# Patient Record
Sex: Male | Born: 1995 | Race: Black or African American | Hispanic: No | Marital: Single | Smoking: Current every day smoker
Health system: Southern US, Community
[De-identification: ages and names within clinical notes are randomized; demographics above are authoritative.]

## PROBLEM LIST (undated history)

## (undated) DIAGNOSIS — F23 Brief psychotic disorder: Secondary | ICD-10-CM

---

## 2013-10-24 ENCOUNTER — Emergency Department (HOSPITAL_COMMUNITY)
Admission: EM | Admit: 2013-10-24 | Discharge: 2013-10-25 | Disposition: A | Discharge status: Other Acute Inpatient Hospital | Payer: Medicaid Other | Attending: Emergency Medicine | Admitting: Emergency Medicine

## 2013-10-24 ENCOUNTER — Encounter (HOSPITAL_COMMUNITY): Payer: Self-pay | Admitting: Emergency Medicine

## 2013-10-24 DIAGNOSIS — F209 Schizophrenia, unspecified: Secondary | ICD-10-CM | POA: Diagnosis not present

## 2013-10-24 DIAGNOSIS — R443 Hallucinations, unspecified: Secondary | ICD-10-CM | POA: Insufficient documentation

## 2013-10-24 DIAGNOSIS — IMO0002 Reserved for concepts with insufficient information to code with codable children: Secondary | ICD-10-CM | POA: Insufficient documentation

## 2013-10-24 DIAGNOSIS — F29 Unspecified psychosis not due to a substance or known physiological condition: Secondary | ICD-10-CM

## 2013-10-24 DIAGNOSIS — Z046 Encounter for general psychiatric examination, requested by authority: Secondary | ICD-10-CM | POA: Diagnosis present

## 2013-10-24 DIAGNOSIS — F911 Conduct disorder, childhood-onset type: Secondary | ICD-10-CM | POA: Diagnosis not present

## 2013-10-24 DIAGNOSIS — F489 Nonpsychotic mental disorder, unspecified: Secondary | ICD-10-CM | POA: Insufficient documentation

## 2013-10-24 DIAGNOSIS — F919 Conduct disorder, unspecified: Secondary | ICD-10-CM | POA: Insufficient documentation

## 2013-10-24 DIAGNOSIS — Z79899 Other long term (current) drug therapy: Secondary | ICD-10-CM | POA: Insufficient documentation

## 2013-10-24 DIAGNOSIS — R4689 Other symptoms and signs involving appearance and behavior: Secondary | ICD-10-CM

## 2013-10-24 HISTORY — DX: Brief psychotic disorder: F23

## 2013-10-24 LAB — RAPID URINE DRUG SCREEN, HOSP PERFORMED
Amphetamines: NOT DETECTED
BARBITURATES: NOT DETECTED
BENZODIAZEPINES: NOT DETECTED
Cocaine: NOT DETECTED
Opiates: NOT DETECTED
Tetrahydrocannabinol: NOT DETECTED

## 2013-10-24 LAB — CBC
HEMATOCRIT: 38 % — AB (ref 39.0–52.0)
Hemoglobin: 13.2 g/dL (ref 13.0–17.0)
MCH: 29.7 pg (ref 26.0–34.0)
MCHC: 34.7 g/dL (ref 30.0–36.0)
MCV: 85.6 fL (ref 78.0–100.0)
Platelets: 222 10*3/uL (ref 150–400)
RBC: 4.44 MIL/uL (ref 4.22–5.81)
RDW: 12.2 % (ref 11.5–15.5)
WBC: 5 10*3/uL (ref 4.0–10.5)

## 2013-10-24 MED ORDER — ZOLPIDEM TARTRATE 5 MG PO TABS: 5.0000 mg | ORAL_TABLET | Freq: Every evening | ORAL | Status: DC | PRN

## 2013-10-24 MED ORDER — ALUM & MAG HYDROXIDE-SIMETH 200-200-20 MG/5ML PO SUSP: 30.0000 mL | ORAL | Status: DC | PRN

## 2013-10-24 MED ORDER — ACETAMINOPHEN 325 MG PO TABS: 650.0000 mg | ORAL_TABLET | ORAL | Status: DC | PRN

## 2013-10-24 MED ORDER — NICOTINE 21 MG/24HR TD PT24: 21.0000 mg | MEDICATED_PATCH | Freq: Every day | TRANSDERMAL | Status: DC

## 2013-10-24 MED ORDER — IBUPROFEN 200 MG PO TABS: 600.0000 mg | ORAL_TABLET | Freq: Three times a day (TID) | ORAL | Status: DC | PRN

## 2013-10-24 MED ORDER — ONDANSETRON HCL 4 MG PO TABS
4.0000 mg | ORAL_TABLET | Freq: Three times a day (TID) | ORAL | Status: DC | PRN
Start: 2013-10-24 — End: 2013-10-25

## 2013-10-24 MED ORDER — LORAZEPAM 1 MG PO TABS: 1.0000 mg | ORAL_TABLET | Freq: Three times a day (TID) | ORAL | Status: DC | PRN
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
## 2013-10-25 ENCOUNTER — Encounter (HOSPITAL_COMMUNITY): Payer: Self-pay | Admitting: *Deleted

## 2013-10-25 ENCOUNTER — Inpatient Hospital Stay (HOSPITAL_COMMUNITY)
Admission: AD | Admit: 2013-10-25 | Discharge: 2013-11-01 | DRG: 885 | Disposition: A | Discharge status: Home / Self Care | Payer: Medicaid Other | Source: Intra-hospital | Attending: Psychiatry | Admitting: Psychiatry

## 2013-10-25 DIAGNOSIS — F3289 Other specified depressive episodes: Secondary | ICD-10-CM | POA: Diagnosis present

## 2013-10-25 DIAGNOSIS — F209 Schizophrenia, unspecified: Secondary | ICD-10-CM | POA: Diagnosis present

## 2013-10-25 DIAGNOSIS — F29 Unspecified psychosis not due to a substance or known physiological condition: Secondary | ICD-10-CM | POA: Diagnosis present

## 2013-10-25 DIAGNOSIS — F911 Conduct disorder, childhood-onset type: Secondary | ICD-10-CM | POA: Diagnosis not present

## 2013-10-25 DIAGNOSIS — F200 Paranoid schizophrenia: Secondary | ICD-10-CM

## 2013-10-25 DIAGNOSIS — G4700 Insomnia, unspecified: Secondary | ICD-10-CM | POA: Diagnosis present

## 2013-10-25 DIAGNOSIS — F329 Major depressive disorder, single episode, unspecified: Secondary | ICD-10-CM | POA: Diagnosis present

## 2013-10-25 LAB — SALICYLATE LEVEL: Salicylate Lvl: <2 mg/dL — ABNORMAL LOW (ref 2.8–20.0)

## 2013-10-25 LAB — COMPREHENSIVE METABOLIC PANEL
ALT: 17 U/L (ref 0–53)
ANION GAP: 13 (ref 5–15)
AST: 26 U/L (ref 0–37)
Albumin: 4.4 g/dL (ref 3.5–5.2)
Alkaline Phosphatase: 71 U/L (ref 39–117)
BILIRUBIN TOTAL: 0.2 mg/dL — AB (ref 0.3–1.2)
BUN: 11 mg/dL (ref 6–23)
CO2: 27 meq/L (ref 19–32)
CREATININE: 0.98 mg/dL (ref 0.50–1.35)
Calcium: 9.6 mg/dL (ref 8.4–10.5)
Chloride: 103 mEq/L (ref 96–112)
GFR calc Af Amer: >90 mL/min (ref >90)
Glucose, Bld: 86 mg/dL (ref 70–99)
Potassium: 3.9 mEq/L (ref 3.7–5.3)
Sodium: 143 mEq/L (ref 137–147)
Total Protein: 8 g/dL (ref 6.0–8.3)

## 2013-10-25 LAB — ACETAMINOPHEN LEVEL

## 2013-10-25 LAB — ETHANOL

## 2013-10-25 MED ORDER — ALUM & MAG HYDROXIDE-SIMETH 200-200-20 MG/5ML PO SUSP: 30.0000 mL | ORAL | Status: DC | PRN

## 2013-10-25 MED ORDER — NICOTINE 21 MG/24HR TD PT24
21.0000 mg | MEDICATED_PATCH | Freq: Every day | TRANSDERMAL | Status: DC
  Filled 2013-10-25 (×5): qty 1

## 2013-10-25 MED ORDER — LORAZEPAM 1 MG PO TABS
1.0000 mg | ORAL_TABLET | Freq: Three times a day (TID) | ORAL | Status: DC | PRN
Start: 2013-10-25 — End: 2013-10-26
  Filled 2013-10-25: qty 1

## 2013-10-25 MED ORDER — MAGNESIUM HYDROXIDE 400 MG/5ML PO SUSP: 30.0000 mL | Freq: Every day | ORAL | Status: DC | PRN

## 2013-10-25 MED ORDER — ACETAMINOPHEN 325 MG PO TABS: 650.0000 mg | ORAL_TABLET | ORAL | Status: DC | PRN

## 2013-10-25 MED ORDER — IBUPROFEN 200 MG PO TABS
600.0000 mg | ORAL_TABLET | Freq: Three times a day (TID) | ORAL | Status: DC | PRN
  Filled 2013-10-25: qty 3

## 2013-10-25 MED ORDER — ONDANSETRON HCL 4 MG PO TABS: 4.0000 mg | ORAL_TABLET | Freq: Three times a day (TID) | ORAL | Status: DC | PRN

## 2013-10-25 MED ORDER — ACETAMINOPHEN 325 MG PO TABS: 650.0000 mg | ORAL_TABLET | Freq: Four times a day (QID) | ORAL | Status: DC | PRN

## 2013-10-25 MED ORDER — ZOLPIDEM TARTRATE 5 MG PO TABS: 5.0000 mg | ORAL_TABLET | Freq: Every evening | ORAL | Status: DC | PRN

## 2013-10-25 MED ORDER — LURASIDONE HCL 40 MG PO TABS
40.0000 mg | ORAL_TABLET | Freq: Every day | ORAL | Status: DC
  Administered 2013-10-25: 40 mg via ORAL
  Filled 2013-10-25 (×2): qty 1

## 2013-10-25 MED ORDER — LURASIDONE HCL 40 MG PO TABS
40.0000 mg | ORAL_TABLET | Freq: Every day | ORAL | Status: DC
  Filled 2013-10-25 (×3): qty 1

## 2013-10-25 NOTE — ED Provider Notes (Signed)
Medical screening examination/treatment/procedure(s) were performed by non-physician practitioner and as supervising physician I was immediately available for consultation/collaboration.   EKG Interpretation None       Derwood KaplanAnkit Auburn Hester, MD 10/25/13 2221
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
## 2013-10-25 NOTE — ED Notes (Signed)
[facility] Expected date:  Expected time:  Means of arrival:  Comments: Hold for Hoffer, T

## 2013-10-25 NOTE — Progress Notes (Signed)
18 year old male pt admitted on involuntary basis. Pt, on admission, does endorse auditory hallucinations and states that he has one voice in his head and it says random stuff to him. Pt does endorse being on medications but did state that he does not feel like they help and also is unable to tell who prescribes him the medication. Pt reports that he lives at home with his mother and will go back there after discharge. Pt denies any alcohol or drug use and denies any SI on admission but able to contract for safety on the unit. Pt was oriented to unit and safety maintained.

## 2013-10-25 NOTE — Tx Team (Signed)
Initial Interdisciplinary Treatment Plan   PATIENT STRESSORS: Medication change or noncompliance   PROBLEM LIST: Problem List/Patient Goals Date to be addressed Date deferred Reason deferred Estimated date of resolution  Auditory hallucinations 10/25/13                                                      DISCHARGE CRITERIA:  Ability to meet basic life and health needs Verbal commitment to aftercare and medication compliance  PRELIMINARY DISCHARGE PLAN: Return to previous living arrangement  PATIENT/FAMIILY INVOLVEMENT: This treatment plan has been presented to and reviewed with the patient, [name] Love, and/or family member, .  The patient and family have been given the opportunity to ask questions and make suggestions.  [name] Love, [name] Love 10/25/2013, 8:36 PM

## 2013-10-25 NOTE — ED Notes (Signed)
[facility]  [facility]

## 2013-10-25 NOTE — ED Notes (Signed)
Patient appears anxious. Standing in corner of room with snack on the floor. Minimal speech and eye contact. Patient answers questions with nods. Denies SI, HI, VH. Patient has passive AH. Patient does not sleep well. Denies feelings of anxiety and depression.   Encouragement offered. Offered patient use of his bed, chair and bedside table. Patient watching tv, continues to stand in the corner with snack on the floor.  Q 15 safety checks in place.

## 2013-10-25 NOTE — BH Assessment (Signed)
10-25-2013  Dr. Sharyon Cableaylor/Conrad, NP recommends in patient treatment. [facility]

## 2013-10-25 NOTE — ED Notes (Signed)
[facility] to [employer] [facility].  [facility].
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
## 2013-10-26 ENCOUNTER — Encounter (HOSPITAL_COMMUNITY): Payer: Self-pay | Admitting: Psychiatry

## 2013-10-26 MED ORDER — HALOPERIDOL 5 MG PO TABS: 5.0000 mg | ORAL_TABLET | Freq: Two times a day (BID) | ORAL | Status: DC

## 2013-10-26 MED ORDER — HALOPERIDOL LACTATE 5 MG/ML IJ SOLN
5.0000 mg | Freq: Four times a day (QID) | INTRAMUSCULAR | Status: DC | PRN
  Filled 2013-10-26: qty 1

## 2013-10-26 MED ORDER — DIPHENHYDRAMINE HCL 25 MG PO CAPS
25.0000 mg | ORAL_CAPSULE | Freq: Four times a day (QID) | ORAL | Status: DC | PRN
  Filled 2013-10-26: qty 1

## 2013-10-26 MED ORDER — LORAZEPAM 1 MG PO TABS
1.0000 mg | ORAL_TABLET | Freq: Four times a day (QID) | ORAL | Status: DC | PRN
  Administered 2013-10-28: 1 mg via ORAL
  Filled 2013-10-26: qty 1

## 2013-10-26 MED ORDER — DIPHENHYDRAMINE HCL 50 MG/ML IJ SOLN: 25.0000 mg | Freq: Two times a day (BID) | INTRAMUSCULAR | Status: DC

## 2013-10-26 MED ORDER — DIPHENHYDRAMINE HCL 25 MG PO CAPS
25.0000 mg | ORAL_CAPSULE | Freq: Two times a day (BID) | ORAL | Status: DC
  Administered 2013-10-26 – 2013-11-01 (×12): 25 mg via ORAL
  Filled 2013-10-26: qty 1
  Filled 2013-10-26: qty 28
  Filled 2013-10-26 (×10): qty 1
  Filled 2013-10-26: qty 28
  Filled 2013-10-26 (×3): qty 1

## 2013-10-26 MED ORDER — OLANZAPINE 5 MG PO TBDP
5.0000 mg | ORAL_TABLET | Freq: Two times a day (BID) | ORAL | Status: DC
  Filled 2013-10-26 (×2): qty 1

## 2013-10-26 MED ORDER — HALOPERIDOL 5 MG PO TABS
5.0000 mg | ORAL_TABLET | Freq: Two times a day (BID) | ORAL | Status: DC
  Administered 2013-10-26 – 2013-11-01 (×12): 5 mg via ORAL
  Filled 2013-10-26: qty 1
  Filled 2013-10-26: qty 28
  Filled 2013-10-26 (×2): qty 1
  Filled 2013-10-26: qty 28
  Filled 2013-10-26 (×14): qty 1

## 2013-10-26 MED ORDER — HALOPERIDOL LACTATE 5 MG/ML IJ SOLN
5.0000 mg | Freq: Two times a day (BID) | INTRAMUSCULAR | Status: DC
  Filled 2013-10-26 (×17): qty 1

## 2013-10-26 MED ORDER — DIPHENHYDRAMINE HCL 50 MG/ML IJ SOLN
25.0000 mg | Freq: Two times a day (BID) | INTRAMUSCULAR | Status: DC
  Filled 2013-10-26 (×16): qty 0.5

## 2013-10-26 MED ORDER — HALOPERIDOL LACTATE 5 MG/ML IJ SOLN: 5.0000 mg | Freq: Three times a day (TID) | INTRAMUSCULAR | Status: DC | PRN

## 2013-10-26 MED ORDER — OLANZAPINE 10 MG IM SOLR
5.0000 mg | Freq: Two times a day (BID) | INTRAMUSCULAR | Status: DC
  Filled 2013-10-26 (×2): qty 10

## 2013-10-26 MED ORDER — HALOPERIDOL 5 MG PO TABS
5.0000 mg | ORAL_TABLET | Freq: Four times a day (QID) | ORAL | Status: DC | PRN
  Administered 2013-10-26 – 2013-10-28 (×5): 5 mg via ORAL
  Filled 2013-10-26 (×2): qty 1

## 2013-10-26 MED ORDER — LORAZEPAM 2 MG/ML IJ SOLN: 1.0000 mg | Freq: Four times a day (QID) | INTRAMUSCULAR | Status: DC | PRN

## 2013-10-26 MED ORDER — DIPHENHYDRAMINE HCL 50 MG/ML IJ SOLN
25.0000 mg | Freq: Four times a day (QID) | INTRAMUSCULAR | Status: DC | PRN
  Filled 2013-10-26: qty 1

## 2013-10-26 MED ORDER — DIPHENHYDRAMINE HCL 25 MG PO CAPS: 25.0000 mg | ORAL_CAPSULE | Freq: Two times a day (BID) | ORAL | Status: DC

## 2013-10-26 MED ORDER — HALOPERIDOL LACTATE 5 MG/ML IJ SOLN: 5.0000 mg | Freq: Two times a day (BID) | INTRAMUSCULAR | Status: DC

## 2013-10-26 MED ORDER — OLANZAPINE 10 MG IM SOLR: 2.5000 mg | Freq: Two times a day (BID) | INTRAMUSCULAR | Status: DC | PRN

## 2013-10-26 MED ORDER — OLANZAPINE 10 MG PO TBDP: 5.0000 mg | ORAL_TABLET | Freq: Two times a day (BID) | ORAL | Status: DC | PRN

## 2013-10-26 MED ORDER — TRAZODONE HCL 50 MG PO TABS
50.0000 mg | ORAL_TABLET | Freq: Every day | ORAL | Status: DC
  Administered 2013-10-27 – 2013-10-29 (×3): 50 mg via ORAL
  Filled 2013-10-26 (×9): qty 1

## 2013-10-26 MED ORDER — HALOPERIDOL 5 MG PO TABS: 5.0000 mg | ORAL_TABLET | Freq: Three times a day (TID) | ORAL | Status: DC | PRN

## 2013-10-26 NOTE — Progress Notes (Signed)
Psychoeducational Group Note  Date:  10/26/2013 Time:  2000  Group Topic/Focus:  Wrap-Up Group:   The focus of this group is to help patients review their daily goal of treatment and discuss progress on daily workbooks.  Participation Level: Did Not Attend  Participation Quality:  Not Applicable  Affect:  Not Applicable  Cognitive:  Not Applicable  Insight:  Not Applicable  Engagement in Group: Not Applicable  Additional Comments:  The patient did not attend since he was lying awake in his bed.   Hazle CocaGOODMAN, Kendall Arnell S 10/26/2013, 10:27 PM

## 2013-10-26 NOTE — Progress Notes (Signed)
Patient ID: [name] Love, male   DOB: Jul 29, 1995, 18 y.o.   MRN: [id number] D. Patient presents with flat affect, he is very guarded with wide eyed stare and appears very preoccupied, responding to internal stimuli. [name] Love would not answer any questions with words today with writer, only shaking his head. When writer offered am medications he refused. He has been noted to pace in the hallway, appearing scared and staring into the distance. A. Notified Dr. [name] and treatment team of above information. Orders received for medications, and patient did accept with moderate staff encouragement. It was noted that during med administration pt attempted to cheek pills several times. R. Patient has been isolative and guarded but is in no acute distress at this time. No further voiced concerns. Will continue to monitor q 15 minutes for safety.

## 2013-10-26 NOTE — [facility] Group Notes (Signed)
[facility]   10/26/2013 10:34 AM  Participation Quality:  Did not attend    [place], Nyomie Ehrlich B

## 2013-10-26 NOTE — [facility] Group Notes (Signed)
[facility] LCSW Group Therapy  10/26/2013  1:05 PM  Type of Therapy:  Group therapy  Participation Level:  Invited  Chose to not attend  Summary of Progress/Problems:  Chaplain was here to lead a group on themes of hope and courage.  Daryel Geraldorth, Leonore Frankson B 10/26/2013 1:31 PM

## 2013-10-26 NOTE — Progress Notes (Signed)
Pt did not attend group this evening.  
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
## 2013-10-26 NOTE — [facility] Suicide Risk Assessment (Signed)
   Nursing information obtained from:   chart review and staff Demographic factors:   Patient is a 18 year old AAM,who is single ,reportedly has been living with mother and siblings ,was brought in for bizarre behavior as well as talking in third person. Has a hx of schizophrenia. Current Mental Status:   Patient appears to be disorganized ,exhibiting bizarre gestures as well as inappropriate laughter. Patient refuses to talk or explain his behavior and is not cooperative with evaluation or treatment. Loss Factors:   unknown Historical Factors:   History of schizophrenia,possible non compliance on medications.(Will need to obtain collateral information from mother -unable to reach her at this time) Risk Reduction Factors:   Access to treatment as well as social support. Total Time spent with patient: 1 hour  CLINICAL FACTORS:   Schizophrenia:   Less than 20 years old  Psychiatric Specialty Exam: Physical Exam  ROS  Blood pressure 124/59, pulse 79, temperature 98 F (36.7 C), temperature source Oral, resp. rate 16, height 5\' 10"  (1.778 m), weight 71.668 kg (158 lb).Body mass index is 22.67 kg/(m^2).  General Appearance: Disheveled  Eye Contact::  appears to be staring at times  Speech:  Slow and sparse,non spontaneous,speaks in monosyllables  Volume:  Decreased  Mood:  Dysphoric  Affect:  Inappropriate and Labile  Thought Process:  Disorganized, Irrelevant and has inappropriate gestures of his hands as well as staring,inappropiriate laughter as well as bizarre behavior  Orientation:  Full (Time, Place, and Person)  Thought Content:  Hallucinations: responding to internal stimuli,disorganized  Suicidal Thoughts:  No  Homicidal Thoughts:  No  Memory:  Immediate;   Poor Recent;   Fair Remote;   Poor  Judgement:  Impaired  Insight:  Lacking  Psychomotor Activity:  Decreased  Concentration:  Poor  Recall:  Poor  Fund of Knowledge:Poor  Language: Fair  Akathisia:  No    AIMS (if  indicated):   0  Assets:  Social Support  Sleep:  Number of Hours: 6.75   Musculoskeletal: Strength & Muscle Tone: within normal limits Gait & Station: normal Patient leans: N/A  COGNITIVE FEATURES THAT CONTRIBUTE TO RISK:  Closed-mindedness Thought constriction (tunnel vision)    SUICIDE RISK:   Minimal:  Denies Suicidal ideation. There are no identifiable plans, no associated intent, mild dysphoria and related symptoms, good self-control (both objective and subjective assessment), few other risk factors, and identifiable protective factors, including available and accessible social support.  PLAN OF CARE:  I certify that inpatient services furnished can reasonably be expected to improve the patient's condition.  Nesiah Jump 10/26/2013, 11:52 AM

## 2013-10-26 NOTE — H&P (Signed)
Psychiatric Admission Assessment Adult  Patient Identification:  [name] Love Date of Evaluation:  10/26/2013 Chief Complaint:  "I am here for behavior".  History of Present Illness: Patient is a 34 y old AAM, who is single, high school graduate ,reportedly lives with mother as well as siblings,unemployed ,was brought in IVC ed for bizarre behavior as well as talking in 72rd person. Patient per IVC documentation has a history of schizophrenia , was on latuda 20 mg ,but became disorganized and hence had to be brought in. Patient denies any AH/VH ,reports he is here for behavior problems. He is unable to give details about who brought him here and says it is Agricultural engineer . Patient throughout the interview appeared to be inappropriate ,found staring at times,had bizarre gestures of his hands and was unable to answer questions being asked, laughing at times for no reason, avoiding eye contact. Patient reports being a high school graduate and wanting to go to college to be a Brewing technologist. Reports he has never been hospitalizaed before but is unreliable with detailed past history. Per collateral obtained from mother (Per report from [place]) - Patient was in his usual state of health ,when three weeks ago he was found to be disorganized ,not making any sense and he was taken to the mental health center where he was started on latuda as well as given a diagnosis of schizophrenia. 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   Elements:  Location:  psychosis. Quality:  bizare gestures,inappropriate laughter ,disorganized ,behavioral issues. Severity:  severe . Timing:  past 3 weeks ,all day. Duration:  past 3 weeks.. Associated Signs/Synptoms: Depression Symptoms:  patient denies (Hypo) Manic Symptoms:none Anxiety Symptoms:  Patient denies Psychotic Symptoms:  Hallucinations: responds to internal stimuli,staring ,bizarre  gestures,inappropriate laughter Paranoia, PTSD Symptoms: Negative Total Time spent with patient: 1 hour  Psychiatric Specialty Exam: Physical Exam  Constitutional: He appears well-developed and well-nourished.  HENT:  Head: Normocephalic and atraumatic.  Eyes: Conjunctivae and EOM are normal. Pupils are equal, round, and reactive to light.  Neck: Normal range of motion. Neck supple.  Cardiovascular: Normal rate and regular rhythm.   Respiratory: Effort normal and breath sounds normal.  GI: Soft. Bowel sounds are normal.  Musculoskeletal: Normal range of motion.  Neurological: He is alert.  Skin: Skin is warm.  Psychiatric: His affect is labile and inappropriate. His speech is delayed. He is slowed and withdrawn. Thought content is paranoid. Cognition and memory are impaired. He expresses inappropriate judgment. He is noncommunicative.    Review of Systems  Constitutional: Negative.   HENT: Negative.   Eyes: Negative.   Gastrointestinal: Negative.   Neurological: Negative.   Psychiatric/Behavioral: Positive for hallucinations (responding to internal stimuli,abnormal bizarre gestures,staring,inappropriate laughter).    Blood pressure 124/59, pulse 79, temperature 98 F (36.7 C), temperature source Oral, resp. rate 16, height 5' 10"  (1.778 m), weight 71.668 kg (158 lb).Body mass index is 22.67 kg/(m^2).  General Appearance: Disheveled and Guarded  Eye Contact::  Poor  Speech:  Blocked and Slow  Volume:  Decreased  Mood:  Dysphoric  Affect:  Inappropriate and Labile  Thought Process:  Disorganized and Irrelevant  Orientation:  Full (Time, Place, and Person)  Thought Content:  Hallucinations: responds to internal stimuli and Paranoid Ideation  Suicidal Thoughts:  No  Homicidal Thoughts:  No  Memory:  Immediate;   Poor Recent;   Fair Remote;   Poor  Judgement:  Impaired  Insight:  Lacking  Psychomotor Activity:  Decreased  Concentration:  Poor  Recall:  Poor  Fund of  Knowledge:Fair  Language: Fair  Akathisia:  No    AIMS (if indicated):     Assets:  Social Support  Sleep:  Number of Hours: 6.75    Musculoskeletal: Strength & Muscle Tone: within normal limits Gait & Station: normal Patient leans: N/A  Past Psychiatric History: Diagnosis:Psychosis (diagnosed with schizophrenia 3 weeks ago per mom). No past history.  Hospitalizations:none  Outpatient Care:mental health center -started on latuda   Substance Abuse Care:none  Self-Mutilation:denies  Suicidal Attempts:denies  Violent Behaviors:denies   Past Medical History:   Past Medical History  Diagnosis Date  . Schizophrenia, acute    None. Allergies:  No Known Allergies PTA Medications: Prescriptions prior to admission  Medication Sig Dispense Refill  . lurasidone (LATUDA) 40 MG TABS tablet Take 20 mg by mouth daily with breakfast.        Previous Psychotropic Medications:  Medication/Dose  latuda 40 mg -non compliant               Substance Abuse History in the last 12 months:  No.  Consequences of Substance Abuse: NA  Social History:  reports that he has never smoked. He does not have any smokeless tobacco history on file. He reports that he does not drink alcohol or use illicit drugs. Additional Social History:                   Current Place of Residence:  Lives with mother and siblings Family Members:Mother and 2 siblings Marital Status:  Single Children:  Sons:none  Daughters:none Relationships: Education:  HS Graduate Religious Beliefs/Practices:unknown History of Abuse (Emotional/Phsycial/Sexual)-denies Passenger transport manager History:  None. Legal History:none Hobbies/Interests:denies  Family History:   Family History  Problem Relation Age of Onset  . Family history unknown: Yes    Results for orders placed during the hospital encounter of 10/24/13 (from the past 72 hour(s))  URINE RAPID DRUG SCREEN (HOSP PERFORMED)      Status: None   Collection Time    10/24/13 11:11 PM      Result Value Ref Range   Opiates NONE DETECTED  NONE DETECTED   Cocaine NONE DETECTED  NONE DETECTED   Benzodiazepines NONE DETECTED  NONE DETECTED   Amphetamines NONE DETECTED  NONE DETECTED   Tetrahydrocannabinol NONE DETECTED  NONE DETECTED   Barbiturates NONE DETECTED  NONE DETECTED   Comment:            DRUG SCREEN FOR MEDICAL PURPOSES     ONLY.  IF CONFIRMATION IS NEEDED     FOR ANY PURPOSE, NOTIFY LAB     WITHIN 5 DAYS.                LOWEST DETECTABLE LIMITS     FOR URINE DRUG SCREEN     Drug Class       Cutoff (ng/mL)     Amphetamine      1000     Barbiturate      200     Benzodiazepine   916     Tricyclics       606     Opiates          300     Cocaine          300     THC  [place]     Status: None   Collection Time    10/24/13 11:16 PM      Result Value Ref Range   Acetaminophen (Tylenol), Serum <15.0  10 - 30 ug/mL   Comment:            THERAPEUTIC CONCENTRATIONS VARY     SIGNIFICANTLY. A RANGE OF 10-30     ug/mL MAY BE AN EFFECTIVE     CONCENTRATION FOR MANY PATIENTS.     HOWEVER, SOME ARE [place] TREATED     AT CONCENTRATIONS OUTSIDE THIS     RANGE.     ACETAMINOPHEN CONCENTRATIONS     >150 ug/mL AT 4 HOURS AFTER     INGESTION AND >50 ug/mL AT 12     HOURS AFTER INGESTION ARE     OFTEN ASSOCIATED WITH TOXIC     REACTIONS.  CBC     Status: Abnormal   Collection Time    10/24/13 11:16 PM      Result Value Ref Range   WBC 5.0  4.0 - 10.5 K/uL   RBC 4.44  4.22 - 5.81 MIL/uL   Hemoglobin 13.2  13.0 - 17.0 g/dL   HCT 38.0 (*) 39.0 - 52.0 %   MCV 85.6  78.0 - 100.0 fL   MCH 29.7  26.0 - 34.0 pg   MCHC 34.7  30.0 - 36.0 g/dL   RDW 12.2  11.5 - 15.5 %   Platelets 222  150 - 400 K/uL  COMPREHENSIVE METABOLIC PANEL     Status: Abnormal   Collection Time    10/24/13 11:16 PM      Result Value Ref Range   Sodium 143  137 - 147 mEq/L   Potassium 3.9  3.7 - 5.3 mEq/L    Chloride 103  96 - 112 mEq/L   CO2 27  19 - 32 mEq/L   Glucose, Bld 86  70 - 99 mg/dL   BUN 11  6 - 23 mg/dL   Creatinine, Ser 0.98  0.50 - 1.35 mg/dL   Calcium 9.6  8.4 - 10.5 mg/dL   Total Protein 8.0  6.0 - 8.3 g/dL   Albumin 4.4  3.5 - 5.2 g/dL   AST 26  0 - 37 U/L   ALT 17  0 - 53 U/L   Alkaline Phosphatase 71  39 - 117 U/L   Total Bilirubin 0.2 (*) 0.3 - 1.2 mg/dL   GFR calc non Af Amer >90  >90 mL/min   GFR calc Af Amer >90  >90 mL/min   Comment: (NOTE)     The eGFR has been calculated using the CKD EPI equation.     This calculation has not been validated in all clinical situations.     eGFR's persistently <90 mL/min signify possible Chronic Kidney     Disease.   Anion gap 13  5 - 15  ETHANOL     Status: None   Collection Time    10/24/13 11:16 PM      Result Value Ref Range   Alcohol, Ethyl (B) <11  0 - 11 mg/dL   Comment:            LOWEST DETECTABLE LIMIT FOR     SERUM ALCOHOL IS 11 mg/dL     FOR MEDICAL PURPOSES ONLY  SALICYLATE LEVEL     Status: Abnormal   Collection Time    10/24/13 11:16 PM      Result Value Ref  Range   Salicylate Lvl <3.7 (*) 2.8 - 20.0 mg/dL   Psychological Evaluations:  Assessment:   DSM5: Primary psychiatric diagnosis Schizophrenia Disorders:  Unspecified schizophrenia spectrum and other psychotic disorder  Non -Psychiatric diagnosis; None  Treatment Plan/Recommendations:  Review of chart, vital signs, medications, and notes. 1-Inpatient admission for  crisis management and stabilization.  Estimated length of stay 5-7 days. 2-Individual and group therapy encouraged 3-Medication management for psychosis . 4-Coping skills needs to be worked on [employer] crisis stabilization and management 6-Address health issues--monitoring vital signs, stable  7-Treatment plan in progress . 8-Psychosocial education regarding relapse prevention and self-care 8-Health care follow up as needed for any health concerns  9-Call for consult with  hospitalist for additional specialty patient services as needed.   - Will start the patient on haldol 5 mg po bid for psychosis. Patient on [place] (second opinion in chart ). -Will start benadryl po/IM as well for side effects of Haldol. -Will make available prn's for agitation. -Will plan on getting a CT scan -since this is his first psychotic break. -Patient to participate on therapeutic milieu. -CSW will work on disposition.   Treatment Plan Summary: Daily contact with patient to assess and evaluate symptoms and progress in treatment Medication management Current Medications:  Current Facility-Administered Medications  Medication Dose Route Frequency Provider Last Rate Last Dose  . acetaminophen (TYLENOL) tablet 650 mg  650 mg Oral Q4H PRN Clarene Reamer, MD      . acetaminophen (TYLENOL) tablet 650 mg  650 mg Oral Q6H PRN Clarene Reamer, MD      . alum & mag hydroxide-simeth (MAALOX/MYLANTA) 200-200-20 MG/5ML suspension 30 mL  30 mL Oral PRN Clarene Reamer, MD      . alum & mag hydroxide-simeth (MAALOX/MYLANTA) 200-200-20 MG/5ML suspension 30 mL  30 mL Oral Q4H PRN Clarene Reamer, MD      . diphenhydrAMINE (BENADRYL) capsule 25 mg  25 mg Oral BID Ursula Alert, MD       Or  . diphenhydrAMINE (BENADRYL) injection 25 mg  25 mg Intramuscular BID Ursula Alert, MD      . diphenhydrAMINE (BENADRYL) capsule 25 mg  25 mg Oral Q6H PRN Ursula Alert, MD       Or  . diphenhydrAMINE (BENADRYL) injection 25 mg  25 mg Intramuscular Q6H PRN Reba Hulett, MD      . haloperidol (HALDOL) tablet 5 mg  5 mg Oral BID Ursula Alert, MD       Or  . haloperidol lactate (HALDOL) injection 5 mg  5 mg Intramuscular BID Halei Hanover, MD      . haloperidol (HALDOL) tablet 5 mg  5 mg Oral Q8H PRN Ursula Alert, MD       Or  . haloperidol lactate (HALDOL) injection 5 mg  5 mg Intramuscular Q8H PRN Reymond Maynez, MD      . ibuprofen (ADVIL,MOTRIN) tablet 600 mg  600 mg Oral Q8H  PRN Clarene Reamer, MD      . LORazepam (ATIVAN) tablet 1 mg  1 mg Oral Q6H PRN Ursula Alert, MD       Or  . LORazepam (ATIVAN) injection 1 mg  1 mg Intramuscular Q6H PRN Kyria Bumgardner, MD      . magnesium hydroxide (MILK OF MAGNESIA) suspension 30 mL  30 mL Oral Daily PRN Clarene Reamer, MD      . nicotine (NICODERM CQ - dosed in mg/24 hours) patch 21 mg  21  mg Transdermal Daily Clarene Reamer, MD      . ondansetron [facility]) tablet 4 mg  4 mg Oral Q8H PRN Clarene Reamer, MD      . traZODone (DESYREL) tablet 50 mg  50 mg Oral QHS Ursula Alert, MD        Observation Level/Precautions:  15 minute checks  Laboratory:  CT scan head               I certify that inpatient services furnished can reasonably be expected to improve the patient's condition.   Zelene Barga 8/14/20151:14 PM

## 2013-10-27 DIAGNOSIS — F29 Unspecified psychosis not due to a substance or known physiological condition: Secondary | ICD-10-CM

## 2013-10-27 NOTE — [facility] Group Notes (Signed)
[facility] Group Notes:  (Clinical Social Work)  10/27/2013  11:15AM - 12:00PM  Summary of Progress/Problems:   The main focus of today's process group was to discuss feelings related to being hospitalized, as well as the difference between "being" and "having" a mental health diagnosis.  It was agreed in general by the group that it would be preferable to avoid future hospitalizations, and we discussed means of doing that.  As a follow-up, problems with adhering to medication recommendations were discussed.  The patient expressed that he is somewhat nervous about being in the hospital, because he does not really know what is going on, is on unfamiliar ground.  He was alert throughout group, stared at group leader without expression throughout, but did respond to questions when asked.  Type of Therapy:  Group Therapy - Process  Participation Level:  Active  Participation Quality:  Attentive  Affect:  Blunted and Staring  Cognitive:  Alert  Insight:  Developing/Improving  Engagement in Therapy:  Developing/Improving  Modes of Intervention:  Exploration, Discussion  Ambrose MantleMareida Grossman-Orr, LCSW 10/27/2013, 12:30

## 2013-10-27 NOTE — [facility] Counselor (Signed)
Adult Comprehensive Assessment  Patient ID: [name] Love, male   DOB: 04/03/1995, 18 y.o.   MRN: [id number]  Information Source: Information source: Patient (Patient denies all stressors)  Current Stressors:     Living/Environment/Situation:  Living Arrangements: Parent;Other relatives (Mother and 15yo sister and 17yo brother) Living conditions (as described by patient or guardian): Has his own room, lives in an apartment. How long has patient lived in current situation?: His whole life What is atmosphere in current home: Loving;Comfortable;Supportive  Family History:  Marital status: Single Does patient have children?: No  Childhood History:  By whom was/is the patient raised?: Mother Additional childhood history information: Does not know who his father is. Description of patient's relationship with caregiver when they were a child: Good Patient's description of current relationship with people who raised him/her: Pretty good relationship with his mother.  Finds her to be supportive.   Does patient have siblings?: Yes Number of Siblings: 2 (15yo sister, 17yo brother) Description of patient's current relationship with siblings: They all get along well. Did patient suffer any verbal/emotional/physical/sexual abuse as a child?: No Did patient suffer from severe childhood neglect?: No Has patient ever been sexually abused/assaulted/raped as an adolescent or adult?: No Was the patient ever a victim of a crime or a disaster?: No Witnessed domestic violence?: No Has patient been effected by domestic violence as an adult?: No  Education:  Highest grade of school patient has completed: 12th Currently a student?: Yes Name of school: Not sure How long has the patient attended?: Thinks he is getting ready to start school Learning disability?: No  Employment/Work Situation:   Employment situation: Unemployed What is the longest time patient has a held a job?: a few months Where was  the patient employed at that time?: factory Has patient ever been in the [employer]?: No Has patient ever served in Buyer, retail?: No  Financial Resources:   Surveyor, quantity resources: Support from parents / caregiver  Alcohol/Substance Abuse:   What has been your use of drugs/alcohol within the last 12 months?: None If attempted suicide, did drugs/alcohol play a role in this?: No Alcohol/Substance Abuse Treatment Hx: Denies past history Has alcohol/substance abuse ever caused legal problems?: No  Social Support System:   Conservation officer, nature Support System: Good Describe Community Support System: Mother, sister, brother Type of faith/religion: None How does patient's faith help to cope with current illness?: N/A  Leisure/Recreation:   Leisure and Hobbies: Listens to music, "that's about it."  Strengths/Needs:   What things does the patient do well?: "Anything really" In what areas does patient struggle / problems for patient: "Nowhere."  Discharge Plan:   Does patient have access to transportation?: Yes Will patient be returning to same living situation after discharge?: Yes Currently receiving community mental health services: No If no, would patient like referral for services when discharged?: No Does patient have financial barriers related to discharge medications?: Yes Patient description of barriers related to discharge medications: "It would be up to my mom."  Summary/Recommendations:   Summary and Recommendations (to be completed by the evaluator): This is a 18yo African-American male who was hospitalized with a recent diagnosis of Schizophrenia, having disorganized behavior, auditory hallucinations, and aggressive behavior toward his family.  His answers are very short to questions in this assessment.  He denies having any current mental health providers or wanting referrals.  He does not know if discharge medications will be an issue financially, and says that will be up to his mother.   He would  benefit from safety monitoring, medication evaluation, psychoeducation, group therapy, and discharge planning to link with ongoing resources.   Sarina SerGrossman-Orr, Odie Rauen Jo. 10/27/2013

## 2013-10-27 NOTE — Progress Notes (Signed)
Patient ID: [name] Love, male   DOB: March 10, 1996, 18 y.o.   MRN: [id number] Psychoeducational Group Note  Date:  10/27/2013 Time:0900am  Group Topic/Focus:  Identifying Needs:   The focus of this group is to help patients identify their personal needs that have been historically problematic and identify healthy behaviors to address their needs.  Participation Level:  Minimal  Participation Quality:  Redirectable  Affect:  Depressed  Cognitive:  Disorganized  Insight:  Lacking  Engagement in Group:  Lacking  Additional Comments:  Inventory group   Valente DavidWeaver, Sherisa Gilvin Brooks 10/27/2013,11:01 AM

## 2013-10-27 NOTE — Progress Notes (Signed)
[facility] MD Progress Note  10/27/2013 2:42 PM Gillie Gadberry  MRN:  [id number]  Subjective: Haynes reports, "I have no either why I'm here"  Objective:  Patient is seen and chart is reviewed. Zyrion was assessed while lying down in his bed. He is verbally responsive in a soft voice. His affect is flat, and yet restricted. He says he does not no why he was brought to the hospital. His nurse reports that he is isolating himself in his room, hesitant about taking his medications and at times pacing forth and back within the unit. Chia appears to be responding to some internal stimuli, but denies any visual/auditory hallucinations. He wants to know when he mother will be coming to take him home. He is currently in no apparent distress.  Diagnosis:   DSM5: Schizophrenia Disorders:  Psychotic disorder Obsessive-Compulsive Disorders:  NA Trauma-Stressor Disorders:  NA Substance/Addictive Disorders:  NA Depressive Disorders:  NA Total Time spent with patient: 35 minutes  Axis I: Psychotic disorder Axis II: Deferred Axis III:  Past Medical History  Diagnosis Date  . Schizophrenia, acute    Axis IV: other psychosocial or environmental problems and mental illness, chronic Axis V: 21-30 behavior considerably influenced by delusions or hallucinations OR serious impairment in judgment, communication OR inability to function in almost all areas  ADL's:  Fair  Sleep: Good  Appetite:  Fair  Suicidal Ideation:  Plan:  Denies Intent:  Denies Means:  Denies Homicidal Ideation:  Plan:  Denies Intent:  Denies Means:  Denies AEB (as evidenced by):  Psychiatric Specialty Exam: Physical Exam  Review of Systems  Constitutional: Negative.   HENT: Negative.   Eyes: Negative.   Respiratory: Negative.   Cardiovascular: Negative.   Genitourinary: Negative.   Musculoskeletal: Negative.   Skin: Negative.   Neurological: Negative.   Endo/Heme/Allergies: Negative.   Psychiatric/Behavioral: Positive  for hallucinations. Negative for depression, suicidal ideas, memory loss and substance abuse. The patient has insomnia. The patient is not nervous/anxious.   All other systems reviewed and are negative.   Blood pressure 112/63, pulse 92, temperature 98.3 F (36.8 C), temperature source Oral, resp. rate 16, height 5\' 10"  (1.778 m), weight 71.668 kg (158 lb).Body mass index is 22.67 kg/(m^2).  General Appearance: Guarded  Eye Contact::  Fair  Speech:  Clear and Coherent and Normal Rate  Volume:  Decreased  Mood:  Depressed  Affect:  Restricted  Thought Process:  Disorganized  Orientation:  Full (Time, Place, and Person)  Thought Content:  Hallucinations: Auditory  Suicidal Thoughts:  No  Homicidal Thoughts:  No  Memory:  Immediate;   Fair Recent;   Fair Remote;   Poor  Judgement:  Impaired  Insight:  Lacking  Psychomotor Activity:  Decreased  Concentration:  Fair  Recall:  [employer] of Knowledge:Poor  Language: Fair  Akathisia:  No  Handed:  Right  AIMS (if indicated):     Assets:  Desire for Improvement  Sleep:  Number of Hours: 6.75   Musculoskeletal: Strength & Muscle Tone: within normal limits Gait & Station: normal Patient leans: N/A  Current Medications: Current Facility-Administered Medications  Medication Dose Route Frequency Provider Last Rate Last Dose  . acetaminophen (TYLENOL) tablet 650 mg  650 mg Oral Q4H PRN Benjaman Pott, MD      . acetaminophen (TYLENOL) tablet 650 mg  650 mg Oral Q6H PRN Benjaman Pott, MD      . alum & mag hydroxide-simeth (MAALOX/MYLANTA) 200-200-20 MG/5ML suspension 30 mL  30 mL Oral PRN Benjaman Pott, MD      . alum & mag hydroxide-simeth (MAALOX/MYLANTA) 200-200-20 MG/5ML suspension 30 mL  30 mL Oral Q4H PRN Benjaman Pott, MD      . diphenhydrAMINE (BENADRYL) capsule 25 mg  25 mg Oral BID Jomarie Longs, MD   25 mg at 10/27/13 0857   Or  . diphenhydrAMINE (BENADRYL) injection 25 mg  25 mg Intramuscular BID Jomarie Longs, MD       . diphenhydrAMINE (BENADRYL) capsule 25 mg  25 mg Oral Q6H PRN Jomarie Longs, MD       Or  . diphenhydrAMINE (BENADRYL) injection 25 mg  25 mg Intramuscular Q6H PRN Jomarie Longs, MD      . haloperidol (HALDOL) tablet 5 mg  5 mg Oral BID Jomarie Longs, MD   5 mg at 10/27/13 0857   Or  . haloperidol lactate (HALDOL) injection 5 mg  5 mg Intramuscular BID Jomarie Longs, MD      . haloperidol (HALDOL) tablet 5 mg  5 mg Oral Q6H PRN Jomarie Longs, MD   5 mg at 10/27/13 0857   Or  . haloperidol lactate (HALDOL) injection 5 mg  5 mg Intramuscular Q6H PRN Jomarie Longs, MD      . ibuprofen (ADVIL,MOTRIN) tablet 600 mg  600 mg Oral Q8H PRN Benjaman Pott, MD      . LORazepam (ATIVAN) tablet 1 mg  1 mg Oral Q6H PRN Jomarie Longs, MD       Or  . LORazepam (ATIVAN) injection 1 mg  1 mg Intramuscular Q6H PRN Jomarie Longs, MD      . magnesium hydroxide (MILK OF MAGNESIA) suspension 30 mL  30 mL Oral Daily PRN Benjaman Pott, MD      . nicotine (NICODERM CQ - dosed in mg/24 hours) patch 21 mg  21 mg Transdermal Daily Benjaman Pott, MD      . ondansetron [facility]) tablet 4 mg  4 mg Oral Q8H PRN Benjaman Pott, MD      . traZODone (DESYREL) tablet 50 mg  50 mg Oral QHS Jomarie Longs, MD        Lab Results: No results found for this or any previous visit (from the past 48 hour(s)).  Physical Findings: AIMS: Facial and Oral Movements Muscles of Facial Expression: None, normal Lips and Perioral Area: None, normal Jaw: None, normal Tongue: None, normal,Extremity Movements Upper (arms, wrists, hands, fingers): None, normal Lower (legs, knees, ankles, toes): None, normal, Trunk Movements Neck, shoulders, hips: None, normal, Overall Severity Severity of abnormal movements (highest score from questions above): None, normal Incapacitation due to abnormal movements: None, normal Patient's awareness of abnormal movements (rate only patient's report): No Awareness, Dental Status Current  problems with teeth and/or dentures?: No Does patient usually wear dentures?: No  CIWA:    COWS:     Treatment Plan Summary: Daily contact with patient to assess and evaluate symptoms and progress in treatment Medication management  Plan: 1. Continue crisis management, mood stabilization & relapse prevention.. 2. Continue current medication management to reduce current symptoms to base line and improve the  patient's overall level of functioning; Continue Benedryl 25 mg for EPS, Haldol 5 mg for mood control, Lorazepam  1 mg for agitation & Trazodone 50 mg for insomnia. 3. Treat health problems if indicated. 4. Develop treatment plan to decrease risk of exacerbation of symptoms  after discharge and the need for  readmission. 5. Psycho-social education regarding  self care.  Medical Decision Making Problem Points:  Established problem, stable/improving (1), Review of last therapy session (1) and Review of psycho-social stressors (1)  Data Points:  Review of medication regiment & side effects (2) Review of new medications or change in dosage (2)  I certify that inpatient services furnished can reasonably be expected to improve the patient's condition.   Sanjuana Kavawoko, Agnes I, PMHNP 10/27/2013, 2:42 PM Agree with assessment and plan Madie RenoIrving A. Dub MikesLugo, M.D.

## 2013-10-27 NOTE — Progress Notes (Signed)
Patient ID: [name] Love, male   DOB: 1995/06/12, 18 y.o.   MRN: [id number] D. Patient presents with flat affect, he is very guarded with wide eyed stare and appears very preoccupied, responding to internal stimuli. He continues to be very hesitant to take medications this morning, attempting to cheek medications. He has been very isolative and not forthcoming with thoughts to staff. A. Medications given as ordered. Support and encouragement provided. Discussed above information with Renata Capriceonrad NP. R. Patient has been isolative and guarded but is in no acute distress at this time. No further voiced concerns. Will continue to monitor q 15 minutes for safety.

## 2013-10-27 NOTE — Progress Notes (Signed)
Patient ID: [name] Love, male   DOB: Jul 14, 1995, 18 y.o.   MRN: [id number] D: Client in bed most of the shift, up for snacks and medications, reports voices have decreased with medication, but client is preoccupied and guarded. A: Writer interacted with client encouraged conversation, reviewed medications. Client encouraged  to get up for snacks and medication. Staff will monitor q5815min for safety. R: Client is safe on the unit.

## 2013-10-27 NOTE — Progress Notes (Signed)
Patient in bed most of the shift. Isolative and mute. Patient did not respond to any of the question asked by this writer Except for the YES or NO answers which he does by nodding or shaking his head. Patient refused his medications. Every 15 minutes check on patient per protocol. Will continue to monitor patient.

## 2013-10-27 NOTE — Progress Notes (Signed)
Patient ID: [name] Love, male   DOB: 03-Dec-1995, 18 y.o.   MRN: [id number] Psychoeducational Group Note  Date:  10/27/2013 Time:0930am  Group Topic/Focus:  Identifying Needs:   The focus of this group is to help patients identify their personal needs that have been historically problematic and identify healthy behaviors to address their needs.  Participation Level:  Minimal  Participation Quality:  Redirectable  Affect:  Flat  Cognitive:  Disorganized  Insight:  Lacking  Engagement in Group:  Lacking  Additional Comments:  Healthy coping skills.   Valente DavidWeaver, Morena Mckissack Brooks 10/27/2013,11:02 AM

## 2013-10-27 NOTE — Progress Notes (Signed)
The focus of this group is to help patients review their daily goal of treatment and discuss progress on daily workbooks. Pt did not attend the evening group. 

## 2013-10-28 NOTE — [facility] Group Notes (Signed)
[facility] Group Notes: (Clinical Social Work)   10/28/2013      Type of Therapy:  Group Therapy   Participation Level:  Did Not Attend    Henny Strauch Grossman-Orr, LCSW 10/28/2013, 11:31 AM     

## 2013-10-28 NOTE — [facility] Group Notes (Signed)
[facility] Group Notes:  (Nursing/MHT/Case Management/Adjunct)  Date:  10/28/2013  Time:  11:33 AM  Type of Therapy:  Psychoeducational Skills  Participation Level:  Did Not Attend  Participation Quality:  did not attend  Affect:  did not attend  Cognitive:  did not attend  Insight:  None  Engagement in Group:  did not attend  Modes of Intervention:  did not attend  Summary of Progress/Problems:  [name], [name] Love 10/28/2013, 11:33 AM

## 2013-10-28 NOTE — Progress Notes (Signed)
Patient ID: [name] Love, male   DOB: 05-03-1995, 18 y.o.   MRN: [id number] [name] Love presents with flat affect he continues to be very suspicious and appears anxious. He remains guarded and with wide eyed look when Clinical research associatewriter approached. He denies any AH/VH but appears very preoccupied. Pt refused to complete self inventory. Marland Kitchen. He continues to be very hesitant to take medications this morning, attempting several times to cheek, but did comply with staff encouragement.  He remains isolative and withdrawn. A. Medications given as ordered. Support and encouragement provided.R. Patient has been isolative and guarded but is in no acute distress at this time. No further voiced concerns. Will continue to monitor q 15 minutes for safety.

## 2013-10-28 NOTE — Progress Notes (Signed)
Patient ID: [name] Love, male   DOB: 02-20-1996, 18 y.o.   MRN: [id number] [facility] MD Progress Note  10/28/2013 10:24 AM [name] Love  MRN:  [id number]  Subjective: [name] Love reports, "I don't feel like talking today"  Objective:  Patient is seen and chart is reviewed. [name] Love was assessed while lying down in his bed. He is verbally responsive in a soft voice. His affect is flat, and yet restricted. He says he does not want to talk today. He continue to isolate himself in his room and from the other patients. He is hesitant about taking his medications and at times pacing forth and back within the unit. [name] Love appears to be responding to some internal stimuli still, but denies any visual/auditory hallucinations.   Diagnosis:   DSM5: Schizophrenia Disorders:  Psychotic disorder Obsessive-Compulsive Disorders:  NA Trauma-Stressor Disorders:  NA Substance/Addictive Disorders:  NA Depressive Disorders:  NA Total Time spent with patient: 35 minutes  Axis I: Psychotic disorder Axis II: Deferred Axis III:  Past Medical History  Diagnosis Date  . Schizophrenia, acute    Axis IV: other psychosocial or environmental problems and mental illness, chronic Axis V: 21-30 behavior considerably influenced by delusions or hallucinations OR serious impairment in judgment, communication OR inability to function in almost all areas  ADL's:  Fair  Sleep: Good  Appetite:  Fair  Suicidal Ideation:  Plan:  Denies Intent:  Denies Means:  Denies Homicidal Ideation:  Plan:  Denies Intent:  Denies Means:  Denies AEB (as evidenced by):  Psychiatric Specialty Exam: Physical Exam  Review of Systems  Constitutional: Negative.   HENT: Negative.   Eyes: Negative.   Respiratory: Negative.   Cardiovascular: Negative.   Genitourinary: Negative.   Musculoskeletal: Negative.   Skin: Negative.   Neurological: Negative.   Endo/Heme/Allergies: Negative.   Psychiatric/Behavioral: Positive for hallucinations.  Negative for depression, suicidal ideas, memory loss and substance abuse. The patient has insomnia. The patient is not nervous/anxious.   All other systems reviewed and are negative.   Blood pressure 114/70, pulse 104, temperature 98.3 F (36.8 C), temperature source Oral, resp. rate 18, height 5\' 10"  (1.778 m), weight 71.668 kg (158 lb).Body mass index is 22.67 kg/(m^2).  General Appearance: Guarded  Eye Contact::  Fair  Speech:  Clear and Coherent and Normal Rate  Volume:  Decreased  Mood:  Depressed  Affect:  Restricted  Thought Process:  Disorganized  Orientation:  Full (Time, Place, and Person)  Thought Content:  Hallucinations: Auditory  Suicidal Thoughts:  No  Homicidal Thoughts:  No  Memory:  Immediate;   Fair Recent;   Fair Remote;   Poor  Judgement:  Impaired  Insight:  Lacking  Psychomotor Activity:  Decreased  Concentration:  Fair  Recall:  [employer] of Knowledge:Poor  Language: Fair  Akathisia:  No  Handed:  Right  AIMS (if indicated):     Assets:  Desire for Improvement  Sleep:  Number of Hours: 6.5   Musculoskeletal: Strength & Muscle Tone: within normal limits Gait & Station: normal Patient leans: N/A  Current Medications: Current Facility-Administered Medications  Medication Dose Route Frequency Provider Last Rate Last Dose  . acetaminophen (TYLENOL) tablet 650 mg  650 mg Oral Q4H PRN Benjaman Pott, MD      . acetaminophen (TYLENOL) tablet 650 mg  650 mg Oral Q6H PRN Benjaman Pott, MD      . alum & mag hydroxide-simeth (MAALOX/MYLANTA) 200-200-20 MG/5ML suspension 30 mL  30 mL Oral PRN Earvin Hansen  Irish Lack, MD      . alum & mag hydroxide-simeth (MAALOX/MYLANTA) 200-200-20 MG/5ML suspension 30 mL  30 mL Oral Q4H PRN Benjaman Pott, MD      . diphenhydrAMINE (BENADRYL) capsule 25 mg  25 mg Oral BID Jomarie Longs, MD   25 mg at 10/28/13 0800   Or  . diphenhydrAMINE (BENADRYL) injection 25 mg  25 mg Intramuscular BID Jomarie Longs, MD      .  diphenhydrAMINE (BENADRYL) capsule 25 mg  25 mg Oral Q6H PRN Jomarie Longs, MD       Or  . diphenhydrAMINE (BENADRYL) injection 25 mg  25 mg Intramuscular Q6H PRN Jomarie Longs, MD      . haloperidol (HALDOL) tablet 5 mg  5 mg Oral BID Jomarie Longs, MD   5 mg at 10/28/13 0800   Or  . haloperidol lactate (HALDOL) injection 5 mg  5 mg Intramuscular BID Jomarie Longs, MD      . haloperidol (HALDOL) tablet 5 mg  5 mg Oral Q6H PRN Jomarie Longs, MD   5 mg at 10/28/13 0801   Or  . haloperidol lactate (HALDOL) injection 5 mg  5 mg Intramuscular Q6H PRN Jomarie Longs, MD      . ibuprofen (ADVIL,MOTRIN) tablet 600 mg  600 mg Oral Q8H PRN Benjaman Pott, MD      . LORazepam (ATIVAN) tablet 1 mg  1 mg Oral Q6H PRN Jomarie Longs, MD       Or  . LORazepam (ATIVAN) injection 1 mg  1 mg Intramuscular Q6H PRN Saramma Eappen, MD      . magnesium hydroxide (MILK OF MAGNESIA) suspension 30 mL  30 mL Oral Daily PRN Benjaman Pott, MD      . ondansetron [facility]) tablet 4 mg  4 mg Oral Q8H PRN Benjaman Pott, MD      . traZODone (DESYREL) tablet 50 mg  50 mg Oral QHS Jomarie Longs, MD   50 mg at 10/27/13 2142    Lab Results: No results found for this or any previous visit (from the past 48 hour(s)).  Physical Findings: AIMS: Facial and Oral Movements Muscles of Facial Expression: None, normal Lips and Perioral Area: None, normal Jaw: None, normal Tongue: None, normal,Extremity Movements Upper (arms, wrists, hands, fingers): None, normal Lower (legs, knees, ankles, toes): None, normal, Trunk Movements Neck, shoulders, hips: None, normal, Overall Severity Severity of abnormal movements (highest score from questions above): None, normal Incapacitation due to abnormal movements: None, normal Patient's awareness of abnormal movements (rate only patient's report): No Awareness, Dental Status Current problems with teeth and/or dentures?: No Does patient usually wear dentures?: No  CIWA:    COWS:      Treatment Plan Summary: Daily contact with patient to assess and evaluate symptoms and progress in treatment Medication management  Plan: 1. Continue crisis management, mood stabilization & relapse prevention.. 2. Continue current medication management to reduce current symptoms to base line and improve the  patient's overall level of functioning; Continue Benedryl 25 mg for EPS, Haldol 5 mg for mood control, Lorazepam  1 mg for agitation & Trazodone 50 mg for insomnia. 3. Treat health problems if indicated. 4. Develop treatment plan to decrease risk of exacerbation of symptoms  after discharge and the need for  readmission. 5. Psycho-social education regarding self care.  Medical Decision Making Problem Points:  Established problem, stable/improving (1), Review of last therapy session (1) and Review of psycho-social stressors (1)  Data Points:  Review of medication regiment & side effects (2) Review of new medications or change in dosage (2)  I certify that inpatient services furnished can reasonably be expected to improve the patient's condition.   Sanjuana Kavawoko, Agnes I, PMHNP 10/28/2013, 10:24 AM I agree with assessment and plan Reymundo PollIrving A. Dub MikesLugo, M.D.

## 2013-10-28 NOTE — [facility] Group Notes (Signed)
0900 nursing orientation group  The focus of this group is to educate the patient on the purpose and policies of crisis stabilization and provide a format to answer questions about their admission.  The group details unit policies and expectations of patients while admitted.   Pt did not attend. 

## 2013-10-28 NOTE — Progress Notes (Signed)
D Pt. Denies SI and HI, denies A and VH when writer ask. No complaints of pain or discomfort noted.  A Writer offered support and encouragement.  R Pt. Remains safe on the unit.  Pt took his medications with no issues this pm and is presently resting quietly.

## 2013-10-29 DIAGNOSIS — F209 Schizophrenia, unspecified: Principal | ICD-10-CM

## 2013-10-29 NOTE — Progress Notes (Signed)
[facility] Group Notes:  (Nursing/MHT/Case Management/Adjunct)  Date:  10/29/2013  Time:  10:44 PM  Type of Therapy:  Group Therapy  Participation Level:  Minimal  Participation Quality:  Attentive  Affect:  Flat  Cognitive:  Alert  Insight:  Appropriate  Engagement in Group:  Engaged  Modes of Intervention:  Discussion  Summary of Progress/Problems: Patient says that his day went well and he slept a lot during the day. Says that he did not attend any groups today but would try to attend groups tomorrow. The thing that the patient does when he well to take care of himself is eat good foods, exercise and take warm showers.  [facility] Endoscopy Center IncJOHNSON,TAWANA 10/29/2013, 10:44 PM

## 2013-10-29 NOTE — Progress Notes (Signed)
Patient ID: [name] Love, male   DOB: Jun 28, 1995, 18 y.o.   MRN: [id number] D: Client visiting with mom earlier this evening, smiles sitting on the edge of chair with her. Client reports "no voices, medication seem like it's doing it's job" A: Clinical research associateWriter provided emotional support encouraged client to take his medications to avoid relapse and report any side effects. Staff encouraged group. Staff will monitor q4315min for safety. R: Client is safe on the unit, attended group.

## 2013-10-29 NOTE — [facility] Group Notes (Signed)
[facility]   10/29/2013 2:32 PM  Participation Quality:  Did not attend    [place], [name] Love
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
## 2013-10-29 NOTE — [facility] Group Notes (Signed)
[facility] LCSW Group Therapy  10/29/2013 1:15 pm  Type of Therapy: Process Group Therapy  Participation Level:  Active  Participation Quality:  Appropriate  Affect:  Flat  Cognitive:  Oriented  Insight:  Improving  Engagement in Group:  Limited  Engagement in Therapy:  Limited  Modes of Intervention:  Activity, Clarification, Education, Problem-solving and Support  Summary of Progress/Problems: Today's group addressed the issue of overcoming obstacles.  Patients were asked to identify their biggest obstacle post d/c that stands in the way of their on-going success, and then problem solve as to how to manage this.  Did not attend.  [name] Love, [name] Love 10/29/2013   2:33 PM

## 2013-10-29 NOTE — Progress Notes (Addendum)
D:  Patient's self inventory sheet, patient has been lying in bed most of morning.  After 1100 patient got out of bed and sitting in dayroom.  While talking to nurse, patient denied SI and HI.  Denied A/V hallucinations.  Denied pain.  Plans to return home after discharge and live with his mother.  Goal today is to exercise. A:  Medications administered per MD orders.  Emotional support and encouragement given patient. R: Safety maintained with 15 minute checks.

## 2013-10-30 MED ORDER — HALOPERIDOL DECANOATE 100 MG/ML IM SOLN
100.0000 mg | Freq: Once | INTRAMUSCULAR | Status: AC
  Administered 2013-10-30: 100 mg via INTRAMUSCULAR
  Filled 2013-10-30: qty 1

## 2013-10-30 MED ORDER — DIPHENHYDRAMINE HCL 50 MG/ML IJ SOLN
25.0000 mg | Freq: Once | INTRAMUSCULAR | Status: AC
  Administered 2013-10-30: 25 mg via INTRAMUSCULAR
  Filled 2013-10-30: qty 0.5

## 2013-10-30 NOTE — [facility] Group Notes (Signed)
Adult Psychoeducational Group Note  Date:  10/30/2013 Time:  8:40 PM  Group Topic/Focus:  Wrap-Up Group:   The focus of this group is to help patients review their daily goal of treatment and discuss progress on daily workbooks.  Participation Level:  Minimal  Participation Quality:  Attentive  Affect:  Flat  Cognitive:  Alert  Insight: Limited  Engagement in Group:  Limited  Modes of Intervention:  Discussion  Additional Comments:  [name] Love stated his day was awesome.  He said he had a good breakfast, lunch and dinner.  He stated his goal was to stay healthy and he is still working on discharge plans.  [name], [name] A 10/30/2013, 8:40 PM

## 2013-10-30 NOTE — Progress Notes (Signed)
Haldol Dec.100mg /751ml IM and Benadryl 25 mg / 0.5 ml IM was given as ordered and tolerated well by pt. Med Education and the importance of compliance done with pt. prior to administration and he verbalized understanding. Pt. encouraged to voice changes in current status, related to possible side effects.

## 2013-10-30 NOTE — [facility] Group Notes (Signed)
[facility] LCSW Group Therapy  10/30/2013 , 3:27 PM   Type of Therapy:  Group Therapy  Participation Level:  Active  Participation Quality:  Attentive  Affect:  Appropriate  Cognitive:  Alert  Insight:  Improving  Engagement in Therapy:  Engaged  Modes of Intervention:  Discussion, Exploration and Socialization  Summary of Progress/Problems: Today's group focused on the term Diagnosis.  Participants were asked to define the term, and then pronounce whether it is a negative, positive or neutral term.  Attended and participated for the first time.  Stated that he is diagnosed with schizophrenia, and what that means to him is that he has to take meds and work out by [employer] weights.  I asked him about not taking meds prior to admission.  He brushed this off by saying it is important for anyone with anyone with a diagnosis to take meds.  Was attentive and engaged throughout.  Daryel Geraldorth, Jayleon Mcfarlane B 10/30/2013 , 3:27 PM

## 2013-10-30 NOTE — Tx Team (Signed)
  Interdisciplinary Treatment Plan Update   Date Reviewed:  10/30/2013  Time Reviewed:  4:17 PM  Progress in Treatment:   Attending groups: Yes Participating in groups: Yes Taking medication as prescribed: Yes  Tolerating medication: Yes Family/Significant other contact made: Yes  Patient understands diagnosis: Yes  Discussing patient identified problems/goals with staff: Yes Medical problems stabilized or resolved: Yes Denies suicidal/homicidal ideation: Yes  In tx team Patient has not harmed self or others: Yes  For review of initial/current patient goals, please see plan of care.  Estimated Length of Stay:  3-4 days  Reason for Continuation of Hospitalization: Medication stabilization  New Problems/Goals identified:  N/A  Discharge Plan or Barriers:   return home, follow up outpt  Additional Comments:  " I am fine."I do not hear any voices today"  Objective:   Patient reports his voices are coming down. He denies any AH/VH today. Reports that his appetite and sleep are good. Reports that the [place] part of the day is "about food" and that he enjoyed his pizza yesterday.Patient appears to be more pleasant ,less guarded, more able to communicate freely with spontaneous speech. No inappropriate affect noted today.  Haldol Dec initiated today.   Attendees:  Signature: Thedore MinsMojeed Akintayo, MD 10/30/2013 4:17 PM   Signature: Richelle Itood Alexzavier Girardin, LCSW 10/30/2013 4:17 PM  Signature: Fransisca KaufmannLaura Davis, NP 10/30/2013 4:17 PM  Signature: Joslyn Devonaroline Beaudry, RN 10/30/2013 4:17 PM  Signature: Liborio NixonPatrice White, RN 10/30/2013 4:17 PM  Signature:  10/30/2013 4:17 PM  Signature:   10/30/2013 4:17 PM  Signature:    Signature:    Signature:    Signature:    Signature:    Signature:      Scribe for Treatment Team:   Richelle Itood Anjulie Dipierro, LCSW  10/30/2013 4:17 PM

## 2013-10-30 NOTE — Progress Notes (Signed)
Patient ID: [name] Love, male   DOB: 20-Sep-1995, 20 y.o.   MRN: [id number] [facility]  10/30/2013 2:39 PM [name] Love  MRN:  [id number]  Subjective: Patient reports: " I am fine ,i do not hear any voices today"   Objective:  Patient was seen and his chart was reviewed. Patient reports his voices are coming down. He denies any AH/VH today. Reports that his appetite and sleep are good. Reports that the [place] part of the day is "about food" and that he enjoyed his pizza yesterday.Patient appears to be more pleasant ,less guarded, more able to communicate freely with spontaneous speech. No inappropriate affect noted today. Denies any side effects.    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  Eyes: Pupils are equal, round, and reactive to light.  Neurological: He is alert.  Skin: Skin is warm and dry.    Review of Systems  Constitutional: Negative.   HENT: Negative.   Eyes: Negative.   Respiratory: Negative.   Cardiovascular: Negative.   Genitourinary: Negative.   Musculoskeletal: Negative.   Skin: Negative.   Neurological: Negative.   Endo/Heme/Allergies: Negative.   Psychiatric/Behavioral: Negative for depression, suicidal ideas, memory loss and substance abuse. The patient is nervous/anxious.   All other systems reviewed and are negative.   Blood pressure 103/67, pulse 75, temperature 98.2 F (36.8 C), temperature source Oral, resp. rate 18, height 5\' 10"  (1.778 m), weight 71.668 kg (158 lb).Body mass index is 22.67  kg/(m^2).  General Appearance: Guarded  Eye Contact::  Fair  Speech:  Clear and Coherent and Normal Rate  Volume:  Decreased  Mood:  Depressed  Affect:  Restricted  Thought Process:  Disorganized  Orientation:  Full (Time, Place, and Person)  Thought Content:  Hallucinations: Auditory  Suicidal Thoughts:  No  Homicidal Thoughts:  No  Memory:  Immediate;   Fair Recent;   Fair Remote;   Poor  Judgement:  Impaired  Insight:  Lacking  Psychomotor Activity:  Decreased  Concentration:  Fair  Recall:  [employer]Fair  Fund of Knowledge:Poor  Language: Fair  Akathisia:  No  Handed:  Right  AIMS (if indicated):     Assets:  Desire for Improvement  Sleep:  Number of Hours: 6.75   Musculoskeletal: Strength & Muscle Tone: within normal limits Gait & Station: normal Patient leans: N/A  Current Medications: Current Facility-Administered Medications  Medication Dose Route Frequency Provider Last Rate Last Dose  . acetaminophen (TYLENOL) tablet 650 mg  650 mg Oral Q4H PRN Benjaman PottGerald D Taylor, MD      . acetaminophen (TYLENOL) tablet 650 mg  650 mg Oral Q6H PRN Benjaman PottGerald D Taylor, MD      . alum & mag hydroxide-simeth (MAALOX/MYLANTA) 200-200-20 MG/5ML suspension 30 mL  30 mL Oral PRN Benjaman PottGerald D Taylor, MD      . alum & mag hydroxide-simeth (MAALOX/MYLANTA) 200-200-20 MG/5ML suspension 30 mL  30 mL Oral Q4H PRN Benjaman PottGerald D Taylor, MD      . diphenhydrAMINE (BENADRYL) capsule 25 mg  25 mg Oral BID Jomarie LongsSaramma Adley Mazurowski, MD  25 mg at 10/30/13 0813   Or  . diphenhydrAMINE (BENADRYL) injection 25 mg  25 mg Intramuscular BID Jomarie Longs, MD      . diphenhydrAMINE (BENADRYL) capsule 25 mg  25 mg Oral Q6H PRN Jomarie Longs, MD       Or  . diphenhydrAMINE (BENADRYL) injection 25 mg  25 mg Intramuscular Q6H PRN Jomarie Longs, MD      . haloperidol (HALDOL) tablet 5 mg  5 mg Oral BID Jomarie Longs, MD   5 mg at 10/30/13 0813   Or  . haloperidol lactate (HALDOL) injection 5 mg  5 mg Intramuscular BID Jomarie Longs, MD       . haloperidol (HALDOL) tablet 5 mg  5 mg Oral Q6H PRN Jomarie Longs, MD   5 mg at 10/28/13 1622   Or  . haloperidol lactate (HALDOL) injection 5 mg  5 mg Intramuscular Q6H PRN Jomarie Longs, MD      . ibuprofen (ADVIL,MOTRIN) tablet 600 mg  600 mg Oral Q8H PRN Benjaman Pott, MD      . LORazepam (ATIVAN) tablet 1 mg  1 mg Oral Q6H PRN Jomarie Longs, MD   1 mg at 10/28/13 2132   Or  . LORazepam (ATIVAN) injection 1 mg  1 mg Intramuscular Q6H PRN Edgardo Petrenko, MD      . magnesium hydroxide (MILK OF MAGNESIA) suspension 30 mL  30 mL Oral Daily PRN Benjaman Pott, MD      . ondansetron [facility]) tablet 4 mg  4 mg Oral Q8H PRN Benjaman Pott, MD      . traZODone (DESYREL) tablet 50 mg  50 mg Oral QHS Jomarie Longs, MD   50 mg at 10/29/13 2142    Lab Results: No results found for this or any previous visit (from the past 48 hour(s)).  Physical Findings: AIMS: Facial and Oral Movements Muscles of Facial Expression: None, normal Lips and Perioral Area: None, normal Jaw: None, normal Tongue: None, normal,Extremity Movements Upper (arms, wrists, hands, fingers): None, normal Lower (legs, knees, ankles, toes): None, normal, Trunk Movements Neck, shoulders, hips: None, normal, Overall Severity Severity of abnormal movements (highest score from questions above): None, normal Incapacitation due to abnormal movements: None, normal Patient's awareness of abnormal movements (rate only patient's report): No Awareness, Dental Status Current problems with teeth and/or dentures?: No Does patient usually wear dentures?: No  CIWA:  CIWA-Ar Total: 0 COWS:  COWS Total Score: 1  Treatment Plan Summary: Daily contact with patient to assess and evaluate symptoms and progress in treatment Medication management  Plan:   Will continue  haldol 5 mg po bid for psychosis. Patient on FORCED MEDICATION ORDER (second opinion in chart ).  -Will give haldol decanoate 100 mg IM today with benadryl IM  (10/30/13). Patient can get it q4weekly with outpatient provider. Will continue trazodone 50 mg for sleep. -Will make available prn's for agitation.  --Patient to participate on therapeutic milieu.  -CSW will work on disposition       Problem Points:  Established problem, improving (1), Review of last therapy session (1) and Review of psycho-social stressors (1)  Data Points:  Review of medication regiment & side effects (2) Review of new medications or change in dosage (2)  I certify that inpatient services furnished can reasonably be expected to improve the patient's condition.   Viviane Semidey, MD 10/30/2013, 2:39 PM

## 2013-10-30 NOTE — Progress Notes (Signed)
The focus of this group is to educate the patient on the purpose and policies of crisis stabilization and provide a format to answer questions about their admission.  The group details unit policies and expectations of patients while admitted. Pt. attended group as scheduled and was engaged with peers and staff throughout the session. Pt. stated his goal is to be focused and driven.

## 2013-10-30 NOTE — Progress Notes (Signed)
D: Pt. alert and calm so far this shift. Denied pain, auditory or visual hallucinations when assessed this AM. Compliant with medications via PO route. New order received for Haldol Dec. 161m/1ml IM and Benadryl 25 mg IM . A: Will continue to monitor pt. on Q 15 minutes observation for safety as well as MD's orders for changes. R: Pt. attended ward group, participated throughout the session, getting his needs met safely.

## 2013-10-31 NOTE — [facility] Group Notes (Signed)
[facility]   10/31/2013 10:12 AM  Participation Quality:  Engaged  Mood/Affect:  Blunted  Depression Rating:  denies  Anxiety Rating:  denies  Thoughts of Suicide:  No Will you contract for safety?   NA  Current AVH:  Denies  Plan for Discharge/Comments:  "I am looking forward to going home tomorrow.  The Dr told me to not drink alcohol or use drugs, so I will not.  I will also take my medication.  I will look for a job."  [employer]orporatedransportation Means: mother  Supports: mother  [place], Baldo DaubRodney B
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
## 2013-10-31 NOTE — Progress Notes (Addendum)
Patient ID: [name] Love, male   DOB: 05-13-1995, 18 y.o.   MR[facility]: [id number] [facility]  10/31/2013 11:43 AM Deantre Cargile  MRN:  [id number]  Subjective: Patient reports: " I am all right".  Objective:  Patient was seen and his chart was reviewed. Patient reports that he does not have any voices today. Patient denies any VH/SI/HI. Reports medications as helping him. Denies any side effects. Reports that his appetite and sleep are good. Per staff he is less withdrawn ,no outburst reported.   Diagnosis:   DSM5:  Primary psychiatric diagnosis  Schizophrenia Disorders: schizophrenia First episode,currently in acute episode  Non -Psychiatric diagnosis;  None     ADL's:  Fair  Sleep: Good  Appetite:  Good  Suicidal Ideation:  Plan:  Denies Intent:  Denies Means:  Denies Homicidal Ideation:  Plan:  Denies Intent:  Denies Means:  Denies AEB (as evidenced by):  Psychiatric Specialty Exam: Physical Exam  Constitutional: He appears well-developed and well-nourished.  Eyes: Pupils are equal, round, and reactive to light.  Neurological: He is alert.  Skin: Skin is warm and dry.    Review of Systems  Constitutional: Negative.   HENT: Negative.   Eyes: Negative.   Respiratory: Negative.   Cardiovascular: Negative.   Genitourinary: Negative.   Musculoskeletal: Negative.   Skin: Negative.   Neurological: Negative.   Endo/Heme/Allergies: Negative.   Psychiatric/Behavioral: Negative for depression, suicidal ideas, memory loss and substance abuse. The patient is nervous/anxious.   All other systems reviewed and are negative.   Blood pressure 120/62, pulse 102, temperature 98.2 F (36.8 C), temperature source Oral, resp. rate 16, height 5\' 10"  (1.778 m), weight 71.668 kg (158 lb).Body mass index is 22.67 kg/(m^2).  General Appearance: Guarded  Eye Contact::  Fair  Speech:  Clear and Coherent and Normal Rate  Volume:  Decreased  Mood:  Anxious  Affect:  Congruent   Thought Process:  Disorganized and MORE LOGICAL  Orientation:  Full (Time, Place, and Person)  Thought Content:  denies AH/VH  Suicidal Thoughts:  No  Homicidal Thoughts:  No  Memory:  Immediate;   Fair Recent;   Fair Remote;   Poor  Judgement:  Impaired  Insight:  Lacking  Psychomotor Activity:  Decreased  Concentration:  Fair  Recall:  [employer]Fair  Fund of Knowledge:Poor  Language: Fair  Akathisia:  No  Handed:  Right  AIMS (if indicated):     Assets:  Desire for Improvement  Sleep:  Number of Hours: 6.75   Musculoskeletal: Strength & Muscle Tone: within normal limits Gait & Station: normal Patient leans: N/A  Current Medications: Current Facility-Administered Medications  Medication Dose Route Frequency Provider Last Rate Last Dose  . acetaminophen (TYLENOL) tablet 650 mg  650 mg Oral Q4H PRN Benjaman PottGerald D Taylor, MD      . acetaminophen (TYLENOL) tablet 650 mg  650 mg Oral Q6H PRN Benjaman PottGerald D Taylor, MD      . alum & mag hydroxide-simeth (MAALOX/MYLANTA) 200-200-20 MG/5ML suspension 30 mL  30 mL Oral PRN Benjaman PottGerald D Taylor, MD      . alum & mag hydroxide-simeth (MAALOX/MYLANTA) 200-200-20 MG/5ML suspension 30 mL  30 mL Oral Q4H PRN Benjaman PottGerald D Taylor, MD      . diphenhydrAMINE (BENADRYL) capsule 25 mg  25 mg Oral BID Jomarie LongsSaramma Nate Common, MD   25 mg at 10/31/13 0751   Or  . diphenhydrAMINE (BENADRYL) injection 25 mg  25 mg Intramuscular BID Jomarie LongsSaramma Venora Kautzman, MD      .  diphenhydrAMINE (BENADRYL) capsule 25 mg  25 mg Oral Q6H PRN Jomarie Longs, MD       Or  . diphenhydrAMINE (BENADRYL) injection 25 mg  25 mg Intramuscular Q6H PRN Jomarie Longs, MD      . haloperidol (HALDOL) tablet 5 mg  5 mg Oral BID Jomarie Longs, MD   5 mg at 10/31/13 0751   Or  . haloperidol lactate (HALDOL) injection 5 mg  5 mg Intramuscular BID Jomarie Longs, MD      . haloperidol (HALDOL) tablet 5 mg  5 mg Oral Q6H PRN Jomarie Longs, MD   5 mg at 10/28/13 1622   Or  . haloperidol lactate (HALDOL) injection 5 mg  5 mg  Intramuscular Q6H PRN Jomarie Longs, MD      . ibuprofen (ADVIL,MOTRIN) tablet 600 mg  600 mg Oral Q8H PRN Benjaman Pott, MD      . LORazepam (ATIVAN) tablet 1 mg  1 mg Oral Q6H PRN Jomarie Longs, MD   1 mg at 10/28/13 2132   Or  . LORazepam (ATIVAN) injection 1 mg  1 mg Intramuscular Q6H PRN Anallely Rosell, MD      . magnesium hydroxide (MILK OF MAGNESIA) suspension 30 mL  30 mL Oral Daily PRN Benjaman Pott, MD      . ondansetron [facility]) tablet 4 mg  4 mg Oral Q8H PRN Benjaman Pott, MD      . traZODone (DESYREL) tablet 50 mg  50 mg Oral QHS Jomarie Longs, MD   50 mg at 10/29/13 2142    Lab Results: No results found for this or any previous visit (from the past 48 hour(s)).  Physical Findings: AIMS: Facial and Oral Movements Muscles of Facial Expression: None, normal Lips and Perioral Area: None, normal Jaw: None, normal Tongue: None, normal,Extremity Movements Upper (arms, wrists, hands, fingers): None, normal Lower (legs, knees, ankles, toes): None, normal, Trunk Movements Neck, shoulders, hips: None, normal, Overall Severity Severity of abnormal movements (highest score from questions above): None, normal Incapacitation due to abnormal movements: None, normal Patient's awareness of abnormal movements (rate only patient's report): No Awareness, Dental Status Current problems with teeth and/or dentures?: No Does patient usually wear dentures?: No  CIWA:  CIWA-Ar Total: 0 COWS:  COWS Total Score: 1  Treatment Plan Summary: Daily contact with patient to assess and evaluate symptoms and progress in treatment Medication management  Plan:   Will continue  haldol 5 mg po bid for psychosis. Patient on FORCED MEDICATION ORDER (second opinion in chart ).  -Haldol decanoate 100 mg IM today with benadryl IM (10/30/13) given . Patient can get it q4weekly with outpatient provider. Will continue trazodone 50 mg for sleep. -Will make available prn's for agitation.  --Patient to  participate on therapeutic milieu.  -CSW will work on disposition       Problem Points:  Established problem, improving (1), Review of last therapy session (1) and Review of psycho-social stressors (1)  Data Points:  Review of medication regiment & side effects (2) Review of new medications or change in dosage (2)  I certify that inpatient services furnished can reasonably be expected to improve the patient's condition.   Arianny Pun, MD 10/31/2013, 11:43 AM

## 2013-10-31 NOTE — [facility] Group Notes (Signed)
Adult Psychoeducational Group Note  Date:  10/31/2013 Time:  9:35 PM  Group Topic/Focus:  Wrap-Up Group:   The focus of this group is to help patients review their daily goal of treatment and discuss progress on daily workbooks.  Participation Level:  Minimal  Participation Quality:  Attentive  Affect:  Flat  Cognitive:  Alert  Insight: Limited  Engagement in Group:  Limited  Modes of Intervention:  Discussion  Additional Comments:  [name] Love stated he had Love good day and watched cartoons.  He expressed that he writes things down and reads as Love coping skill.  [name] RancherLindsay, [name] Love 10/31/2013, 9:35 PM

## 2013-10-31 NOTE — Progress Notes (Signed)
Patient ID: [name] Love, male   DOB: 09-25-1995, 18 y.o.   MRN: [id number]   D: Pt was laying in bed during the adm. Writer observed pt looking back as if he wasn't sure of his roommate as well as the Clinical research associatewriter. Pt informed the writer that he didn't have any questions or concerns. Writer informed the pt that he had a sleep med available to help him get some rest. Pt stated, "rest, yes." Pt followed the writer to the med window, however once at the window pt stated, "I don't think I need medicine to help me rest". Pt denied A/V.  A:  Support and encouragement was offered. 15 min checks continued for safety.  R: Pt remains safe.

## 2013-10-31 NOTE — Progress Notes (Signed)
D: Patient denies SI/HI and A/V hallucinations; patient reports that he was able to get some sleep after he was awake for awhile  A: Monitored q 15 minutes; patient encouraged to attend groups; patient educated about medications; patient given medications per physician orders; patient encouraged to express feelings and/or concerns  R: Patient is very quiet, flat and blunted; patient is guarded and minimal; patient does not engage with staff and peers without being prompted; patient is taking medications as prescribed and tolerating medications; patient is attending all groups

## 2013-10-31 NOTE — [facility] Group Notes (Signed)
[facility]  10/31/2013 , 12:58 PM    Type of Therapy:  Mental [employer] Presentation  Participation Level:  Active  Participation Quality:  Attentive  Affect:  Blunted  Cognitive:  Oriented  Insight:  Limited  Engagement in Therapy:  Engaged  Modes of Intervention:  Discussion, Education and Socialization  Summary of Progress/Problems:  Onalee HuaDavid from [employer] came to present his recovery story and play the guitar.  Sat quietly throughout   Appeared to be sleeping during the guitar playing.  Daryel Geraldorth, Demontrae Gilbert B 10/31/2013 , 12:58 PM

## 2013-11-01 DIAGNOSIS — F200 Paranoid schizophrenia: Secondary | ICD-10-CM

## 2013-11-01 MED ORDER — DIPHENHYDRAMINE HCL 25 MG PO CAPS: 25.0000 mg | ORAL_CAPSULE | Freq: Two times a day (BID) | ORAL | Status: DC

## 2013-11-01 MED ORDER — HALOPERIDOL 5 MG PO TABS: 5.0000 mg | ORAL_TABLET | Freq: Two times a day (BID) | ORAL | Status: DC

## 2013-11-01 NOTE — [facility] Group Notes (Signed)
[facility] Group Notes:  (Nursing/MHT/Case Management/Adjunct)  Date:  11/01/2013  Time:  11:32 AM  Type of Therapy:  Nurse Education  Participation Level:  Active  Participation Quality:  Appropriate and Attentive  Affect:  Appropriate  Cognitive:  Alert  Insight:  Good  Engagement in Group:  Engaged  Modes of Intervention:  Discussion  Summary of Progress/Problems: Patient reports that his goal after discharge is to control his anger.  Marzetta BoardDopson, Mariyah Upshaw E 11/01/2013, 11:32 AM

## 2013-11-01 NOTE — Progress Notes (Signed)
[facility] :  Will you be returning to the same living situation after discharge: Yes,  home At discharge, do you have transportation home?:Yes,  family Do you have the ability to pay for your medications:Yes,  mental health/MCD  Release of information consent forms completed and in the chart;  Patient's signature needed at discharge.  Patient to Follow up at: Follow-up Information   Follow up with [facility].   Contact information:   [address]  [place]  [phone]      Patient denies SI/HI:   Yes,  yes    Safety Planning and Suicide Prevention discussed:  Yes,  yes  Ida Rogueorth, Julien Oscar B 11/01/2013, 8:14 AM

## 2013-11-01 NOTE — [facility] Suicide Risk Assessment (Signed)
   Demographic Factors:  Male and Unemployed  Total Time spent with patient: 20 minutes  Psychiatric Specialty Exam: Physical Exam  Constitutional: He appears well-developed and well-nourished.  HENT:  Head: Normocephalic and atraumatic.  Neck: Normal range of motion. Neck supple.  Skin: Skin is warm.  Psychiatric: He has a normal mood and affect. His behavior is normal.    Review of Systems  Constitutional: Negative.   HENT: Negative.   Eyes: Negative.   Cardiovascular: Negative.   Gastrointestinal: Negative.   Skin: Negative.   Neurological: Negative.     Blood pressure 120/62, pulse 102, temperature 98.2 F (36.8 C), temperature source Oral, resp. rate 16, height 5\' 10"  (1.778 m), weight 71.668 kg (158 lb).Body mass index is 22.67 kg/(m^2).  General Appearance: Casual  Eye Contact::  Fair  Speech:  Normal Rate  Volume:  Normal  Mood:  Anxious  Affect:  Congruent  Thought Process:  Linear  Orientation:  Full (Time, Place, and Person)  Thought Content:  Denies AH/VH ,not seen to be responding to internal stimuli any more  Suicidal Thoughts:  No  Homicidal Thoughts:  No  Memory:  Immediate;   Fair Recent;   Fair Remote;   Fair  Judgement:  Fair  Insight:  Fair  Psychomotor Activity:  Normal  Concentration:  Fair  Recall:  FiservFair  Fund of Knowledge:Fair  Language: Fair  Akathisia:  No    AIMS (if indicated):   0  Assets:  Desire for Improvement  Sleep:  Number of Hours: 6.5    Musculoskeletal: Strength & Muscle Tone: within normal limits Gait & Station: normal Patient leans: N/A   Mental Status Per Nursing Assessment::   On Admission:     Current Mental Status by Physician: denies SI/HI/AH/VH  Loss Factors: Financial problems/change in socioeconomic status  Historical Factors: None  Risk Reduction Factors:   Positive social support  Continued Clinical Symptoms:  Schizophrenia:   Less than 20 years old  Cognitive Features That Contribute To  Risk:  Closed-mindedness    Suicide Risk:  Minimal: No identifiable suicidal ideation.  Patients presenting with no risk factors but with morbid ruminations; may be classified as minimal risk based on the severity of the depressive symptoms  Discharge Diagnoses:   Diagnosis:  DSM5:  Primary psychiatric diagnosis  Schizophrenia Disorders: schizophrenia First episode,currently in acute episode   Non -Psychiatric diagnosis;  None     Plan Of Care/Follow-up recommendations:  Activity:  No restrictions,follow up with out patient provider as scheduled  Is patient on multiple antipsychotic therapies at discharge:  No   Has Patient had three or more failed trials of antipsychotic monotherapy by history:  No  Recommended Plan for Multiple Antipsychotic Therapies: NA    [name] Love 11/01/2013, 10:03 AM

## 2013-11-01 NOTE — Progress Notes (Signed)
Patient ID: [name] Love, male   DOB: [birth year], 18 y.o.   MRN: [id number]  D: When asked about his day pt stated, "I'm fine". Pt wouldn't readily engage the Clinical research associatewriter in conversation. Writer asked what did you and the Dr's talk about. Pt stated, "home". When asked how he felt about going home pt stated, "feel good, it's time to go".   A:  Support and encouragement was offered. 15 min checks continued for safety.  R: Pt remains safe.

## 2013-11-01 NOTE — Progress Notes (Signed)
Patient ID: [name] Love, male   DOB: Nov 29, 1995, 18 y.o.   MRN: [id number] He has been discharged home and was picked up by his mother.  All his belongings were taken home with him. Stated that he was going home and shower and then chill out.  He was calm  and cooperative. He voiced that his nurse had gone over his discharge instruction.

## 2013-11-01 NOTE — Tx Team (Signed)
  Interdisciplinary Treatment Plan Update   Date Reviewed:  11/01/2013  Time Reviewed:  8:12 AM  Progress in Treatment:   Attending groups: Yes Participating in groups: Yes Taking medication as prescribed: Yes  Tolerating medication: Yes Family/Significant other contact made: Yes  Patient understands diagnosis: Yes  Discussing patient identified problems/goals with staff: Yes Medical problems stabilized or resolved: Yes Denies suicidal/homicidal ideation: Yes Patient has not harmed self or others: Yes  For review of initial/current patient goals, please see plan of care.  Estimated Length of Stay:  D/C today  Reason for Continuation of Hospitalization:   New Problems/Goals identified:  N/A  Discharge Plan or Barriers:   return home, [facility]  Additional Comments:  Attendees:  Signature: Thedore MinsMojeed Akintayo, MD 11/01/2013 8:12 AM   Signature: Richelle Itood Margaret Staggs, LCSW 11/01/2013 8:12 AM  Signature: Fransisca KaufmannLaura Davis, NP 11/01/2013 8:12 AM  Signature: Joslyn Devonaroline Beaudry, RN 11/01/2013 8:12 AM  Signature: Liborio NixonPatrice White, RN 11/01/2013 8:12 AM  Signature:  11/01/2013 8:12 AM  Signature:   11/01/2013 8:12 AM  Signature:    Signature:    Signature:    Signature:    Signature:    Signature:      Scribe for Treatment Team:   Richelle Itood Landen Breeland, LCSW  11/01/2013 8:12 AM

## 2013-11-05 NOTE — Progress Notes (Signed)
Patient Discharge Instructions:  After Visit Summary (AVS):   Faxed to:  11/05/13 Psychiatric Admission Assessment Note:   Faxed to:  11/05/13 Suicide Risk Assessment - Discharge Assessment:   Faxed to:  11/05/13 Faxed/Sent to the Next Level Care provider:  11/05/13 Faxed to [facility] @ [phone]  Jerelene Redden, 11/05/2013, 3:41 PM

## 2013-11-26 NOTE — Discharge Summary (Signed)
Physician Discharge Summary Note  [name] is an 18 y.o., male MRN:  [id number] DOB:  February 13, 1996 Patient phone:  [phone] (home)  Patient address:   [address] [place] [address],  Total Time spent with patient: 30 minutes  Date of Admission:  10/25/2013 Date of Discharge: 11/01/2013  Reason for Admission:  [facility]  Discharge Diagnoses: Active Problems:   Psychotic disorder   Psychiatric Specialty Exam: Physical Exam  Review of Systems  Constitutional: Negative.   HENT: Negative.   Eyes: Negative.   Respiratory: Negative.   Gastrointestinal: Negative.   Genitourinary: Negative.   Musculoskeletal: Negative.   Skin: Negative.   Neurological: Negative.   Endo/Heme/Allergies: Negative.   Psychiatric/Behavioral: Positive for depression. The patient is nervous/anxious.     Blood pressure 123/70, pulse 101, temperature 98.4 F (36.9 C), temperature source Oral, resp. rate 20, height  (1.778 m), weight 71.668 kg (158 lb).Body mass index is 22.67 kg/(m^2).   General Appearance: Casual   Eye Contact:: Fair   Speech: Normal Rate   Volume: Normal   Mood: Anxious   Affect: Congruent   Thought Process: Linear   Orientation: Full (Time, Place, and Person)   Thought Content: Denies AH/VH ,not seen to be responding to internal stimuli any more   Suicidal Thoughts: No   Homicidal Thoughts: No   Memory: Immediate; Fair  Recent; Fair  Remote; Fair   Judgement: Fair   Insight: Fair   Psychomotor Activity: Normal   Concentration: Fair   Recall: [employer] of Knowledge:Fair   Language: Fair   Akathisia: No     AIMS (if indicated): 0   Assets: Desire for Improvement   Sleep: Number of Hours: 6.5    Musculoskeletal:  Strength & Muscle Tone: within normal limits  Gait & Station: normal  Patient leans: N/A    DSM5:  Schizophrenia Disorders:  Schizophrenia (295.7) (first episode)  Axis Diagnosis:   AXIS I:  Schizophrenia AXIS II:   Deferred AXIS III:   Past Medical History  Diagnosis Date  . Schizophrenia, acute    AXIS IV:  No known mitigating factors AXIS V:  31-40 impairment in reality testing  Level of Care:  OP  Hospital Course:  Patient is a 22 y old AAM, who is single, high school graduate ,reportedly lives with mother as well as siblings,unemployed ,was brought in IVC ed for bizarre behavior as well as talking in 3rd person. Patient per IVC documentation has a history of schizophrenia , was on latuda 20 mg ,but became disorganized and hence had to be brought in. Patient denies any AH/VH ,reports he is here for behavior problems. He is unable to give details about who brought him here and says it is Psychologist, prison and probation services . Patient throughout the interview appeared to be inappropriate ,found staring at times,had bizarre gestures of his hands and was unable to answer questions being asked, laughing at times for no reason, avoiding eye contact. Patient reports being a high school graduate and wanting to go to college to be a Hydrographic surveyor. Reports he has never been hospitalizaed before but is unreliable with detailed past history. Per collateral obtained from mother (Per report from CSW) - Patient was in his usual state of health ,when three weeks ago he was found to be disorganized ,not making any sense and he was taken to the mental health center where he was started on latuda as well as given a diagnosis of schizophrenia. Per mother he used  to be a good student, does not abuse any substances, has never had any mental health issues before.  During Hospitalization: Medications managed, psychoeducation, group and individual therapy. Pt currently denies SI, HI, and Psychosis. At discharge, pt rates anxiety and depression as minimal. Pt states that he does have a good supportive home environment and will followup with outpatient treatment at [facility]. Affirms agreement with medication regimen and discharge plan. Denies other physical and  psychological concerns at time of discharge.    Consults:  None  Significant Diagnostic Studies:  None  Discharge Vitals:   Blood pressure 123/70, pulse 101, temperature 98.4 F (36.9 C), temperature source Oral, resp. rate 20, height  (1.778 m), weight 71.668 kg (158 lb). Body mass index is 22.67 kg/(m^2). Lab Results:   No results found for this or any previous visit (from the past 72 hour(s)).  Physical Findings: AIMS: Facial and Oral Movements Muscles of Facial Expression: None, normal Lips and Perioral Area: None, normal Jaw: None, normal Tongue: None, normal,Extremity Movements Upper (arms, wrists, hands, fingers): None, normal Lower (legs, knees, ankles, toes): None, normal, Trunk Movements Neck, shoulders, hips: None, normal, Overall Severity Severity of abnormal movements (highest score from questions above): None, normal Incapacitation due to abnormal movements: None, normal Patient's awareness of abnormal movements (rate only patient's report): No Awareness, Dental Status Current problems with teeth and/or dentures?: No Does patient usually wear dentures?: No  CIWA:  CIWA-Ar Total: 0 COWS:  COWS Total Score: 1  Psychiatric Specialty Exam: See Psychiatric Specialty Exam and Suicide Risk Assessment completed by Attending Physician prior to discharge.  Discharge destination:  Home  Is patient on multiple antipsychotic therapies at discharge:  No   Has Patient had three or more failed trials of antipsychotic monotherapy by history:  No  Recommended Plan for Multiple Antipsychotic Therapies: NA     Medication List    STOP taking these medications       lurasidone 40 MG Tabs tablet  Commonly known as:  LATUDA      TAKE these medications     Indication   diphenhydrAMINE 25 mg capsule  Commonly known as:  BENADRYL  Take 1 capsule (25 mg total) by mouth 2 (two) times daily.   Indication:  mood stabilization     haloperidol 5 MG tablet  Commonly  known as:  HALDOL  Take 1 tablet (5 mg total) by mouth 2 (two) times daily.   Indication:  mood stabilization           Follow-up Information   Follow up with [facility].   Contact information:   [address]  [place]  [phone]      Follow-up recommendations:  Activity:  As tolerated Diet:  Heart healthy with low sodium.  Comments:   Take all medications as prescribed. Keep all follow-up appointments as scheduled.  Do not consume alcohol or use illegal drugs while on prescription medications. Report any adverse effects from your medications to your primary care provider promptly.  In the event of recurrent symptoms or worsening symptoms, call 911, a crisis hotline, or go to the nearest emergency department for evaluation.   Total Discharge Time:  Greater than 30 minutes.  Signed: Beau Fanny, FNP-BC 11/01/2013, 4:32 PM

## 2013-11-27 NOTE — Discharge Summary (Signed)
Patient was seen face to face for psychiatric evaluation, suicide risk assessment and case discussed with treatment team and NP and made appropriate disposition plans. Reviewed the information documented and agree with the treatment plan.   Madelin Weseman ,MD Attending Psychiatrist  [facility]    

## 2014-11-24 ENCOUNTER — Emergency Department (EMERGENCY_DEPARTMENT_HOSPITAL)
Admission: EM | Admit: 2014-11-24 | Discharge: 2014-11-25 | Disposition: A | Discharge status: Other Acute Inpatient Hospital | Payer: Medicaid Other | Source: Home / Self Care | Attending: Emergency Medicine | Admitting: Emergency Medicine

## 2014-11-24 ENCOUNTER — Encounter (HOSPITAL_COMMUNITY): Payer: Self-pay | Admitting: *Deleted

## 2014-11-24 ENCOUNTER — Encounter (HOSPITAL_COMMUNITY): Payer: Self-pay | Admitting: Emergency Medicine

## 2014-11-24 ENCOUNTER — Emergency Department (HOSPITAL_COMMUNITY)
Admission: EM | Admit: 2014-11-24 | Discharge: 2014-11-24 | Disposition: A | Discharge status: Other Acute Inpatient Hospital | Payer: Medicaid Other | Attending: Emergency Medicine | Admitting: Emergency Medicine

## 2014-11-24 DIAGNOSIS — F209 Schizophrenia, unspecified: Secondary | ICD-10-CM

## 2014-11-24 DIAGNOSIS — F203 Undifferentiated schizophrenia: Secondary | ICD-10-CM | POA: Diagnosis not present

## 2014-11-24 DIAGNOSIS — Z046 Encounter for general psychiatric examination, requested by authority: Secondary | ICD-10-CM | POA: Diagnosis present

## 2014-11-24 DIAGNOSIS — Z79899 Other long term (current) drug therapy: Secondary | ICD-10-CM | POA: Insufficient documentation

## 2014-11-24 LAB — CBC
HEMATOCRIT: 40.7 % (ref 39.0–52.0)
Hemoglobin: 13.8 g/dL (ref 13.0–17.0)
MCH: 29.8 pg (ref 26.0–34.0)
MCHC: 33.9 g/dL (ref 30.0–36.0)
MCV: 87.9 fL (ref 78.0–100.0)
PLATELETS: 242 10*3/uL (ref 150–400)
RBC: 4.63 MIL/uL (ref 4.22–5.81)
RDW: 13.1 % (ref 11.5–15.5)
WBC: 6.3 10*3/uL (ref 4.0–10.5)

## 2014-11-24 LAB — COMPREHENSIVE METABOLIC PANEL
ALT: 26 U/L (ref 17–63)
AST: 38 U/L (ref 15–41)
Albumin: 5 g/dL (ref 3.5–5.0)
Alkaline Phosphatase: 100 U/L (ref 38–126)
Anion gap: 9 (ref 5–15)
BUN: 13 mg/dL (ref 6–20)
CHLORIDE: 103 mmol/L (ref 101–111)
CO2: 27 mmol/L (ref 22–32)
Calcium: 9.4 mg/dL (ref 8.9–10.3)
Creatinine, Ser: 1.05 mg/dL (ref 0.61–1.24)
GFR calc Af Amer: >60 mL/min (ref >60)
GFR calc non Af Amer: >60 mL/min (ref >60)
GLUCOSE: 101 mg/dL — AB (ref 65–99)
POTASSIUM: 3.5 mmol/L (ref 3.5–5.1)
SODIUM: 139 mmol/L (ref 135–145)
Total Bilirubin: 0.3 mg/dL (ref 0.3–1.2)
Total Protein: 8.6 g/dL — ABNORMAL HIGH (ref 6.5–8.1)

## 2014-11-24 LAB — ETHANOL: Alcohol, Ethyl (B): <5 mg/dL (ref <5)

## 2014-11-24 LAB — RAPID URINE DRUG SCREEN, HOSP PERFORMED
Amphetamines: NOT DETECTED
Barbiturates: NOT DETECTED
Benzodiazepines: NOT DETECTED
COCAINE: NOT DETECTED
OPIATES: NOT DETECTED
Tetrahydrocannabinol: NOT DETECTED

## 2014-11-24 LAB — ACETAMINOPHEN LEVEL: Acetaminophen (Tylenol), Serum: <10 ug/mL — ABNORMAL LOW (ref 10–30)

## 2014-11-24 LAB — SALICYLATE LEVEL: Salicylate Lvl: <4 mg/dL (ref 2.8–30.0)

## 2014-11-24 MED ORDER — BENZTROPINE MESYLATE 1 MG PO TABS
0.5000 mg | ORAL_TABLET | Freq: Two times a day (BID) | ORAL | Status: DC
  Administered 2014-11-24: 16:00:00 via ORAL
  Administered 2014-11-24 – 2014-11-25 (×2): 0.5 mg via ORAL
  Filled 2014-11-24 (×3): qty 1

## 2014-11-24 MED ORDER — HALOPERIDOL 5 MG PO TABS
5.0000 mg | ORAL_TABLET | Freq: Two times a day (BID) | ORAL | Status: DC
  Administered 2014-11-24 – 2014-11-25 (×3): 5 mg via ORAL
  Filled 2014-11-24 (×3): qty 1

## 2014-11-24 MED ORDER — ACETAMINOPHEN 325 MG PO TABS: 650.0000 mg | ORAL_TABLET | Freq: Four times a day (QID) | ORAL | Status: DC | PRN

## 2014-11-24 MED ORDER — HYDROXYZINE HCL 25 MG PO TABS: 25.0000 mg | ORAL_TABLET | Freq: Four times a day (QID) | ORAL | Status: DC | PRN

## 2014-11-24 NOTE — ED Provider Notes (Signed)
CSN: [id number]     Arrival date & time 11/24/14  0107 History   First MD Initiated Contact with Patient 11/24/14 0153     Chief Complaint  Patient presents with  . Aggressive Behavior     (Consider location/radiation/quality/duration/timing/severity/associated sxs/prior Treatment) HPI Comments: Patient is here voluntarily after patient was aggressive, not down to 1 had taken.  He states he doesn't take the medicine because it makes her feel unwell.  He is not suicidal or homicidal, but with questioning, appears very guarded, and makes poor eye contact  The history is provided by the patient and the police.    Past Medical History  Diagnosis Date  . Schizophrenia, acute    History reviewed. No pertinent past surgical history. Family History  Problem Relation Age of Onset  . Family history unknown: Yes   Social History  Substance Use Topics  . Smoking status: Never Smoker   . Smokeless tobacco: None  . Alcohol Use: Yes    Review of Systems  Respiratory: Negative for cough.   Cardiovascular: Negative for chest pain.  Gastrointestinal: Negative for nausea.  Genitourinary: Negative for dysuria.  Neurological: Negative for dizziness and headaches.  Psychiatric/Behavioral: Positive for behavioral problems and agitation.  All other systems reviewed and are negative.     Allergies  Review of patient's allergies indicates no known allergies.  Home Medications   Prior to Admission medications   Medication Sig Start Date End Date Taking? Authorizing Provider  diphenhydrAMINE (BENADRYL) 25 mg capsule Take 1 capsule (25 mg total) by mouth 2 (two) times daily. 11/01/13  Yes Beau Fanny, FNP  haloperidol (HALDOL) 5 MG tablet Take 1 tablet (5 mg total) by mouth 2 (two) times daily. 11/01/13  Yes Beau Fanny, FNP  haloperidol lactate (HALDOL) 5 MG/ML injection Inject 5 mg into the skin every 30 (thirty) days.   Yes Historical Provider, MD   BP 140/83 mmHg  Pulse 72   Temp(Src) 98.2 F (36.8 C)  Resp 18  SpO2 99% Physical Exam  Constitutional: He appears well-developed and well-nourished.  HENT:  Head: Normocephalic.  Eyes: Pupils are equal, round, and reactive to light.  Neck: Normal range of motion.  Cardiovascular: Normal rate and regular rhythm.   Pulmonary/Chest: Effort normal and breath sounds normal.  Musculoskeletal: Normal range of motion.  Neurological: He is alert.  Skin: Skin is warm and dry.  Psychiatric: His affect is inappropriate. Thought content is not paranoid and not delusional. Cognition and memory are impaired. He expresses impulsivity. He expresses no homicidal and no suicidal ideation. He expresses no suicidal plans and no homicidal plans.  Nursing note and vitals reviewed.   ED Course  Procedures (including critical care time) Labs Review Labs Reviewed  COMPREHENSIVE METABOLIC PANEL - Abnormal; Notable for the following:    Glucose, Bld 101 (*)    Total Protein 8.6 (*)    All other components within normal limits  ACETAMINOPHEN LEVEL - Abnormal; Notable for the following:    Acetaminophen (Tylenol), Serum <10 (*)    All other components within normal limits  ETHANOL  SALICYLATE LEVEL  CBC    Imaging Review No results found. I have personally reviewed and evaluated these images and lab results as part of my medical decision-making.   EKG Interpretation None     Will have TTS evaluation  MDM   Final diagnoses:  None         Earley Favor, NP 11/25/14 [id number]  Dione Booze, MD 11/25/14 [phone]

## 2014-11-24 NOTE — ED Notes (Signed)
Pt has left the department. [name] NP aware. Magistrate called and mother is going to take out IVC papers.

## 2014-11-24 NOTE — BH Assessment (Signed)
Informed Sabino Dick, NP of treatment recommendation.
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
## 2014-11-24 NOTE — ED Notes (Signed)
Per [employer], pt from home, [employer] called by pt's mother d/t pt trying to kick the door in - pt has a hx of schizophrenia and has not been taking his medications x2 days b/c pt states "it makes me feel bloated" - pt currently here voluntarily - when attempting to question pt on reason for ER visit pt states "I've been crying." Pt denies SI/HI at this time.

## 2014-11-24 NOTE — ED Notes (Signed)
Pt brought  In by [employer]. Pt is IVC. Pt has HX of schizophrenia.  [employer] was called to home earlier today for patient kicking door in.Earlier he was here and left AMA.

## 2014-11-24 NOTE — BH Assessment (Signed)
Assessment completed. Consulted Charles Kober, PA-C who recommended inpatient treatment.  

## 2014-11-24 NOTE — ED Notes (Signed)
Patient flat, cooperative. Denies SI, HI, AVH. Denies feelings of anxiety and depression. Informed patient of his ordered medications and he expressed understanding.   Encouragement offered. Snacks provided.  Q 15 safety checks in place.
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
## 2014-11-24 NOTE — ED Notes (Signed)
Pt refusing to change into paper scrubs - requesting to leave facility - attempting to call his mother. Georgiann Hahn, NP made aware.

## 2014-11-24 NOTE — ED Notes (Signed)
Patient arrived to unit, appears to be guarded and paranoid of staff. RN offered pt a snack, to which he asked for cheese. Upon RN bringing cheese, pt states "I hate cheese, throw it away". Pt then laughing inappropriately at staff. Pt very malodorous and disheveled on assessment. Pt refusing to answer questions and appears to be responding to internal stimuli. No s/s of distress noted. Pt's eyes noted to have redness of the sclera.

## 2014-11-25 ENCOUNTER — Inpatient Hospital Stay (HOSPITAL_COMMUNITY)
Admission: AD | Admit: 2014-11-25 | Discharge: 2014-11-29 | DRG: 885 | Disposition: A | Discharge status: Home / Self Care | Payer: Medicaid Other | Attending: Psychiatry | Admitting: Psychiatry

## 2014-11-25 ENCOUNTER — Encounter (HOSPITAL_COMMUNITY): Payer: Self-pay | Admitting: *Deleted

## 2014-11-25 DIAGNOSIS — F17210 Nicotine dependence, cigarettes, uncomplicated: Secondary | ICD-10-CM | POA: Diagnosis present

## 2014-11-25 DIAGNOSIS — F1220 Cannabis dependence, uncomplicated: Secondary | ICD-10-CM | POA: Clinically undetermined

## 2014-11-25 DIAGNOSIS — F203 Undifferentiated schizophrenia: Secondary | ICD-10-CM

## 2014-11-25 DIAGNOSIS — Z9119 Patient's noncompliance with other medical treatment and regimen: Secondary | ICD-10-CM | POA: Diagnosis present

## 2014-11-25 DIAGNOSIS — Z720 Tobacco use: Secondary | ICD-10-CM | POA: Diagnosis not present

## 2014-11-25 DIAGNOSIS — F2089 Other schizophrenia: Principal | ICD-10-CM | POA: Diagnosis present

## 2014-11-25 DIAGNOSIS — E221 Hyperprolactinemia: Secondary | ICD-10-CM | POA: Diagnosis present

## 2014-11-25 DIAGNOSIS — F17200 Nicotine dependence, unspecified, uncomplicated: Secondary | ICD-10-CM | POA: Diagnosis present

## 2014-11-25 MED ORDER — ALUM & MAG HYDROXIDE-SIMETH 200-200-20 MG/5ML PO SUSP: 30.0000 mL | ORAL | Status: DC | PRN

## 2014-11-25 MED ORDER — HYDROXYZINE HCL 25 MG PO TABS
25.0000 mg | ORAL_TABLET | Freq: Four times a day (QID) | ORAL | Status: DC | PRN
  Administered 2014-11-26 – 2014-11-27 (×2): 25 mg via ORAL
  Filled 2014-11-25 (×2): qty 1

## 2014-11-25 MED ORDER — ACETAMINOPHEN 325 MG PO TABS: 650.0000 mg | ORAL_TABLET | Freq: Four times a day (QID) | ORAL | Status: DC | PRN

## 2014-11-25 MED ORDER — NICOTINE 21 MG/24HR TD PT24
21.0000 mg | MEDICATED_PATCH | Freq: Every day | TRANSDERMAL | Status: DC
  Administered 2014-11-26 – 2014-11-29 (×4): 21 mg via TRANSDERMAL
  Filled 2014-11-25 (×6): qty 1

## 2014-11-25 MED ORDER — BENZTROPINE MESYLATE 0.5 MG PO TABS
0.5000 mg | ORAL_TABLET | Freq: Two times a day (BID) | ORAL | Status: DC
  Administered 2014-11-25 – 2014-11-29 (×8): 0.5 mg via ORAL
  Filled 2014-11-25 (×13): qty 1

## 2014-11-25 MED ORDER — MAGNESIUM HYDROXIDE 400 MG/5ML PO SUSP: 30.0000 mL | Freq: Every day | ORAL | Status: DC | PRN

## 2014-11-25 MED ORDER — HALOPERIDOL 5 MG PO TABS
5.0000 mg | ORAL_TABLET | Freq: Two times a day (BID) | ORAL | Status: DC
  Administered 2014-11-25 – 2014-11-29 (×8): 5 mg via ORAL
  Filled 2014-11-25 (×13): qty 1

## 2014-11-25 MED ORDER — NICOTINE 21 MG/24HR TD PT24
21.0000 mg | MEDICATED_PATCH | Freq: Every day | TRANSDERMAL | Status: DC
  Administered 2014-11-25: 21 mg via TRANSDERMAL
  Filled 2014-11-25: qty 1
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
## 2014-11-25 NOTE — Progress Notes (Signed)
Spoke with patient in the dayroom while he was watching football   He was pleasant and answered questions appropriately   Informed pt that if he needs something to help him go back to sleep i have it for him  He agreed he might need something but then went to bed without getting it   Will continue to do 15 min checks on pt and monitor for safety
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
## 2014-11-25 NOTE — Tx Team (Signed)
Initial Interdisciplinary Treatment Plan   PATIENT STRESSORS: Marital or family conflict Medication change or noncompliance   PATIENT STRENGTHS: Average or above average intelligence Physical Health Supportive family/friends   PROBLEM LIST: Problem List/Patient Goals Date to be addressed Date deferred Reason deferred Estimated date of resolution  Psychosis 11/25/2014     Aggressive behavior 11/25/2014     Family conflict 11/25/2014     Medication noncompliance 11/25/2014                                    DISCHARGE CRITERIA:  Improved stabilization in mood, thinking, and/or behavior Motivation to continue treatment in a less acute level of care  PRELIMINARY DISCHARGE PLAN: Outpatient therapy Return to previous living arrangement  PATIENT/FAMIILY INVOLVEMENT: This treatment plan has been presented to and reviewed with the patient, Hameed Mazzoni.  The patient and family have been given the opportunity to ask questions and make suggestions.  Cranford Mon 11/25/2014, 4:34 PM

## 2014-11-25 NOTE — BH Assessment (Signed)
[facility] Assessment Progress Note  Per [name], MD, this pt requires psychiatric hospitalization at this time.  Pt presents under IVC initiated by his mother and upheld by Leata Mouse, MD.  Thurman Coyer, RN, [facility] has assigned pt to Rm [id number].  Pt verbally consented to release of information to [facility] and to his aunt, but then refused to sign the appropriate form.  IVC paperwork has been faxed to [facility].  Pt's, Dawnaly, nurse has been notified and agrees to call report to [phone].  Pt is to be transported via [employer].  Doylene Canning, MA Triage Specialist [phone]

## 2014-11-25 NOTE — ED Notes (Signed)
Patient transferred to [facility].  Left the unit ambulatory with [employer].  All belongings given to the transport officers.  

## 2014-11-25 NOTE — [facility] Counselor (Signed)
Adult Comprehensive Assessment  Patient ID: [name] Love, male DOB: 1995/04/28, 19 y.o. MRN: [id number]  Information Source: Information source: Patient   Current Stressors:   Employment:  Unable to work  Is applying for disability.  Has been denied once, and has no retained an attorney Financial:  No income.  Dependent on family for support  Living/Environment/Situation:  Living Arrangements: Parent;Other relatives (Mother and 17yo sister and 18yo brother) Living conditions (as described by patient or guardian): Has his own room How long has patient lived in current situation?: His whole life What is atmosphere in current home: Loving;Comfortable;Supportive  Family History:  Marital status: Single Does patient have children?: No  Childhood History:  By whom was/is the patient raised?: Mother Additional childhood history information: Does not know who his father is. Description of patient's relationship with caregiver when they were a child: Good Patient's description of current relationship with people who raised him/her: Pretty good relationship with his mother. Finds her to be supportive.  Does patient have siblings?: Yes Number of Siblings: 2 (17o sister, 18o brother) Description of patient's current relationship with siblings: They all get along well. Did patient suffer any verbal/emotional/physical/sexual abuse as a child?: No Did patient suffer from severe childhood neglect?: No Has patient ever been sexually abused/assaulted/raped as an adolescent or adult?: No Was the patient ever a victim of a crime or a disaster?: No Witnessed domestic violence?: No Has patient been effected by domestic violence as an adult?: No  Education:  Highest grade of school patient has completed: 12th Currently a student?: No Learning disability?: No  Employment/Work Situation:  Employment situation: Unemployed What is the longest time patient has a held a job?: a few  months Where was the patient employed at that time?: factory Has patient ever been in the [employer]?: No Has patient ever served in Buyer, retail?: No  Financial Resources:  Surveyor, quantity resources: Support from parents / caregiver  Alcohol/Substance Abuse:  What has been your use of drugs/alcohol within the last 12 months?: None If attempted suicide, did drugs/alcohol play a role in this?: No Alcohol/Substance Abuse Treatment Hx: Denies past history Has alcohol/substance abuse ever caused legal problems?: No  Social Support System:  Conservation officer, nature Support System: Good Describe Community Support System: Mother, sister, brother Type of faith/religion: None How does patient's faith help to cope with current illness?: N/A  Leisure/Recreation:  Leisure and Hobbies: Listens to music, watches moviesit."  Strengths/Needs:  What things does the patient do well?: "Anything really" In what areas does patient struggle / problems for patient: "Nowhere."  Discharge Plan:  Does patient have access to transportation?: Yes Will patient be returning to same living situation after discharge?: Yes Currently receiving community mental health services: No If no, would patient like referral for services when discharged?: No Does patient have financial barriers related to discharge medications?: Yes Patient description of barriers related to discharge medications: "It would be up to my mom."  Summary/Recommendations:  Summary and Recommendations (to be completed by the evaluator): This is a 19 African-American male who was hospitalized with a  diagnosis of Schizophrenia, having disorganized behavior, auditory hallucinations, and aggressive behavior toward his family, the same symptoms he was exhibiting a year ago when admitted.  He admits to not following through at [facility] and with his meds for the past couple of months.He was attending a [facility] program for awhile, but did not like it as no one  there was his age.  He is open to another referral if it is  a better fit. He has limited insight into his illness. He would benefit from safety monitoring, medication evaluation, psychoeducation, group therapy, and discharge planning to link with ongoing resources.   Daryel Gerald 11/27/2014

## 2014-11-25 NOTE — Progress Notes (Signed)
Psychoeducational Group Note  Date:  11/25/2014 Time:  2309  Group Topic/Focus:  Wrap-Up Group:   The focus of this group is to help patients review their daily goal of treatment and discuss progress on daily workbooks.  Participation Level: Did Not Attend  Participation Quality:  Not Applicable  Affect:  Not Applicable  Cognitive:  Not Applicable  Insight:  Not Applicable  Engagement in Group: Not Applicable  Additional Comments:  The patient did not attend group this evening.   Hazle Coca S 11/25/2014, 11:09 PM

## 2014-11-25 NOTE — ED Notes (Signed)
Mother phone number [phone], [name].

## 2014-11-25 NOTE — Progress Notes (Signed)
D    Pt has been in bed resting with eyes closed since the beginning of the shift  Attempted to wake pt but he had minimal response and became irritated  A   Verbal support given  Offered medications   Q 15 min checks  R   Pt safe at present

## 2014-11-26 ENCOUNTER — Encounter (HOSPITAL_COMMUNITY): Payer: Self-pay | Admitting: Psychiatry

## 2014-11-26 DIAGNOSIS — F17200 Nicotine dependence, unspecified, uncomplicated: Secondary | ICD-10-CM | POA: Diagnosis present

## 2014-11-26 DIAGNOSIS — F203 Undifferentiated schizophrenia: Secondary | ICD-10-CM

## 2014-11-26 DIAGNOSIS — F1220 Cannabis dependence, uncomplicated: Secondary | ICD-10-CM | POA: Clinically undetermined

## 2014-11-26 DIAGNOSIS — Z720 Tobacco use: Secondary | ICD-10-CM

## 2014-11-26 MED ORDER — HYDROCORTISONE 1 % EX CREA
TOPICAL_CREAM | Freq: Three times a day (TID) | CUTANEOUS | Status: DC
Start: 2014-11-26 — End: 2014-11-29
  Administered 2014-11-26 – 2014-11-29 (×7): via TOPICAL
  Filled 2014-11-26: qty 28

## 2014-11-26 MED ORDER — TRAZODONE HCL 50 MG PO TABS
50.0000 mg | ORAL_TABLET | Freq: Every evening | ORAL | Status: DC | PRN
  Administered 2014-11-26: 50 mg via ORAL
  Filled 2014-11-26: qty 1

## 2014-11-26 NOTE — [facility] Suicide Risk Assessment (Signed)
[facility] Admission Suicide Risk Assessment   Nursing information obtained from:    Demographic factors:    Current Mental Status:    Loss Factors:    Historical Factors:    Risk Reduction Factors:    Total Time spent with patient: 30 minutes Principal Problem: Schizophrenia, acute undifferentiated Diagnosis:   Patient Active Problem List   Diagnosis Date Noted  . 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  World Science writer [facility]). Score between 0-7:  no or low risk or alcohol related problems. Score between 8-15:  moderate risk of alcohol related problems. Score between 16-19:  high risk of alcohol related problems. Score 20 or above:  warrants further diagnostic evaluation for alcohol dependence and treatment.   CLINICAL FACTORS:   Schizophrenia:   Less than 92 years old Paranoid or undifferentiated type     Psychiatric Specialty Exam: Physical Exam  ROS  Blood pressure 128/65, pulse 85, temperature 97.9 F (36.6 C), temperature source Oral, resp. rate 16, height  (1.803 m), weight 88.905 kg (196 lb), SpO2 98 %.Body mass index is 27.35 kg/(m^2).                    Please see H&P.                                      COGNITIVE FEATURES THAT CONTRIBUTE TO RISK:  Closed-mindedness, Polarized thinking and Thought constriction (tunnel vision)    SUICIDE RISK:   Mild:  Suicidal ideation of limited frequency, intensity, duration, and specificity.  There are no identifiable plans, no associated intent, mild dysphoria and related symptoms, good self-control (both objective and subjective assessment), few other risk factors, and identifiable protective factors, including available and accessible social support.  PLAN OF CARE: Please  see H&P.    Medical Decision Making:  Review of Psycho-Social Stressors (1), Review or order clinical lab tests (1), Established Problem, Worsening (2), Review of Last Therapy Session (1), Review of Medication Regimen & Side Effects (2) and Review of New Medication or Change in Dosage (2)  I certify that inpatient services furnished can reasonably be expected to improve the patient's condition.   Devora Tortorella md 11/26/2014, 12:16 PM

## 2014-11-26 NOTE — Progress Notes (Signed)
DAR Note: Patient presents with depressed mood and affect.  Denies pain, auditory and visual hallucinations.  Rates depression at 3/10, hopelessness at 7/10.  States goal for today is "homework."  Maintained on routine safety checks.  Medication given as prescribed.  Attended all group therapy.  Engaged minimally in groups.  Support and encouragement offered as needed.

## 2014-11-26 NOTE — [facility] Group Notes (Signed)
[facility] Group Notes:  (Nursing/MHT/Case Management/Adjunct)  Date:  11/26/2014  Time:0930 Type of Therapy:  Nurse Education  Participation Level:  Minimal  Participation Quality:  Appropriate and Attentive  Affect:  Appropriate  Cognitive:  Alert and Appropriate  Insight:  Appropriate and Good  Engagement in Group:  Engaged  Modes of Intervention:  Discussion, Education and Exploration  Summary of Progress/Problems: Patient attended group.  Minimally engaged.  Brief eye contact.  Only contributed when prompted.  States goal is to take care of himself. Mickie Bail 11/26/2014, 5:26 PM
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
## 2014-11-26 NOTE — [facility] Group Notes (Signed)
[facility] LCSW Group Therapy  11/26/2014 1:15 pm  Type of Therapy: Process Group Therapy  Participation Level:  Active  Participation Quality:  Appropriate  Affect:  Flat  Cognitive:  Oriented  Insight:  Improving  Engagement in Group:  Limited  Engagement in Therapy:  Limited  Modes of Intervention:  Activity, Clarification, Education, Problem-solving and Support  Summary of Progress/Problems: Today's group addressed the issue of overcoming obstacles.  Patients were asked to identify their biggest obstacle post d/c that stands in the way of their on-going success, and then problem solve as to how to manage this.  Stayed the entire time.  Appeared to be paying attention.  Declined to contribute.    [name] 11/26/2014   5:34 PM

## 2014-11-26 NOTE — Progress Notes (Signed)
Adult Psychoeducational Group Note  Date:  11/26/2014 Time:  8:15 PM  Group Topic/Focus:  Wrap-Up Group:   The focus of this group is to help patients review their daily goal of treatment and discuss progress on daily workbooks.  Participation Level:  Minimal  Participation Quality:  Appropriate  Affect:  Appropriate  Cognitive:  Appropriate  Insight: Good  Engagement in Group:  Engaged  Modes of Intervention:  Discussion  Additional Comments:  Pt rated his overall day a 6 out of 10. Pt reported that positive points of his day was the good food and watching tv. Pt stated that he did not have a goal for the day. Pt remained pleasant and calm regardless of other patients interrupting his turn to speak.   Cleotilde Neer 11/26/2014, 8:20 PM

## 2014-11-27 LAB — LIPID PANEL
CHOLESTEROL: 250 mg/dL — AB (ref 0–200)
HDL: 68 mg/dL (ref >40)
LDL Cholesterol: 164 mg/dL — ABNORMAL HIGH (ref 0–99)
TRIGLYCERIDES: 90 mg/dL (ref <150)
Total CHOL/HDL Ratio: 3.7 RATIO
VLDL: 18 mg/dL (ref 0–40)

## 2014-11-27 LAB — TSH: TSH: 1.831 u[IU]/mL (ref 0.350–4.500)

## 2014-11-27 MED ORDER — HALOPERIDOL DECANOATE 100 MG/ML IM SOLN
100.0000 mg | INTRAMUSCULAR | Status: DC
  Administered 2014-11-28: 100 mg via INTRAMUSCULAR
  Filled 2014-11-27: qty 1

## 2014-11-27 NOTE — Plan of Care (Signed)
Problem: Alteration in thought process Goal: LTG-Patient verbalizes understanding importance med regimen (Patient verbalizes understanding of importance of medication regimen and need to continue outpatient care.)  Outcome: Progressing Reviewed medication regimen and education provided on the importance of taking medications on a regular basis.

## 2014-11-27 NOTE — [facility] Group Notes (Signed)
[facility] LCSW Aftercare Discharge Planning Group Note   11/27/2014 11:02 AM  Participation Quality:  Active   Mood/Affect:  Flat  Depression Rating:    Anxiety Rating:    Thoughts of Suicide:  No Will you contract for safety?   NA  Current AVH:  No  Plan for Discharge/Comments:  Pt denies all symptoms. He was alert and oriented in group but was mostly quiet.  Transportation Means:   Supports:  Jonathon Jordan
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
## 2014-11-27 NOTE — Progress Notes (Signed)
Adult Psychoeducational Group Note  Date:  11/27/2014 Time:  8:20 PM  Group Topic/Focus:  Wrap-Up Group:   The focus of this group is to help patients review their daily goal of treatment and discuss progress on daily workbooks.  Participation Level:  Minimal  Participation Quality:  Appropriate  Affect:  Appropriate  Cognitive:  Appropriate  Insight: Appropriate  Engagement in Group:  Limited  Modes of Intervention:  Discussion  Additional Comments:  Pt rated overall day a 3 out of 10. Pt reported that nothing positive happened today other than the good food that he ate. Pt also reported that he feels that he achieved his goal for the day which was to "heal his skin" from eczema.   Cleotilde Neer 11/27/2014, 8:31 PM

## 2014-11-27 NOTE — Progress Notes (Signed)
DAR Note: [name] Love has been pleasant and cooperative.  He denies any SI/HI or A/V hallucinations.  He got up for morning med pass without difficulty.  He completed his self inventory and reported depression 0/10, hopelessness 10/10 and anxiety 5/10.  He denies any physical complaints.  He appears to be in no physical distress.  He denied any suicidal thoughts during morning med pass but stated that he was having some thoughts noted on his self inventory.  He is able to contract for safety on the unit.  His goal for today is to improve his health and feels that he will be able to accomplish this by at least taking a shower.  He attended morning group but didn't attend afternoon group.  Encouraged him to participate in group/unit activities.  Q 15 minute checks maintained for safety.

## 2014-11-27 NOTE — [facility] Group Notes (Signed)
[facility] LCSW Group Therapy  11/27/2014 2:38 PM  Type of Therapy: Group Therapy  Participation Level: Invited Chose not to attend.  Vito Backers. Beverely Pace 11/27/2014 2:38 PM

## 2014-11-28 LAB — PROLACTIN: PROLACTIN: 35.2 ng/mL — AB (ref 4.0–15.2)

## 2014-11-28 LAB — HEMOGLOBIN A1C
HEMOGLOBIN A1C: 5.7 % — AB (ref 4.8–5.6)
MEAN PLASMA GLUCOSE: 117 mg/dL

## 2014-11-28 MED ORDER — ARIPIPRAZOLE 5 MG PO TABS
5.0000 mg | ORAL_TABLET | Freq: Every day | ORAL | Status: DC
  Administered 2014-11-28: 5 mg via ORAL
  Filled 2014-11-28 (×3): qty 1

## 2014-11-28 NOTE — Progress Notes (Signed)
Nutrition Education Note  RD consulted for nutrition education regarding hyperlipidemia.  Lipid Panel     Component Value Date/Time   CHOL 250* 11/27/2014 0700   TRIG 90 11/27/2014 0700   HDL 68 11/27/2014 0700   CHOLHDL 3.7 11/27/2014 0700   VLDL 18 11/27/2014 0700   LDLCALC 164* 11/27/2014 0700    RD provided "High Triglycerides Nutrition Therapy" handout from the Academy of Nutrition and Dietetics. Reviewed patient's dietary recall. Provided examples on ways to decrease sugar and fat intake in diet. Discouraged intake of processed foods and consumption of sugar-sweetened beverages. Encouraged fresh fruits and vegetables as well as whole grain sources of carbohydrates to maximize fiber intake. Teach back method used.  Pt seemed annoyed by conversation and by recommendations by RD to incorporate more fruits and vegetables in his diet and cut back on fried foods and those high in fat. Pt reported that he ate pizza, [place] fries, and chocolate cake for lunch. H states that he eats items such as these in addition to hot dogs at home and does not have access to fruits and vegetables; he does state he has eaten carrots since admission.   Expect fair to poor compliance.  Body mass index is 27.35 kg/(m^2). Pt meets criteria for overweight based on current BMI.  Diet Order: Diet regular Room service appropriate?: Yes; Fluid consistency:: Thin Pt is also offered choice of unit snacks mid-morning and mid-afternoon.  Pt is eating as desired.   Labs and medications reviewed. No further nutrition interventions warranted at this time. If additional nutrition issues arise, please re-consult RD.     Trenton Gammon, RD, LDN Inpatient Clinical Dietitian Pager # [phone] After hours/weekend pager # [phone]

## 2014-11-28 NOTE — Progress Notes (Signed)
Did not attend Karaoke wrap up group, remained on the unit and was in between his room and the day room

## 2014-11-28 NOTE — [facility] Group Notes (Signed)
[facility] LCSW Group Therapy  11/28/2014 2:03 PM  Type of Therapy: Process Group Therapy  Participation Level: Active  Participation Quality: Appropriate  Affect: Flat  Cognitive: Oriented  Insight: Improving  Engagement in Group: Limited  Engagement in Therapy: Limited  Modes of Intervention: Activity, Clarification, Education, Problem-solving and Support  Summary of Progress/Problems: Today's group focused on the topic of community.  Pt was alert and appropriate throughout the group. Pt spoke about being a part of his church community and how that is a helpful coping skill for him.  Also admitted he did not choose to come to this community, but that it can be beneficial even when someone does not volunteer to join.  However, unable to give any examples of how it has been helpful.  [name] Love. Beverely Pace, MSW Intern 11/28/2014 2:03 PM

## 2014-11-28 NOTE — Progress Notes (Signed)
D: Pt with a blank affect complained of mild anxiety. He states, "Can I get some medicine, I can feel my chest is starting to go fast; you said yesterday I should come to you as soon as it starts." Pt also endorses auditory hallucination but denies command auditory hallucinations. He states, "The voices are telling me that I need to take a bath and brush my teeth. Pt continues to deny depression, pain, SI, and HI. Pt continues to be cooperative through the assessment precess.  A: Medications offered as prescribed. Support, encouragement, and safe environment provided.   R: Pt was med compliant.  Pt attend wrap-up group. 15-minute safety check continues.
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
## 2014-11-28 NOTE — Plan of Care (Signed)
Problem: Ineffective individual coping Goal: STG: Patient will remain free from self harm Outcome: Progressing No thoughts of wanting to harm self.  We will continue to monitor the progress towards his goals.

## 2014-11-28 NOTE — [facility] Group Notes (Signed)
[facility] Group Notes:  (Nursing/MHT/Case Management/Adjunct)  Date:  11/28/2014  Time:  11:17 AM  Type of Therapy:  Nurse Education  Participation Level:  Minimal  Participation Quality:  Attentive  Affect:  Blunted  Cognitive:  Alert and Appropriate  Insight:  Limited  Engagement in Group:  Engaged  Modes of Intervention:  Discussion and Education  Summary of Progress/Problems:  Group topic was leisure and lifestyle changes.  Discussed goal setting and the importance in improving/developing useful coping skills.  [name] Love attended group.  He was able to answer questions appropriately with the group topic.  He stated that he likes to watch movies in his spare time.    Norm Parcel Parmvir Boomer 11/28/2014, 11:17 AM

## 2014-11-28 NOTE — Progress Notes (Signed)
DAR Note: [name] Love has been visible on the unit.  He attended groups.  He denies any SI/HI or A/V hallucinations.  He completed his self inventory and reports that his depression is 5/10, hopelessness 5/10 and his anxiety was 5/10.  He denies any physical complaints.  He appears to be in no physical distress. Order for Haldol Decanoate administered in left deltoid.  He tolerated well.  Medication education provided for new medication and explained to him that when he is discharged he will follow up with his OP provider and they will continue his medication there.  He was noted to have body odor.  Encouraged him to shower and put deodorant on.  He states that he would.  15 minute checks maintained for safety.  Continued to encouraged participation in group/unit activities.
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
## 2014-11-28 NOTE — [facility] Suicide Risk Assessment (Signed)
[facility] INPATIENT:  Family/Significant Other Suicide Prevention Education  Suicide Prevention Education:  Education Completed; No one has been identified by the patient as the family member/significant other with whom the patient will be residing, and identified as the person(s) who will aid the patient in the event of a mental health crisis (suicidal ideations/suicide attempt).  With written consent from the patient, the family member/significant other has been provided the following suicide prevention education, prior to the and/or following the discharge of the patient.  The suicide prevention education provided includes the following:  Suicide risk factors  Suicide prevention and interventions  National Suicide Hotline telephone number  [facility] assessment telephone number  [facility] Emergency Assistance 911  [facility] and/or Residential Mobile Crisis Unit telephone number  Request made of family/significant other to:  Remove weapons (e.g., guns, rifles, knives), all items previously/currently identified as safety concern.    Remove drugs/medications (over-the-counter, prescriptions, illicit drugs), all items previously/currently identified as a safety concern.  The family member/significant other verbalizes understanding of the suicide prevention education information provided.  The family member/significant other agrees to remove the items of safety concern listed above. The patient did not endorse SI at the time of admission, nor did the patient c/o SI during the stay here.  SPE not required. However, I did talk to mother, Ms Jodelle Green, [place] [address] to talk about crises plan.  Daryel Gerald B 11/28/2014, 12:44 PM

## 2014-11-28 NOTE — Progress Notes (Signed)
D: Pt presents flat and guarded this evening. Pt denied any concerns he wished for this writer to address at this time. Despite encouragement, pt did not go to [employer]. Pt denied any SI/HI/AVH.  A: Writer administered scheduled medications to pt, per MD orders. Continued support and availability as needed was extended to this pt. Staff continue to monitor pt with q75min checks.  R: No adverse drug reactions noted. Pt receptive to treatment. Pt remains safe at this time.

## 2014-11-29 DIAGNOSIS — E221 Hyperprolactinemia: Secondary | ICD-10-CM | POA: Insufficient documentation

## 2014-11-29 MED ORDER — ARIPIPRAZOLE 5 MG PO TABS: 5.0000 mg | ORAL_TABLET | Freq: Every day | ORAL | Status: DC

## 2014-11-29 MED ORDER — HALOPERIDOL 5 MG PO TABS: 5.0000 mg | ORAL_TABLET | Freq: Two times a day (BID) | ORAL | Status: DC

## 2014-11-29 MED ORDER — HALOPERIDOL DECANOATE 100 MG/ML IM SOLN: 100.0000 mg | INTRAMUSCULAR | Status: DC

## 2014-11-29 MED ORDER — BENZTROPINE MESYLATE 0.5 MG PO TABS: 0.5000 mg | ORAL_TABLET | Freq: Two times a day (BID) | ORAL | Status: DC

## 2014-11-29 NOTE — [facility] Group Notes (Signed)
[facility] LCSW Aftercare Discharge Planning Group Note   11/29/2014 10:43 AM  Participation Quality:  Invited. Chose not to attend.   [name] Love

## 2014-11-29 NOTE — Progress Notes (Signed)
  [facility] Adult Case Management Discharge Plan :  Will you be returning to the same living situation after discharge:  Yes,  home with family At discharge, do you have transportation home?: Yes,  TCT available to transport if needed, can be given bus pass Do you have the ability to pay for your medications: Yes,  has Medicaid  Release of information consent forms completed and in the chart;  Patient's signature needed at discharge.  Patient to Follow up at: Follow-up Information    Follow up with [facility] On 12/02/2014.   Why:  Monday at 2:20 with the doctor.  the [facility] TCT team would be glad to give you a ride.  Call them at [phone]   Contact information:   [address]  [place]  [phone]      Follow up with [facility]  On 12/04/2014.   Why:  Wednesday at 9:00 to see their PSR program.  The [facility] TCT team would be glad to give you a ride.  You can call them at [phone]   Contact information:   [address] [facility] [address] [phone]      Safety Planning and Suicide Prevention discussed: Yes,  reviewed w patient, pt declined collateral contact  Have you used any form of tobacco in the last 30 days? (Cigarettes, Smokeless Tobacco, Cigars, and/or Pipes): Yes  Has patient been referred to the Quitline?: Patient refused referral  [name] 11/29/2014, 10:14 AM

## 2014-11-29 NOTE — Discharge Summary (Signed)
Physician Discharge Summary Note  [name] is an 19 y.o., male MRN:  [id number] DOB:  1995/12/17 Patient phone:  [phone] (home)  Patient address:   [address] [place] [address],  Total Time spent with patient: 30 minutes  Date of Admission:  11/25/2014 Date of Discharge: 11/29/2014  Reason for Admission:  psychosis  Principal Problem: Schizophrenia, acute undifferentiated Discharge Diagnoses: Patient Active Problem List   Diagnosis Date Noted  . 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 Cannabis use disorder, severe, dependence [F12.20] 11/26/2014  . Tobacco use disorder [Z72.0] 11/26/2014  . 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 Psychiatric: His mood appears anxious. Thought content is not paranoid. He expresses no homicidal and no suicidal ideation.    Review of Systems  Psychiatric/Behavioral: Negative for depression, suicidal ideas and hallucinations.  All other systems reviewed and are negative.   Blood pressure 121/80, pulse 98, temperature 98 F (36.7 C), temperature source Oral, resp. rate 16, height  (1.803 m), weight 88.905 kg (196 lb), SpO2 98 %.Body mass index is 27.35 kg/(m^2).  Have you used any form of tobacco in the last 30 days? (Cigarettes, Smokeless Tobacco, Cigars, and/or Pipes): Yes  Has this patient used any form of tobacco in the last 30 days? (Cigarettes, Smokeless Tobacco, Cigars, and/or Pipes) N/A  Past Medical History:  Past Medical History  Diagnosis Date  . Schizophrenia, acute    History reviewed. No pertinent past surgical history. Family History:  Family History  Problem Relation Age of Onset  . Mental illness Neg Hx    Social History:  History  Alcohol Use  . Yes     History  Drug Use No    Social History    Social History  . Marital Status: Single    Spouse Name: N/A  . Number of Children: N/A  . Years of Education: N/A   Social History Main Topics  . Smoking status: Never Smoker   . Smokeless tobacco: None  . Alcohol Use: Yes  . Drug Use: No  . Sexual Activity: Not Asked   Other Topics Concern  . None   Social History Narrative   Risk to Self: Is patient at risk for suicide?: No Risk to Others:   Prior Inpatient Therapy:   Prior Outpatient Therapy:    Level of Care:  OP  Hospital Course:  [name] Love was admitted for Schizophrenia, acute undifferentiated and crisis management.  He was treated discharged with the medications listed below under Medication List.  Medical problems were identified and treated as needed.  Home medications were restarted as appropriate.  Improvement was monitored by observation and [name] Love daily report of symptom reduction.  Emotional and mental status was monitored by daily self-inventory reports completed by [name] Love and clinical staff.         [name] Love was evaluated by the treatment team for stability and plans for continued recovery upon discharge.  [name] Love motivation was an integral factor for scheduling further treatment.  Employment, transportation, bed availability, health status, family support, and any pending legal issues were also considered during his hospital stay.  He was offered further treatment options upon discharge including but not limited to Residential, Intensive Outpatient, and Outpatient treatment.  [name] Love will follow up with the services as listed below under Follow Up  Information.     Upon completion of this admission the patient was both mentally and medically stable for discharge denying suicidal/homicidal ideation, auditory/visual/tactile hallucinations, delusional thoughts and paranoia.      Consults:  psychiatry  Significant Diagnostic Studies:  labs: per ED  Discharge Vitals:   Blood pressure  121/80, pulse 98, temperature 98 F (36.7 C), temperature source Oral, resp. rate 16, height 5\' 11"  (1.803 m), weight 88.905 kg (196 lb), SpO2 98 %. Body mass index is 27.35 kg/(m^2). Lab Results:   Results for orders placed or performed during the hospital encounter of 11/25/14 (from the past 72 hour(s))  TSH     Status: None   Collection Time: 11/27/14  7:00 AM  Result Value Ref Range   TSH 1.831 0.350 - 4.500 uIU/mL    Comment: Performed at [facility]  Lipid panel     Status: Abnormal   Collection Time: 11/27/14  7:00 AM  Result Value Ref Range   Cholesterol 250 (H) 0 - 200 mg/dL   Triglycerides 90 <161 mg/dL   HDL 68 >09 mg/dL   Total CHOL/HDL Ratio 3.7 RATIO   VLDL 18 0 - 40 mg/dL   LDL Cholesterol 604 (H) 0 - 99 mg/dL    Comment:        Total Cholesterol/HDL:CHD Risk Coronary Heart Disease Risk Table                     Men   Women  1/2 Average Risk   3.4   3.3  Average Risk       5.0   4.4  2 X Average Risk   9.6   7.1  3 X Average Risk  23.4   11.0        Use the calculated Patient Ratio above and the CHD Risk Table to determine the patient's CHD Risk.        ATP III CLASSIFICATION (LDL):  <100     mg/dL   Optimal  540-981  mg/dL   Near or Above                    Optimal  130-159  mg/dL   Borderline  191-478  mg/dL   High  >295     mg/dL   Very High Performed at [facility]   Hemoglobin A1c     Status: Abnormal   Collection Time: 11/27/14  7:00 AM  Result Value Ref Range   Hgb A1c MFr Bld 5.7 (H) 4.8 - 5.6 %    Comment: (NOTE)         Pre-diabetes: 5.7 - 6.4         Diabetes: >6.4         Glycemic control for adults with diabetes: <7.0    Mean Plasma Glucose 117 mg/dL    Comment: (NOTE) Performed At: [facility] [address] [place] [address] [place], [place] [id number] Mila Homer MD QI:[id number] Performed at [facility]   Prolactin     Status: Abnormal   Collection Time: 11/27/14  7:00 AM  Result Value  Ref Range   Prolactin 35.2 (H) 4.0 - 15.2 ng/mL    Comment: (NOTE) Performed At: [facility] [address] [place], [place] [id number] Mila Homer MD OZ:[id number] Performed at [facility]     Physical Findings: AIMS: Facial and Oral Movements Muscles of Facial Expression: None, normal Lips and Perioral Area: None, normal Jaw:  None, normal Tongue: None, normal,Extremity Movements Upper (arms, wrists, hands, fingers): None, normal Lower (legs, knees, ankles, toes): None, normal, Trunk Movements Neck, shoulders, hips: None, normal, Overall Severity Severity of abnormal movements (highest score from questions above): None, normal Incapacitation due to abnormal movements: None, normal Patient's awareness of abnormal movements (rate only patient's report): No Awareness, Dental Status Current problems with teeth and/or dentures?: No Does patient usually wear dentures?: No  CIWA:    COWS:      See Psychiatric Specialty Exam and Suicide Risk Assessment completed by Attending Physician prior to discharge.  Discharge destination:  Home  Is patient on multiple antipsychotic therapies at discharge:  YES   Has Patient had three or more failed trials of antipsychotic monotherapy by history:  NO    Recommended Plan for Multiple Antipsychotic Therapies: Abilify added for hyperprolactinemia.  Outpatient provider to make further recommendations     Medication List    STOP taking these medications        diphenhydrAMINE 25 mg capsule  Commonly known as:  BENADRYL     haloperidol lactate 5 MG/ML injection  Commonly known as:  HALDOL      TAKE these medications      Indication   ARIPiprazole 5 MG tablet  Commonly known as:  ABILIFY  Take 1 tablet (5 mg total) by mouth at bedtime.   Indication:  mood stabilization     benztropine 0.5 MG tablet  Commonly known as:  COGENTIN  Take 1 tablet (0.5 mg total) by mouth 2 (two) times daily.    Indication:  Extrapyramidal Reaction caused by Medications     haloperidol 5 MG tablet  Commonly known as:  HALDOL  Take 1 tablet (5 mg total) by mouth 2 (two) times daily.   Indication:  mood stabilization     haloperidol decanoate 100 MG/ML injection  Commonly known as:  HALDOL DECANOATE  Inject 1 mL (100 mg total) into the muscle every 30 (thirty) days. Last dose given 11/28/14.  Next dose due needs to be given on 12/27/14.   Indication:  mood stabilization           Follow-up Information    Follow up with [facility] On 12/02/2014.   Why:  Monday at 2:20 with the doctor.  the [place] TCT team would be glad to give you a ride.  Call them at [phone]   Contact information:   [address]  [place]  [[phone]      Follow up with [facility]  On 12/04/2014.   Why:  Wednesday at 9:00 to see their PSR program.  The [place] TCT team would be glad to give you a ride.  You can call them at [phone]   Contact information:   [address]  [place] [address] [phone]      Follow-up recommendations:  Activity:  as tol Diet:  as tol  Comments:  1.  Take all your medications as prescribed.              2.  Report any adverse side effects to outpatient provider.                       3.  Patient instructed to not use alcohol or illegal drugs while on prescription medicines.            4.  In the event of worsening symptoms, instructed patient to call 911, the  crisis hotline or go to nearest emergency room for evaluation of symptoms.  Total Discharge Time: 30 min  Signed: Velna Hatchet May Agustin AGNP-BC 11/29/2014, 11:23 AM

## 2014-11-29 NOTE — [facility] Group Notes (Signed)
[facility] LCSW Group Therapy   11/29/2014 1:42 PM  Type of Therapy: Group Therapy  Participation Level:  Active  Participation Quality:  Attentive  Affect:  Flat  Cognitive:  Oriented  Insight:  Limited  Engagement in Therapy:  Engaged  Modes of Intervention:  Discussion and Socialization  Summary of Progress/Problems: Chaplain was here to lead a group on themes of hope and/or courage.  Pt was alert throughout the group. Pt shared with the group that memories are important to him and give him hope when he is going through a hard time. Pt was also able to sympathize with other group members.  [name] Love 11/29/2014 1:42 PM

## 2014-11-29 NOTE — Progress Notes (Signed)
Pt. D/C from the unit to lobby accompanied by his mother. [name] was pleasant and cooperative. Voiced no SI/HI or A/V halluciations.  Pt. Denies any pain or discomfort.  D/C instructions and medications reviewed with pt.  Prescriptions sent with [place]  He verbalized understanding of medications and d/c instructions.   All belongings (from locker 42) returned to pt. Q 15 min checks maintained until discharge.  Pt. Left the unit in no apparent distress.

## 2014-11-29 NOTE — [facility] Suicide Risk Assessment (Signed)
[facility] Discharge Suicide Risk Assessment   Demographic Factors:  Male  Total Time spent with patient: 30 minutes  Musculoskeletal: Strength & Muscle Tone: within normal limits Gait & Station: normal Patient leans: N/A  Psychiatric Specialty Exam: Physical Exam  Review of Systems  Psychiatric/Behavioral: Positive for substance abuse. Negative for depression, suicidal ideas and hallucinations. The patient is not nervous/anxious and does not have insomnia.   All other systems reviewed and are negative.   Blood pressure 121/80, pulse 98, temperature 98 F (36.7 C), temperature source Oral, resp. rate 16, height 5\' 11"  (1.803 m), weight 88.905 kg (196 lb), SpO2 98 %.Body mass index is 27.35 kg/(m^2).  General Appearance: Casual  Eye Contact::  Fair  Speech:  Normal Rate409  Volume:  Normal  Mood:  Euthymic  Affect:  Appropriate  Thought Process:  Coherent  Orientation:  Full (Time, Place, and Person)  Thought Content:  WDL  Suicidal Thoughts:  No  Homicidal Thoughts:  No  Memory:  Immediate;   Fair Recent;   Fair Remote;   Fair  Judgement:  Fair  Insight:  Fair  Psychomotor Activity:  Normal  Concentration:  Fair  Recall:  [employer] of Knowledge:Fair  Language: Fair  Akathisia:  No  Handed:  Right  AIMS (if indicated):     Assets:  Communication Skills Desire for Improvement  Sleep:  Number of Hours: 5.5  Cognition: WNL  ADL's:  Intact   Have you used any form of tobacco in the last 30 days? (Cigarettes, Smokeless Tobacco, Cigars, and/or Pipes): Yes  Has this patient used any form of tobacco in the last 30 days? (Cigarettes, Smokeless Tobacco, Cigars, and/or Pipes) Yes, Prescription  provided for nicotine patch  Mental Status Per Nursing Assessment::   On Admission:     Current Mental Status by Physician: pt denies SI/HI/AH/VH  Loss Factors: NA  Historical Factors: Impulsivity  Risk Reduction Factors:   Positive coping skills or problem solving  skills  Continued Clinical Symptoms:  Alcohol/Substance Abuse/Dependencies Unstable or Poor Therapeutic Relationship Previous Psychiatric Diagnoses and Treatments  Cognitive Features That Contribute To Risk:  None    Suicide Risk:  Minimal: No identifiable suicidal ideation.  Patients presenting with no risk factors but with morbid ruminations; may be classified as minimal risk based on the severity of the depressive symptoms  Principal Problem: Schizophrenia, acute undifferentiated Discharge Diagnoses:  Patient Active Problem List   Diagnosis Date Noted  . Cannabis use disorder, severe, dependence [F12.20] 11/26/2014  . Tobacco use disorder [Z72.0] 11/26/2014  . 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   Why:  Monday at 2:20 with the doctor.  the [place] TCT team would be glad to give you a ride.  Call them at [phone]   Contact information:   [address]  [place]  [336] [phone]      Follow up with [facility]  On 12/04/2014.   Why:  Wednesday at 9:00 to see their PSR program.  The [place] TCT team would be glad to give you a ride.  You can call them at [phone]   Contact information:   [address]  [place] [address] [336] [phone]      Plan Of Care/Follow-up recommendations:  Activity:  No restrictions Diet:  regular Tests:  Prolactin level needs to be followed as per out patient recommendations Other:  follow up with after care  Is patient on multiple antipsychotic therapies at discharge:  Yes,   Do you recommend tapering to monotherapy for antipsychotics?  Yes   Has Patient had three or more failed trials of antipsychotic monotherapy by history:  No  Recommended Plan for Multiple Antipsychotic Therapies: Taper to monotherapy as described:  Abilify added due to hyperprolactinemia    Eappen,Saramma md 11/29/2014, 10:01 AM

## 2015-01-29 ENCOUNTER — Encounter (HOSPITAL_COMMUNITY): Payer: Self-pay | Admitting: Emergency Medicine

## 2015-01-29 ENCOUNTER — Emergency Department (HOSPITAL_COMMUNITY)
Admission: EM | Admit: 2015-01-29 | Discharge: 2015-01-31 | Disposition: A | Discharge status: Other Acute Inpatient Hospital | Payer: Federal, State, Local not specified - Other | Attending: Emergency Medicine | Admitting: Emergency Medicine

## 2015-01-29 DIAGNOSIS — R4689 Other symptoms and signs involving appearance and behavior: Secondary | ICD-10-CM

## 2015-01-29 DIAGNOSIS — F911 Conduct disorder, childhood-onset type: Secondary | ICD-10-CM | POA: Insufficient documentation

## 2015-01-29 DIAGNOSIS — F203 Undifferentiated schizophrenia: Secondary | ICD-10-CM

## 2015-01-29 DIAGNOSIS — F1220 Cannabis dependence, uncomplicated: Secondary | ICD-10-CM | POA: Diagnosis present

## 2015-01-29 DIAGNOSIS — E221 Hyperprolactinemia: Secondary | ICD-10-CM | POA: Diagnosis present

## 2015-01-29 DIAGNOSIS — F17200 Nicotine dependence, unspecified, uncomplicated: Secondary | ICD-10-CM | POA: Diagnosis present

## 2015-01-29 DIAGNOSIS — F1210 Cannabis abuse, uncomplicated: Secondary | ICD-10-CM | POA: Insufficient documentation

## 2015-01-29 LAB — CBC
HCT: 43.2 % (ref 39.0–52.0)
HEMOGLOBIN: 14.8 g/dL (ref 13.0–17.0)
MCH: 29.8 pg (ref 26.0–34.0)
MCHC: 34.3 g/dL (ref 30.0–36.0)
MCV: 87.1 fL (ref 78.0–100.0)
PLATELETS: 252 10*3/uL (ref 150–400)
RBC: 4.96 MIL/uL (ref 4.22–5.81)
RDW: 12.7 % (ref 11.5–15.5)
WBC: 6.7 10*3/uL (ref 4.0–10.5)

## 2015-01-29 LAB — COMPREHENSIVE METABOLIC PANEL
ALT: 24 U/L (ref 17–63)
AST: 35 U/L (ref 15–41)
Albumin: 5.1 g/dL — ABNORMAL HIGH (ref 3.5–5.0)
Alkaline Phosphatase: 98 U/L (ref 38–126)
Anion gap: 10 (ref 5–15)
BUN: 16 mg/dL (ref 6–20)
CHLORIDE: 104 mmol/L (ref 101–111)
CO2: 24 mmol/L (ref 22–32)
Calcium: 9.5 mg/dL (ref 8.9–10.3)
Creatinine, Ser: 1.08 mg/dL (ref 0.61–1.24)
Glucose, Bld: 110 mg/dL — ABNORMAL HIGH (ref 65–99)
POTASSIUM: 3.7 mmol/L (ref 3.5–5.1)
SODIUM: 138 mmol/L (ref 135–145)
Total Bilirubin: 0.6 mg/dL (ref 0.3–1.2)
Total Protein: 8.6 g/dL — ABNORMAL HIGH (ref 6.5–8.1)

## 2015-01-29 LAB — RAPID URINE DRUG SCREEN, HOSP PERFORMED
AMPHETAMINES: NOT DETECTED
BENZODIAZEPINES: NOT DETECTED
Barbiturates: NOT DETECTED
Cocaine: NOT DETECTED
OPIATES: NOT DETECTED
TETRAHYDROCANNABINOL: POSITIVE — AB

## 2015-01-29 LAB — ETHANOL

## 2015-01-29 LAB — ACETAMINOPHEN LEVEL: Acetaminophen (Tylenol), Serum: <10 ug/mL — ABNORMAL LOW (ref 10–30)

## 2015-01-29 LAB — SALICYLATE LEVEL

## 2015-01-29 MED ORDER — ONDANSETRON HCL 4 MG PO TABS: 4.0000 mg | ORAL_TABLET | Freq: Three times a day (TID) | ORAL | Status: DC | PRN

## 2015-01-29 MED ORDER — ACETAMINOPHEN 325 MG PO TABS: 650.0000 mg | ORAL_TABLET | ORAL | Status: DC | PRN

## 2015-01-29 MED ORDER — IBUPROFEN 200 MG PO TABS: 600.0000 mg | ORAL_TABLET | Freq: Three times a day (TID) | ORAL | Status: DC | PRN

## 2015-01-29 MED ORDER — HALOPERIDOL 5 MG PO TABS
5.0000 mg | ORAL_TABLET | Freq: Two times a day (BID) | ORAL | Status: DC
  Administered 2015-01-29 – 2015-01-30 (×2): 5 mg via ORAL
  Filled 2015-01-29 (×3): qty 1

## 2015-01-29 MED ORDER — ARIPIPRAZOLE 5 MG PO TABS
5.0000 mg | ORAL_TABLET | Freq: Every day | ORAL | Status: DC
  Administered 2015-01-29: 5 mg via ORAL
  Filled 2015-01-29 (×3): qty 1

## 2015-01-29 MED ORDER — BENZTROPINE MESYLATE 1 MG PO TABS
0.5000 mg | ORAL_TABLET | Freq: Two times a day (BID) | ORAL | Status: DC
  Administered 2015-01-29 – 2015-01-31 (×5): 0.5 mg via ORAL
  Filled 2015-01-29 (×5): qty 1
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
## 2015-01-29 NOTE — ED Notes (Signed)
Pt very slow to answer questions, staring intently at my shirt then quickly at the walls. Pt denies SI/HI, denies taking or needing medication, denies any aggressive behavior today. Denies A/V hallucination. Very poor hygiene.
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
## 2015-01-30 DIAGNOSIS — F203 Undifferentiated schizophrenia: Secondary | ICD-10-CM | POA: Diagnosis not present

## 2015-01-30 MED ORDER — OXCARBAZEPINE 300 MG PO TABS
300.0000 mg | ORAL_TABLET | Freq: Two times a day (BID) | ORAL | Status: DC
  Administered 2015-01-30 – 2015-01-31 (×4): 300 mg via ORAL
  Filled 2015-01-30 (×4): qty 1

## 2015-01-30 MED ORDER — ARIPIPRAZOLE 5 MG PO TABS
5.0000 mg | ORAL_TABLET | Freq: Two times a day (BID) | ORAL | Status: DC
  Administered 2015-01-30 – 2015-01-31 (×3): 5 mg via ORAL
  Filled 2015-01-30 (×4): qty 1

## 2015-01-30 NOTE — Progress Notes (Signed)
CSW reached out to patient's mom to get collateral information regarding patient and to see if she has a psychological document for patient. However, she did not answer the phone. CSW left a message asking for a call back.  Jene EveryMom/Tasha Whitley, [phone]   Crista CurbBrittney Damon Hargrove, [place] [phone] ED CSW 01/30/2015 12:55 PM
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
## 2015-01-30 NOTE — ED Notes (Signed)
Pt AAO x 3, no distress noted,  Pt sleeping at present,  Monitoring for safety, Q 15 min checks in effect.

## 2015-01-31 ENCOUNTER — Inpatient Hospital Stay (HOSPITAL_COMMUNITY)
Admission: AD | Admit: 2015-01-31 | Discharge: 2015-02-07 | DRG: 885 | Disposition: A | Discharge status: Home / Self Care | Payer: Medicaid Other | Attending: Psychiatry | Admitting: Psychiatry

## 2015-01-31 DIAGNOSIS — F17210 Nicotine dependence, cigarettes, uncomplicated: Secondary | ICD-10-CM | POA: Diagnosis present

## 2015-01-31 DIAGNOSIS — F209 Schizophrenia, unspecified: Secondary | ICD-10-CM | POA: Diagnosis present

## 2015-01-31 DIAGNOSIS — F17200 Nicotine dependence, unspecified, uncomplicated: Secondary | ICD-10-CM | POA: Diagnosis not present

## 2015-01-31 DIAGNOSIS — Z9114 Patient's other noncompliance with medication regimen: Secondary | ICD-10-CM | POA: Diagnosis not present

## 2015-01-31 DIAGNOSIS — F1220 Cannabis dependence, uncomplicated: Secondary | ICD-10-CM | POA: Diagnosis not present

## 2015-01-31 DIAGNOSIS — E221 Hyperprolactinemia: Secondary | ICD-10-CM | POA: Diagnosis not present

## 2015-01-31 DIAGNOSIS — F200 Paranoid schizophrenia: Secondary | ICD-10-CM | POA: Diagnosis not present

## 2015-01-31 NOTE — ED Notes (Signed)
Snack and beverage given. 

## 2015-01-31 NOTE — ED Notes (Signed)
Reported to Berneice [name] RN, [facility] at [facility] that pt is refusing to take a shower.

## 2015-01-31 NOTE — ED Notes (Signed)
Pt. Noted in room. No complaints or concerns voiced. No distress or abnormal behavior noted. Will continue to monitor with security cameras. Q 15 minute rounds continue. 

## 2015-01-31 NOTE — ED Notes (Signed)
Report received from Diane RN. Pt. Alert and oriented in no distress denies SI, HI, AVH and pain.  Pt. Instructed to come to me with problems or concerns.Will continue to monitor for safety via security cameras and Q 15 minute checks.

## 2015-01-31 NOTE — BH Assessment (Signed)
[facility] Assessment Progress Note  Per [name] MinsMojeed Akintayo, MD, this pt requires psychiatric hospitalization at this time.  [name] [name], RN, [facility] has assigned pt to Rm 506-1.  Pt refuses to sign Voluntary Admission and Consent for Treatment, but has signed Consent to Release Information to [facility], [facility], and a notification call has been placed.  EDP [name] HutchingBrian Cook, MD, has initiated IVC and petition has been faxed to the magistrate.  At 15:33 Hart CarwinMagistrate Haynes confirms receipt.  As of this writing service of Findings and Custody Order is pending.  Signed consent form has been [facility] to [facility].  Pt's nurse, Carlisle BeersLuann, has been notified, and agrees to send original paperwork along with pt via law enforcement, and to call report to [phone].  [name], MA Triage Specialist [phone]
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
## 2015-01-31 NOTE — ED Notes (Signed)
Pt. Noted sleeping in room. No complaints or concerns voiced. No distress or abnormal behavior noted. Will continue to monitor with security cameras. Q 15 minute rounds continue. 

## 2015-02-01 ENCOUNTER — Encounter (HOSPITAL_COMMUNITY): Payer: Self-pay | Admitting: Nurse Practitioner

## 2015-02-01 DIAGNOSIS — F200 Paranoid schizophrenia: Secondary | ICD-10-CM

## 2015-02-01 MED ORDER — NICOTINE POLACRILEX 2 MG MT GUM
2.0000 mg | CHEWING_GUM | OROMUCOSAL | Status: DC | PRN
  Administered 2015-02-01 – 2015-02-07 (×5): 2 mg via ORAL
  Filled 2015-02-01 (×5): qty 1

## 2015-02-01 MED ORDER — HALOPERIDOL 5 MG PO TABS
5.0000 mg | ORAL_TABLET | Freq: Every day | ORAL | Status: DC
  Administered 2015-02-01 – 2015-02-02 (×2): 5 mg via ORAL
  Filled 2015-02-01 (×5): qty 1

## 2015-02-01 MED ORDER — ACETAMINOPHEN 325 MG PO TABS: 650.0000 mg | ORAL_TABLET | Freq: Four times a day (QID) | ORAL | Status: DC | PRN

## 2015-02-01 MED ORDER — BENZTROPINE MESYLATE 1 MG PO TABS
1.0000 mg | ORAL_TABLET | Freq: Two times a day (BID) | ORAL | Status: DC | PRN
  Filled 2015-02-01: qty 14

## 2015-02-01 MED ORDER — MAGNESIUM HYDROXIDE 400 MG/5ML PO SUSP: 30.0000 mL | Freq: Every day | ORAL | Status: DC | PRN

## 2015-02-01 MED ORDER — HYDROXYZINE HCL 25 MG PO TABS
25.0000 mg | ORAL_TABLET | Freq: Four times a day (QID) | ORAL | Status: DC | PRN
  Filled 2015-02-01: qty 10

## 2015-02-01 MED ORDER — ALUM & MAG HYDROXIDE-SIMETH 200-200-20 MG/5ML PO SUSP: 30.0000 mL | ORAL | Status: DC | PRN

## 2015-02-01 NOTE — Progress Notes (Signed)
D- Patient appears flat and blunted.  Patient minimizes his reason for being here stating that he has no idea why he is here.  Patient currently denies SI, HI, AVH, and pain. However, patient has delayed responses and appears to be responding to internal stimuli.  Patient had EKG completed per MD order.  Results placed in patient's chart.  Patient reports hx of smoking and was ordered gum to help with withdrawal symptoms.  Gum effective in offering relief.  On patient's self-inventory sheet, patient rates his depression, feelings of hopelessness, and his anxiety all "5/10" with 10 being the worst.  Patient's goal for today is to "communicate".   A- Support and encouragement provided.  Routine safety checks conducted every 15 minutes.  Patient informed to notify staff with problems or concerns. R- Patient contracts for safety at this time. Patient compliant with treatment plan. Patient receptive, calm, and cooperative. Patient interacts well with others on the unit.  Patient remains safe at this time.

## 2015-02-01 NOTE — [facility] Group Notes (Signed)
[facility] Group Notes:  (Nursing/MHT/Case Management/Adjunct)  Date:  02/01/2015  Time:  11:19 AM  Type of Therapy:  Psychoeducational Skills  Participation Level:  Did Not Attend  Participation Quality:  Did Not Attend  Affect:  Did Not Attend  Cognitive:  Did Not Attend  Insight:  None  Engagement in Group:  Did Not Attend  Modes of Intervention:  Did Not Attend  Summary of Progress/Problems: Pt did not attend patient self inventory group.    Jacquelyne BalintForrest, Kazue Cerro Shanta 02/01/2015, 11:19 AM

## 2015-02-01 NOTE — [facility] Suicide Risk Assessment (Addendum)
[facility] Admission Suicide Risk Assessment   Nursing information obtained from:  Patient Demographic factors:  Male, Adolescent or young adult, Gay, lesbian, or bisexual orientation, Low socioeconomic status, Unemployed Current Mental Status:  Suicidal ideation indicated by others Loss Factors:  Financial problems / change in socioeconomic status Historical Factors:  NA Risk Reduction Factors:  Living with another person, especially a relative, Positive social support Total Time spent with patient: 45 minutes Principal Problem: Schizophrenia (HCC) Diagnosis:   Patient Active Problem List   Diagnosis Date Noted  . Schizophrenia (HCC) [F20.9] 02/01/2015  . Aggressive behavior [F60.89]   . 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 Cannabis use disorder, severe, dependence (HCC) [F12.20] 11/26/2014  . Tobacco use disorder [F17.200] 11/26/2014  . 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  World Science writer [facility]). Score between 0-7:  no or low risk or alcohol related problems. Score between 8-15:  moderate risk of alcohol related problems. Score between 16-19:  high risk of alcohol related problems. Score 20 or above:  warrants further diagnostic evaluation for alcohol dependence and treatment.   CLINICAL FACTORS:  19 year old man, who lives with his mother and brother in a hotel. He has been diagnosed with Schizophrenia in the past . He smokes cannabis daily. He was committed because of concerns that he was increasingly irritable and angry. He states he had stopped his psychiatric medications a few days ago, because " because they were not helping me ".( Haldol, Abilify)  He states he did have an altercation with brother, but states he was not aggressive or violent towards  anybody else.  He states he has had hallucinations in the past, but not recently.  He does describe some paranoid ideations, feeling that people are staring at him and making faces at him for no apparent reason, and reports he often thinks that conversations  Between people are about him.  Denies any  Depression or suicidal ideations.  Dx-  Schizophrenia  By history, Cannabis Dependence Plan- Admit to inpatient psychiatric unit  Agrees to restart Haldol , will restart at 5 mgrs QHS . Also , will start Cogentin 1 mgr BID PRN for dystonia if needed  Obtain routine EKG to monitor QTc     Musculoskeletal: Strength & Muscle Tone: within normal limits Gait & Station: normal Patient leans: N/A  Psychiatric Specialty Exam: Physical Exam  ROS denies headache, denies chest pain, denies shortness of breath, denies nausea, denies vomiting, denies rash  Blood pressure 124/58, pulse 120, temperature 98.3 F (36.8 C), temperature source Oral, resp. rate 16, height 6' (1.829 m), weight 205 lb (92.987 kg).Body mass index is 27.8 kg/(m^2).  General Appearance: Well Groomed  Patent attorney::  Good  Speech:  Normal Rate  Volume:  Normal  Mood:  Anxious  Affect:  Congruent- mildly anxious   Thought Process:  Linear  Orientation:  Full (Time, Place, and Person)  Thought Content:  no hallucinations, (+ ) paranoid ideations  Suicidal Thoughts:  No denies any thoughts of hurting self, any thoughts of SI,  or any  Thoughts of violence towards one else   Homicidal Thoughts:  No denies any plans  Memory:  recent and remote grossly intact   Judgement:  Fair  Insight:  Fair  Psychomotor Activity:  Normal  Concentration:  Good  Recall:  Good  Fund of Knowledge:Good  Language: Good  Akathisia:  Negative  Handed:  Right  AIMS (if indicated):     Assets:  Communication Skills Desire for Improvement Resilience Social Support  Sleep:  Number of Hours: 5.25  Cognition: WNL  ADL's:  Fair      COGNITIVE  FEATURES THAT CONTRIBUTE TO RISK:  Closed-mindedness and Loss of executive function    SUICIDE RISK:   Moderate:  Frequent suicidal ideation with limited intensity, and duration, some specificity in terms of plans, no associated intent, good self-control, limited dysphoria/symptomatology, some risk factors present, and identifiable protective factors, including available and accessible social support.  PLAN OF CARE: Patient will be admitted to inpatient psychiatric unit for stabilization and safety. Will provide and encourage milieu participation. Provide medication management and maked adjustments as needed.  Will follow daily.    Medical Decision Making:  Review of Psycho-Social Stressors (1), Review or order clinical lab tests (1), Established Problem, Worsening (2) and Review of Medication Regimen & Side Effects (2)  I certify that inpatient services furnished can reasonably be expected to improve the patient's condition.   COBOS, FERNANDO 02/01/2015, 1:53 PM

## 2015-02-01 NOTE — Progress Notes (Signed)
ADMISSION NOTE:  [name] Love has been admitted and oriented to the unit. He is minimizing his substance abuse (marijuana) as well as alcohol use compared to previous notes. He states he only smokes marijuana once a month and denies any alcohol use. He denies SI/HI/AVH at this time and contracts for safety. Rates Anxiety and Depression 5/10. He denies any physical, verbal or sexual abuse although he hesitated prior to his denial of such. His affect is somewhat childlike. He smiles inappropriately during assessment and also smirks as well when answering questions related to smoking and drinking alcohol. I went over the unit rules with [name] Love. When I asked if he had any questions, he states he was confused about the no sexual activity on the unit and if that applied to the same sex. I reiterated that no sexual activity means no sexual activity regardless of orientation. I questioned whether [name] Love knew the reason he had been admitted and he states "yes because of me and my brother". His mother is his main contact as well as only support system. He has no additional questions at this time.

## 2015-02-01 NOTE — Progress Notes (Signed)
D: [name] Love is minimally interactive. Rates Anxiety 7/10 Depression 3/10. He states he feels much better today. He continues to smile at times when sitting alone in his room as if responding to internal stimuli. However he denies SI/HI/AVH. A: Encouragement and support given. R: Continue to monitor for patient safety and medication effectiveness.

## 2015-02-01 NOTE — Tx Team (Signed)
Initial Interdisciplinary Treatment Plan   PATIENT STRESSORS: Financial difficulties Substance abuse   PATIENT STRENGTHS: General fund of knowledge Motivation for treatment/growth Physical Health   PROBLEM LIST: Problem List/Patient Goals Date to be addressed Date deferred Reason deferred Estimated date of resolution  "Better communication" 02-01-2015     "Take care of myself; physically and mentally.  02-01-2015     Substance Abuse 02-01-2015     Schizophrenia 02-01-2015     Depression  02-01-2015                              DISCHARGE CRITERIA:  Ability to meet basic life and health needs Adequate post-discharge living arrangements Improved stabilization in mood, thinking, and/or behavior Medical problems require only outpatient monitoring Motivation to continue treatment in a less acute level of care Need for constant or close observation no longer present Reduction of life-threatening or endangering symptoms to within safe limits Verbal commitment to aftercare and medication compliance  PRELIMINARY DISCHARGE PLAN: Attend aftercare/continuing care group Outpatient therapy  PATIENT/FAMIILY INVOLVEMENT: This treatment plan has been presented to and reviewed with the patient, [name] Love.  The patient and family have been given the opportunity to ask questions and make suggestions.  Claiborne RiggZelda W Fleming 02/01/2015, 1:04 AM

## 2015-02-01 NOTE — [facility] Group Notes (Signed)
[facility] Group Notes:  (Clinical Social Work)  02/01/2015  11:15-12:00PM  Summary of Progress/Problems:   Today's process group involved patients discussing their feelings related to being hospitalized, as well as how they want to feel in order to be ready to discharge.  It was agreed in general by the group that it would be preferable to avoid future hospitalizations, and there was a discussion about what each person will need to do to achieve that.   Problems related to adherence to medication recommendations were discussed, as well as importance of developing friendships and supports.  The patient expressed his primary feeling about being hospitalized is "no feelings" although he initially said that his father died in a hospital when he was 1yo, so he is scared of being in the hospital.  Type of Therapy:  Group Therapy - Process  Participation Level:  Active  Participation Quality:  Attentive  Affect:  Appropriate  Cognitive:  Alert  Insight:  Developing/Improving  Engagement in Therapy:  Developing/Improving  Modes of Intervention:  Exploration, Discussion  Ambrose MantleMareida Grossman-Orr, LCSW 02/01/2015, 12:57 PM

## 2015-02-01 NOTE — [facility] Group Notes (Signed)
[facility] Group Notes:  (Nursing/MHT/Case Management/Adjunct)  Date:  02/01/2015  Time:  11:31 AM  Type of Therapy:  Psychoeducational Skills  Participation Level:  Did Not Attend  Participation Quality:  Did Not Attend  Affect:  Did Not Attend  Cognitive:  Did Not Attend  Insight:  None  Engagement in Group:  Did Not Attend  Modes of Intervention:  Did Not Attend  Summary of Progress/Problems: Pt did not attend healthy coping skills group.   Jacquelyne BalintForrest, Anaya Bovee Shanta 02/01/2015, 11:31 AM

## 2015-02-01 NOTE — H&P (Signed)
Psychiatric Admission Assessment Adult  Patient Identification: [name] Love MRN:  [id number] Date of Evaluation:  02/01/2015 Chief Complaint:  Psychosis "I'm doing fine"   Principal Diagnosis: Schizophrenia ([place]) Diagnosis:   Patient Active Problem List   Diagnosis Date Noted  . Schizophrenia ([place]) [F20.9] 02/01/2015    Priority: High  . Aggressive behavior [F60.89]   . 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 Cannabis use disorder, severe, dependence ([place]) [F12.20] 11/26/2014  . Tobacco use disorder [F17.200] 11/26/2014  . 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 Patient was calm but was not able to participate fully with the interview. Patient stated that he came to the ER because " I was fighting with my brother. He has an attitude" Patient was brought in by [facility] after an altercation with his brother. Patient answered most of the questions with "no, I don't know" He stated that he has a mental illness and has stopped taking medications for 2 months. See Collateral information from the mother gotten by TTS staff. Per information, patient has a hx of Schizophrenia and was seeing a Psychiatrist taking medications. He stopped taking medications and could not see his provider when he turned 19 and lost his Medicaid. Patient does not know his diagnosis or medications he takes. He reports poor sleep since stopping his medications. He has been accepted for admission and we will be seeking placement at any facility with available beds."  Today, on 02/01/15, pt seen and chart reviewed for H&P. Pt is alert/oriented x4, calm, cooperative, and appropriate. However, pt intermittently stares at the wall as if he is responding to internal stimuli, although pt denies this. Pt denies suicidal/homicidal ideation as well and there is no objective evidence for either of those  ideations at this time. Pt cites good sleep and appetite and is participating in the milieu. Pt has a history of inpatient admissions and ED visits for schizophrenia with at least some of the causal factors stemming from medication non-compliance. Pt is observed to be sitting in his room, quiet, and smells as though he has not bathed in a few days. Nursing staff are encouraging him to bathe.   Elements:  Location:  psychosis. Quality:  see above. Severity:  severe. Timing:  acute- past 2 months. Duration:  acute. Context:  hx of schizophrenia , noncompiance on medications. Associated Signs/Symptoms: Depression Symptoms:  denies (Hypo) Manic Symptoms:  Hallucinations, Impulsivity, Irritable Mood, yet is minimizing this when asked.  Anxiety Symptoms:  denies Psychotic Symptoms:  Denies yet may be present PTSD Symptoms: NA Total Time spent with patient: 45 minutes  Past Medical History:  Past Medical History  Diagnosis Date  . Schizophrenia, acute ([place])    Family History:  Family History  Problem Relation Age of Onset  . Mental illness Neg Hx    Social History:  History  Alcohol Use No     History  Drug Use  . Yes  . Special: Marijuana    Comment: per mother    Social History   Social History  . Marital Status: Single    Spouse Name: N/A  . Number of Children: N/A  . Years of Education: N/A   Social History Main Topics  . Smoking status: Current Every Day Smoker -- 0.25 packs/day    Types: Cigarettes  . Smokeless tobacco: None  . Alcohol Use: No  . Drug Use: Yes    Special:  Marijuana     Comment: per mother  . Sexual Activity: Not Currently   Other Topics Concern  . None   Social History Narrative   Additional Social History:    History of alcohol / drug use?: Yes      Patient graduated from [place]. Pt lives with his mother. Pt used to work at [employer] , but quit because he wanted to stay positive.               Musculoskeletal: Strength & Muscle  Tone: within normal limits Gait & Station: normal Patient leans: N/A  Psychiatric Specialty Exam: Physical Exam  Constitutional: He is oriented to person, place, and time. He appears well-developed and well-nourished.  HENT:  Head: Normocephalic and atraumatic.  Eyes: Conjunctivae and EOM are normal.  Neck: Normal range of motion. Neck supple. No thyromegaly present.  Cardiovascular: Normal rate and regular rhythm.   Respiratory: Effort normal and breath sounds normal.  GI: Soft.  Genitourinary:  Denies any concerns   Musculoskeletal: Normal range of motion. He exhibits no edema.  Neurological: He is alert and oriented to person, place, and time.  Skin: Skin is warm.  Psychiatric: His speech is normal and behavior is normal. His mood appears anxious. His affect is labile. Thought content is paranoid. Cognition and memory are normal. He expresses impulsivity.    Review of Systems  Psychiatric/Behavioral: Positive for depression, hallucinations and substance abuse. Negative for suicidal ideas. The patient is nervous/anxious and has insomnia.   All other systems reviewed and are negative.   Blood pressure 124/58, pulse 120, temperature 98.3 F (36.8 C), temperature source Oral, resp. rate 16, height 6' (1.829 m), weight 92.987 kg (205 lb).Body mass index is 27.8 kg/(m^2).  General Appearance: Fairly Groomed and Guarded  Engineer, water::  Fair  Speech:  Clear and Coherent  Volume:  Normal  Mood:  Anxious and Irritable  Affect:  Non-Congruent and Inappropriate  Thought Process:  Intact  Orientation:  Full (Time, Place, and Person)  Thought Content:  Paranoid Ideation, Rumination and and very guarded  Suicidal Thoughts:  No  Homicidal Thoughts:  No  Memory:  Immediate;   Fair Recent;   Fair Remote;   Fair  Judgement:  Impaired  Insight:  Shallow  Psychomotor Activity:  Restlessness  Concentration:  Poor  Recall:  [employer] of Knowledge:Poor  Language: Fair  Akathisia:  No   Handed:  Right  AIMS (if indicated):     Assets:  Desire for Improvement  ADL's:  Intact  Cognition: WNL  Sleep:  Number of Hours: 5.25   Risk to Self: Is patient at risk for suicide?: No Risk to Others:  yes  Prior Inpatient Therapy:  yes, [facility] Prior Outpatient Therapy:  [facility]  Alcohol Screening: 1. How often do you have a drink containing alcohol?: Never 2. How many drinks containing alcohol do you have on a typical day when you are drinking?: 1 or 2 3. How often do you have six or more drinks on one occasion?: Never Preliminary Score: 0 4. How often during the last year have you found that you were not able to stop drinking once you had started?: Never 5. How often during the last year have you failed to do what was normally expected from you becasue of drinking?: Never 6. How often during the last year have you needed a first drink in the morning to get yourself going after a heavy drinking session?: Never 7. How often during the  last year have you had a feeling of guilt of remorse after drinking?: Never 9. Have you or someone else been injured as a result of your drinking?: No 10. Has a relative or friend or a doctor or another health worker been concerned about your drinking or suggested you cut down?: No Alcohol Use Disorder Identification Test Final Score (AUDIT): 0 Brief Intervention: AUDIT score less than 7 or less-screening does not suggest unhealthy drinking-brief intervention not indicated  Allergies:   Allergies  Allergen Reactions  . Pollen Extract Other (See Comments)    sneezing  . Peanuts [Peanut Oil] Rash   Lab Results: No results found for this or any previous visit (from the past 48 hour(s)). Current Medications: Current Facility-Administered Medications  Medication Dose Route Frequency Provider Last Rate Last Dose  . acetaminophen (TYLENOL) tablet 650 mg  650 mg Oral Q6H PRN Harriet Butte, NP      . alum & mag hydroxide-simeth (MAALOX/MYLANTA)  200-200-20 MG/5ML suspension 30 mL  30 mL Oral Q4H PRN Harriet Butte, NP      . magnesium hydroxide (MILK OF MAGNESIA) suspension 30 mL  30 mL Oral Daily PRN Harriet Butte, NP       PTA Medications: Prescriptions prior to admission  Medication Sig Dispense Refill Last Dose  . ARIPiprazole (ABILIFY) 5 MG tablet Take 1 tablet (5 mg total) by mouth at bedtime. (Patient not taking: Reported on 01/29/2015) 30 tablet 0 Not Taking at Unknown time  . benztropine (COGENTIN) 0.5 MG tablet Take 1 tablet (0.5 mg total) by mouth 2 (two) times daily. (Patient not taking: Reported on 01/29/2015) 60 tablet 0 Not Taking at Unknown time  . haloperidol (HALDOL) 5 MG tablet Take 1 tablet (5 mg total) by mouth 2 (two) times daily. (Patient not taking: Reported on 01/29/2015) 60 tablet 0 Not Taking at Unknown time  . haloperidol decanoate (HALDOL DECANOATE) 100 MG/ML injection Inject 1 mL (100 mg total) into the muscle every 30 (thirty) days. Last dose given 11/28/14.  Next dose due needs to be given on 12/27/14. (Patient not taking: Reported on 01/29/2015) 1 mL 0 Not Taking at Unknown time    Previous Psychotropic Medications: Yes , haldol  Substance Abuse History in the last 12 months:  Yes.  cannabis daily abuse per initial notes in EHR, pt denies , Tobacco use .    Consequences of Substance Abuse: Medical Consequences:  recent admission Family Consequences:  relational struggle - aggression at home  Results for orders placed or performed during the hospital encounter of 01/29/15 (from the past 72 hour(s))  Comprehensive metabolic panel     Status: Abnormal   Collection Time: 01/29/15  8:24 PM  Result Value Ref Range   Sodium 138 135 - 145 mmol/L   Potassium 3.7 3.5 - 5.1 mmol/L   Chloride 104 101 - 111 mmol/L   CO2 24 22 - 32 mmol/L   Glucose, Bld 110 (H) 65 - 99 mg/dL   BUN 16 6 - 20 mg/dL   Creatinine, Ser 1.08 0.61 - 1.24 mg/dL   Calcium 9.5 8.9 - 10.3 mg/dL   Total Protein 8.6 (H) 6.5 - 8.1  g/dL   Albumin 5.1 (H) 3.5 - 5.0 g/dL   AST 35 15 - 41 U/L   ALT 24 17 - 63 U/L   Alkaline Phosphatase 98 38 - 126 U/L   Total Bilirubin 0.6 0.3 - 1.2 mg/dL   GFR calc non Af Amer >60 >60 mL/min  GFR calc Af Amer >60 >60 mL/min    Comment: (NOTE) The eGFR has been calculated using the CKD EPI equation. This calculation has not been validated in all clinical situations. eGFR's persistently <60 mL/min signify possible Chronic Kidney Disease.    Anion gap 10 5 - 15  CBC     Status: None   Collection Time: 01/29/15  8:24 PM  Result Value Ref Range   WBC 6.7 4.0 - 10.5 K/uL   RBC 4.96 4.22 - 5.81 MIL/uL   Hemoglobin 14.8 13.0 - 17.0 g/dL   HCT 43.2 39.0 - 52.0 %   MCV 87.1 78.0 - 100.0 fL   MCH 29.8 26.0 - 34.0 pg   MCHC 34.3 30.0 - 36.0 g/dL   RDW 12.7 11.5 - 15.5 %   Platelets 252 150 - 400 K/uL  Ethanol (ETOH)     Status: None   Collection Time: 01/29/15  8:25 PM  Result Value Ref Range   Alcohol, Ethyl (B) <5 <5 mg/dL    Comment:        LOWEST DETECTABLE LIMIT FOR SERUM ALCOHOL IS 5 mg/dL FOR MEDICAL PURPOSES ONLY   Salicylate level     Status: None   Collection Time: 01/29/15  8:25 PM  Result Value Ref Range   Salicylate Lvl <9.8 2.8 - 30.0 mg/dL  Acetaminophen level     Status: Abnormal   Collection Time: 01/29/15  8:25 PM  Result Value Ref Range   Acetaminophen (Tylenol), Serum <10 (L) 10 - 30 ug/mL    Comment:        THERAPEUTIC CONCENTRATIONS VARY SIGNIFICANTLY. A RANGE OF 10-30 ug/mL MAY BE AN EFFECTIVE CONCENTRATION FOR MANY PATIENTS. HOWEVER, SOME ARE Esteve TREATED AT CONCENTRATIONS OUTSIDE THIS RANGE. ACETAMINOPHEN CONCENTRATIONS >150 ug/mL AT 4 HOURS AFTER INGESTION AND >50 ug/mL AT 12 HOURS AFTER INGESTION ARE OFTEN ASSOCIATED WITH TOXIC REACTIONS.   Urine rapid drug screen (hosp performed) (Not at [facility])     Status: Abnormal   Collection Time: 01/29/15  9:10 PM  Result Value Ref Range   Opiates NONE DETECTED NONE DETECTED   Cocaine NONE  DETECTED NONE DETECTED   Benzodiazepines NONE DETECTED NONE DETECTED   Amphetamines NONE DETECTED NONE DETECTED   Tetrahydrocannabinol POSITIVE (A) NONE DETECTED   Barbiturates NONE DETECTED NONE DETECTED    Comment:        DRUG SCREEN FOR MEDICAL PURPOSES ONLY.  IF CONFIRMATION IS NEEDED FOR ANY PURPOSE, NOTIFY LAB WITHIN 5 DAYS.        LOWEST DETECTABLE LIMITS FOR URINE DRUG SCREEN Drug Class       Cutoff (ng/mL) Amphetamine      1000 Barbiturate      200 Benzodiazepine   338 Tricyclics       250 Opiates          300 Cocaine          300 THC              50     Observation Level/Precautions:  15 minute checks  Laboratory:  Labs resulted, reviewed, and stable at this time.   Psychotherapy:  Group therapy, individual therapy, psychoeducation  Medications:  See MAR above  Consultations: None    Discharge Concerns: None    Estimated LOS: 5-7 days  Other:  N/A    Psychological Evaluations: No   Treatment Plan Summary: Daily contact with patient to assess and evaluate symptoms and progress in treatment and Medication management Patient will benefit from  inpatient treatment and stabilization.  Estimated length of stay is 5-7 days.  Reviewed past medical records,treatment plan.   Medications:  -Continue Haldol 5 mg po dailyfor psychosis, mood lability. -Continue Cogentin 33m po bid for EPS -Continue Vistaril 290mq6h prn anxiety -Continue nicotine gum -Start Trazodone 5085mhs prn insomnia -Start Zyprexa Zydis 5mg58m q8h prn severe agitation  -Will continue to monitor vitals ,medication compliance and treatment side effects while patient is here.  -Will monitor for medical issues as well as call consult as needed.  -Reviewed labs cbc, cmp, uds, bal - WNL; EKG reviewed: WNL NSR Qtc 394 -CSW will start working on disposition.  -Patient to participate in therapeutic milieu .   I certify that inpatient services furnished can reasonably be expected to improve the  patient's condition.    WithBenjamine MolaP-BC 02/01/2015 2:23PM Patient case  discussed with NP and patient seen Agree with NP Note and Assessment 19 y79r old man, who lives with his mother and brother in a hotel. He has been diagnosed with Schizophrenia in the past . He smokes cannabis daily. He was committed because of concerns that he was increasingly irritable and angry. He states he had stopped his psychiatric medications a few days ago, because " because they were not helping me ".( Haldol, Abilify)  He states he did have an altercation with brother, but states he was not aggressive or violent towards anybody else.  He states he has had hallucinations in the past, but not recently. He does describe some paranoid ideations, feeling that people are staring at him and making faces at him for no apparent reason, and reports he often thinks that conversations Between people are about him.  Denies any Depression or suicidal ideations.  Dx-  Schizophrenia By history, Cannabis Dependence Plan- Admit to inpatient psychiatric unit  Agrees to restart Haldol , will restart at 5 mgrs QHS . Also , will start Cogentin 1 mgr BID PRN for dystonia if needed  Obtain routine EKG to monitor QTc

## 2015-02-02 NOTE — [facility] Group Notes (Signed)
.  noat

## 2015-02-02 NOTE — Progress Notes (Signed)
[facility] MD Progress Note  02/02/2015  [name] Love  MRN:  [id number] Subjective:  Patient states "I'm doing great. Just resting some. I slept good last night. I'm doing great.   Objective:Pt is alert/oriented to self and situation, intermittently to time and place. Pt appears to be somewhat confused today and slower to respond than yesterday. However, when redirected and encouraged to respond, pt is more talkative and more linear and logical when engaged. Pt continues to present as mildly paranoid and suspicious although this is not as severe as yesterday. Pt denies suicidal/homicidal ideation as well and there is no objective evidence for either of those ideations at this time. Pt cites good sleep and appetite and is participating in the milieu.  Pt asked the nursing staff it the "no sex on the unit rule applies to men and women or men with men or what?", prompting them to request a "no roommate" order for the time being which is in line with policy on the unit.   Principal Problem: Schizophrenia ([facility]) Diagnosis:   Patient Active Problem List   Diagnosis Date Noted  . Schizophrenia ([facility]) [F20.9] 02/01/2015    Priority: High  . Aggressive behavior [F60.89]   . 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 Cannabis use disorder, severe, dependence ([facility]) [F12.20] 11/26/2014  . Tobacco use disorder [F17.200] 11/26/2014  . Schizophrenia, acute undifferentiated ([facility]) [F20.3] 11/24/2014   Total Time spent with patient: 15 minutes   Past Medical History:  Past Medical History  Diagnosis Date  . Schizophrenia, acute ([facility])    History reviewed. No pertinent past surgical history. Family History:  Family History  Problem Relation Age of Onset  . Mental illness Neg Hx    Social History:  History  Alcohol Use No     History  Drug Use  . Yes  . Special: Marijuana    Comment: per mother    Social History   Social History  . Marital Status: Single    Spouse Name: N/A  . Number of Children: N/A   . Years of Education: N/A   Social History Main Topics  . Smoking status: Current Every Day Smoker -- 0.25 packs/day    Types: Cigarettes  . Smokeless tobacco: None  . Alcohol Use: No  . Drug Use: Yes    Special: Marijuana     Comment: per mother  . Sexual Activity: Not Currently   Other Topics Concern  . None   Social History Narrative   Additional History:    Sleep: Good  Appetite:  Good  Musculoskeletal: Strength & Muscle Tone: within normal limits Gait & Station: normal Patient leans: N/A   Psychiatric Specialty Exam: Physical Exam  Review of Systems  Psychiatric/Behavioral: Positive for depression and hallucinations. Negative for suicidal ideas and substance abuse. The patient is nervous/anxious and has insomnia.   All other systems reviewed and are negative.   Blood pressure 124/58, pulse 120, temperature 98.3 F (36.8 C), temperature source Oral, resp. rate 16, height 6' (1.829 m), weight 92.987 kg (205 lb).Body mass index is 27.8 kg/(m^2).  General Appearance: Casual and Fairly Groomed  Eye Contact::  Minimal  Speech:  Clear and Coherent  Volume:  Normal  Mood:  Anxious and Irritable  Affect:  Non-Congruent  Thought Process:  Disorganized improving  Orientation:  Other:  Self and situation, intermittent to time and place  Thought Content:  Delusions, Hallucinations: Auditory, Paranoid Ideation and Rumination improving  Suicidal Thoughts:  No  Homicidal Thoughts:  No  Memory:  Immediate;   Fair Recent;   Fair Remote;   Fair  Judgement:  Impaired  Insight:  Shallow  Psychomotor Activity:  Restlessness  Concentration:  Poor  Recall:  [employer]  Fund of Knowledge:Fair  Language: Fair  Akathisia:  No  Handed:  Right  AIMS (if indicated):     Assets:  Communication Skills Desire for Improvement  ADL's:  Intact  Cognition: WNL  Sleep:  Number of Hours: 6.25     Current Medications: Current Facility-Administered Medications  Medication Dose Route  Frequency Provider Last Rate Last Dose  . acetaminophen (TYLENOL) tablet 650 mg  650 mg Oral Q6H PRN Worthy FlankIjeoma E Nwaeze, NP      . alum & mag hydroxide-simeth (MAALOX/MYLANTA) 200-200-20 MG/5ML suspension 30 mL  30 mL Oral Q4H PRN Worthy FlankIjeoma E Nwaeze, NP      . benztropine (COGENTIN) tablet 1 mg  1 mg Oral BID PRN Craige CottaFernando A Casaundra Takacs, MD      . haloperidol (HALDOL) tablet 5 mg  5 mg Oral QHS Craige CottaFernando A Kazi Reppond, MD   5 mg at 02/01/15 2225  . hydrOXYzine (ATARAX/VISTARIL) tablet 25 mg  25 mg Oral Q6H PRN Rockey SituFernando A Babyboy Loya, MD      . magnesium hydroxide (MILK OF MAGNESIA) suspension 30 mL  30 mL Oral Daily PRN Worthy FlankIjeoma E Nwaeze, NP      . nicotine polacrilex (NICORETTE) gum 2 mg  2 mg Oral PRN Jomarie LongsSaramma Eappen, MD   2 mg at 02/01/15 2048    Lab Results:  No results found for this or any previous visit (from the past 48 hour(s)).  Physical Findings: AIMS: Facial and Oral Movements Muscles of Facial Expression: None, normal Lips and Perioral Area: None, normal Jaw: None, normal Tongue: None, normal,Extremity Movements Upper (arms, wrists, hands, fingers): None, normal Lower (legs, knees, ankles, toes): None, normal, Trunk Movements Neck, shoulders, hips: None, normal, Overall Severity Severity of abnormal movements (highest score from questions above): None, normal Incapacitation due to abnormal movements: None, normal Patient's awareness of abnormal movements (rate only patient's report): No Awareness, Dental Status Current problems with teeth and/or dentures?: No Does patient usually wear dentures?: No  CIWA:  CIWA-Ar Total: 0 COWS:  COWS Total Score: 1  *On 02/02/15, I have reviewed and concur with treatment plan below, modified as follows: No changes today as pt is responding well to current regimen.   Treatment Plan Summary: Daily contact with patient to assess and evaluate symptoms and progress in treatment and Medication management Patient will benefit from inpatient treatment and stabilization.   Estimated length of stay is 5-7 days.  Reviewed past medical records,treatment plan.   Medications:  -Continue Haldol 5 mg po dailyfor psychosis, mood lability. -Continue Cogentin 1mg  po bid for EPS -Continue Vistaril 25mg  q6h prn anxiety -Continue nicotine gum -Start Trazodone 50mg  qhs prn insomnia -Start Zyprexa Zydis 5mg  po q8h prn severe agitation  -Will continue to monitor vitals ,medication compliance and treatment side effects while patient is here.  -Will monitor for medical issues as well as call consult as needed.  -Reviewed labs cbc, cmp, uds, bal - WNL; EKG reviewed: WNL NSR Qtc 394 on 11/19 -CSW will start working on disposition.  -Patient to participate in therapeutic milieu .    Beau FannyWithrow, John C, FNP-BC 02/02/2015, 11:45 AM Agree with NP Progress Note, as above  Nehemiah MassedFernando Doaa Kendzierski, MD

## 2015-02-02 NOTE — Progress Notes (Signed)
DAR NOTE: Patient presents with anxious affect and depressed mood. Pt has been isolative and stayed in the bed the whole day. Pt did not attend group and did not go for lunch. Denies pain, auditory and visual hallucinations.  Rates depression at 7, hopelessness at 5, and anxiety at 7.  Maintained on routine safety checks.  Medications given as prescribed.  Support and encouragement offered as needed.  States goal for today is to " get better and go home.".  Pt's safety ensured with 15 minute and environmental checks. Pt currently denies SI/HI and A/V hallucinations. Pt verbally agrees to seek staff if SI/HI or A/VH occurs and to consult with staff before acting on these thoughts. Will continue POC.
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
## 2015-02-02 NOTE — [facility] Group Notes (Signed)
[facility] Group Notes:  (Nursing/MHT/Case Management/Adjunct)  Date:  02/02/2015  Time:  1:40 PM  Type of Therapy:  Psychoeducational Skills  Participation Level:  Did Not Attend  Participation Quality:  Did Not Attend  Affect:  Did Not Attend  Cognitive:  Did Not Attend  Insight:  None  Engagement in Group:  Did Not Attend  Modes of Intervention:  Did Not Attend  Summary of Progress/Problems: Pt did not attend patient self inventory group.   Jacquelyne BalintForrest, Treasa Bradshaw Shanta 02/02/2015, 1:40 PM

## 2015-02-02 NOTE — [facility] Group Notes (Signed)
[facility] Group Notes:  (Nursing/MHT/Case Management/Adjunct)  Date:  02/02/2015  Time:  1:44 PM  Type of Therapy:  Psychoeducational Skills  Participation Level:  Did Not Attend  Participation Quality:  Did Not Attend  Affect:  Did Not Attend  Cognitive:  Did Not Attend  Insight:  None  Engagement in Group:  Did Not Attend  Modes of Intervention:  Did Not Attend  Summary of Progress/Problems: Pt did not attend healthy support systems group.   Jacquelyne BalintForrest, Leisha Trinkle Shanta 02/02/2015, 1:44 PM

## 2015-02-02 NOTE — [facility] Suicide Risk Assessment (Signed)
[facility] INPATIENT:  Family/Significant Other Suicide Prevention Education  Suicide Prevention Education:  Patient Refusal for Family/Significant Other Suicide Prevention Education: The patient [name] Love has refused to provide written consent for family/significant other to be provided Family/Significant Other Suicide Prevention Education during admission and/or prior to discharge.  Physician notified.  There is no mention of SI in patient's admission information for this admission.  Sarina SerGrossman-Orr, Shelsy Seng Jo 02/02/2015, 3:24 PM

## 2015-02-02 NOTE — Progress Notes (Signed)
[facility] Group Notes:  (Nursing/MHT/Case Management/Adjunct)  Date:  02/02/2015  Time:  9:35 PM  Type of Therapy:  Psychoeducational Skills  Participation Level:  Minimal  Participation Quality:  Attentive  Affect:  Not Congruent  Cognitive:  Lacking  Insight:  Lacking  Engagement in Group:  Limited  Modes of Intervention:  Education  Summary of Progress/Problems: The patient shared in group that he had a good day but would not divulge any additional details. He sat and smiled quite a bit and appeared to be uncomfortable. He did however verbalize that he expects his mother to come in and visit tomorrow. In terms of the theme for the day, his mother will be his support system.   Hazle CocaGOODMAN, Autumnrose Yore S 02/02/2015, 9:35 PM

## 2015-02-03 DIAGNOSIS — E221 Hyperprolactinemia: Secondary | ICD-10-CM

## 2015-02-03 DIAGNOSIS — F1220 Cannabis dependence, uncomplicated: Secondary | ICD-10-CM

## 2015-02-03 DIAGNOSIS — F17200 Nicotine dependence, unspecified, uncomplicated: Secondary | ICD-10-CM

## 2015-02-03 MED ORDER — CITALOPRAM HYDROBROMIDE 10 MG PO TABS
10.0000 mg | ORAL_TABLET | Freq: Every day | ORAL | Status: DC
  Administered 2015-02-03 – 2015-02-04 (×2): 10 mg via ORAL
  Filled 2015-02-03 (×4): qty 1

## 2015-02-03 MED ORDER — HALOPERIDOL 5 MG PO TABS
10.0000 mg | ORAL_TABLET | Freq: Every day | ORAL | Status: DC
  Administered 2015-02-03 – 2015-02-06 (×4): 10 mg via ORAL
  Filled 2015-02-03 (×5): qty 2
  Filled 2015-02-03: qty 14

## 2015-02-03 NOTE — Progress Notes (Signed)
Pt was in the dayroom earlier in the shift watching TV.  He requested Nicorette gum shortly before 2000, then returned to the dayroom.  Conversation with pt was minimal, but he denies SI/HI/AVH at this time.  He is unsure of his discharge plans.  He voiced no other needs or concerns.  He has been appropriate with staff.  Shortly after group tonight, he went to his room and lay down on his bed.  He dozed off, but Clinical research associatewriter was able to wake him for his hs meds.  Support and encouragement offered.  Safety maintained with q15 minute checks.
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
## 2015-02-03 NOTE — [facility] Group Notes (Signed)
[facility] Group Notes:  (Counselor/Nursing/MHT/Case Management/Adjunct)  02/03/2015 1:15PM  Type of Therapy:  Group Therapy  Participation Level:  Active  Participation Quality:  Appropriate  Affect:  Flat  Cognitive:  Oriented  Insight:  Improving  Engagement in Group:  Limited  Engagement in Therapy:  Limited  Modes of Intervention:  Discussion, Exploration and Socialization  Summary of Progress/Problems: The topic for group was balance in life.  Pt participated in the discussion about when their life was in balance and out of balance and how this feels.  Pt discussed ways to get back in balance and short term goals they can work on to get where they want to be. Came to group briefly.  Left and did not return.   [name] Love, [name] Love 02/03/2015 3:36 PM

## 2015-02-03 NOTE — Progress Notes (Signed)
D. Pt present with depressed mood and flat affect in the unit to day. Py continue to isolate himself in his room and does not interact well. Pt did go for lunch, came back and went back to his room. No complains reported. Pt's safety ensured with 15 minute and environmental checks. Pt currently denies SI/HI and A/V hallucinations. Pt verbally agrees to seek staff if SI/HI or A/VH occurs and to consult with staff before acting on these thoughts. Will continue POC.
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
## 2015-02-04 MED ORDER — CITALOPRAM HYDROBROMIDE 20 MG PO TABS
20.0000 mg | ORAL_TABLET | Freq: Every day | ORAL | Status: DC
  Administered 2015-02-05 – 2015-02-07 (×3): 20 mg via ORAL
  Filled 2015-02-04: qty 1
  Filled 2015-02-04: qty 7
  Filled 2015-02-04 (×3): qty 1

## 2015-02-04 NOTE — [facility] Group Notes (Signed)
[facility] LCSW Group Therapy  02/04/2015 1:15 pm  Type of Therapy: Process Group Therapy  Participation Level:  Active  Participation Quality:  Appropriate  Affect:  Flat  Cognitive:  Oriented  Insight:  Improving  Engagement in Group:  Limited  Engagement in Therapy:  Limited  Modes of Intervention:  Activity, Clarification, Education, Problem-solving and Support  Summary of Progress/Problems: Today's group addressed the issue of overcoming obstacles.  Patients were asked to identify their biggest obstacle post d/c that stands in the way of their on-going success, and then problem solve as to how to manage this. Came to group with encouragement.  Reluctant.  Stayed for 10 minutes.  Left and did not return.  Ida Rogueorth, Addalynn Kumari B 02/04/2015   4:34 PM

## 2015-02-04 NOTE — Progress Notes (Signed)
Adult Psychoeducational Group Note  Date:  02/04/2015 Time:  8:35 PM  Group Topic/Focus:  Wrap-Up Group:   The focus of this group is to help patients review their daily goal of treatment and discuss progress on daily workbooks.  Participation Level:  Did Not Attend  Pt did not attend wrap-up group.    Cleotilde NeerJasmine S Orion Mole 02/04/2015, 11:18 PM
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
## 2015-02-04 NOTE — Progress Notes (Signed)
D: Pt has depressed affect and mood.  He interacts with others cautiously.  He has been pacing the hallway at times tonight.  Pt denies SI/HI, denies hallucinations, denies pain.  When asked about his day, pt reports "I ate breakfast and dinner."  He reported he did not have a goal for the day.  He reported "I want to get back to going to [employer]."  He reports that after discharge from [facility], "I hope to get me a job."  Pt did not attend evening group.  He has been cooperative with staff.   A: Introduced self to pt.  Met with pt 1:1 and provided support and encouragement.  Actively listened to pt.  Medications administered per order.   R: Pt is compliant with medications.  Pt verbally contracts for safety.  Will continue to monitor and assess.
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
## 2015-02-04 NOTE — Progress Notes (Signed)
D) Pt affect has been flat, suspious. Pt is isolative to room seclusive to self. Pt has thought blocking and is slow to respond. Pt has not attended any unit activities despite prompting. Denies avh. A) Level 3 obs for safety, support and reassurance provided. Med ed reinforced. R) Guarded, paranoid.

## 2015-02-05 LAB — PROLACTIN: Prolactin: 23.2 ng/mL — ABNORMAL HIGH (ref 4.0–15.2)

## 2015-02-05 MED ORDER — HALOPERIDOL DECANOATE 100 MG/ML IM SOLN
100.0000 mg | INTRAMUSCULAR | Status: DC
  Administered 2015-02-07: 100 mg via INTRAMUSCULAR
  Filled 2015-02-05: qty 1
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
## 2015-02-05 NOTE — Progress Notes (Signed)
  [facility] :  Will you be returning to the same living situation after discharge:  Yes,  home At discharge, do you have transportation home?: Yes,  mother Do you have the ability to pay for your medications: Yes,  mental health  Release of information consent forms completed and in the chart;  Patient's signature needed at discharge.  Patient to Follow up at: Follow-up Information    [facility]   Specialty:  Behavioral Health   Why:  [facility]  If you do not have one, go to the walk-in clinic next week.  The earlier you get there in the morning, the sooner you will be seen.   Contact information:   [address] [place] [address] [phone]       Next level of care provider has access to [facility] Link: unknown  Patient denies SI/HI: Yes,  yes    Safety Planning and Suicide Prevention discussed: Yes,  yes  Have you used any form of tobacco in the last 30 days? (Cigarettes, Smokeless Tobacco, Cigars, and/or Pipes): Yes  Has patient been referred to the Quitline?: Patient refused referral  Ida Rogueorth, Shonette Rhames B 02/05/2015, 5:29 PM

## 2015-02-05 NOTE — Progress Notes (Signed)
D- Patient denies SI/HI and AVH.  Patient has been isolative to the his room this shift.  Patient stated that his depression was and 8, 10 being the worst as well as his feeling of hopelessness. Patient reported his anxiety at a 5 and did not set a goal for today. Patient was noted to be malodorous.   A- assess patient for safety, offer medications as prescribed, encouraged patient to attend groups and tend to hygiene.  R-  Patient was able to contract for safety.  Patient remained isolative to his room this shift.

## 2015-02-05 NOTE — Progress Notes (Signed)
Recreation Therapy Notes  11.23.2016 @ approximately 3:00pm. Per MD order LRT met with patient. Patient identified he is struggling to attend group sessions because he becomes overwhelmed around all the patients. Patient reported to LRT that when he is around strangers at home, for example at [employer] he looks around at everything to distract himself. Patient identified that the group room does not provide him anything to look at and he gets overwhelmed. Due to patient need for feeling of safety LRT had patient participate in guided visualization technique where he describes a place where he feels safe. Patient thought pattern during activity disorganized, as he described being in [place], in a blue [place], in someone's front yard, in a weight room and then in front of a black front door. Patient described going through the door and into a kitchen where he feels safe because there is a refrigerator with food in it. LRT encouraged patient to visualize himself in this place in order to help him tolerate group sessions. Patient mentioned he experiences some of the same feelings of being overwhelmed in the cafeteria, patient encouraged to use technique any time it is needed.   Patient presented with pressured speech during session. Patient malodorous and encouraged to complete ADL's, specifically taking a shower, putting on clean clothes and brushing his teeth daily. Patient tolerant of suggestions and agreed to do so.   Laureen Ochs Trina Asch, LRT/CTRS   Mireya Meditz L 02/05/2015 4:05 PM

## 2015-02-05 NOTE — [facility] Group Notes (Signed)
[facility]  02/05/2015 , 1:39 PM    Type of Therapy:  [employer] Presentation  Participation Level:  Active  Participation Quality:  Attentive  Affect:  Blunted  Cognitive:  Oriented  Insight:  Limited  Engagement in Therapy:  Engaged  Modes of Intervention:  Discussion, Education and Socialization  Summary of Progress/Problems:  Onalee HuaDavid from [employer] came to present his recovery story and play the guitar.  Stayed for the first half.  Sat quietly.  Engaged.  Did not return.  Daryel Geraldorth, Patrese Neal B 02/05/2015 , 1:39 PM

## 2015-02-05 NOTE — Plan of Care (Signed)
Problem: Alteration in mood Goal: LTG-Patient reports reduction in suicidal thoughts (Patient reports reduction in suicidal thoughts and is able to verbalize a safety plan for whenever patient is feeling suicidal)  Outcome: Progressing Pt denies SI tonight.  He verbally contracts for safety.       

## 2015-02-06 DIAGNOSIS — F209 Schizophrenia, unspecified: Principal | ICD-10-CM

## 2015-02-06 NOTE — BHH Group Notes (Signed)
The focus of this group is to educate the patient on the purpose and policies of crisis stabilization and provide a format to answer questions about their admission.  The group details unit policies and expectations of patients while admitted.  Patient attended 0900 nurse education orientation group this morning.  Patient actively participated and had appropriate affect.  Patient was alert.  Patient was alert.  Patient had appropriate insight and appropriate engagement.  Today patient will work on 3 goals for discharge.

## 2015-02-06 NOTE — Plan of Care (Signed)
Problem: Alteration in mood Goal: LTG-Patient reports reduction in suicidal thoughts (Patient reports reduction in suicidal thoughts and is able to verbalize a safety plan for whenever patient is feeling suicidal)  Outcome: Progressing Pt denies SI at this time     

## 2015-02-06 NOTE — Plan of Care (Signed)
Problem: Consults Goal: Depression Patient Education See Patient Education Module for education specifics.  Outcome: Progressing Nurse discussed depression/coping skills with patient.        

## 2015-02-06 NOTE — Progress Notes (Signed)
D: Pt denies SI/HI/AVH. Pt is pleasant and cooperative. Pt stated he was doing better because the meds are working. Pt stays to himself on the unit, pt was seen on unit, but isolating.    A: Pt was offered support and encouragement. Pt was given scheduled medications. Pt was encourage to attend groups. Q 15 minute checks were done for safety.   R:Pt attends groups and interacts well with peers and staff. Pt is taking medication. Pt has no complaints at this time .Pt receptive to treatment and safety maintained on unit.
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
## 2015-02-06 NOTE — Progress Notes (Signed)
D:  Patient's self inventory sheet, patient slept good last night, no sleep medication given.  Good appetite, low energy level, good concentration.  Rated depression and hopeless 8, anxiety 7. Withdrawals.  Denied SI.  Denied physical pain.  Goal is to communicate.  Plans to attend groups.  Does have discharge plans. A:  Medications administered per MD orders.  Emotional support and encouragement given patient. R:  Denied SI and HI, contracts for safety.  Denied A/V hallucinations.  Safety maintained with 15 minute checks.

## 2015-02-06 NOTE — Progress Notes (Signed)
Pt cooperative and pleasant. Denies any AVH at this time.   Med compliant but remains isolative.  Only came out of hs room for snack.  Will continue to monitor for safety with every 15 minute rounds.

## 2015-02-07 MED ORDER — BENZTROPINE MESYLATE 1 MG PO TABS: 1.0000 mg | ORAL_TABLET | Freq: Two times a day (BID) | ORAL | Status: DC | PRN

## 2015-02-07 MED ORDER — NICOTINE POLACRILEX 2 MG MT GUM: 2.0000 mg | CHEWING_GUM | OROMUCOSAL | Status: DC | PRN

## 2015-02-07 MED ORDER — HYDROXYZINE HCL 25 MG PO TABS: 25.0000 mg | ORAL_TABLET | Freq: Four times a day (QID) | ORAL | Status: DC | PRN

## 2015-02-07 MED ORDER — HALOPERIDOL DECANOATE 100 MG/ML IM SOLN: 100.0000 mg | INTRAMUSCULAR | Status: DC

## 2015-02-07 MED ORDER — CITALOPRAM HYDROBROMIDE 20 MG PO TABS: 20.0000 mg | ORAL_TABLET | Freq: Every day | ORAL | Status: DC

## 2015-02-07 MED ORDER — HALOPERIDOL 10 MG PO TABS: 10.0000 mg | ORAL_TABLET | Freq: Every day | ORAL | Status: DC

## 2015-02-07 NOTE — Plan of Care (Signed)
Problem: Alteration in mood Goal: LTG-Patient reports reduction in suicidal thoughts (Patient reports reduction in suicidal thoughts and is able to verbalize a safety plan for whenever patient is feeling suicidal)  Outcome: Progressing Pt denies any SI at this time.

## 2015-02-07 NOTE — [facility] Suicide Risk Assessment (Signed)
[facility] Discharge Suicide Risk Assessment   Demographic Factors:  Male, Low socioeconomic status and Unemployed  Total Time spent with patient: 30 minutes  Musculoskeletal: Strength & Muscle Tone: within normal limits Gait & Station: normal Patient leans: no lean  Psychiatric Specialty Exam: Physical Exam  Constitutional: He appears well-developed.  HENT:  Head: Normocephalic and atraumatic.  Skin: He is not diaphoretic.    Review of Systems  Constitutional: Negative for fever.  Cardiovascular: Negative for chest pain.  Skin: Negative for rash.  Neurological: Negative for tremors.  Psychiatric/Behavioral: Negative for depression and suicidal ideas.    Blood pressure 116/73, pulse 100, temperature 98.4 F (36.9 C), temperature source Oral, resp. rate 20, height 6' (1.829 m), weight 92.987 kg (205 lb).Body mass index is 27.8 kg/(m^2).  General Appearance: Casual  Eye Contact::  Fair  Speech:  Slow409  Volume:  Decreased  Mood:  Euthymic  Affect:  Constricted  Thought Process:  Coherent  Orientation:  Full (Time, Place, and Person)  Thought Content:  Rumination  Suicidal Thoughts:  No  Homicidal Thoughts:  No  Memory:  Immediate;   Fair Recent;   Fair  Judgement:  Fair  Insight:  Shallow  Psychomotor Activity:  Normal  Concentration:  Fair  Recall:  [employer] of Knowledge:Poor  Language: fair  Akathisia:  Negative  Handed:  Right  AIMS (if indicated):     Assets:  Desire for Improvement  Sleep:  Number of Hours: 6.25  Cognition: WNL  ADL's:  Intact   Have you used any form of tobacco in the last 30 days? (Cigarettes, Smokeless Tobacco, Cigars, and/or Pipes): Yes  Has this patient used any form of tobacco in the last 30 days? (Cigarettes, Smokeless Tobacco, Cigars, and/or Pipes) Yes, A prescription for an FDA-approved tobacco cessation medication was offered at discharge and the patient refused  Mental Status Per Nursing Assessment::   On Admission:  Suicidal  ideation indicated by others  Current Mental Status by Physician: see MSE above  Loss Factors: Financial problems/change in socioeconomic status  Historical Factors: Impulsivity  Risk Reduction Factors:   Positive coping skills or problem solving skills  Continued Clinical Symptoms:  Alcohol/Substance Abuse/Dependencies Unstable or Poor Therapeutic Relationship Previous Psychiatric Diagnoses and Treatments  Cognitive Features That Contribute To Risk:  Closed-mindedness    Suicide Risk:  Minimal: No identifiable suicidal ideation.  Patients presenting with no risk factors but with morbid ruminations; may be classified as minimal risk based on the severity of the depressive symptoms  Principal Problem: Schizophrenia [facility]) Discharge Diagnoses:  Patient Active Problem List   Diagnosis Date Noted  . Schizophrenia (HCC) [F20.9] 02/01/2015  . Aggressive behavior [F60.89]   . 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 Cannabis use disorder, severe, dependence (HCC) [F12.20] 11/26/2014  . Tobacco use disorder [F17.200] 11/26/2014  . 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   Specialty:  Behavioral Health   Why:  Go to your next scheduled appointment at [facility].  If you do not have one, go to the walk-in clinic next week.  The earlier you get there in the morning, the sooner you will be seen.   Contact information:   [address] [place] [address] [phone]       Plan Of Care/Follow-up recommendations:  Activity:  as tolerated Diet:  regular See Discharge summary for details.  Follow up with appointments, medication appointments and recommendations including compliance. Referral sources for  substance abuse.  Is patient on multiple antipsychotic therapies at discharge:  No   Has Patient had three or more failed trials of antipsychotic monotherapy by history:  No  Recommended Plan for Multiple  Antipsychotic Therapies: NA    Yariana Hoaglund 02/07/2015, 9:58 AM

## 2015-02-07 NOTE — Progress Notes (Addendum)
  [facility] Adult Case Management Discharge Plan :  Will you be returning to the same living situation after discharge:  Yes,  home [facility] yes, pt's mother to pick him up after lunch.  Do you have the ability to pay for your medications: Yes,  mental health  Release of information consent forms completed and submitted to medical records by CSW.  Patient to Follow up at: Follow-up Information    [facility]   Specialty:  Behavioral Health   Why:  [facility]  If you do not have one, go to the walk-in clinic next week.  The earlier you get there in the morning, the sooner you will be seen.   Contact information:   [address] [place] [address] [phone]       Next level of care provider [facility] Link:no  Patient denies SI/HI: Yes,  during group/self report.     Safety Planning and Suicide Prevention discussed: Yes,  SPE completed with pt, as he refused to cosnent to family contact.   Have you used any form of tobacco in the last 30 days? (Cigarettes, Smokeless Tobacco, Cigars, and/or Pipes): Yes  Has patient been referred to the Quitline?: Yes, faxed on 11/20  Smart, Harlie Buening LCSW 02/07/2015, 10:21 AM

## 2015-02-07 NOTE — [facility] Group Notes (Signed)
[facility] LCSW Aftercare Discharge Planning Group Note   02/07/2015 9:47 AM  Participation Quality:  Patient invited but chose to remain in room    [name] Love, [name] Love   

## 2015-02-07 NOTE — Progress Notes (Signed)
Patient discharged home with prescriptions and samples. Patient was stable and appreciative at that time. All papers and prescriptions were given and valuables returned. Verbal understanding expressed. Denies SI/HI and A/VH. Patient given opportunity to express concerns and ask questions.  

## 2015-02-07 NOTE — Discharge Summary (Signed)
Physician Discharge Summary Note  [name] is an 19 y.o., male MRN:  [id number] DOB:  Feb 28, 1996 Patient phone:  [phone] (home)  Patient address:   [address] [place] [id number],   Total Time spent with patient: Greater than 30 minutes  Date of Admission:  01/31/2015  Date of Discharge: 02-07-2015  Reason for Admission: Worsening symptoms of Schizophrenia  Principal Problem: Schizophrenia [name] Love(HCC)  Discharge Diagnoses: Patient Active Problem List   Diagnosis Date Noted  . Schizophrenia (HCC) [F20.9] 02/01/2015  . Aggressive behavior [F60.89]   . 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 Cannabis use disorder, severe, dependence (HCC) [F12.20] 11/26/2014  . Tobacco use disorder [F17.200] 11/26/2014  . 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 Constitutional: He is oriented to person, place, and time.  Genitourinary:  Denies any issues in this area   Neurological: He is oriented to person, place, and time.  Psychiatric: Thought content is not paranoid. He expresses no homicidal and no suicidal ideation.    Review of Systems  Constitutional: Negative.   HENT: Negative.   Eyes: Negative.   Respiratory: Negative.   Cardiovascular: Negative.   Gastrointestinal: Negative.   Genitourinary: Negative.   Musculoskeletal: Negative.   Skin: Negative.   Neurological: Negative.   Endo/Heme/Allergies: Negative.   Psychiatric/Behavioral: Positive for hallucinations (Hx of) and substance abuse (Hx tobacco abuse ). Negative for depression, suicidal ideas and memory loss. The patient has insomnia (Stabilized with medication prior to discharge). The patient is not nervous/anxious.     Blood pressure 116/73, pulse 100, temperature 98.4 F (36.9 C), temperature  source Oral, resp. rate 20, height 6' (1.829 m), weight 92.987 kg (205 lb).Body mass index is 27.8 kg/(m^2).  Have you used any form of tobacco in the last 30 days? (Cigarettes, Smokeless Tobacco, Cigars, and/or Pipes): Yes  Has this patient used any form of tobacco in the last 30 days? (Cigarettes, Smokeless Tobacco, Cigars, and/or Pipes) Yes, A prescription for an FDA-approved tobacco cessation medication was offered at discharge and the patient refused  Past Medical History:  Past Medical History  Diagnosis Date  . Schizophrenia, acute (HCC)    History reviewed. No pertinent past surgical history. Family History:  Family History  Problem Relation Age of Onset  . Mental illness Neg Hx    Social History:  History  Alcohol Use No     History  Drug Use  . Yes  . Special: Marijuana    Comment: per mother    Social History   Social History  . Marital Status: Single    Spouse Name: N/A  . Number of Children: N/A  . Years of Education: N/A   Social History Main Topics  . Smoking status: Current Every Day Smoker -- 0.25 packs/day    Types: Cigarettes  . Smokeless tobacco: None  . Alcohol Use: No  . Drug Use: Yes    Special: Marijuana     Comment: per mother  . Sexual Activity: Not Currently   Other Topics Concern  . None   Social History Narrative   Risk to Self: Is patient at risk for suicide?: No Risk to Others: No Prior Inpatient Therapy: Yes Prior Outpatient Therapy: Yes  Level of Care:  OP  Love Course: [name] Love male, 19 years old was evaluated this morning. Patient was  calm but was not able to participate fully with the interview. Patient stated that he came to the ER because " I was fighting with my brother. Patient answered most of the questions with "no, I don't know" He stated that he has a mental illness and has stopped taking medications for 2 months.   [name] Love was admitted to the adult unit for worsening symptoms of Schizophrenia disorder related to  being off of his medicines x 2 months. During his admission assessment, he was evaluated and his symptoms were identified. Medication management was discussed and initiated targeting his presenting symptoms. He was oriented to the unit and encouraged to participate in the unit programming. He presented no other pre-existing medical problems that required treatment were identified.         During his Love stay, [name] Love was evaluated each day by a clinical provider to ascertain his response to his treatment regimen. Medication changes & adjustments were made according to need. As the day goes by, improvement was noted as evidenced by his report of decreasing symptoms, improved sleep, appetite, affect, medication tolerance, behavior, and participation in the unit programming.  He was required on daily basis to complete a self inventory asssessment noting mood, mental status, pain, new symptoms, anxiety and concerns. His symptoms responded well to his treatment regimen, being in a therapeutic and supportive environment also assisted in his mood stability. [name] Love did present appropriate behavior & was motivated for recovery. He worked closely with the treatment team and case manager to develop a discharge plan with appropriate goals to maintain mood stability after discharge. Coping skills, problem solving as well as relaxation therapies were also part of his unit programming.  On this day of his Love discharge, [name] Love was in much improved condition than upon admission. His symptoms were reported as significantly decreased or resolved completely. Upon discharge, he denies SI/HI and voiced no AVH. He was motivated to continue taking medication with a goal of continued improvement in mental health. He was medicated & discharge on; Citalopram 20 mg for depression, Haldol 10 mg for mood control, Haldol injectable 100 mg/ml Q 30 days for mood control, Hydroxyzine 25 mg for anxiety. He is discharged to follow-up  care at the [facility] for routine psychiatric care & medication management. He is provided with all the necessary information needed to make this appointment without problems. Macai was provided with a a 7 days worth, supply samples of his [facility] discharge medications. He left [facility] in no apparent distress. Transportation per patient family.  Consults:  psychiatry  Significant Diagnostic Studies:  labs: CBC with diff, CMP, UDS, toxicology tests, U/A, results reviewed, stable  Discharge Vitals:   Blood pressure 116/73, pulse 100, temperature 98.4 F (36.9 C), temperature source Oral, resp. rate 20, height 6' (1.829 m), weight 92.987 kg (205 lb). Body mass index is 27.8 kg/(m^2). Lab Results:   No results found for this or any previous visit (from the past 72 hour(s)).  Physical Findings: AIMS: Facial and Oral Movements Muscles of Facial Expression: None, normal Lips and Perioral Area: None, normal Jaw: None, normal Tongue: None, normal,Extremity Movements Upper (arms, wrists, hands, fingers): None, normal Lower (legs, knees, ankles, toes): None, normal, Trunk Movements Neck, shoulders, hips: None, normal, Overall Severity Severity of abnormal movements (highest score from questions above): None, normal Incapacitation due to abnormal movements: None, normal Patient's awareness of abnormal movements (rate only patient's report): No Awareness, Dental Status Current problems with teeth and/or dentures?: No Does patient usually wear  dentures?: No  CIWA:  CIWA-Ar Total: 1 COWS:  COWS Total Score: 2   See Psychiatric Specialty Exam and Suicide Risk Assessment completed by Attending Physician prior to discharge.  Discharge destination:  Home  Is patient on multiple antipsychotic therapies at discharge: No   Has Patient had three or more failed trials of antipsychotic monotherapy by history:  No  Recommended Plan for Multiple Antipsychotic Therapies: NA     Medication List    STOP  taking these medications        ARIPiprazole 5 MG tablet  Commonly known as:  ABILIFY      TAKE these medications      Indication   benztropine 1 MG tablet  Commonly known as:  COGENTIN  Take 1 tablet (1 mg total) by mouth 2 (two) times daily as needed for tremors (EPS, dystonic reaction).   Indication:  Extrapyramidal Reaction caused by Medications     citalopram 20 MG tablet  Commonly known as:  CELEXA  Take 1 tablet (20 mg total) by mouth daily. For depression   Indication:  Depression     haloperidol 10 MG tablet  Commonly known as:  HALDOL  Take 1 tablet (10 mg total) by mouth at bedtime. For mood control   Indication:  Schizophrenia     haloperidol decanoate 100 MG/ML injection  Commonly known as:  HALDOL DECANOATE  Inject 1 mL (100 mg total) into the muscle every 30 (thirty) days. (Next dose due 03-07-15): For mood control   Indication:  Schizophrenia     hydrOXYzine 25 MG tablet  Commonly known as:  ATARAX/VISTARIL  Take 1 tablet (25 mg total) by mouth every 6 (six) hours as needed for anxiety.   Indication:  Anxiety     nicotine polacrilex 2 MG gum  Commonly known as:  NICORETTE  Take 1 each (2 mg total) by mouth as needed for smoking cessation.   Indication:  Nicotine Addiction       Follow-up Information    Go to [facility].   Specialty:  Behavioral Health   Why:  Go to your next scheduled appointment at [facility].  If you do not have one, go to the walk-in clinic next week.  The earlier you get there in the morning, the sooner you will be seen.   [address] [place] [address] [phone]      Follow-up recommendations: Activity:  As tolerated Diet: As recommended by your primary care doctor. Keep all scheduled follow-up appointments as recommended.   Comments: Take all your medications as prescribed by your mental healthcare provider. Report any adverse effects and or reactions from your medicines to your outpatient provider  promptly. Patient is instructed and cautioned to not engage in alcohol and or illegal drug use while on prescription medicines. In the event of worsening symptoms, patient is instructed to call the crisis hotline, 911 and or go to the nearest ED for appropriate evaluation and treatment of symptoms. Follow-up with your primary care provider for your other medical issues, concerns and or health care needs.    Total Discharge Time: Greater than 30 minutes  Signed: Armandina Stammer I PMHNP, FNP-BC 02/07/2015, 10:19 AM  I have examined the patient and agree with the discharge plan and findings. I have also done suicide assessment on this patient.

## 2015-07-08 ENCOUNTER — Emergency Department (HOSPITAL_COMMUNITY)
Admission: EM | Admit: 2015-07-08 | Discharge: 2015-07-09 | Disposition: A | Discharge status: Other Acute Inpatient Hospital | Payer: Medicaid Other | Attending: Emergency Medicine | Admitting: Emergency Medicine

## 2015-07-08 ENCOUNTER — Encounter (HOSPITAL_COMMUNITY): Payer: Self-pay

## 2015-07-08 DIAGNOSIS — F17210 Nicotine dependence, cigarettes, uncomplicated: Secondary | ICD-10-CM | POA: Insufficient documentation

## 2015-07-08 DIAGNOSIS — R4689 Other symptoms and signs involving appearance and behavior: Secondary | ICD-10-CM

## 2015-07-08 DIAGNOSIS — F919 Conduct disorder, unspecified: Secondary | ICD-10-CM | POA: Insufficient documentation

## 2015-07-08 DIAGNOSIS — Z79899 Other long term (current) drug therapy: Secondary | ICD-10-CM | POA: Insufficient documentation

## 2015-07-08 DIAGNOSIS — F203 Undifferentiated schizophrenia: Secondary | ICD-10-CM | POA: Diagnosis present

## 2015-07-08 LAB — RAPID URINE DRUG SCREEN, HOSP PERFORMED
AMPHETAMINES: NOT DETECTED
BARBITURATES: NOT DETECTED
BENZODIAZEPINES: NOT DETECTED
Cocaine: NOT DETECTED
Opiates: NOT DETECTED
TETRAHYDROCANNABINOL: NOT DETECTED

## 2015-07-08 LAB — COMPREHENSIVE METABOLIC PANEL
ALT: 17 U/L (ref 17–63)
AST: 26 U/L (ref 15–41)
Albumin: 4.7 g/dL (ref 3.5–5.0)
Alkaline Phosphatase: 83 U/L (ref 38–126)
Anion gap: 6 (ref 5–15)
BUN: 10 mg/dL (ref 6–20)
CHLORIDE: 104 mmol/L (ref 101–111)
CO2: 28 mmol/L (ref 22–32)
CREATININE: 1.07 mg/dL (ref 0.61–1.24)
Calcium: 9.3 mg/dL (ref 8.9–10.3)
GLUCOSE: 90 mg/dL (ref 65–99)
POTASSIUM: 3.5 mmol/L (ref 3.5–5.1)
Sodium: 138 mmol/L (ref 135–145)
TOTAL PROTEIN: 7.8 g/dL (ref 6.5–8.1)
Total Bilirubin: 0.2 mg/dL — ABNORMAL LOW (ref 0.3–1.2)

## 2015-07-08 LAB — CBC
HEMATOCRIT: 41.3 % (ref 39.0–52.0)
Hemoglobin: 13.9 g/dL (ref 13.0–17.0)
MCH: 29.3 pg (ref 26.0–34.0)
MCHC: 33.7 g/dL (ref 30.0–36.0)
MCV: 87.1 fL (ref 78.0–100.0)
Platelets: 250 10*3/uL (ref 150–400)
RBC: 4.74 MIL/uL (ref 4.22–5.81)
RDW: 13 % (ref 11.5–15.5)
WBC: 6.3 10*3/uL (ref 4.0–10.5)

## 2015-07-08 LAB — ACETAMINOPHEN LEVEL: Acetaminophen (Tylenol), Serum: <10 ug/mL — ABNORMAL LOW (ref 10–30)

## 2015-07-08 LAB — SALICYLATE LEVEL: Salicylate Lvl: <4 mg/dL (ref 2.8–30.0)

## 2015-07-08 LAB — ETHANOL: Alcohol, Ethyl (B): <5 mg/dL (ref <5)

## 2015-07-08 MED ORDER — ALUM & MAG HYDROXIDE-SIMETH 200-200-20 MG/5ML PO SUSP: 30.0000 mL | ORAL | Status: DC | PRN

## 2015-07-08 MED ORDER — IBUPROFEN 200 MG PO TABS: 600.0000 mg | ORAL_TABLET | Freq: Three times a day (TID) | ORAL | Status: DC | PRN

## 2015-07-08 MED ORDER — LORAZEPAM 1 MG PO TABS: 1.0000 mg | ORAL_TABLET | Freq: Three times a day (TID) | ORAL | Status: DC | PRN

## 2015-07-08 MED ORDER — BENZTROPINE MESYLATE 1 MG PO TABS: 1.0000 mg | ORAL_TABLET | Freq: Two times a day (BID) | ORAL | Status: DC | PRN

## 2015-07-08 MED ORDER — CITALOPRAM HYDROBROMIDE 20 MG PO TABS
20.0000 mg | ORAL_TABLET | Freq: Every day | ORAL | Status: DC
  Administered 2015-07-08 – 2015-07-09 (×2): 20 mg via ORAL
  Filled 2015-07-08 (×3): qty 1

## 2015-07-08 MED ORDER — ACETAMINOPHEN 325 MG PO TABS: 650.0000 mg | ORAL_TABLET | ORAL | Status: DC | PRN

## 2015-07-08 MED ORDER — HALOPERIDOL 5 MG PO TABS
10.0000 mg | ORAL_TABLET | Freq: Every day | ORAL | Status: DC
  Administered 2015-07-08: 10 mg via ORAL
  Filled 2015-07-08: qty 2

## 2015-07-08 MED ORDER — ONDANSETRON HCL 4 MG PO TABS: 4.0000 mg | ORAL_TABLET | Freq: Three times a day (TID) | ORAL | Status: DC | PRN

## 2015-07-08 MED ORDER — HYDROXYZINE HCL 25 MG PO TABS: 25.0000 mg | ORAL_TABLET | Freq: Four times a day (QID) | ORAL | Status: DC | PRN

## 2015-07-08 NOTE — ED Notes (Signed)
Pt oriented to room and unit.  Pt is calm and cooperative.  He denies S/I, H/I, and AVH.  Pt denies pain or discomfort.  15 minute checks and video monitoring in place.
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
## 2015-07-08 NOTE — ED Notes (Addendum)
Pt has in belonging bag:  Black sweat pants, black jacket, black shoes, black hat, and brown wallet ([facility] ID card)

## 2015-07-08 NOTE — ED Notes (Signed)
Report received from Regina, RN. Patient is observed resting quietly in bed, respirations even and unlabored, color satisfactory. Patient appears in no distress.  Will continue to monitor for safety.  Q15 minute checks continue. 

## 2015-07-08 NOTE — Progress Notes (Addendum)
Per Donell SievertSpencer Simon PA-C, patient meets psychiatric inpatient treatment criteria and will be referred to other facilities [facility].  Patient has been [facility] to: [facility]. [facility] - per Christin, accepting referrals for the waitlist. [facility] - per [place], adult beds open. [facility] - per Florentina AddisonKatie, adult beds, no adolescent beds and no waitlist. [facility] - per Laurene, accepting referrals. [facility] - per Star, send it.  [facility]  [place], [place] Disposition staff 07/08/2015 7:40 PM

## 2015-07-08 NOTE — ED Notes (Signed)
Pt waiting on inpatient placement
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
## 2015-07-09 ENCOUNTER — Inpatient Hospital Stay
Admission: EM | Admit: 2015-07-09 | Discharge: 2015-07-25 | DRG: 885 | Disposition: A | Discharge status: Home / Self Care | Payer: Medicaid Other | Source: Intra-hospital | Attending: Psychiatry | Admitting: Psychiatry

## 2015-07-09 DIAGNOSIS — F17210 Nicotine dependence, cigarettes, uncomplicated: Secondary | ICD-10-CM | POA: Diagnosis present

## 2015-07-09 DIAGNOSIS — F203 Undifferentiated schizophrenia: Secondary | ICD-10-CM | POA: Diagnosis present

## 2015-07-09 DIAGNOSIS — Z62810 Personal history of physical and sexual abuse in childhood: Secondary | ICD-10-CM | POA: Diagnosis present

## 2015-07-09 DIAGNOSIS — E221 Hyperprolactinemia: Secondary | ICD-10-CM | POA: Diagnosis present

## 2015-07-09 DIAGNOSIS — F6089 Other specific personality disorders: Secondary | ICD-10-CM | POA: Diagnosis not present

## 2015-07-09 DIAGNOSIS — F1220 Cannabis dependence, uncomplicated: Secondary | ICD-10-CM | POA: Diagnosis present

## 2015-07-09 DIAGNOSIS — Z9114 Patient's other noncompliance with medication regimen: Secondary | ICD-10-CM | POA: Diagnosis not present

## 2015-07-09 DIAGNOSIS — F209 Schizophrenia, unspecified: Secondary | ICD-10-CM | POA: Diagnosis present

## 2015-07-09 DIAGNOSIS — F23 Brief psychotic disorder: Secondary | ICD-10-CM | POA: Diagnosis present

## 2015-07-09 DIAGNOSIS — R45851 Suicidal ideations: Secondary | ICD-10-CM | POA: Diagnosis present

## 2015-07-09 DIAGNOSIS — G4700 Insomnia, unspecified: Secondary | ICD-10-CM | POA: Diagnosis present

## 2015-07-09 DIAGNOSIS — F17200 Nicotine dependence, unspecified, uncomplicated: Secondary | ICD-10-CM | POA: Diagnosis present

## 2015-07-09 MED ORDER — CITALOPRAM HYDROBROMIDE 20 MG PO TABS
20.0000 mg | ORAL_TABLET | Freq: Every day | ORAL | Status: DC
  Administered 2015-07-10: 20 mg via ORAL
  Filled 2015-07-09: qty 1

## 2015-07-09 MED ORDER — IBUPROFEN 600 MG PO TABS: 600.0000 mg | ORAL_TABLET | Freq: Three times a day (TID) | ORAL | Status: DC | PRN

## 2015-07-09 MED ORDER — NICOTINE 21 MG/24HR TD PT24
21.0000 mg | MEDICATED_PATCH | Freq: Every day | TRANSDERMAL | Status: DC | PRN
  Administered 2015-07-10 – 2015-07-23 (×4): 21 mg via TRANSDERMAL
  Filled 2015-07-09 (×4): qty 1

## 2015-07-09 MED ORDER — ALUM & MAG HYDROXIDE-SIMETH 200-200-20 MG/5ML PO SUSP: 30.0000 mL | ORAL | Status: DC | PRN

## 2015-07-09 MED ORDER — BENZTROPINE MESYLATE 1 MG PO TABS
1.0000 mg | ORAL_TABLET | Freq: Two times a day (BID) | ORAL | Status: DC
  Administered 2015-07-09: 1 mg via ORAL
  Filled 2015-07-09: qty 1

## 2015-07-09 MED ORDER — TRAZODONE HCL 50 MG PO TABS: 50.0000 mg | ORAL_TABLET | Freq: Every evening | ORAL | Status: DC | PRN

## 2015-07-09 MED ORDER — ACETAMINOPHEN 325 MG PO TABS
650.0000 mg | ORAL_TABLET | Freq: Four times a day (QID) | ORAL | Status: DC | PRN
  Administered 2015-07-10 – 2015-07-15 (×2): 650 mg via ORAL
  Filled 2015-07-09 (×2): qty 2

## 2015-07-09 MED ORDER — ONDANSETRON HCL 4 MG PO TABS: 4.0000 mg | ORAL_TABLET | Freq: Three times a day (TID) | ORAL | Status: DC | PRN

## 2015-07-09 MED ORDER — TRAZODONE HCL 50 MG PO TABS
50.0000 mg | ORAL_TABLET | Freq: Every evening | ORAL | Status: DC | PRN
  Administered 2015-07-09 – 2015-07-10 (×2): 50 mg via ORAL
  Filled 2015-07-09 (×2): qty 1

## 2015-07-09 MED ORDER — HALOPERIDOL 5 MG PO TABS
10.0000 mg | ORAL_TABLET | Freq: Every day | ORAL | Status: DC
  Administered 2015-07-09: 10 mg via ORAL
  Filled 2015-07-09: qty 2

## 2015-07-09 MED ORDER — HYDROXYZINE HCL 25 MG PO TABS
25.0000 mg | ORAL_TABLET | Freq: Four times a day (QID) | ORAL | Status: DC | PRN
  Administered 2015-07-09 – 2015-07-12 (×3): 25 mg via ORAL
  Filled 2015-07-09 (×3): qty 1

## 2015-07-09 MED ORDER — LORAZEPAM 1 MG PO TABS
1.0000 mg | ORAL_TABLET | Freq: Three times a day (TID) | ORAL | Status: DC | PRN
  Administered 2015-07-10 – 2015-07-23 (×3): 1 mg via ORAL
  Filled 2015-07-09 (×3): qty 1

## 2015-07-09 MED ORDER — MAGNESIUM HYDROXIDE 400 MG/5ML PO SUSP: 30.0000 mL | Freq: Every day | ORAL | Status: DC | PRN

## 2015-07-09 MED ORDER — BENZTROPINE MESYLATE 1 MG PO TABS
1.0000 mg | ORAL_TABLET | Freq: Two times a day (BID) | ORAL | Status: DC
  Administered 2015-07-09 – 2015-07-10 (×2): 1 mg via ORAL
  Filled 2015-07-09 (×2): qty 1
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
## 2015-07-09 NOTE — ED Notes (Addendum)
Pt's affect his blunted, mood depressed, poor eye contact. He gives short answers to questions. He is calm and stays in his room most of the time. He took his medication.

## 2015-07-09 NOTE — ED Notes (Signed)
[facility]

## 2015-07-09 NOTE — BH Assessment (Signed)
Patient has been accepted to [facility].  Accepting physician is Dr. Ardyth HarpsHernandez.  Attending Physician will be Dr. Ardyth HarpsHernandez.  Patient has been assigned to room 303-A, by [facility] Charge Nurse [place] [facility].  Call report to [phone].  Representative/Transfer Coordinator is Nicola Girtalvin  [facility] ER Staff (Tokya P., TTS) made aware of acceptance.

## 2015-07-09 NOTE — Progress Notes (Signed)
Admission Note:  D: Pt appeared depressed  With  a flat affect. Pt is redirectable and cooperative with assessment.      A: Pt admitted to unit per protocol, skin assessment and search done and no contraband found.  Pt  educated on therapeutic milieu rules. Pt was introduced to milieu by nursing staff.    R: Pt was receptive to education about the milieu .  15 min safety checks started. writer offered support 

## 2015-07-10 MED ORDER — BENZTROPINE MESYLATE 1 MG PO TABS
0.5000 mg | ORAL_TABLET | Freq: Two times a day (BID) | ORAL | Status: DC
  Administered 2015-07-10 – 2015-07-13 (×7): 0.5 mg via ORAL
  Filled 2015-07-10 (×8): qty 1

## 2015-07-10 MED ORDER — HALOPERIDOL 5 MG PO TABS
5.0000 mg | ORAL_TABLET | Freq: Two times a day (BID) | ORAL | Status: DC
  Administered 2015-07-10 – 2015-07-14 (×9): 5 mg via ORAL
  Filled 2015-07-10 (×9): qty 1

## 2015-07-10 MED ORDER — ARIPIPRAZOLE 15 MG PO TABS
15.0000 mg | ORAL_TABLET | Freq: Every day | ORAL | Status: DC
  Administered 2015-07-10 – 2015-07-11 (×2): 15 mg via ORAL
  Filled 2015-07-10 (×2): qty 1

## 2015-07-10 NOTE — Tx Team (Addendum)
Initial Interdisciplinary Treatment Plan   PATIENT STRESSORS: Medication change or noncompliance Substance abuse   PATIENT STRENGTHS: Motivation for treatment/growth Religious Affiliation Supportive family/friends   PROBLEM LIST: Problem List/Patient Goals Date to be addressed Date deferred Reason deferred Estimated date of resolution  schizophrenia 07/09/15           Substance Abuse  07/09/15                                          DISCHARGE CRITERIA:  Improved stabilization in mood, thinking, and/or behavior Medical problems require only outpatient monitoring Motivation to continue treatment in a less acute level of care  PRELIMINARY DISCHARGE PLAN: Outpatient therapy Participate in family therapy  PATIENT/FAMIILY INVOLVEMENT: This treatment plan has been presented to and reviewed with the patient, [name] Love,The patient and family have been given the opportunity to ask questions and make suggestions.  [name] Abisola Albertha Beattie 07/10/2015, 4:23 AM
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
## 2015-07-10 NOTE — [facility] Group Notes (Addendum)
[facility] LCSW Group Therapy  07/10/2015 3:29 PM  Type of Therapy:  Group Therapy  Participation Level:  Minimal  Participation Quality:  Attentive  Affect:  Flat  Cognitive:  Alert  Insight:  Limited  Engagement in Therapy:  Limited  Modes of Intervention:  Discussion, Education, Socialization and Support  Summary of Progress/Problems: Balance in life: Patients will discuss the concept of balance and how it looks and feels to be unbalanced. Pt will identify areas in their life that is unbalanced and ways to become more balanced.  Pt attended group and stayed the entire time. Pt sat quietly and listened to other group members share.   Symphanie Cederberg L Jontrell Bushong MSW, LCSWA  07/10/2015, 3:29 PM   

## 2015-07-10 NOTE — Plan of Care (Signed)
Problem: Ineffective individual coping Goal: LTG: Patient will report a decrease in negative feelings Outcome: Progressing Patient reports that his mood is a little better today than it was yesterday

## 2015-07-10 NOTE — [facility] Suicide Risk Assessment (Signed)
[facility]   Nursing information obtained from:  Patient Demographic factors:  Male, Adolescent or young adult, Unemployed Current Mental Status:  NA Loss Factors:  NA Historical Factors:  Impulsivity Risk Reduction Factors:  Positive social support  Total Time spent with patient: 1 hour Principal Problem: Schizophrenia, acute undifferentiated (HCC) Diagnosis:   Patient Active Problem List   Diagnosis Date Noted  . 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 Cannabis use disorder, severe, dependence (HCC) [F12.20] 11/26/2014  . Tobacco use disorder [F17.200] 11/26/2014  . Schizophrenia, acute undifferentiated (HCC) [F20.3] 11/24/2014   Subjective Data:   Continued Clinical Symptoms:  Alcohol Use Disorder Identification Test Final Score (AUDIT): 0 The "Alcohol Use Disorders Identification Test", Guidelines for Use in Primary Care, Second Edition.  World Science writerHealth Organization [facility](WHO). Score between 0-7:  no or low risk or alcohol related problems. Score between 8-15:  moderate risk of alcohol related problems. Score between 16-19:  high risk of alcohol related problems. Score 20 or above:  warrants further diagnostic evaluation for alcohol dependence and treatment.   CLINICAL FACTORS:   Alcohol/Substance Abuse/Dependencies Schizophrenia:   Less than 22 years old Currently Psychotic Previous Psychiatric Diagnoses and Treatments   Psychiatric Specialty Exam: ROS  Blood pressure 122/69, pulse 68, temperature 97.9 F (36.6 C), temperature source Oral, resp. rate 18, height 5\' 10"  (1.778 m), weight 91.173 kg (201 lb), SpO2 99 %.Body mass index is 28.84 kg/(m^2).                                                        COGNITIVE FEATURES THAT CONTRIBUTE TO RISK:  Closed-mindedness    SUICIDE RISK:   Moderate:  Frequent suicidal ideation with limited intensity, and duration, some specificity in terms of plans, no associated intent, good  self-control, limited dysphoria/symptomatology, some risk factors present, and identifiable protective factors, including available and accessible social support.  PL[facility] OF CARE[facility]  I certify that inpatient services furnished can reasonably be expected to improve the patient's condition.   Jimmy FootmanHernandez-Gonzalez,  Libby Goehring, MD 07/10/2015, 3:41 PM

## 2015-07-10 NOTE — Plan of Care (Signed)
Problem: Ineffective individual coping Goal: STG: Patient will remain free from self harm Outcome: Progressing Patient remains free from self harm currently     

## 2015-07-10 NOTE — Plan of Care (Signed)
Problem: Alteration in thought process Goal: LTG-Patient behavior demonstrates decreased signs psychosis (Patient behavior demonstrates decreased signs of psychosis to the point the patient is safe to return home and continue treatment in an outpatient setting.)  Outcome: Not Progressing Patient is responding to internal stimuli.      

## 2015-07-10 NOTE — Progress Notes (Signed)
Recreation Therapy Notes  Date: 04.27.17 Time: 9:30 am Location: Craft Room  Group Topic: Leisure Education  Goal Area(s) Addresses:  Patient will identify activities for each letter of the alphabet. Patient will verbalize emotion felt when participating in activities.  Behavioral Response: Attentive, Interactive  Intervention: Leisure Alphabet  Activity: Patients were given an Leisure Information systems managerAlphabet worksheet and as a group picked leisure activities for each letter of the alphabet.  Education: LRT educated patients on why leisure is important.  Education Outcome: In group clarification offered  Clinical Observations/Feedback: Patient completed activity by writing healthy leisure activities down. Patient contributed to group discussion by stating healthy leisure activities and words.   Jacquelynn CreeGreene,Rosaleigh Brazzel M, LRT/CTRS 07/10/2015 10:23 AM

## 2015-07-10 NOTE — Progress Notes (Signed)
D:  Patient is alert but not oriented on the unit this shift.  Patient attended and actively participated in some groups today but not others.  Patient denies suicidal ideation, homicidal ideation, auditory or visual hallucinations at the present time.  Patient is approaching staff and expressing ideas that are not logical.  Some ideas appear to be based in paranoia.     A:  Scheduled medications are administered to patient as per MD orders.  Emotional support and encouragement are provided.  Patient is maintained on q.15 minute safety checks.  Patient is informed to notify staff with questions or concerns. R:  No adverse medication reactions are noted.  Patient is cooperative with medication administration and treatment plan today.  Patient is restless, redirectable and cooperative on the unit at this time.  Patient interacts minimally with others on the unit this shift.  Patient contracts for safety at this time.  Patient remains safe at this time.

## 2015-07-11 MED ORDER — ARIPIPRAZOLE 15 MG PO TABS
30.0000 mg | ORAL_TABLET | Freq: Every day | ORAL | Status: DC
  Administered 2015-07-12 – 2015-07-13 (×2): 30 mg via ORAL
  Filled 2015-07-11 (×3): qty 2

## 2015-07-11 MED ORDER — LORAZEPAM 1 MG PO TABS
1.0000 mg | ORAL_TABLET | Freq: Every day | ORAL | Status: DC
  Administered 2015-07-11 – 2015-07-21 (×11): 1 mg via ORAL
  Filled 2015-07-11 (×11): qty 1

## 2015-07-11 NOTE — Plan of Care (Signed)
Problem: Ineffective individual coping Goal: LTG: Patient will report a decrease in negative feelings Outcome: Progressing Patient reports that he feels better today than he did yesterday     

## 2015-07-11 NOTE — Progress Notes (Signed)
Recreation Therapy Notes  Date: 04.28.17 Time: 9:55 am Location: Craft Room  Group Topic: Coping skills  Goal Area(s) Addresses:  Patient will participate in healthy coping skill. Patient will verbalize benefit of using art as a healthy coping skill.  Behavioral Response: Arrived late, Attentive  Intervention: Coloring  Activity: Patients were given coloring sheets and instructed to color while they thought about the emotions they were experiencing and what their mind was focused on.  Education: LRT educated patients on healthy coping skills.  Education Outcome: In group clarification offered   Clinical Observations/Feedback: Patient arrived to group at approximately 10:15 am. Patient completed activity by coloring a coloring sheet. Patient did not contribute to group discussion.  Jacquelynn CreeGreene,Maliek Schellhorn M, LRT/CTRS 07/11/2015 12:13 PM

## 2015-07-11 NOTE — [facility] Suicide Risk Assessment (Signed)
[facility] INPATIENT:  Family/Significant Other Suicide Prevention Education  Suicide Prevention Education:  Patient Refusal for Family/Significant Other Suicide Prevention Education: The patient [name] Love has refused to provide written consent for family/significant other to be provided Family/Significant Other Suicide Prevention Education during admission and/or prior to discharge.  Physician notified.  CSW completed SPE with the pt.  Dorothe PeaJonathan F Kawan [place] 07/11/2015, 10:10 AM
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
## 2015-07-11 NOTE — [facility] Group Notes (Signed)
[facility] LCSW Group Therapy  07/11/2015 3:12 PM  Type of Therapy:  Group Therapy  Participation Level:  Active  Participation Quality:  Attentive  Affect:  Flat  Cognitive:  Lacking  Insight:  Limited  Engagement in Therapy:  Limited  Modes of Intervention:  Discussion, Education, Socialization and Support  Summary of Progress/Problems: Emotional Regulation: Patients will identify both negative and positive emotions. They will discuss emotions they have difficulty regulating and how they impact their lives. Patients will be asked to identify healthy coping skills to combat unhealthy reactions to negative emotions.   Pt attended group and stayed the entire time. He discussed people making fun of him at home for taking medications, therefore he stops taking them. He states when he gets angry he "poops a lot and eats too much." He states smoking marijuana and drinking a lot of fluids helps him control his emotions.   [name] Love L Beaux Wedemeyer MSW, LCSWA  07/11/2015, 3:12 PM

## 2015-07-11 NOTE — Progress Notes (Signed)
D: Pt denies SI/HI/AVH. Pt is pleasant and cooperative affect is flat and sad, denies pain and discomfort. Pt  appears less anxious and he is interacting with peers and staff appropriately.  A: Pt was offered support and encouragement. Pt was given scheduled medications. Pt was encouraged to attend groups. Q 15 minute checks were done for safety.  R:Pt attends groups and interacts well with peers and staff. Pt is taking medication. Pt has no complaints.Pt receptive to treatment and safety maintained on unit.

## 2015-07-11 NOTE — Progress Notes (Signed)
D:  Patient is alert and questionably oriented on the unit this shift.  Patient forwards little.  Patient attended some groups today.  Patient denies suicidal ideation, homicidal ideation, auditory or visual hallucinations at the present time.  Patient is cooperative, however, thought process remains somewhat disorganized.  A:  Scheduled medications are administered to patient as per MD orders.  Emotional support and encouragement are provided.  Patient is maintained on q.15 minute safety checks.  Patient is informed to notify staff with questions or concerns. R:  No adverse medication reactions are noted.  Patient is cooperative with medication administration and treatment plan today.  Patient is receptive, calm and isolative on the unit at this time.  Patient does not interact with others on the unit this shift.  Patient contracts for safety at this time.  Patient remains safe at this time.
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
## 2015-07-11 NOTE — Plan of Care (Signed)
Problem: Alteration in thought process Goal: LTG-Patient behavior demonstrates decreased signs psychosis (Patient behavior demonstrates decreased signs of psychosis to the point the patient is safe to return home and continue treatment in an outpatient setting.)  Outcome: Progressing Patient demonstrated deceased psychosis.

## 2015-07-11 NOTE — Progress Notes (Signed)
Patient ID: [name] Love, male   DOB: 04/23/1995, 20 y.o.   MRN: [id number] CSW attempted a PSA with the pt on 4/28 but the pt presented with psychotic symptoms and presented as confused when the CSW requested the completion of the PSA and was unable to complete

## 2015-07-11 NOTE — Plan of Care (Signed)
Problem: Ineffective individual coping Goal: STG: Patient will remain free from self harm Outcome: Progressing Patient remains free from self harm currently     
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
## 2015-07-12 MED ORDER — IBUPROFEN 600 MG PO TABS
600.0000 mg | ORAL_TABLET | Freq: Four times a day (QID) | ORAL | Status: DC | PRN
  Administered 2015-07-12: 600 mg via ORAL
  Filled 2015-07-12: qty 1

## 2015-07-12 NOTE — Progress Notes (Signed)
D: Patient appears disorganized and tangential. He states he has SI but denies HI. He also voices AH but doesn't really elaborate. He just says they are deep. Denies VH. Patient attended groups and has been visible in the day room.  A: Medication was given with education. Encouragement was provided.  R: Patient was compliant with medication. He has remained calm and cooperative. Safety maintained with 15 min checks.

## 2015-07-12 NOTE — [facility] Group Notes (Signed)
[facility] LCSW Group Therapy  07/12/2015 3:10 PM  Type of Therapy:  Group Therapy  Participation Level:  Did Not Attend  Modes of Intervention:  Discussion, Education, Socialization and Support  Summary of Progress/Problems: Feelings around Relapse. Group members discussed the meaning of relapse and shared personal stories of relapse, how it affected them and others, and how they perceived themselves during this time. Group members were encouraged to identify triggers, warning signs and coping skills used when facing the possibility of relapse. Social supports were discussed and explored in detail.   Brannen Koppen L Vernica Wachtel  MSW, LCSWA   07/12/2015, 3:10 PM   

## 2015-07-12 NOTE — Plan of Care (Signed)
Problem: Alteration in thought process Goal: STG-Patient is able to sleep at least 6 hours per night Outcome: Progressing Patient slept 6.5 hours previous night.     
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
## 2015-07-12 NOTE — Progress Notes (Signed)
Presents with a flat affect.  Denies SI/HI/AVH fist am.  Denies depression. Later during day verbalized that he was hearing voices and all they were just singing.  Noted pacing the halls at times and then standing in the doorway peeping down the hall.  Support and encouragement offered.  Safety maintained.

## 2015-07-12 NOTE — Progress Notes (Signed)
   07/12/15 0915  Clinical Encounter Type  Visited With Patient  Visit Type Initial;Behavioral Health  Referral From Nurse  Consult/Referral To Chaplain  Spiritual Encounters  Spiritual Needs Emotional;Other (Comment) (Seeking church family)  Stress Factors  Patient Stress Factors Health changes;Loss of control;Major life changes  Visited w/patient who was very quiet and appeared moderately disengaged. Patient requested information in search of a church home. Provided several contact numbers. Chap. Taylyn Brame G. Bronwyn Belasco, ext. [phone]

## 2015-07-13 NOTE — Plan of Care (Signed)
Problem: Alteration in thought process Goal: LTG-Patient behavior demonstrates decreased signs psychosis (Patient behavior demonstrates decreased signs of psychosis to the point the patient is safe to return home and continue treatment in an outpatient setting.)  Outcome: Not Progressing Verbalizes the presence of auditory hallucinations

## 2015-07-13 NOTE — Progress Notes (Signed)
D: Pt denies SI/HI/AVH, but noted responding to internal stimuli. Patient's  thoughts are disorganized and tangential.   Affect is flat and sad, denies pain and discomfort. Pt is pleasant and cooperative with care, appears anxious and restless, frequent redirection used to maintain safety.  A: Pt was offered support and encouragement. Pt was given scheduled medications. Pt was encouraged to attend groups. Q 15 minute checks were done for safety.  R:Pt attend groups and does not interact well peers and staff. Pt is complaint with medication. Pt receptive to treatment and safety maintained on unit.

## 2015-07-13 NOTE — Plan of Care (Signed)
Problem: Alteration in thought process Goal: LTG-Patient behavior demonstrates decreased signs psychosis (Patient behavior demonstrates decreased signs of psychosis to the point the patient is safe to return home and continue treatment in an outpatient setting.)  Outcome: Not Progressing Patient demonstrate SI.

## 2015-07-13 NOTE — Progress Notes (Signed)
Affect flat.  Speech tangential.  Denies SI. Verbalizes that he is hearing voices and that he is seeing shadows.  Further states that the shadows scare him.  Allowed patient verbalize his feelings concerning the shadows then he returned to the dayroom. Support and encouragement offered.  Safety maintained.
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
## 2015-07-13 NOTE — [facility] Group Notes (Signed)
[facility] Group Notes:  (Nursing/MHT/Case Management/Adjunct)  Date:  07/13/2015  Time:  6:11 AM  Type of Therapy:  Group Therapy  Participation Level:  Minimal  Participation Quality:  Inattentive  Affect:  Excited  Cognitive:  Delusional  Insight:  Limited  Engagement in Group:  Off Topic and Poor  Modes of Intervention:  Discussion  Summary of Progress/Problems: When asked about his goal. Pt started laughing inappropriately. He asked staff if this was all a joke, and was possibly responding to voices.   Fanny Skatesshley Imani Kathlen Sakurai 07/13/2015, 6:11 AM

## 2015-07-14 MED ORDER — BENZTROPINE MESYLATE 1 MG PO TABS
1.0000 mg | ORAL_TABLET | Freq: Two times a day (BID) | ORAL | Status: DC
  Administered 2015-07-14 – 2015-07-16 (×5): 1 mg via ORAL
  Filled 2015-07-14 (×4): qty 1

## 2015-07-14 MED ORDER — PALIPERIDONE ER 3 MG PO TB24
9.0000 mg | ORAL_TABLET | Freq: Every day | ORAL | Status: DC
  Administered 2015-07-14: 9 mg via ORAL
  Filled 2015-07-14: qty 3

## 2015-07-14 NOTE — Plan of Care (Signed)
Problem: Alteration in thought process Goal: LTG-Patient behavior demonstrates decreased signs psychosis (Patient behavior demonstrates decreased signs of psychosis to the point the patient is safe to return home and continue treatment in an outpatient setting.)  Outcome: Not Progressing Patient is not free of auditory hallucinations

## 2015-07-14 NOTE — Progress Notes (Signed)
D: Patient is alert and oriented on the unit this shift. Patient not actively participated iin groups today. Patient denies suicidal ideation, homicidal ideation, auditory or visual hallucinations at the present time but does continue to have passing auditory hallucinations A: Scheduled medications are administered to patient as per MD orders. Emotional support and encouragement are provided. Patient is maintained on q.15 minute safety checks. Patient is informed to notify staff with questions or concerns. R: No adverse medication reactions are noted. Patient is cooperative with medication administration  But i believe him to be cheeking his medications .Marland Kitchen. Patient is  calm and cooperative on the unit at this time. Patient  Has minimal interactions with others on the unit this shift. Patient contracts for safety at this time. Patient remains safe at this time.

## 2015-07-14 NOTE — Progress Notes (Signed)
Recreation Therapy Notes  Date: 05.01.17 Time: 9:30 am Location: Craft Room  Group Topic: Self-expression  Goal Area(s) Addresses:  Patient will be able to identify a color that represents each emotion. Patient will verbalize benefit of using art as a means of self-expression. Patient will verbalize one positive emotion experienced while participating in activity.  Behavioral Response: Attentive, Interactive  Intervention: The Colors Within Me  Activity: Patients were given a blank face worksheet and instructed to pick a color for each emotion they were feeling and show how much of that emotion they were feeling on the face worksheet.  Education: LRT educated patients on other forms of self-expression.  Education Outcome: In group clarification offered  Clinical Observations/Feedback: Patient completed activity by picking colors and showing on the face how much of that emotion he was feeling. Patient contributed to group discussion by stating an emotion he was currently feeling which was "ghetto" and what emotion he felt while he was coloring.Jacquelynn Cree.  Kushal Saunders M, LRT/CTRS 07/14/2015 10:29 AM

## 2015-07-14 NOTE — Progress Notes (Signed)
D: Pt denies SI/HI/AVH, but noted responding to internal stimuli. Pt is pleasant and cooperative. Patient's thoughts are disorganized, tangential with some delayed response noted. Pt appears less anxious, however he is not interacting with peers and staff appropriately.  A: Pt was offered support and encouragement. Pt was given scheduled medications. Pt was encouraged to attend groups. Q 15 minute checks were done for safety.  R:Pt attends group, but does not nteracts well with peers and staff. Pt is taking medication. Pt has no complaints.Pt receptive to treatment and safety maintained on unit.

## 2015-07-14 NOTE — [facility] Group Notes (Signed)
[facility] Group Notes:  (Nursing/MHT/Case Management/Adjunct)  Date:  07/14/2015  Time:  3:50 PM  Type of Therapy:  Psychoeducational Skills  Participation Level:  Minimal  Participation Quality:  Appropriate  Affect:  Appropriate  Cognitive:  Appropriate and Lacking  Insight:  Lacking  Engagement in Group:  Poor  Modes of Intervention:  Discussion, Education and Support  Summary of Progress/Problems:  [name] 07/14/2015, 3:50 PM

## 2015-07-14 NOTE — [facility] Group Notes (Signed)
[facility] LCSW Group Therapy  07/14/2015 2:52 PM  Type of Therapy:  Group Therapy  Participation Level:  Active  Participation Quality:  Attentive and Sharing  Affect:  Blunted  Cognitive:  Disorganized  Insight:  Limited  Engagement in Therapy:  Developing/Improving  Modes of Intervention:  Discussion, Socialization and Support  Summary of Progress/Problems: Patient attended and participated in group discussion but was off topic. Patient was not able to focus on topic during discussion but was redirectable. Patient shared that he lives with his mom. Patient asked about group homes and if the social workers could help him with finding a group home. Patient states that his mom applied for disability but he was denied. Patient shared that is his only challenge and has a supportive family that will help him with obtaining disability.    Lulu RidingIngle, Ely Ballen T, MSW, LCSW 07/14/2015, 2:52 PM

## 2015-07-14 NOTE — Plan of Care (Signed)
Problem: Alteration in thought process Goal: LTG-Patient behavior demonstrates decreased signs psychosis (Patient behavior demonstrates decreased signs of psychosis to the point the patient is safe to return home and continue treatment in an outpatient setting.)  Outcome: Not Progressing Patient noted responding to internal stimuli.      

## 2015-07-14 NOTE — Progress Notes (Signed)
Denies SI/HI/AVH this shift.  Flat affect.  Paces the halls.  Exhibits paranoid behaviors.  No interaction noted with peers.  Reluctant to talk with this Clinical research associatewriter. Medication compliant. Support and encouragement offered.  Safety maintained.

## 2015-07-14 NOTE — Plan of Care (Signed)
Problem: Consults [facility] Patient Education Outcome: Not Progressing Patient instructed and he states understanding of the group program and he has to participate to accomplich his goals
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
## 2015-07-15 LAB — LIPID PANEL
CHOL/HDL RATIO: 3.9 ratio
CHOLESTEROL: 225 mg/dL — AB (ref 0–200)
HDL: 58 mg/dL (ref >40)
LDL Cholesterol: 152 mg/dL — ABNORMAL HIGH (ref 0–99)
Triglycerides: 74 mg/dL (ref <150)
VLDL: 15 mg/dL (ref 0–40)

## 2015-07-15 MED ORDER — PALIPERIDONE ER 3 MG PO TB24
12.0000 mg | ORAL_TABLET | Freq: Every day | ORAL | Status: DC
  Administered 2015-07-15 – 2015-07-24 (×10): 12 mg via ORAL
  Filled 2015-07-15 (×11): qty 4

## 2015-07-15 MED ORDER — HALOPERIDOL 2 MG PO TABS
2.5000 mg | ORAL_TABLET | Freq: Two times a day (BID) | ORAL | Status: DC
  Administered 2015-07-15 – 2015-07-16 (×3): 2.5 mg via ORAL
  Filled 2015-07-15 (×2): qty 1
  Filled 2015-07-15: qty 2
  Filled 2015-07-15: qty 1

## 2015-07-15 NOTE — Tx Team (Signed)
Interdisciplinary Treatment Plan Update (Adult)  Date:  07/14/2015 Time Reviewed:  4:56 PM Progress in Treatment: Improving  Attending groups: Continuing to assess, patient new to milieu  Participating in groups: Continuing to assess, patient new to milieu  Taking medication as prescribed: Yes  Tolerating medication: Yes  Family/Significant other contact made: No, CSW assessing for appropriate contacts  Patient understands diagnosis: Yes  Discussing patient identified problems/goals with staff: Yes  Medical problems stabilized or resolved: Yes  Denies suicidal/homicidal ideation: Yes  Issues/concerns per patient self-inventory: Yes  Other:   New problem(s) identified: N/A   Discharge Plan or Barriers: CSW continuing to assess, patient new to milieu.   Reason for Continuation of Hospitalization:  Depression  Anxiety  Medication Stabilization   Comments: N/A   Estimated length of stay: 7 days   Patient is a 20 y.o. male with a prior history of schizophrenia who presented to [facility] under petition on 4/25. Pt lives in [place]. The petition was completed by his mother mother who was concerned about aggressive behavior towards family members,    Review of initial/current patient goals per problem list:  1. Goal(s): Patient will participate in aftercare plan   Met: No  Target date: 3-5 days post admission date   As evidenced by: Patient will participate within aftercare plan AEB aftercare provider and housing plan at discharge being identified. \ 4/28: CSW still assessing for appropriate contacts  2. Goal (s): Patient will exhibit decreased depressive symptoms and suicidal ideations.   Met: No  Target date: 3-5 days post admission date   As evidenced by: Patient will utilize self-rating of depression at 3 or below and demonstrate decreased signs of depression or be deemed stable  for discharge by MD.  4/28: Goal progressing.  3. Goal(s): Patient will demonstrate decreased signs and symptoms of anxiety.   Met: No  Target date: 3-5 days post admission date   As evidenced by: Patient will utilize self-rating of anxiety at 3 or below and demonstrated decreased signs of anxiety, or be deemed stable for discharge by MD 4/28: Goal progressing.  4. Goal(s): Patient will demonstrate decreased signs of withdrawal due to substance abuse   Met: No  Target date: 3-5 days post admission date   As evidenced by: Patient will produce a CIWA/COWS score of 0, have stable vitals signs, and no symptoms of withdrawal  4/28: Goal progressing.  5. Goal(s): Patient will demonstrate decreased signs of psychosis  * Met: No * Target date: 3-5 days post admission date  * As evidenced by: Patient will demonstrate decreased frequency of AVH or return to baseline function  4/28: Goal progressing.  6. Goal (s): Patient will demonstrate decreased signs of mania  * Met: No * Target date: 3-5 days post admission date  * As evidenced by: Patient demonstrate decreased signs of mania AEB decreased mood instability and demonstration of stable mood  4/28: Goal progressing.  Attendees: [name] 5/2/20174:56 PM  Family:   5/2/20174:56 PM  Physician: Merlyn Albert  5/2/20174:56 PM  Nursing:   Polly Cobia, RN 5/2/20174:56 PM  Case Manager:   5/2/20174:56 PM  Counselor:  Dossie Arbour, LCSW 5/2/20174:56 PM  Other:  Everitt Amber, LRT 5/2/20174:56 PM  Other:  Carmell Austria, LCSW 5/2/20174:56 PM  Other:   5/2/20174:56 PM  Other:  5/2/20174:56 PM  Other:  5/2/20174:56 PM  Other:  5/2/20174:56 PM  Other:  5/2/20174:56 PM  Other:  5/2/20174:56 PM  Other:  5/2/20174:56 PM  Other:   5/2/20174:56 PM   Scribe for Treatment Team:   August Saucer, 07/15/2015, 4:56 PM, MSW, LCSW

## 2015-07-15 NOTE — [facility] Group Notes (Signed)
[facility] Group Notes:  (Nursing/MHT/Case Management/Adjunct)  Date:  07/15/2015  Time:  2:18 PM  Type of Therapy:  Psychoeducational Skills  Participation Level:  Active  Participation Quality:  Redirectable and Sharing  Affect:  Flat  Cognitive:  Appropriate  Insight:  Appropriate  Engagement in Group:  Limited and Off Topic  Modes of Intervention:  Discussion and Education  Summary of Progress/Problems:  [name] Love [name] 07/15/2015, 2:18 PM
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
## 2015-07-15 NOTE — Progress Notes (Signed)
Recreation Therapy Notes  Date: 05.02.17 Time: 9:30 am Location: Craft Room  Group Topic: Goal Setting  Goal Area(s) Addresses:  Patient will be able to identify a personal goal. Patient will be able to identify a supportive statement.  Behavioral Response: Intermittently Attentive  Intervention: Step By Step  Activity: Patients were given a foot worksheet and instructed to write a goal inside the foot and to write supportive statements outside of the foot.  Education: LRT educated patients on why it is important to set goals.  Education Outcome: In group clarification offered   Clinical Observations/Feedback: Patient colored foot and verbalized his goal. Patient wrote his goal down after he talked about it. Patient asked if LRT was single. LRT redirected patient and patient complied.  Jacquelynn CreeGreene,Doron Shake M, LRT/CTRS 07/15/2015 10:15 AM

## 2015-07-15 NOTE — Progress Notes (Signed)
D:  Per pt self inventory pt reports sleeping good, appetite good, energy level low, ability to pay attention good, rates depression at a 0 out of 10, hopelessness at a 0 out of 10, anxiety at a 0 out of 10, denies HI/AVH, pt appears to be responding to internal stimuli, endorses SI on and off, contracts for safety, goal today: "look around, see things", pleasant during interaction, appears paranoid, looking over shoulder  A:  Emotional support provided, Encouraged pt to continue with treatment plan and attend all group activities, q15 min checks maintained for safety.  R:  Pt is receptive, going to groups, pleasant and cooperative with staff and other patients on the unit.

## 2015-07-16 LAB — HEMOGLOBIN A1C: HEMOGLOBIN A1C: 5.4 % (ref 4.0–6.0)

## 2015-07-16 MED ORDER — AMANTADINE HCL 100 MG PO CAPS
100.0000 mg | ORAL_CAPSULE | Freq: Two times a day (BID) | ORAL | Status: DC
  Administered 2015-07-16 – 2015-07-25 (×19): 100 mg via ORAL
  Filled 2015-07-16 (×20): qty 1

## 2015-07-16 NOTE — Plan of Care (Signed)
Problem: Ineffective individual coping Goal: LTG: Patient will report a decrease in negative feelings Outcome: Progressing Patient does report his feelings to nurse when asked CTownsend RN

## 2015-07-16 NOTE — Plan of Care (Signed)
Problem: Consults Goal: [facility] Patient Education Outcome: Not Progressing Patient paranoid, suspicious not able to take in any teaching materials [place]ownsend RN

## 2015-07-16 NOTE — [facility] Group Notes (Signed)
[facility] LCSW Group Therapy  07/16/2015 11:32 AM  Type of Therapy:  Group Therapy  Participation Level:  Minimal  Participation Quality:  Attentive  Affect:  Blunted  Cognitive:  Disorganized  Insight:  Limited  Engagement in Therapy:  Limited  Modes of Intervention:  Socialization and Support  Summary of Progress/Problems: Patient attended and participated in group discussion minimally introducing himself and sharing during introductions. Patient was attentive and left in the middle of group but did return.    Lulu RidingIngle, Melchizedek Espinola T, MSW, LCSW 07/16/2015, 11:32 AM

## 2015-07-16 NOTE — Progress Notes (Signed)
Calm and cooperative. Flat affect. Thought blocking. Responding to internal stimuli. Med compliant. Attends group. Limited interaction with peers and staff. No c/o pain/discomfort noted.

## 2015-07-16 NOTE — Plan of Care (Signed)
Problem: Ineffective individual coping Goal: STG: Pt will be able to identify effective and ineffective STG: Pt will be able to identify effective and ineffective coping patterns  Outcome: Not Met (add Reason) Calm and cooperative. Remains guarded and suspicious. Responding to internal stimuli. Thought blocking. Med compliant. No behavior issues noted. Will continue to monitor for safety and behavior.
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
## 2015-07-16 NOTE — Progress Notes (Signed)
D: Patient is alert and oriented on the unit this shift. Patient attended  group today with minimal input . Patient denies suicidal ideation, homicidal ideation, auditory or visual hallucinations at the present time.  A: Scheduled medications are administered to patient as per MD orders. Emotional support and encouragement are provided. Patient is maintained on q.15 minute safety checks. Patient is informed to notify staff with questions or concerns. R: No adverse medication reactions are noted. Patient is cooperative with medication administration and treatment plan today. Patient is cooperative on the unit at this time. Patient does not interact with others  on the unit this shift. Patient contracts for safety at this time. Patient remains safe at this time.

## 2015-07-16 NOTE — Progress Notes (Signed)
D:  Per pt self inventory pt reports sleeping fair, appetite poor, energy level hyper, ability to pay attention poor, rates depression at a 5  out of 10, hopelessness at a 5 out of 10, anxiety at a 5 out of 10, denies SI/HI/AVH, goal today: "have peace, discharge", denies SI/VH, endorses HI towards his brother, endorsing AH voices saying "get out of bed", bizarre behavior, smiling inappropriately at times.  A:  Emotional support provided, Encouraged pt to continue with treatment plan and attend all group activities, q15 min checks maintained for safety.  R:  Pt is receptive, going to groups,med compliant, cooperative with staff and other patients on the unit.

## 2015-07-16 NOTE — Progress Notes (Signed)
C/o H/A. Pain scale 6/10. Tylenol 650 mg po given as ordered PRN. No behavior issues noted. Will continue to monitor.
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
## 2015-07-16 NOTE — Progress Notes (Signed)
Recreation Therapy Notes   Date: 05.03.17 Time: 9:30 am Location: Craft Room  Group Topic: Self-esteem  Goal Area(s) Addresses:  Patient will write positive trait about self. Patient will verbalize benefit of healthy self-esteem.  Behavioral Response: Attentive, Left early  Intervention: Positive Reinforcement  Activity: Patients were given construction paper and encouraged to write as many positive traits about themselves.  Education: LRT educated patients on ways they can increase their self-esteem.  Education Outcome: Patient left before LRT educated group.  Clinical Observations/Feedback: Patient completed activity by writing positive traits. Patient left group at approximately 10:01 am. Patient returned to group at approximately 10:05 am but stood by the door as LRT finished group.  Jacquelynn CreeGreene,Ayodele Hartsock M, LRT/CTRS 07/16/2015 10:14 AM

## 2015-07-16 NOTE — [facility] Group Notes (Signed)
[facility] Note   07/16/2015 11:31 AM  Participation Quality:   Patient was called but did not attend group.   Lulu RidingIngle, Winry Egnew T, MSW, LCSW

## 2015-07-17 NOTE — Progress Notes (Signed)
Pt presents with flat affect. Denies SI, endorses HI towards little sister, pt would not elaborate. Reports seeing things in his eyes this morning.  Encouraged pt to come to staff with any hallucinations, pt receptive and remains safe on unit with q 15 min checks.

## 2015-07-17 NOTE — [facility] Group Notes (Signed)
[facility] Discharge Planning [facility] Note   07/17/2015 12:25 PM  Participation Quality:  Patient attended and participated in group discussion sharing his SMART goal was to "discharge, stay focused and listen to the doctor". Patient received a daily workbook on Leisure Activities. Patient left group early after 15 minutes and did not return.  Mood/Affect:  Blunted  Depression Rating:  -1  Anxiety Rating:  1  Thoughts of Suicide:  No Will you contract for safety?   Yes  Current AVH:  No  Plan for Discharge/Comments:  Home with self  Transportation Means: mom will pick up  Supports: family is supportive, specifically mom  Lulu RidingIngle, Thecla Forgione T, MSW, LCSW
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
## 2015-07-17 NOTE — Plan of Care (Signed)
Problem: Ineffective individual coping Goal: STG: Pt will be able to identify effective and ineffective STG: Pt will be able to identify effective and ineffective coping patterns  Outcome: Not Progressing Does not report any coping skills

## 2015-07-17 NOTE — Progress Notes (Signed)
Recreation Therapy Notes  Date: 05.04.17 Time: 9:30 am Location: Craft Room  Group Topic: Leisure Education  Goal Area(s) Addresses:  Patient will identify things they are grateful for.  Behavioral Response: Attentive, Interactive, Inappropriate  Intervention: Grateful Wheel  Activity: Patients were given an "I Am Grateful For" worksheet and instructed to write things they are grateful for under each category.  Education: LRT educated patients on why it is important to be grateful.  Education Outcome: In group clarification offered  Clinical Observations/Feedback: Patient participated in activity by saying things he was grateful for. When asked about what they were thankful with with knowledged and education, patient said "sex". LRT redirected patient and patient complied. Patient did not write them on his worksheet.  Jacquelynn CreeGreene,Lura Falor M, LRT/CTRS 07/17/2015 10:22 AM

## 2015-07-17 NOTE — [facility] Group Notes (Signed)
[facility] LCSW Group Therapy  07/17/2015 2:18 PM  Type of Therapy:  Group Therapy  Participation Level:  Did Not Attend  Summary of Progress/Problems: Patient was called to group but did not attend.  Lulu RidingIngle, Danniel Tones T, MSW, LCSW 07/17/2015, 2:18 PM

## 2015-07-17 NOTE — [facility] Group Notes (Signed)
[facility] Group Notes:  (Nursing/MHT/Case Management/Adjunct)  Date:  07/17/2015  Time:  4:38 AM  Type of Therapy:  Group Therapy  Participation Level:  Minimal  Participation Quality:  Attentive  Affect:  Flat  Cognitive:  Alert  Insight:  Lacking  Engagement in Group:  Limited  Modes of Intervention:  Discussion  Summary of Progress/Problems: Pt attended group, but did not respond when asked about his goal.   Veva Holesshley Imani Berry Godsey 07/17/2015, 4:38 AM

## 2015-07-17 NOTE — [facility] Group Notes (Signed)
[facility] LCSW Group Therapy  07/17/2015 11:55 AM  Type of Therapy:  Group Therapy  Participation Level:  Did Not Attend  Summary of Progress/Problems: Patient was called to group but did not attend.   Rosalin Buster T, MSW, LCSW 07/17/2015, 11:55 AM  

## 2015-07-17 NOTE — [facility] Group Notes (Signed)
[facility] Group Notes:  (Nursing/MHT/Case Management/Adjunct)  Date:  07/17/2015  Time:  4:10 PM  Type of Therapy:  Group Therapy  Participation Level:  Did Not Attend  Chaka Boyson De'Chelle Raghav Verrilli 07/17/2015, 4:10 PM

## 2015-07-18 NOTE — [facility] Group Notes (Signed)
[facility] Group Notes:  (Nursing/MHT/Case Management/Adjunct)  Date:  07/18/2015  Time:  1:32 AM  Type of Therapy:  Psychoeducational Skills  Participation Level:  Minimal  Participation Quality:  Resistant  Affect:  Blunted and Not Congruent  Cognitive:  Confused and Lacking  Insight:  Lacking  Engagement in Group:  Distracting, Limited, Off Topic and Poor  Modes of Intervention:  Discussion and Exploration  Summary of Progress/Problems:  Danee Soller R Arshawn Valdez 07/18/2015, 1:32 AM

## 2015-07-18 NOTE — [facility] Group Notes (Signed)
[facility] LCSW Group Therapy  07/18/2015 2:04 PM  Type of Therapy:  Group Therapy  Participation Level:  Active  Participation Quality:  Attentive and Redirectable  Affect:  Blunted  Cognitive:  Disorganized  Insight:  Limited  Engagement in Therapy:  Improving  Modes of Intervention:  Discussion, Socialization and Support  Summary of Progress/Problems: Patient attended and participated in group discussion but was disorganized. Patient introduced himself and shared in an introductory exercise that his dream vacation would be "the hospital because they treat me like I'm special here". Patient shared that he is okay with his diagnosis and described symptoms as hallucinations and shared that he experiences fear with his diagnosis of Schizophrenia sometimes but considers it normal for him. Patient was attentive throughout group discussion but bizarre and off topic.   [name] Love, [name] Love, MSW, LCSW 07/18/2015, 2:04 PM

## 2015-07-18 NOTE — Progress Notes (Signed)
Recreation Therapy Notes  Date: 05.05.17 Time: 9:30 am Location: Craft Room  Group Topic: Coping Skills  Goal Area(s) Addresses:  Patient will participate in healthy coping skill.  Behavioral Response: Attentive, Interactive  Intervention: Coloring  Activity: Patients were instructed to color coloring sheets and think about what emotions they were experiencing as well as what they were thinking about while they were coloring.  Education:LRT educated patients on healthy coping skills.  Education Outcome: In group clarification offered  Clinical Observations/Feedback: Patient colored coloring sheet. Patient contributed to group discussion by stating what emotion he felt and what his mind was focused on while he was coloring.  Jacquelynn CreeGreene,Calven Gilkes M, LRT/CTRS 07/18/2015 10:25 AM

## 2015-07-18 NOTE — Tx Team (Signed)
Interdisciplinary Treatment Plan Update (Adult)  Date:  07/18/2015 Time Reviewed:  3:35 PM  Progress in Treatment: Attending groups: Yes. Participating in groups:  Yes. Taking medication as prescribed:  Yes. Tolerating medication:  Yes. Family/Significant othe contact made:  Yes, individual(s) contacted:  mother Patient understands diagnosis:  Yes. Discussing patient identified problems/goals with staff:  Yes. Medical problems stabilized or resolved:  Yes. Denies suicidal/homicidal ideation: Yes. Issues/concerns per patient self-inventory:  Yes. Other:  New problem(s) identified: No, Describe:  NA  Discharge Plan or Barriers: Pt plans to return home and follow up with outpatient.    Reason for Continuation of Hospitalization: Hallucinations Medication stabilization  Comments:Today he denies depressed mood, major problems with appetite, energy, or concentration. Denies suicidality, homicidality or having auditory or visual hallucinations. A few minutes later after we finished talking to the patient he reported to the nurses that he was having visual hallucinations. He did the same thing yesterday as soon as we finished the conversation with him he went to the nurse and told her he was suicidal.  Estimated length of stay: 7 days  New goal(s):NA  Review of initial/current patient goals per problem list:   1.  Goal(s): Patient will participate in aftercare plan * Met:  * Target date: at discharge * As evidenced by: Patient will participate within aftercare plan AEB aftercare provider and housing plan at discharge being identified.   2.  Goal (s): Patient will exhibit decreased depressive symptoms and suicidal ideations. * Met:  *  Target date: at discharge * As evidenced by: Patient will utilize self rating of depression at 3 or below and demonstrate decreased signs of depression or be deemed stable for discharge by MD.  3.  Goal (s): Patient will demonstrate decreased symptoms  of psychosis. * Met: No  *  Target date: at discharge * As evidenced by: Patient will not endorse signs of psychosis or be deemed stable for discharge by MD.   Attendees: [name] 5/5/20173:35 PM  Family:   5/5/20173:35 PM  Physician:  Dr. Jerilee Hoh   5/5/20173:35 PM  Nursing:   Elige Radon, RN 5/5/20173:35 PM  Case Manager:   5/5/20173:35 PM  Counselor:   5/5/20173:35 PM  Other:  Wray Kearns, [place] 5/5/20173:35 PM  Other:  Everitt Amber, [place]  5/5/20173:35 PM  Other:   5/5/20173:35 PM  Other:  5/5/20173:35 PM  Other:  5/5/20173:35 PM  Other:  5/5/20173:35 PM  Other:  5/5/20173:35 PM  Other:  5/5/20173:35 PM  Other:  5/5/20173:35 PM  Other:   5/5/20173:35 PM   Scribe for Treatment Team:   Wray Kearns MSW, [place] , 07/18/2015, 3:35 PM

## 2015-07-18 NOTE — Progress Notes (Signed)
Presents with flat affect. 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  Attends groups and participates appropriately per group note.  Visible in the milieu.  Noted walking with peer earlier and conversing.   Denies SI/HI/AVH.  States "I was hearing voices last night and seeing shadows. " Medication compliant.  Support and encouragement offered.  Safety maintained.
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
## 2015-07-18 NOTE — [facility] Group Notes (Signed)
[facility] Group Notes:  (Nursing/MHT/Case Management/Adjunct)  Date:  07/18/2015  Time:  4:18 PM  Type of Therapy:  Psychoeducational Skills  Participation Level:  Active  Participation Quality:  Appropriate, Attentive and Sharing  Affect:  Appropriate  Cognitive:  Alert and Appropriate  Insight:  Appropriate  Engagement in Group:  Engaged  Modes of Intervention:  Discussion, Education and Support  Summary of Progress/Problems:  Chancy MilroyLaquanda Y Devontay Celaya 07/18/2015, 4:18 PM

## 2015-07-18 NOTE — Plan of Care (Signed)
Problem: Alteration in thought process Goal: LTG-Patient behavior demonstrates decreased signs psychosis (Patient behavior demonstrates decreased signs of psychosis to the point the patient is safe to return home and continue treatment in an outpatient setting.) Outcome: Not Progressing Patient responding to internal stimuli.      

## 2015-07-19 DIAGNOSIS — F203 Undifferentiated schizophrenia: Secondary | ICD-10-CM

## 2015-07-19 NOTE — Plan of Care (Signed)
Problem: Ineffective individual coping Goal: LTG: Patient will report a decrease in negative feelings Outcome: Progressing Patient does not report unless asked about his negative feelings Tax adviserCTownsend RN

## 2015-07-19 NOTE — Plan of Care (Signed)
Problem: Alteration in thought process Goal: LTG-Patient behavior demonstrates decreased signs psychosis (Patient behavior demonstrates decreased signs of psychosis to the point the patient is safe to return home and continue treatment in an outpatient setting.)  Outcome: Progressing Pt appears to have somatic delusions and endorsed hearing voices earlier today.

## 2015-07-19 NOTE — Progress Notes (Signed)
D:  Per pt self inventory pt reports sleeping good, appetite good, denies SI/HI/AVH, bizarre during interaction, thought blocking, slow response time, guarded, appears to still be responding to internal stimuli.      A:  Emotional support provided, Encouraged pt to continue with treatment plan and attend all group activities, q15 min checks maintained for safety.  R:  Pt is receptive, going to groups, cooperative with staff and other patients on the unit.

## 2015-07-19 NOTE — Progress Notes (Signed)
D: Observed pt in room lying in bed. Patient alert and oriented to person, time and situation. Pt was not certain which hospital or city he was in.  Patient denies SI/HI/VH. Pt denies AH this evening, but endorsed hearing voices earlier in the day "telling me to do low-life stuff." Pt would not elaborate on what "low-life stuff" meant. Pt affect is flat. Pt appears to be thought blocking, and  Responses are delayed with questions having to be repeated. Pt indicated his mood was "back and forth." Pt c/o of having a stitch in his right thumb, when writer enquired, the pt indicated he has stitches placed in his thumb when he was 7-8 yrs old. Writer did not see stitch. Pt isolative to room this evening. Pt appears suspicious of environment, interacts minimally, and forwards little with staff.  A: Offered active listening and support. Provided therapeutic communication. Administered scheduled medications. Encouraged pt to communicate more with staff and peers. R: Pt pleasant and cooperative. Pt medication compliant. Will continue Q15 min. checks. Safety maintained.

## 2015-07-19 NOTE — [facility] Group Notes (Signed)
[facility] LCSW Group Therapy  07/19/2015 2:15 PM  Type of Therapy:  Group Therapy  Participation Level:  Minimal  Participation Quality:  Attentive  Affect:  Flat  Cognitive:  Alert  Insight:  Limited  Engagement in Therapy:  Limited  Modes of Intervention:  Discussion, Education, Socialization and Support  Summary of Progress/Problems: Pt will identify unhealthy thoughts and how they impact their emotions and behavior. Pt will be encouraged to discuss these thoughts, emotions and behaviors with the group. Pt attended group and stayed the entire time. He sat quietly and listened to other group members share.   Barlow Harrison L Shakina Choy MSW, LCSWA  07/19/2015, 2:15 PM
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
## 2015-07-19 NOTE — Progress Notes (Signed)
D: Patient is alert and oriented on the unit this shift. Patient  Did not actively   participate  in group today. Patient denies suicidal ideation, homicidal ideation  visual hallucinations at the present time.  A: Scheduled medications are administered to patient as per MD orders. Emotional support and encouragement are provided. Patient is maintained on q.15 minute safety checks. Patient is informed to notify staff with questions or concerns. R: No adverse medication reactions are noted. Patient is cooperative with medication administration and treatment plan today. Patient is non receptive, calm and cooperative on the unit at this time. Patient isolative and does not interact  with others on the unit this shift. Patient contracts for safety at this time. Patient remains safe at this time.

## 2015-07-20 NOTE — Progress Notes (Signed)
Denies SI/HI/AVH.  Denies depression.  Affect flat.  Laughs inappropriately at times.  Support and encouragement offered.  Safety maintained.
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
## 2015-07-20 NOTE — [facility] Group Notes (Signed)
[facility] Group Notes:  (Nursing/MHT/Case Management/Adjunct)  Date:  07/20/2015  Time:  9:46 PM  Type of Therapy:  Group Therapy  Participation Level:  Active  Participation Quality:  Appropriate  Affect:  Appropriate  Cognitive:  Appropriate  Insight:  Appropriate  Engagement in Group:  Engaged  Modes of Intervention:  Discussion  Summary of Progress/Problems:  [name] [name] [name] [name] Love 07/20/2015, 9:46 PM

## 2015-07-21 MED ORDER — HALOPERIDOL 2 MG PO TABS
2.0000 mg | ORAL_TABLET | Freq: Two times a day (BID) | ORAL | Status: DC
  Administered 2015-07-21 – 2015-07-22 (×3): 2 mg via ORAL
  Filled 2015-07-21 (×3): qty 1

## 2015-07-21 MED ORDER — PALIPERIDONE PALMITATE 234 MG/1.5ML IM SUSP
234.0000 mg | Freq: Once | INTRAMUSCULAR | Status: AC
  Administered 2015-07-21: 234 mg via INTRAMUSCULAR
  Filled 2015-07-21: qty 1.5

## 2015-07-21 NOTE — Plan of Care (Signed)
Problem: Ineffective individual coping Goal: STG: Pt will be able to identify effective and ineffective STG: Pt will be able to identify effective and ineffective coping patterns  Outcome: Not Progressing Patient reluctant to talk.

## 2015-07-21 NOTE — Plan of Care (Signed)
Problem: Ineffective individual coping Goal: STG-Increase in ability to manage activities of daily living Outcome: Progressing Patient washed up this evening and asked for another toothbrush and toothpaste.

## 2015-07-21 NOTE — Progress Notes (Signed)
Affect flat.  Denies SI/HI/AVH.  Reserved.  Medication and group compliant.  Support and encouragement offered.  Safety maintained.

## 2015-07-21 NOTE — Progress Notes (Signed)
Recreation Therapy Notes  Date: 05.08.17 Time: 9:30 am Location: Craft Room  Group Topic: Self-expression  Goal Area(s) Addresses:  Patient will identify one color per emotion listed on the wheel. Patient will verbalize one emotion experienced during session. Patient will be educated on other forms of self-expression.  Behavioral Response: Attentive, Interactive  Intervention: Emotion Wheel  Activity: Patients were given an Emotion Wheel worksheet and instructed to pick a color for each emotion listed.  Education: LRT educated group on other forms of self-expression.  Education Outcome: In group clarification offered  Clinical Observations/Feedback: Patient completed activity by picking a color for each emotion. Patient contributed to group discussion by stating some of the colors he picked for certain emotions.  Jacquelynn CreeGreene,Tamecia Mcdougald M, LRT/CTRS 07/21/2015 10:28 AM
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
## 2015-07-21 NOTE — [facility] Group Notes (Signed)
[facility] Group Notes:  (Nursing/MHT/Case Management/Adjunct)  Date:  07/21/2015  Time:  4:49 PM  Type of Therapy:  Psychoeducational Skills  Participation Level:  Did Not Attend  Participation QualitySummary of Progress/Problems:  Mayra NeerJackie L Emmauel Hallums 07/21/2015, 4:49 PM

## 2015-07-21 NOTE — Plan of Care (Signed)
Problem: Alteration in thought process Goal: LTG-Patient is able to perceive the environment accurately Outcome: Progressing When asked to come to the medication room, patient is able to do so.

## 2015-07-21 NOTE — Progress Notes (Signed)
D: Patient appears flat. He denies SI/HI and currently denies voices. He attended group. He denies pain. Patient was seen sitting in the corner in the back of the [facility]. When addressed the patient stated he was dehydrated. When asked if he wanted to drink some water he stated he was an athlete which meant he needed gatorade. This Clinical research associatewriter then preceded to tell him to try water first. He then accompanied this Clinical research associatewriter to the day room and he drank and then went to his room.  A: Medication was given with education. Encouragement was provided.  R: Patient was compliant with the medication. He has remained calm and cooperative. Safety maintained with 15 min checks.

## 2015-07-21 NOTE — [facility] Group Notes (Signed)
[facility] LCSW Group Therapy  07/21/2015 3:05 PM  Type of Therapy: Group Therapy  Participation Level: Did Not Attend  Summary of Progress/Problems: Patient was called to group but did not attend.   Lulu RidingIngle, Anabeth Chilcott T, MSW, LCSW 07/21/2015, 3:05 PM

## 2015-07-21 NOTE — Progress Notes (Signed)
D: Patient appears flat. He denies SI/HI. States he's hearing voices to "get out". He stated he got out of his room and then they stopped. He remained in the day room the remainder of the evening. He attended group. He denies pain. He denied visual hallucinations. A: Medication was given with education. Encouragement was provided.  R: Patient was compliant with the medication. He has remained calm and cooperative. Safety maintained with 15 min checks.

## 2015-07-22 MED ORDER — IBUPROFEN 800 MG PO TABS
800.0000 mg | ORAL_TABLET | Freq: Once | ORAL | Status: DC
Start: 2015-07-22 — End: 2015-07-25
  Filled 2015-07-22: qty 1

## 2015-07-22 MED ORDER — HALOPERIDOL 2 MG PO TABS: 2.0000 mg | ORAL_TABLET | Freq: Every day | ORAL | Status: DC
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
## 2015-07-22 NOTE — Progress Notes (Signed)
D: Patient appears brighter this evening. He denies SI/HI and currently denies voices. He attended group. He stated they talked about coping skills in groups. He then discussed his coping skills with this Clinical research associatewriter. He reported listening to music and playing sports were his coping skills. He denies pain.  A: Medication was given with education. Encouragement was provided.  R: Patient was compliant with the medication. He has remained calm and cooperative. Safety maintained with 15 min checks.

## 2015-07-22 NOTE — Progress Notes (Signed)
D: Patient has flat affect, denies SI/HI and currently denies voices, attended some groups today.   A:  Emotional support provided, Encouraged pt to continue with treatment plan and attend all group activities, q15 min checks maintained for safety.  R:  Pt is receptive, going to groups, pleasant and cooperative with staff and other patients on the unit, compliant with the medication, safety maintained with 15 min checks.

## 2015-07-22 NOTE — Progress Notes (Signed)
Recreation Therapy Notes  Date: 05.09.17 Time: 9:30 am Location: Craft Room  Group Topic: Coping skills  Goal Area(s) Addresses:  Patient will effectively use art as a coping skill. Patient will be able to identify one emotion experienced during group.  Behavioral Response: Attentive  Intervention: Two Faces of Me  Activity: Patients were given a blank face worksheet and instructed to draw or write how they felt when they were admitted to the hospital on one side of the face and draw or write how they wanted to feel when they are d/c on the other side of the face.  Education: LRT educated patients on healthy coping skills.  Education Outcome: In group clarification offered   Clinical Observations/Feedback: Patient drew how he felt when he was admitted to the hospital and how he wanted to feel when he was d/c. Patient contributed to group discussion.  Jacquelynn CreeGreene,Esa Raden M, LRT/CTRS 07/22/2015 10:28 AM
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
## 2015-07-22 NOTE — [facility] Group Notes (Signed)
[facility] Group Notes:  (Nursing/MHT/Case Management/Adjunct)  Date:  07/22/2015  Time:  2:35 PM  Type of Therapy:  Psychoeducational Skills  Participation Level:  Active  Participation Quality:  Inattentive  Affect:  Flat  Cognitive:  Oriented  Insight:  Improving  Engagement in Group:  Engaged  Modes of Intervention:  Discussion and Education  Summary of Progress/Problems:  [name] Love M [name] Love 07/22/2015, 2:35 PM

## 2015-07-22 NOTE — [facility] Group Notes (Signed)
[facility]   07/22/2015 11:54 AM  Participation Quality:   Patient attended and participated in group discussion introducing himself and sharing that his SMART goal is to "see my family". Patient was given a daily workbook on Recovery and ensured patient was informed of LOS, rules for milieu, roles of staff, and ensured patient was aware of their psychiatrist and social worker names.   Mood/Affect:  Blunted  Depression Rating:  7  Anxiety Rating:  5  Thoughts of Suicide:  No Will you contract for safety?   NA  Current AVH:  No  Plan for Discharge/Comments:  Home with mom  Transportation Means: mom will pick up  Supports: mom is support for patient  Lulu RidingIngle, Eulalia Ellerman T, MSW, LCSW

## 2015-07-22 NOTE — Plan of Care (Signed)
Problem: Alteration in thought process Goal: STG-Patient is able to discuss thoughts with staff Outcome: Progressing Patient was able to discuss groups and his coping skills with this Clinical research associatewriter.

## 2015-07-23 NOTE — [facility] Group Notes (Signed)
[facility] LCSW Group Therapy  07/23/2015 2:42 PM  Type of Therapy:  Group Therapy  Participation Level:  Did Not Attend  Summary of Progress/Problems:  CSW called group for a movie, "The Happy Movie" which is a documentary about what truly makes people happy and how to measure happiness. Patient did not attend the afternoon group.   Lolah Coghlan T, MSW, LCSW 07/23/2015, 2:42 PM 

## 2015-07-23 NOTE — Tx Team (Signed)
Interdisciplinary Treatment Plan Update (Adult)  Date:  07/23/2015 Time Reviewed:  5:55 PM  Progress in Treatment: Attending groups: Yes. Participating in groups: Yes. Taking medication as prescribed: Yes. Tolerating medication: Yes. Family/Significant othe contact made: Yes, individual(s) contacted: mother Patient understands diagnosis: Yes. Discussing patient identified problems/goals with staff: Yes. Medical problems stabilized or resolved: Yes. Denies suicidal/homicidal ideation: Yes. Issues/concerns per patient self-inventory: Yes. Other:  New problem(s) identified: No, Describe: NA  Discharge Plan or Barriers: Pt plans to return home and follow up with [facility]   Reason for Continuation of Hospitalization: Hallucinations Medication stabilization  Comments:Answers are vague and concrete. Pt denies having problems with mood, appetite, energy, sleep, or concentration. Denies SI, HI or hallucinations. Staff reports pt appears to be improving, not as odd and bizarre  Per Nursing:   Patient appears brighter this evening. He denies SI/HI and currently denies voices. He attended group. He stated they talked about coping skills in groups. He then discussed his coping skills with this Probation officer. He reported listening to music and playing sports were his coping skills. He denies pain.  Estimated length of stay: 7 days  New goal(s):NA  Review of initial/current patient goals per problem list:  1. Goal(s): Patient will participate in aftercare plan  Met:YES  Target date: at discharge  As evidenced by: Patient will participate within aftercare plan AEB aftercare provider and housing plan at discharge being identified. 07/23/15-Met, Pt will return home and follow up with [facility]  2. Goal (s): Patient will exhibit decreased depressive symptoms and suicidal ideations.  Met:  Target date: at discharge  As evidenced by: Patient will  utilize self rating of depression at 3 or below and demonstrate decreased signs of depression or be deemed stable for discharge by MD. 07/23/15-Goal Progressing-attending and participating in groups 3. Goal (s): Patient will demonstrate decreased symptoms of psychosis.  Met:No  Target date: at discharge  As evidenced by: Patient will not endorse signs of psychosis or be deemed stable for discharge by MD. 07/23/15- Goal progressing, Less bizzare behaviors  Attendees: [name] 5/10/20175:55 PM  Family:   5/10/20175:55 PM  Physician:  Hernandez 5/10/20175:55 PM  Nursing:   Polly Cobia 5/10/20175:55 PM  Case Manager:   5/10/20175:55 PM  Counselor:  Dossie Arbour, LCSW 5/10/20175:55 PM  Other:   5/10/20175:55 PM  Other:   5/10/20175:55 PM  Other:   5/10/20175:55 PM  Other:  5/10/20175:55 PM  Other:  5/10/20175:55 PM  Other:  5/10/20175:55 PM  Other:  5/10/20175:55 PM  Other:  5/10/20175:55 PM  Other:  5/10/20175:55 PM  Other:   5/10/20175:55 PM   Scribe for Treatment Team:   August Saucer, 07/23/2015, 5:55 PM, MSW, LCSW

## 2015-07-23 NOTE — Progress Notes (Signed)
D:  Patient is alert and questionably oriented on the unit this shift.  Patient did not attend groups today.  Patient denies suicidal ideation, homicidal ideation, auditory or visual hallucinations at the present time.  Patient forwards little this shift.  Patient remains in his bed with the blanket over his head this am. A:  Scheduled medications are administered to patient as per MD orders.  Emotional support and encouragement are provided.  Patient is maintained on q.15 minute safety checks.  Patient is informed to notify staff with questions or concerns. R:  No adverse medication reactions are noted.  Patient is cooperative with medication administration and treatment plan today.  Patient is receptive, calm and cooperative on the unit at this time.  Patient interacts minimally with others on the unit this shift.  Patient contracts for safety at this time.  Patient remains safe at this time.

## 2015-07-23 NOTE — [facility] Group Notes (Signed)
[facility] LCSW Group Therapy  07/23/2015 11:07 AM  Type of Therapy:  Group Therapy  Participation Level:  Did Not Attend  Summary of Progress/Problems: Patient was called but did not attend morning group.   Lulu RidingIngle, Vernelle Wisner T, MSW, LCSW 07/23/2015, 11:07 AM

## 2015-07-23 NOTE — [facility] Group Notes (Signed)
[facility] Group Notes:  (Nursing/MHT/Case Management/Adjunct)  Date:  07/23/2015  Time:  3:55 PM  Type of Therapy:  Psychoeducational Skills  Participation Level:  Active  Participation Quality:  Attentive  Affect:  Blunted and Flat  Cognitive:  Appropriate  Insight:  Improving  Engagement in Group:  Limited  Modes of Intervention:  Discussion and Education  Summary of Progress/Problems:  [name] Love 07/23/2015, 3:55 PM

## 2015-07-23 NOTE — Plan of Care (Signed)
Problem: Ineffective individual coping Goal: STG-Increase in ability to manage activities of daily living Outcome: Progressing Patient is able to manage his activities of daily living currently     

## 2015-07-23 NOTE — Plan of Care (Signed)
Problem: Ineffective individual coping Goal: LTG: Patient will report a decrease in negative feelings Outcome: Not Progressing Patient reports his mood is not better today than it was yesterday
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
## 2015-07-24 MED ORDER — PALIPERIDONE ER 6 MG PO TB24: 12.0000 mg | ORAL_TABLET | Freq: Every day | ORAL | Status: DC

## 2015-07-24 MED ORDER — AMANTADINE HCL 100 MG PO CAPS: 100.0000 mg | ORAL_CAPSULE | Freq: Two times a day (BID) | ORAL | Status: DC

## 2015-07-24 NOTE — Progress Notes (Signed)
D: Pt denies SI/HI/AVH, but noted responding to internal stimuli. Pt is pleasant and cooperative, affect flat and sad, thoughts disorganized.  Pt appears less anxious, interacting with peers and staff appropriately.  A: Pt was offered support and encouragement. Pt was given scheduled medications. Pt was encouraged to attend groups. Q 15 minute checks were done for safety.  R:Pt attends groups and interacts well with peers and staff. Pt is taking medication. Pt has no complaints.Pt receptive to treatment and safety maintained on unit.

## 2015-07-24 NOTE — [facility] Group Notes (Signed)
[facility] LCSW Group Therapy  07/24/2015 2:24 PM  Type of Therapy:  Group Therapy  Participation Level:  Minimal  Summary of Progress/Problems: CSW showed a movie Lu DuffelStuart Saves His Family which depicts struggles that many of our group members have in their lives with dysfunctional family crisis such as a relative passing away, Stuart's father an alcoholic and his brother is smoking marijuana daily along with co dependent relationships and family conflict.   Patient attended group and was attentive throughout group but was reserved and reluctant to share during a discussion after the video.  Lulu RidingIngle, Eren Ryser T, MSW, LCSW 07/24/2015, 2:24 PM

## 2015-07-24 NOTE — [facility] Group Notes (Signed)
[facility] Group Notes:  (Nursing/MHT/Case Management/Adjunct)  Date:  07/24/2015  Time:  1:30 AM  Type of Therapy:  Group Therapy  Participation Level:  Active  Participation Quality:  Appropriate  Affect:  Appropriate  Cognitive:  Appropriate  Insight:  Improving  Engagement in Group:  Developing/Improving  Modes of Intervention:  n/a  Summary of Progress/Problems:  [name] Love 07/24/2015, 1:30 AM

## 2015-07-24 NOTE — [facility] Suicide Risk Assessment (Signed)
[facility]   Principal Problem: Schizophrenia, acute undifferentiated [facility](HCC) Discharge Diagnoses:  Patient Active Problem List   Diagnosis Date Noted  . 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 Cannabis use disorder, severe, dependence (HCC) [F12.20] 11/26/2014  . Tobacco use disorder [F17.200] 11/26/2014  . Schizophrenia, acute undifferentiated (HCC) [F20.3] 11/24/2014      Psychiatric Specialty Exam: ROS  Blood pressure 136/79, pulse 95, temperature 98.3 F (36.8 C), temperature source Oral, resp. rate 20, height 5\' 10"  (1.778 m), weight 91.173 kg (201 lb), SpO2 99 %.Body mass index is 28.84 kg/(m^2).                                                       Mental Status Per Nursing Assessment::   On Admission:  NA  Demographic Factors:  Male, Adolescent or young adult and Unemployed  Loss Factors: Financial problems/change in socioeconomic status  Historical Factors: Impulsivity  Risk Reduction Factors:   Sense of responsibility to family, Living with another person, especially a relative and Positive social support  Continued Clinical Symptoms:  Schizophrenia:   Command hallucinatons Less than 22 years old Previous Psychiatric Diagnoses and Treatments  Cognitive Features That Contribute To Risk:  Closed-mindedness    Suicide Risk:  Minimal: No identifiable suicidal ideation.  Patients presenting with no risk factors but with morbid ruminations; may be classified as minimal risk based on the severity of the depressive symptoms  Follow-up Information    Follow up with [facility]. Go on 07/31/2015.   Why:  4:00pm, To begin  ACTT Services, Please reschedule if unable to make appointment.   Contact information:   [address] [place] [address] Phone:[phone] Fax: [phone]       Jimmy FootmanHernandez-Gonzalez,  Fitzhugh Vizcarrondo, MD 07/25/2015, 7:08 AM
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
## 2015-07-24 NOTE — [facility] Group Notes (Signed)
[facility] LCSW Group Therapy  07/24/2015 10:46 AM  Type of Therapy:  Group Therapy  Participation Level:  Did Not Attend  Summary of Progress/Problems: Patient was called to group but did not attend.  [name] Love, MSW, LCSW 07/24/2015, 10:46 AM 

## 2015-07-24 NOTE — [facility] Group Notes (Signed)
[facility] Group Notes:  (Nursing/MHT/Case Management/Adjunct)  Date:  07/24/2015  Time:  4:00 PM  Type of Therapy:  Music Therapy  Participation Level:  Minimal  Participation Quality:  Attentive  Affect:  Appropriate  Cognitive:  Alert  Insight:  Improving  Engagement in Group:  Improving  Modes of Intervention:  Activity  Summary of Progress/Problems:  [name] Love [name] 07/24/2015, 4:00 PM

## 2015-07-24 NOTE — [facility] Group Notes (Signed)
[facility] LCSW Aftercare Discharge Planning Group Note   07/24/2015 10:42 AM  Participation Quality:  Patient was called to group but did not attend.     Harbert Fitterer T, MSW, LCSW  

## 2015-07-24 NOTE — Plan of Care (Signed)
Problem: Alteration in thought process Goal: LTG-Patient behavior demonstrates decreased signs psychosis (Patient behavior demonstrates decreased signs of psychosis to the point the patient is safe to return home and continue treatment in an outpatient setting.)  Outcome: Progressing Less psychosis noted.      

## 2015-07-24 NOTE — Plan of Care (Signed)
Problem: Alteration in thought process Goal: STG-Patient does not respond to command hallucinations Outcome: Progressing Denies hearing voices,or visual hallucinations
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
## 2015-07-25 MED ORDER — PALIPERIDONE PALMITATE 156 MG/ML IM SUSP
156.0000 mg | Freq: Once | INTRAMUSCULAR | Status: AC
Start: 2015-07-25 — End: 2015-07-25
  Administered 2015-07-25: 156 mg via INTRAMUSCULAR
  Filled 2015-07-25: qty 1

## 2015-07-25 MED ORDER — PALIPERIDONE PALMITATE 234 MG/1.5ML IM SUSP: 234.0000 mg | Freq: Once | INTRAMUSCULAR | Status: DC

## 2015-07-25 NOTE — Progress Notes (Signed)
D: Pt denies SI/HI/AVH. Pt is pleasant and cooperative., thoughts are organized and logical. No S/S of psychosis noted.  Pt appears less anxious and he is interacting with peers and staff appropriately.  A: Pt was offered support and encouragement. Pt was given scheduled medications. Pt was encouraged to attend groups. Q 15 minute checks were done for safety.  R:Pt attends groups and interacts well with peers and staff. Pt is taking medication. Pt has no complaints.Pt receptive to treatment and safety maintained on unit.

## 2015-07-25 NOTE — [facility] Suicide Risk Assessment (Signed)
[facility] INPATIENT:  Family/Significant Other Suicide Prevention Education  Suicide Prevention Education:  Education Completed; [name] Love [phone] (Mother) has been identified by the patient as the family member/significant other with whom the patient will be residing, and identified as the person(s) who will aid the patient in the event of a mental health crisis (suicidal ideations/suicide attempt).  With written consent from the patient, the family member/significant other has been provided the following suicide prevention education, prior to the and/or following the discharge of the patient.  The suicide prevention education provided includes the following:  Suicide risk factors  Suicide prevention and interventions  National Suicide Hotline telephone number  [facility] assessment telephone number  [facility] Emergency Assistance 911  [facility] and/or Residential Mobile Crisis Unit telephone number  Request made of family/significant other to:  Remove weapons (e.g., guns, rifles, knives), all items previously/currently identified as safety concern.    Remove drugs/medications (over-the-counter, prescriptions, illicit drugs), all items previously/currently identified as a safety concern.  The family member/significant other verbalizes understanding of the suicide prevention education information provided.  The family member/significant other agrees to remove the items of safety concern listed above.  Sempra EnergyCandace L Danyale Ridinger MSW, LCSWA  07/25/2015, 9:13 AM

## 2015-07-25 NOTE — Progress Notes (Signed)
  [facility] Dis[facility] :  Will you be returning to the same living situation after discharge:  Yes,  home  At discharge, do you have transportation home?: Yes,  mother  Do you have the ability to pay for your medications: Yes,  mental health   Release of information consent forms completed and in the chart;  Patient's signature needed at discharge.  Patient to Follow up at: Follow-up Information    Follow up with [facility]. Go on 07/31/2015.   Why:  Your hospital follow up appointment is on Thursday, May 18th at 4:00pm, to begin  [employer]. Please reschedule if unable to make appointment.   Contact information:   [address] [place] [address] Phone:[phone] Fax: [phone]      Next level of care provider has access to [facility] Link:no  Safety Planning and Suicide Prevention discussed: Yes,  with patient and mother   Have you used any form of tobacco in the last 30 days? (Cigarettes, Smokeless Tobacco, Cigars, and/or Pipes): Yes  Has patient been referred to the Quitline?: Patient refused referral  Patient has been referred for addiction treatment: Pt. refused referral  Sempra EnergyCandace L Voyd Groft MSW, LCSWA  07/25/2015, 9:12 AM

## 2015-07-25 NOTE — Progress Notes (Signed)
Patient's discharge instructions and medications discussed with patient, he verbalized understanding. Patient received his belongings back and had no complaints. Patient was escorted out to the waiting room by staff where his mother was waiting for him. Patient very excited to see mother and to leave. He had no concerns at the time of d/c.

## 2015-07-25 NOTE — [facility] Group Notes (Signed)
[facility] LCSW Group Therapy  07/25/2015 10:58 AM  Type of Therapy:  Group Therapy  Participation Level:  Minimal  Participation Quality:  Attentive  Affect:  Blunted  Cognitive:  Disorganized  Insight:  Limited  Engagement in Therapy:  Limited  Modes of Intervention:  Socialization and Support  Summary of Progress/Problems: Summary of Progress/Problems: Patient attended and participated minimally but during introductions introduced himself and shared during an introductory exercise 3 things he would take if he were stranded [place] would be "a cell phone, shoes, and a hoodie". Patient left group early after 20 minutes and did not return.   Lulu RidingIngle, Treylan Mcclintock T, MSW, LCSW 07/25/2015, 10:58 AM

## 2015-07-25 NOTE — Plan of Care (Signed)
Problem: Alteration in thought process Goal: LTG-Patient behavior demonstrates decreased signs psychosis (Patient behavior demonstrates decreased signs of psychosis to the point the patient is safe to return home and continue treatment in an outpatient setting.)  Outcome: Not Progressing Decreased psychosis noted.

## 2015-07-25 NOTE — Tx Team (Signed)
Interdisciplinary Treatment Plan Update (Adult)  Date:  07/25/2015 Time Reviewed:  12:36 PM  Progress in Treatment: Attending groups: Yes. Participating in groups:  Yes. Taking medication as prescribed:  Yes. Tolerating medication:  Yes. Family/Significant othe contact made:  Yes, individual(s) contacted:  mother Patient understands diagnosis:  Yes. Discussing patient identified problems/goals with staff:  Yes. Medical problems stabilized or resolved:  Yes. Denies suicidal/homicidal ideation: Yes. Issues/concerns per patient self-inventory:  Yes. Other:  New problem(s) identified: No, Describe:  NA  Discharge Plan or Barriers: Pt plans to return home and follow up with outpatient.    Reason for Continuation of Hospitalization: Hallucinations Medication stabilization  Comments: Answers are vague and concrete. Pt denies having problems with mood, appetite, energy, sleep, or concentration. Denies SI, HI or hallucinations. Staff reports pt appears to be improving, not as odd and bizarre  Estimated length of stay: Pt will discharge today.   New goal(s): NA  Review of initial/current patient goals per problem list:   1.  Goal(s): Patient will participate in aftercare plan * Met:  * Target date: at discharge * As evidenced by: Patient will participate within aftercare plan AEB aftercare provider and housing plan at discharge being identified.  2.  Goal (s): Patient will demonstrate decreased symptoms of psychosis. * Met: No  *  Target date: at discharge * As evidenced by: Patient will not endorse signs of psychosis or be deemed stable for discharge by MD.   Attendees: [name] 5/12/201712:36 PM  Family:   5/12/201712:36 PM  Physician:  Dr. Jerilee Hoh   5/12/201712:36 PM  Nurse: Junita Push, RN   5/12/201712:36 PM  Case Manager:   5/12/201712:36 PM  Counselor:   5/12/201712:36 PM  Other:  [place] 5/12/201712:36 PM  Other:  5/12/201712:36 PM  Other:    5/12/201712:36 PM  Other:  5/12/201712:36 PM  Other:  5/12/201712:36 PM  Other:  5/12/201712:36 PM  Other:  5/12/201712:36 PM  Other:  5/12/201712:36 PM  Other:  5/12/201712:36 PM  Other:   5/12/201712:36 PM   Scribe for Treatment Team:   Campbell Stall Joslyne Marshburn,MSW, [place]  07/25/2015, 12:36 PM

## 2015-08-06 ENCOUNTER — Emergency Department (HOSPITAL_COMMUNITY)
Admission: EM | Admit: 2015-08-06 | Discharge: 2015-08-07 | Disposition: A | Discharge status: Home / Self Care | Payer: Medicaid Other | Attending: Emergency Medicine | Admitting: Emergency Medicine

## 2015-08-06 ENCOUNTER — Encounter (HOSPITAL_COMMUNITY): Payer: Self-pay | Admitting: Emergency Medicine

## 2015-08-06 DIAGNOSIS — F17210 Nicotine dependence, cigarettes, uncomplicated: Secondary | ICD-10-CM | POA: Diagnosis not present

## 2015-08-06 DIAGNOSIS — Z91010 Allergy to peanuts: Secondary | ICD-10-CM | POA: Diagnosis not present

## 2015-08-06 DIAGNOSIS — F209 Schizophrenia, unspecified: Secondary | ICD-10-CM | POA: Diagnosis not present

## 2015-08-06 DIAGNOSIS — Z79899 Other long term (current) drug therapy: Secondary | ICD-10-CM | POA: Diagnosis not present

## 2015-08-06 DIAGNOSIS — F329 Major depressive disorder, single episode, unspecified: Secondary | ICD-10-CM | POA: Insufficient documentation

## 2015-08-06 DIAGNOSIS — F203 Undifferentiated schizophrenia: Secondary | ICD-10-CM | POA: Diagnosis present

## 2015-08-06 DIAGNOSIS — F32A Depression, unspecified: Secondary | ICD-10-CM

## 2015-08-06 DIAGNOSIS — R45851 Suicidal ideations: Secondary | ICD-10-CM | POA: Diagnosis present

## 2015-08-06 DIAGNOSIS — Z91040 Latex allergy status: Secondary | ICD-10-CM | POA: Insufficient documentation

## 2015-08-06 LAB — RAPID URINE DRUG SCREEN, HOSP PERFORMED
Amphetamines: NOT DETECTED
BARBITURATES: NOT DETECTED
BENZODIAZEPINES: NOT DETECTED
COCAINE: NOT DETECTED
Opiates: NOT DETECTED
Tetrahydrocannabinol: NOT DETECTED

## 2015-08-06 LAB — COMPREHENSIVE METABOLIC PANEL
ALT: 21 U/L (ref 17–63)
ANION GAP: 7 (ref 5–15)
AST: 30 U/L (ref 15–41)
Albumin: 4.6 g/dL (ref 3.5–5.0)
Alkaline Phosphatase: 77 U/L (ref 38–126)
BILIRUBIN TOTAL: 0.1 mg/dL — AB (ref 0.3–1.2)
BUN: 9 mg/dL (ref 6–20)
CO2: 26 mmol/L (ref 22–32)
Calcium: 9.3 mg/dL (ref 8.9–10.3)
Chloride: 105 mmol/L (ref 101–111)
Creatinine, Ser: 1.02 mg/dL (ref 0.61–1.24)
GFR calc Af Amer: >60 mL/min (ref >60)
Glucose, Bld: 101 mg/dL — ABNORMAL HIGH (ref 65–99)
POTASSIUM: 3.7 mmol/L (ref 3.5–5.1)
Sodium: 138 mmol/L (ref 135–145)
TOTAL PROTEIN: 7.5 g/dL (ref 6.5–8.1)

## 2015-08-06 LAB — CBC
HCT: 39.4 % (ref 39.0–52.0)
Hemoglobin: 13.2 g/dL (ref 13.0–17.0)
MCH: 29.5 pg (ref 26.0–34.0)
MCHC: 33.5 g/dL (ref 30.0–36.0)
MCV: 88.1 fL (ref 78.0–100.0)
PLATELETS: 195 10*3/uL (ref 150–400)
RBC: 4.47 MIL/uL (ref 4.22–5.81)
RDW: 13 % (ref 11.5–15.5)
WBC: 3.7 10*3/uL — AB (ref 4.0–10.5)

## 2015-08-06 LAB — ETHANOL

## 2015-08-06 LAB — SALICYLATE LEVEL: Salicylate Lvl: <4 mg/dL (ref 2.8–30.0)

## 2015-08-06 LAB — ACETAMINOPHEN LEVEL

## 2015-08-06 MED ORDER — LORAZEPAM 1 MG PO TABS: 1.0000 mg | ORAL_TABLET | Freq: Three times a day (TID) | ORAL | Status: DC | PRN

## 2015-08-06 NOTE — ED Notes (Signed)
Patient called his aunt, but  had a demanding tone when asked to use the phone. Patient discussed with aunt that he was in the hospital again.

## 2015-08-06 NOTE — BH Assessment (Signed)
[facility] Assessment Progress Note  Per Carolanne GrumblingGerald Taylor, MD, this pt requires psychiatric hospitalization at this time. The following facilities have been contacted to seek placement for this pt, with results as noted:   At capacity[facility]  [facility] [place] [facility]   Doylene Canninghomas [place], [place] Triage Specialist

## 2015-08-06 NOTE — ED Notes (Signed)
Patient noted in room. No complaints, stable, in no acute distress and denies current SI / HI, A./ V H, depression, anxiety and pain at this time. Q15 minute rounds and monitoring via security cameras continue for safety.
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
## 2015-08-06 NOTE — Progress Notes (Signed)
Attempted to secure placement at the following facilities:   Faxed to: [facility]- Kathy [facility]- [facility]- [facility]- [place]  No Beds: [place] [facility]- Christie [facility]- [facility]- [facility] [place] [facility]- [facility]- [facility]- left message [facility]   Trystan Akhtar, MSW, LCSW, LCAS [facility] Triage Specialist [phone] 
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
## 2015-08-06 NOTE — ED Notes (Signed)
Informed patient that his mother called while he was sleeping, gave patient phone number that mother gave.

## 2015-08-06 NOTE — ED Notes (Signed)
Meal given
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
## 2015-08-06 NOTE — ED Notes (Signed)
Meal tray given 

## 2015-08-06 NOTE — ED Notes (Signed)
Bed: [facility]BH35 Expected date:  Expected time:  Means of arrival:  Comments: Triage 4 

## 2015-08-06 NOTE — ED Notes (Signed)
While drawing patient's blood, patient stated, "My mother disrespects me. She won't buy me cigarettes." "I don't like it when she disrespects me."

## 2015-08-07 ENCOUNTER — Encounter (HOSPITAL_COMMUNITY): Payer: Self-pay

## 2015-08-07 ENCOUNTER — Inpatient Hospital Stay (HOSPITAL_COMMUNITY)
Admission: AD | Admit: 2015-08-07 | Discharge: 2015-08-13 | DRG: 885 | Disposition: A | Discharge status: Home / Self Care | Payer: No Typology Code available for payment source | Attending: Psychiatry | Admitting: Psychiatry

## 2015-08-07 DIAGNOSIS — F1211 Cannabis abuse, in remission: Secondary | ICD-10-CM | POA: Diagnosis present

## 2015-08-07 DIAGNOSIS — F209 Schizophrenia, unspecified: Secondary | ICD-10-CM | POA: Diagnosis present

## 2015-08-07 DIAGNOSIS — F17210 Nicotine dependence, cigarettes, uncomplicated: Secondary | ICD-10-CM | POA: Diagnosis present

## 2015-08-07 DIAGNOSIS — R45851 Suicidal ideations: Secondary | ICD-10-CM | POA: Diagnosis present

## 2015-08-07 DIAGNOSIS — F203 Undifferentiated schizophrenia: Secondary | ICD-10-CM | POA: Diagnosis present

## 2015-08-07 DIAGNOSIS — F329 Major depressive disorder, single episode, unspecified: Secondary | ICD-10-CM | POA: Insufficient documentation

## 2015-08-07 DIAGNOSIS — R45850 Homicidal ideations: Secondary | ICD-10-CM | POA: Diagnosis present

## 2015-08-07 DIAGNOSIS — F1290 Cannabis use, unspecified, uncomplicated: Secondary | ICD-10-CM | POA: Diagnosis not present

## 2015-08-07 DIAGNOSIS — F32A Depression, unspecified: Secondary | ICD-10-CM | POA: Insufficient documentation

## 2015-08-07 MED ORDER — ACETAMINOPHEN 325 MG PO TABS
650.0000 mg | ORAL_TABLET | Freq: Four times a day (QID) | ORAL | Status: DC | PRN
  Administered 2015-08-07 – 2015-08-10 (×3): 650 mg via ORAL
  Filled 2015-08-07 (×3): qty 2

## 2015-08-07 MED ORDER — BENZTROPINE MESYLATE 1 MG PO TABS
1.0000 mg | ORAL_TABLET | Freq: Two times a day (BID) | ORAL | Status: DC
  Administered 2015-08-07: 1 mg via ORAL
  Filled 2015-08-07: qty 1

## 2015-08-07 MED ORDER — ACETAMINOPHEN 325 MG PO TABS
650.0000 mg | ORAL_TABLET | Freq: Once | ORAL | Status: AC
  Administered 2015-08-07: 650 mg via ORAL
  Filled 2015-08-07: qty 2

## 2015-08-07 MED ORDER — AMANTADINE HCL 100 MG PO CAPS: 100.0000 mg | ORAL_CAPSULE | Freq: Two times a day (BID) | ORAL | Status: DC

## 2015-08-07 MED ORDER — PALIPERIDONE PALMITATE 234 MG/1.5ML IM SUSP: 234.0000 mg | Freq: Once | INTRAMUSCULAR | Status: DC

## 2015-08-07 MED ORDER — TRAZODONE HCL 100 MG PO TABS
100.0000 mg | ORAL_TABLET | Freq: Every evening | ORAL | Status: DC | PRN
Start: 2015-08-07 — End: 2015-08-07

## 2015-08-07 MED ORDER — PALIPERIDONE ER 6 MG PO TB24: 12.0000 mg | ORAL_TABLET | Freq: Every day | ORAL | Status: DC

## 2015-08-07 MED ORDER — LORAZEPAM 1 MG PO TABS
1.0000 mg | ORAL_TABLET | Freq: Three times a day (TID) | ORAL | Status: DC | PRN
  Administered 2015-08-09 – 2015-08-11 (×3): 1 mg via ORAL
  Filled 2015-08-07 (×3): qty 1

## 2015-08-07 MED ORDER — BENZTROPINE MESYLATE 1 MG PO TABS
1.0000 mg | ORAL_TABLET | Freq: Two times a day (BID) | ORAL | Status: DC
  Administered 2015-08-07 – 2015-08-08 (×2): 1 mg via ORAL
  Filled 2015-08-07 (×5): qty 1

## 2015-08-07 MED ORDER — TRAZODONE HCL 100 MG PO TABS
100.0000 mg | ORAL_TABLET | Freq: Every evening | ORAL | Status: DC | PRN
  Administered 2015-08-07 – 2015-08-12 (×6): 100 mg via ORAL
  Filled 2015-08-07 (×5): qty 1
  Filled 2015-08-07: qty 7
  Filled 2015-08-07: qty 1

## 2015-08-07 MED ORDER — PALIPERIDONE ER 6 MG PO TB24
9.0000 mg | ORAL_TABLET | Freq: Every day | ORAL | Status: DC
  Filled 2015-08-07: qty 1

## 2015-08-07 MED ORDER — PALIPERIDONE ER 6 MG PO TB24
9.0000 mg | ORAL_TABLET | Freq: Every day | ORAL | Status: DC
  Administered 2015-08-07 – 2015-08-12 (×6): 9 mg via ORAL
  Filled 2015-08-07 (×8): qty 1

## 2015-08-07 MED ORDER — CARBAMAZEPINE 200 MG PO TABS
200.0000 mg | ORAL_TABLET | Freq: Two times a day (BID) | ORAL | Status: DC
  Filled 2015-08-07 (×2): qty 1

## 2015-08-07 MED ORDER — CARBAMAZEPINE 200 MG PO TABS
200.0000 mg | ORAL_TABLET | Freq: Two times a day (BID) | ORAL | Status: DC
  Administered 2015-08-07 – 2015-08-13 (×12): 200 mg via ORAL
  Filled 2015-08-07 (×4): qty 1
  Filled 2015-08-07: qty 14
  Filled 2015-08-07 (×7): qty 1
  Filled 2015-08-07: qty 14
  Filled 2015-08-07 (×4): qty 1

## 2015-08-07 MED ORDER — NICOTINE 14 MG/24HR TD PT24
14.0000 mg | MEDICATED_PATCH | Freq: Every day | TRANSDERMAL | Status: DC
  Administered 2015-08-07 – 2015-08-13 (×6): 14 mg via TRANSDERMAL
  Filled 2015-08-07 (×9): qty 1

## 2015-08-07 NOTE — BH Assessment (Signed)
[facility] Assessment Progress Note  Per Thedore MinsMojeed Akintayo, MD, this pt requires psychiatric hospitalization at this time.  [name], RN, [id number]; they will be ready to receive pt at 15:00.  Pt has signed Voluntary Admission and Consent for Treatment, as well as Consent to Release Information, and signed forms have [facility].  Pt's nurse, Morrie Sheldonshley, has been notified, and agrees to send original paperwork along with pt via [facility], and to call report to [phone].  Doylene Canninghomas Geral Tuch, MA Triage Specialist [phone]
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
## 2015-08-07 NOTE — Tx Team (Signed)
Initial Interdisciplinary Treatment Plan   PATIENT STRESSORS: Marital or family conflict   PATIENT STRENGTHS: Licensed conveyancerCommunication skills Financial means Physical Health Supportive family/friends   PROBLEM LIST: Problem List/Patient Goals Date to be addressed Date deferred Reason deferred Estimated date of resolution  "Listening" 08/07/2015     "My Health" 08/07/2015     Psychosis 08/07/2015     Suicidal ideation 08/07/2015                                    DISCHARGE CRITERIA:  Ability to meet basic life and health needs Adequate post-discharge living arrangements Improved stabilization in mood, thinking, and/or behavior Motivation to continue treatment in a less acute level of care Need for constant or close observation no longer present Verbal commitment to aftercare and medication compliance  PRELIMINARY DISCHARGE PLAN: Outpatient therapy Return to previous living arrangement  PATIENT/FAMIILY INVOLVEMENT: This treatment plan has been presented to and reviewed with the patient, [name] Love.  The patient and family have been given the opportunity to ask questions and make suggestions.  Lindajo RoyalDaniel P Lateesha Bezold 08/07/2015, 5:47 PM

## 2015-08-07 NOTE — Progress Notes (Signed)
Pt attended karaoke group this evening.  
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
## 2015-08-07 NOTE — ED Notes (Signed)
This nurse made morning rounds with psychiatry team this morning. Pt presents guarded, soft spoken. Pt endorsing SI. Reports  He is "not feeling well" Pt endorsing AVH. "I see visions of people." Reports he is at the hospital d/t "smoking cigarettes." Special checks q 15 mins in place for safety. Video monitoring in place.

## 2015-08-07 NOTE — ED Notes (Signed)
[facility] on unit for transfer. [facility] order. Pt signed for personal belongings and personal belongings given to transport service for transfer. Pt signed e-signature.  Ambulatory off unit with [facility] transport.

## 2015-08-08 ENCOUNTER — Encounter (HOSPITAL_COMMUNITY): Payer: Self-pay | Admitting: Psychiatry

## 2015-08-08 DIAGNOSIS — F1290 Cannabis use, unspecified, uncomplicated: Secondary | ICD-10-CM

## 2015-08-08 DIAGNOSIS — F203 Undifferentiated schizophrenia: Principal | ICD-10-CM

## 2015-08-08 DIAGNOSIS — R45850 Homicidal ideations: Secondary | ICD-10-CM

## 2015-08-08 DIAGNOSIS — F1211 Cannabis abuse, in remission: Secondary | ICD-10-CM | POA: Diagnosis present

## 2015-08-08 DIAGNOSIS — R45851 Suicidal ideations: Secondary | ICD-10-CM

## 2015-08-08 MED ORDER — AMANTADINE HCL 100 MG PO CAPS
100.0000 mg | ORAL_CAPSULE | Freq: Two times a day (BID) | ORAL | Status: DC
  Administered 2015-08-08 – 2015-08-13 (×10): 100 mg via ORAL
  Filled 2015-08-08: qty 1
  Filled 2015-08-08: qty 14
  Filled 2015-08-08 (×7): qty 1
  Filled 2015-08-08: qty 14
  Filled 2015-08-08 (×4): qty 1
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
## 2015-08-08 NOTE — [facility] Group Notes (Signed)
[facility] LCSW Group Therapy 08/08/2015  1:15 pm  Type of Therapy: Group Therapy Participation Level: Minimal  Participation Quality: Limited  Affect: Lethargic, depressed, flat  Cognitive: Alert and Oriented  Insight: Developing/Improving and Engaged  Engagement in Therapy: Developing/Improving and Engaged  Modes of Intervention: Clarification, Confrontation, Discussion, Education, Exploration,  Limit-setting, Orientation, Problem-solving, Rapport Building, Reality Testing, Socialization and Support  Summary of Progress/Problems: Pt participated minimally in conversation related to hope despite CSW encouragement.  Sue Mcalexander, LCSW Clinical Social Worker [facility] [phone]        

## 2015-08-08 NOTE — Progress Notes (Signed)
DAR NOTE: Patient presents with flat affect and depressed mood.  Reports suicidal thoughts buts contracts for safety.  Reports auditory and visual hallucinations.  Rates depression at 3, hopelessness at 4, and anxiety at 5.  Maintained on routine safety checks.  Medications given as prescribed.  Support and encouragement offered as needed.  Attended group and participated.  States goal for today is "working on getting medications."  Patient remained isolative to his room.
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
## 2015-08-08 NOTE — Progress Notes (Signed)
[facility] Group Notes:  (Nursing/MHT/Case Management/Adjunct)  Date:  08/08/2015  Time:  9:07 PM  Type of Therapy:  Psychoeducational Skills  Participation Level:  Active  Participation Quality:  Attentive  Affect:  Depressed  Cognitive:  Lacking  Insight:  Limited  Engagement in Group:  Resistant  Modes of Intervention:  Education  Summary of Progress/Problems: The patient stated that he had an "awesome" day because he attended the groups and went outside to play basketball. The patient also stated that he is not fond of taking medication and sees no difference between taking his medication versus being noncompliant. His coping skill (theme of the day) will be to keep his mind occupied and drink more water.   Hazle CocaGOODMAN, Dillie Burandt S 08/08/2015, 9:07 PM

## 2015-08-08 NOTE — Progress Notes (Signed)
Patient ID: [name] Love, male   DOB: Jul 21, 1995, 20 y.o.   MRN: [id number] D: Patient in dayroom on approach. Pt is calm and cooperative. Pt reports hearing voices of people and singing. Pt endorses passive suicidal without a plan. Denies HI/AVH and pain.No behavioral issues noted.  A: Support and encouragement offered as needed. Medications administered as prescribed.  R: Patient safe on unit. Will continue to monitor patient for safety and stability.

## 2015-08-08 NOTE — Plan of Care (Signed)
Problem: Education: Goal: Will be free of psychotic symptoms Outcome: Progressing Pt denies AVH but appears to be responding to internal stimuli

## 2015-08-08 NOTE — [facility] Suicide Risk Assessment (Signed)
[facility]   Nursing information obtained from:    Demographic factors:    Current Mental Status:    Loss Factors:    Historical Factors:    Risk Reduction Factors:     Total Time spent with patient: 30 minutes Principal Problem: Schizophrenia, acute undifferentiated (HCC) Diagnosis:   Patient Active Problem List   Diagnosis Date Noted  . 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 Schizophrenia, acute undifferentiated (HCC) [F20.3] 11/24/2014   Subjective Data: Please see H&P.   Continued Clinical Symptoms:  Alcohol Use Disorder Identification Test Final Score (AUDIT): 1 The "Alcohol Use Disorders Identification Test", Guidelines for Use in Primary Care, Second Edition.  World Science writerHealth Organization [facility](WHO). Score between 0-7:  no or low risk or alcohol related problems. Score between 8-15:  moderate risk of alcohol related problems. Score between 16-19:  high risk of alcohol related problems. Score 20 or above:  warrants further diagnostic evaluation for alcohol dependence and treatment.   CLINICAL FACTORS:   Previous Psychiatric Diagnoses and Treatments   Musculoskeletal: Strength & Muscle Tone: within normal limits Gait & Station: normal Patient leans: N/A  Psychiatric Specialty Exam: Physical Exam  Review of Systems  Psychiatric/Behavioral: Positive for depression, suicidal ideas and substance abuse. The patient is nervous/anxious.   All other systems reviewed and are negative.   Blood pressure 141/72, pulse 105, temperature 98.1 F (36.7 C), temperature source Oral, resp. rate 24, height 5\' 10"  (1.778 m), weight 92.987 kg (205 lb).Body mass index is 29.41 kg/(m^2).                          Please see H&P.                                 COGNITIVE FEATURES THAT CONTRIBUTE TO RISK:  Closed-mindedness, Polarized thinking and Thought constriction  (tunnel vision)    SUICIDE RISK:   Moderate:  Frequent suicidal ideation with limited intensity, and duration, some specificity in terms of plans, no associated intent, good self-control, limited dysphoria/symptomatology, some risk factors present, and identifiable protective factors, including available and accessible social support.  PLAN OF CARE: Please see H&P.   I certify that inpatient services furnished can reasonably be expected to improve the patient's condition.   Jaquesha Boroff, MD 08/08/2015, 11:02 AM
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
## 2015-08-08 NOTE — [facility] Group Notes (Signed)
[facility]  08/08/2015 8:45 AM  Participation Quality: Alert, Appropriate and Oriented  Mood/Affect: Appropriate  Depression Rating: "Yes I have some"  Anxiety Rating: "Yes I have some"  Thoughts of Suicide: Pt denies SI/HI  Will you contract for safety? Yes  Current AVH: Pt denies   Plan for Discharge/Comments: Pt attended discharge planning group and actively participated in group. CSW discussed suicide prevention education with the group and encouraged them to discuss discharge planning and any relevant barriers. Pt observed to have vague speech but is pleasant and cooperative in participation.   Transportation Means: Pt reports access to transportation  Supports: No supports mentioned at this time  Chad CordialLauren Carter, LCSWA 08/08/2015 9:58 AM

## 2015-08-09 NOTE — Progress Notes (Signed)
[facility] Group Notes:  (Nursing/MHT/Case Management/Adjunct)  Date:  08/09/2015  Time:  8:30 PM  Type of Therapy:  Psychoeducational Skills  Participation Level:  Minimal  Participation Quality:  Attentive  Affect:  Blunted  Cognitive:  Appropriate  Insight:  Improving  Engagement in Group:  Limited  Modes of Intervention:  Education  Summary of Progress/Problems: The patient expressed in group this evening that he took all of his medication today and had a better appetite as well. As for the theme of the day, his relapse prevention will include "staying on my feet" and keeping himself busy.   Stefana Lodico S 08/09/2015, 8:30 PM

## 2015-08-09 NOTE — Progress Notes (Signed)
[facility] MD Progress Note  08/09/2015 6:01 PM [name] Love  MRN:  [id number] Subjective:  Patient reports " I am good" Objective:[name] Love is awake, alert and oriented to person and place. Patient reports he is unsure why he is in this place. Seen talking on the phone.  Denies suicidal or homicidal ideation. Denies auditory or visual hallucination and does not appear to be responding to internal stimuli during this assessment. Patient is guarded but pleasant. Patient reports he is medication compliant and is tolerating medications well. States his depression 2/10. Reports good appetite and resting well. Support, encouragement and reassurance was provided.   Principal Problem: Schizophrenia, acute undifferentiated (HCC) Diagnosis:   Patient Active Problem List   Diagnosis Date Noted  . 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 Schizophrenia, acute (HCC)    History reviewed. No pertinent past surgical history. Family History:  Family History  Problem Relation Age of Onset  . Mental illness Cousin    Family Psychiatric  History: See Above Social History:  History  Alcohol Use  . Yes    Comment: "one time a month."     History  Drug Use No    Comment: per mother patient uses THC; patient denies THC; only admits to alcohol use    Social History   Social History  . Marital Status: Single    Spouse Name: N/A  . Number of Children: N/A  . Years of Education: N/A   Social History Main Topics  . Smoking status: Current Every Day Smoker -- 0.25 packs/day for 4 years    Types: Cigarettes  . Smokeless tobacco: Never Used  . Alcohol Use: Yes     Comment: "one time a month."  . Drug Use: No     Comment: per  mother patient uses THC; patient denies THC; only admits to alcohol use  . Sexual Activity: Not Currently   Other Topics Concern  . None   Social History Narrative   Additional Social History:    Pain Medications: SEE MAR Prescriptions: SEE MAR History of alcohol / drug use?: Yes Longest period of sobriety (when/how long): N/A Name of Substance 1: THC 1 - Age of First Use: 14 1 - Amount (size/oz): unsure 1 - Frequency: once 1 - Duration: only once 1 - Last Use / Amount: unsure                  Sleep: Fair  Appetite:  Poor  Current Medications: Current Facility-Administered Medications  Medication Dose Route Frequency Provider Last Rate Last Dose  . acetaminophen (TYLENOL) tablet 650 mg  650 mg Oral Q6H PRN Craige Cotta, MD   650 mg at 08/07/15 1937  . amantadine (SYMMETREL) capsule 100 mg  100 mg Oral BID Jomarie Longs, MD   100 mg at 08/09/15 1706  . carbamazepine (TEGRETOL) tablet 200 mg  200 mg Oral BID PC Earney Navy, NP   200 mg at 08/09/15 1706  . LORazepam (ATIVAN) tablet 1 mg  1 mg Oral Q8H PRN Earney Navy, NP      . nicotine (NICODERM CQ - dosed in mg/24 hours) patch 14 mg  14 mg Transdermal Daily Jomarie Longs, MD   14 mg at 08/09/15 0935  . [  START ON 08/18/2015] paliperidone (INVEGA SUSTENNA) injection 234 mg  234 mg Intramuscular Once Earney Navy, NP      . paliperidone (INVEGA) 24 hr tablet 9 mg  9 mg Oral QHS Earney Navy, NP   9 mg at 08/08/15 2144  . traZODone (DESYREL) tablet 100 mg  100 mg Oral QHS PRN Earney Navy, NP   100 mg at 08/08/15 2144    Lab Results: No results found for this or any previous visit (from the past 48 hour(s)).  Blood Alcohol level:  Lab Results  Component Value Date   ETH <5 08/06/2015   ETH <5 07/08/2015    Physical Findings: AIMS: Facial and Oral Movements Muscles of Facial Expression: None, normal Lips and Perioral Area: None, normal Jaw: None, normal Tongue: None,  normal,Extremity Movements Upper (arms, wrists, hands, fingers): None, normal Lower (legs, knees, ankles, toes): None, normal, Trunk Movements Neck, shoulders, hips: None, normal, Overall Severity Severity of abnormal movements (highest score from questions above): None, normal Incapacitation due to abnormal movements: None, normal Patient's awareness of abnormal movements (rate only patient's report): No Awareness, Dental Status Current problems with teeth and/or dentures?: No Does patient usually wear dentures?: No  CIWA:    COWS:     Musculoskeletal: Strength & Muscle Tone: within normal limits Gait & Station: normal Patient leans: N/A  Psychiatric Specialty Exam: Physical Exam  Nursing note and vitals reviewed. Constitutional: He is oriented to person, place, and time. He appears well-developed.  Cardiovascular: Normal rate.   Neurological: He is oriented to person, place, and time.  Psychiatric: He has a normal mood and affect. His behavior is normal.    Review of Systems  Psychiatric/Behavioral: Positive for hallucinations. Negative for depression. The patient is nervous/anxious.   All other systems reviewed and are negative.   Blood pressure 96/47, pulse 130, temperature 97.7 F (36.5 C), temperature source Oral, resp. rate 18, height  (1.778 m), weight 92.987 kg (205 lb).Body mass index is 29.41 kg/(m^2).  General Appearance: Casual  Eye Contact:  Good  Speech:  Clear and Coherent  Volume:  Normal  Mood:  Anxious and Depressed  Affect:  Blunt  Thought Process:  Coherent  Orientation:  Other:  Person and place  Thought Content:  Rumination  Suicidal Thoughts:  No  Homicidal Thoughts:  No  Memory:  Immediate;   Fair Recent;   Poor Remote;   Poor  Judgement:  Poor  Insight:  Lacking  Psychomotor Activity:  Restlessness  Concentration:  Concentration: Fair  Recall:  [employer] of Knowledge:  Fair  Language:  Good  Akathisia:  No  Handed:  Right  AIMS  (if indicated):     Assets:  Desire for Improvement Resilience  ADL's:  Intact  Cognition:  WNL  Sleep:  Number of Hours: 6.75     I agree with current treatment plan on 08/09/2015, Patient seen face-to-face for psychiatric evaluation follow-up, chart reviewed. Reviewed the information documented and agree with the treatment plan.  Treatment Plan Summary: Daily contact with patient to assess and evaluate symptoms and progress in treatment and Medication management Patient will benefit from inpatient treatment and stabilization.  Estimated length of stay is 5-7 days.  Reviewed past medical records,treatment plan.  Will continue Invega 9 mg po qhs for psychosis . Will continue Invega sustenna as scheduled - next dose 08/18/15. Will start Tegretol 200 mg po bid for mood lability/aggression. Tegretol level in 3 days. Will make available PRN  medications as per agitation protocol. Will continue to monitor vitals ,medication compliance and treatment side effects while patient is here.  Will monitor for medical issues as well as call consult as needed.  Reviewed labs- lipid panel ( 07/15/15) - wnl , Hba1c- ( 07/15/15) - wnl , cbc- wnl, cmp - wnl , UDS - negative ,will order EKG for qtc. CSW will start working on disposition.  Patient to participate in therapeutic milieu .   Oneta Rackanika N Lewis, NP 08/09/2015, 6:01 PM Agree with NP progress note as above

## 2015-08-09 NOTE — [facility] Group Notes (Signed)
[facility] Group Notes:  (Clinical Social Work)  08/09/2015  11:15-12:00PM  Summary of Progress/Problems:   Today's process group involved patients discussing their feelings related to being hospitalized. The patient expressed his/her primary feeling about being hospitalized is "very good.  I feel the staff cares about me."  Because many group members were highly anxious, most of group time was spent discussing suggestions for anxiety reduction, then trying each one.  This was very interactive.  The patient left the room after only a few minutes, did not return.  Type of Therapy:  Group Therapy - Process  Participation Level:  Minimal  Participation Quality:  Attentive  Affect:  Blunted  Cognitive:  Disorganized and Confused  Insight:  Limited  Engagement in Therapy:  Poor  Modes of Intervention:  Exploration, Discussion  Ambrose MantleMareida Grossman-Orr, LCSW 08/09/2015, 1:09 PM

## 2015-08-09 NOTE — Progress Notes (Signed)
D: Pt is confused, isolative and withdrawn to his room. Pt denied any form of depression, anxiety, pain, SI, HI and AVH; Pt was however observed responding to some internal stimuli. Pt remained calm and cooperative. A: Medications offered as prescribed.  Support, encouragement, and safe environment provided.  15-minute safety checks continue. R: Pt was med compliant.  Pt attended group. Safety checks continue

## 2015-08-09 NOTE — Progress Notes (Signed)
DAR NOTE: Patient presents with flat affect and depressed mood.  Denies pain, auditory and visual hallucinations.  Rates depression at 10, hopelessness at 5, and anxiety at 3.  Maintained on routine safety checks.  Medications given as prescribed.  Support and encouragement offered as needed. Patient observed responding to internal stimuli and laughing inappropriately.  Patient refused both breakfast and lunch.  Food and fluid intake encouraged.  Patient encouraged to maintain proper hygiene and grooming.  Minimal interaction with staff and peers.

## 2015-08-09 NOTE — [facility] Group Notes (Signed)
[facility] Group Notes:  (Nursing/MHT/Case Management/Adjunct)  Date:  08/09/2015  Time:  12:52 PM  Type of Therapy:  Nurse Education  Participation Level:  Did Not Attend   Mickie Baillizabeth O Iwenekha 08/09/2015, 12:52 PM

## 2015-08-10 LAB — CARBAMAZEPINE LEVEL, TOTAL: CARBAMAZEPINE LVL: 8.2 ug/mL (ref 4.0–12.0)

## 2015-08-10 MED ORDER — HALOPERIDOL 5 MG PO TABS
5.0000 mg | ORAL_TABLET | Freq: Once | ORAL | Status: AC
Start: 2015-08-11 — End: 2015-08-10
  Administered 2015-08-10: 5 mg via ORAL
  Filled 2015-08-10 (×2): qty 1

## 2015-08-10 NOTE — Progress Notes (Signed)
[facility]  08/10/2015 12:30 PM [name] Love  MRN:  [id number] Subjective:  Patient reports " I am okay, I am still hearing voices."  Objective:[name] Love is awake, alert and oriented to person and place. Patient reports I am feeling better, I am still hearing voices. Patient reports " I think these voices was past to me by my stepfather. Reports that his stepfather told him he experienced the same thing when he was younger.  Patient reports is biological father has a history of drug abuse and was unsure of medical history. Seen attending group session. Denies suicidal or homicidal ideation. Denies visual hallucination and does not appear to be responding to internal stimuli during this assessment. Patient is guarded but pleasant. Patient reports he is medication compliant and is tolerating medications well. States his depression 2/10. Reports good appetite and resting well. Support, encouragement and reassurance was provided.   Principal Problem: Schizophrenia, acute undifferentiated (HCC) Diagnosis:   Patient Active Problem List   Diagnosis Date Noted  . 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 Schizophrenia, acute (HCC)    History reviewed. No pertinent past surgical history. Family History:  Family History  Problem Relation Age of Onset  . Mental illness Cousin    Family Psychiatric  History: See Above Social History:  History  Alcohol Use  . Yes    Comment: "one time a month."     History  Drug Use No    Comment: per mother patient uses THC; patient denies THC; only admits to alcohol use    Social History   Social History  . Marital Status: Single    Spouse Name: N/A  . Number of  Children: N/A  . Years of Education: N/A   Social History Main Topics  . Smoking status: Current Every Day Smoker -- 0.25 packs/day for 4 years    Types: Cigarettes  . Smokeless tobacco: Never Used  . Alcohol Use: Yes     Comment: "one time a month."  . Drug Use: No     Comment: per mother patient uses THC; patient denies THC; only admits to alcohol use  . Sexual Activity: Not Currently   Other Topics Concern  . None   Social History Narrative   Additional Social History:    Pain Medications: SEE MAR Prescriptions: SEE MAR History of alcohol / drug use?: Yes Longest period of sobriety (when/how long): N/A Name of Substance 1: THC 1 - Age of First Use: 14 1 - Amount (size/oz): unsure 1 - Frequency: once 1 - Duration: only once 1 - Last Use / Amount: unsure                  Sleep: Fair  Appetite:  Poor  Current Medications: Current Facility-Administered Medications  Medication Dose Route Frequency Provider Last Rate Last Dose  . acetaminophen (TYLENOL) tablet 650 mg  650 mg Oral Q6H PRN Craige CottaFernando A Sharla Tankard, MD   650 mg at 08/10/15 0925  . amantadine (SYMMETREL) capsule 100 mg  100 mg Oral BID Jomarie LongsSaramma Eappen, MD   100 mg at 08/10/15 0802  . carbamazepine (TEGRETOL) tablet 200 mg  200 mg Oral BID PC Earney NavyJosephine C Onuoha, NP   200 mg at 08/10/15 0802  .  LORazepam (ATIVAN) tablet 1 mg  1 mg Oral Q8H PRN Earney Navy, NP   1 mg at 08/09/15 2012  . nicotine (NICODERM CQ - dosed in mg/24 hours) patch 14 mg  14 mg Transdermal Daily Jomarie Longs, MD   14 mg at 08/10/15 0805  . [START ON 08/18/2015] paliperidone (INVEGA SUSTENNA) injection 234 mg  234 mg Intramuscular Once Earney Navy, NP      . paliperidone (INVEGA) 24 hr tablet 9 mg  9 mg Oral QHS Earney Navy, NP   9 mg at 08/09/15 2135  . traZODone (DESYREL) tablet 100 mg  100 mg Oral QHS PRN Earney Navy, NP   100 mg at 08/09/15 2135    Lab Results: No results found for this or any previous visit  (from the past 48 hour(s)).  Blood Alcohol level:  Lab Results  Component Value Date   ETH <5 08/06/2015   ETH <5 07/08/2015    Physical Findings: AIMS: Facial and Oral Movements Muscles of Facial Expression: None, normal Lips and Perioral Area: None, normal Jaw: None, normal Tongue: None, normal,Extremity Movements Upper (arms, wrists, hands, fingers): None, normal Lower (legs, knees, ankles, toes): None, normal, Trunk Movements Neck, shoulders, hips: None, normal, Overall Severity Severity of abnormal movements (highest score from questions above): None, normal Incapacitation due to abnormal movements: None, normal Patient's awareness of abnormal movements (rate only patient's report): No Awareness, Dental Status Current problems with teeth and/or dentures?: No Does patient usually wear dentures?: No  CIWA:    COWS:     Musculoskeletal: Strength & Muscle Tone: within normal limits Gait & Station: normal Patient leans: N/A  Psychiatric Specialty Exam: Physical Exam  Nursing note and vitals reviewed. Constitutional: He is oriented to person, place, and time. He appears well-developed.  Cardiovascular: Normal rate.   Neurological: He is oriented to person, place, and time.  Psychiatric: He has a normal mood and affect. His behavior is normal.    Review of Systems  Psychiatric/Behavioral: Positive for hallucinations. Negative for depression. The patient is nervous/anxious.   All other systems reviewed and are negative.   Blood pressure 128/70, pulse 100, temperature 97.3 F (36.3 C), temperature source Oral, resp. rate 20, height  (1.778 m), weight 92.987 kg (205 lb).Body mass index is 29.41 kg/(m^2).  General Appearance: Casual  Eye Contact:  Good  Speech:  Clear and Coherent  Volume:  Normal  Mood:  Anxious and Depressed  Affect:  Blunt  Thought Process:  Coherent  Orientation:  Other:  Person and place  Thought Content:  Hallucinations: Auditory and  Rumination  Suicidal Thoughts:  No  Homicidal Thoughts:  No  Memory:  Immediate;   Fair Recent;   Fair Remote;   Fair  Judgement:  Poor  Insight:  Lacking  Psychomotor Activity:  Normal  Concentration:  Concentration: Fair  Recall:  [employer] of Knowledge:  Fair  Language:  Good  Akathisia:  No  Handed:  Right  AIMS (if indicated):     Assets:  Desire for Improvement Resilience  ADL's:  Intact  Cognition:  WNL  Sleep:  Number of Hours: 6.75     I agree with current treatment plan on 08/10/2015, Patient seen face-to-face for psychiatric evaluation follow-up, chart reviewed. Reviewed the information documented and agree with the treatment plan.  Treatment Plan Summary: Daily contact with patient to assess and evaluate symptoms and progress in treatment and Medication management Patient will benefit from inpatient treatment and  stabilization.  Estimated length of stay is 5-7 days.  Reviewed past medical records,treatment plan.  Will continue Invega 9 mg po qhs for psychosis . Will continue Invega sustenna as scheduled - next dose 08/18/15. Will continue Tegretol 200 mg po bid for mood lability/aggression. Tegretol level in 3 days. Will make available PRN medications as per agitation protocol. Will continue to monitor vitals ,medication compliance and treatment side effects while patient is here.  Will monitor for medical issues as well as call consult as needed.  Reviewed labs- lipid panel ( 07/15/15) - wnl , Hba1c- ( 07/15/15) - wnl , cbc- wnl, cmp - wnl , UDS - negative ,will order EKG for qtc. CSW will start working on disposition.  Patient to participate in therapeutic milieu .   Oneta Rack, NP 08/10/2015, 12:30 PM Agree with NP progress note as above

## 2015-08-10 NOTE — Progress Notes (Signed)
DAR NOTE: Patient presents with flat affect and depressed mood.  Denies auditory and visual hallucinations.  Rates depression at 7, hopelessness at 10, and anxiety at 10.  Maintained on routine safety checks.  Describes energy level as low and concentration as good.  Medications given as prescribed.  Support and encouragement offered as needed.  Attended group and participated.  States goal for today is "discharge."  Patient observed socializing with peers in the dayroom.  Tylenol 650 mg given for complain of headache with good effect.

## 2015-08-10 NOTE — [facility] Group Notes (Signed)
[facility] Group Notes:  (Clinical Social Work)  08/10/2015  11:00AM-12:00PM  Summary of Progress/Problems:  The main focus of today's process group was to listen to a variety of genres of music and to identify that different types of music provoke different responses.  The patient then was able to identify personally what was soothing for them, as well as energizing, as well as how patient can personally use this knowledge in sleep habits, with depression, and with other symptoms.  The patient expressed at the beginning of group the overall feeling of anxiety and confusion.  He stared a good deal throughout group and seemed hostile, then would make incongruent statements.  He was in and out of the group room, at times for periods of times.  Type of Therapy:  Music Therapy   Participation Level:  Minimal  Participation Quality:  Attentive and at times Inattentive  Affect:  Flat and Irritable  Cognitive:  DIsorganized and Confused  Insight:  Limited  Engagement in Therapy:  Limited  Modes of Intervention:   Activity, Exploration  Ambrose MantleMareida Grossman-Orr, LCSW 08/10/2015

## 2015-08-10 NOTE — [facility] Group Notes (Signed)
[facility] Group Notes:  (Nursing/MHT/Case Management/Adjunct)  Date:  08/10/2015  Time:  10:43 AM  Type of Therapy:  Nurse Education  Participation Level:  Minimal  Participation Quality:  Appropriate and Attentive  Affect:  Appropriate  Cognitive:  Alert and Appropriate  Insight:  Appropriate and Good  Engagement in Group:  Lacking  Modes of Intervention:  Discussion and Education  Summary of Progress/Problems: Topic was on healthy support system.  Discussed the importance of surrounding oneself with a good support system. Support systems are there to motivate, encourage, and assist with learning new coping skills that leads to a healthy lifestyle.  Patient was attentive and receptive.     Mickie Baillizabeth O Iwenekha 08/10/2015, 10:43 AM

## 2015-08-10 NOTE — Progress Notes (Signed)
D: Pt is confused, isolative and withdrawn to his room. Pt endorses auditory hallucinations; states, "I want these voices to stop; "They tell me I need to go live with different people; [place] Pt denied depression, anxiety, pain, SI or HI. Pt was observed responding to some internal stimuli. Pt remained calm and cooperative. A: Medications offered as prescribed.  Support, encouragement, and safe environment provided.  15-minute safety checks continue. R: Pt was med compliant.  Pt attended group. Safety checks continue

## 2015-08-10 NOTE — Progress Notes (Signed)
Adult Psychoeducational Group Note  Date:  08/10/2015 Time:  9:08 PM  Group Topic/Focus:  Wrap-Up Group:   The focus of this group is to help patients review their daily goal of treatment and discuss progress on daily workbooks.  Participation Level:  Minimal  Participation Quality:  Appropriate  Affect:  Flat  Cognitive:  Confused  Insight: Limited  Engagement in Group:  Limited  Modes of Intervention:  Socialization and Support  Additional Comments:  Patient attended and participated in group tonight. He reports that today he has been socializing with peers. He went to his groups. He advised that today he did not received his medication for anxiety and hearing voices.  [name] [name], [name] 08/10/2015, 9:08 PM

## 2015-08-11 NOTE — Progress Notes (Signed)
D: Pt has been isolative. He interacts minimally. He is reluctant to answer questions and speaks very softly. However, he has been able to sleep in his room. So far, he has handled having a roommate OK. He is med compliant.  A: Meds given as ordered. Q15 safety checks maintained. Support/encouragement offered. R: Pt remains free from harm and continues with treatment. Will continue to monitor for needs/safety.

## 2015-08-11 NOTE — Progress Notes (Signed)
D. Pt had been up and visible in milieu this evening, did attend evening group activity. Pt did endorse auditory hallucinations this evening and spoke about how they were getting louder and requested extra medication to help alleviate his hallucinations. Pt also was pacing and was in with a roommate but spoke about how he could not go back in the room with a roommate and was provided the quiet room. Pt reports that he does not experience adequate relief from [facility] and requested haldol. Pt reports he was on haldol in past and helped him with the voices and requested and received haldol 5mg  this evening.  A. Support and encouragement provided. R. Safety maintained, will continue to monitor.

## 2015-08-11 NOTE — Progress Notes (Signed)
States he has [employer] Guardian [place] Hines [phone]. CSW left voicemail, awaiting to return.   Samuella BruinKristin Kyen Taite, LCSW Clinical Social Worker [facility] [phone]

## 2015-08-11 NOTE — [facility] Group Notes (Signed)
   [facility] L[facility] Discharge Planning Group Note  08/11/2015  8:45 AM   Participation Quality: Alert, Appropriate and Oriented  Mood/Affect: Depressed and Flat  Depression Rating: 2  Anxiety Rating: 9  Thoughts of Suicide: Pt denies SI/HI  Will you contract for safety? Yes  Current AVH: Pt denies  Plan for Discharge/Comments: Pt attended discharge planning group and actively participated in group. CSW provided pt with today's workbook. Patient reports that he is no longer interested in group home placement. He is unsure where he will stay at discharge. He is requesting that CSW contact his guardian Margretta D[place]heryl Hines with [facility] [facility] [phone] regarding his discharge plans as he is feeling anxious to discharge soon.  Transportation Means: CSW continuing to assess  Supports: No supports mentioned at this time  Samuella BruinKristin Danzig Macgregor, MSW, [employer]CS[employer] Clinical Social Worker [facility] [phone]

## 2015-08-11 NOTE — Progress Notes (Signed)
[facility]  08/11/2015 2:53 PM [name] Love  MRN:  [id number] Subjective:  Patient reports "I am okay.  Patient  is non verbal.  I really need some sleep."  Objective:[name] Love is somnolent.  He was seen attending groups and is non disruptive per nursing staff.  Principal Problem: Schizophrenia, acute undifferentiated (HCC) Diagnosis:   Patient Active Problem List   Diagnosis Date Noted  . 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 Schizophrenia, acute (HCC)    History reviewed. No pertinent past surgical history. Family History:  Family History  Problem Relation Age of Onset  . Mental illness Cousin    Family Psychiatric  History: See Above Social History:  History  Alcohol Use  . Yes    Comment: "one time a month."     History  Drug Use No    Comment: per mother patient uses THC; patient denies THC; only admits to alcohol use    Social History   Social History  . Marital Status: Single    Spouse Name: N/A  . Number of Children: N/A  . Years of Education: N/A   Social History Main Topics  . Smoking status: Current Every Day Smoker -- 0.25 packs/day for 4 years    Types: Cigarettes  . Smokeless tobacco: Never Used  . Alcohol Use: Yes     Comment: "one time a month."  . Drug Use: No     Comment: per mother patient uses THC; patient denies THC; only admits to alcohol use  . Sexual Activity: Not Currently   Other Topics Concern  . None   Social History Narrative   Additional Social History:    Pain Medications: SEE MAR Prescriptions: SEE MAR History of alcohol / drug use?: Yes Longest period of sobriety (when/how long): N/A Name of Substance 1: THC 1 - Age of First Use:  14 1 - Amount (size/oz): unsure 1 - Frequency: once 1 - Duration: only once 1 - Last Use / Amount: unsure        Sleep: Fair  Appetite:  Poor  Current Medications: Current Facility-Administered Medications  Medication Dose Route Frequency Provider Last Rate Last Dose  . acetaminophen (TYLENOL) tablet 650 mg  650 mg Oral Q6H PRN Craige CottaFernando A Necia Kamm, MD   650 mg at 08/10/15 2325  . amantadine (SYMMETREL) capsule 100 mg  100 mg Oral BID Jomarie LongsSaramma Eappen, MD   100 mg at 08/11/15 0813  . carbamazepine (TEGRETOL) tablet 200 mg  200 mg Oral BID PC Earney NavyJosephine C Onuoha, NP   200 mg at 08/11/15 0813  . LORazepam (ATIVAN) tablet 1 mg  1 mg Oral Q8H PRN Earney NavyJosephine C Onuoha, NP   1 mg at 08/10/15 2125  . nicotine (NICODERM CQ - dosed in mg/24 hours) patch 14 mg  14 mg Transdermal Daily Jomarie LongsSaramma Eappen, MD   14 mg at 08/10/15 0805  . [START ON 08/18/2015] paliperidone (INVEGA SUSTENNA) injection 234 mg  234 mg Intramuscular Once Earney NavyJosephine C Onuoha, NP      . paliperidone (INVEGA) 24 hr tablet 9 mg  9 mg Oral QHS Earney NavyJosephine C Onuoha, NP   9 mg at 08/10/15 2125  . traZODone (DESYREL) tablet 100 mg  100 mg Oral QHS PRN  Earney Navy, NP   100 mg at 08/10/15 2125    Lab Results:  Results for orders placed or performed during the hospital encounter of 08/07/15 (from the past 48 hour(s))  Carbamazepine level, total     Status: None   Collection Time: 08/10/15  6:29 AM  Result Value Ref Range   Carbamazepine Lvl 8.2 4.0 - 12.0 ug/mL    Comment: Performed at [facility]    Blood Alcohol level:  Lab Results  Component Value Date   [facility] <5 08/06/2015   ETH <5 07/08/2015    Physical Findings: AIMS: Facial and Oral Movements Muscles of Facial Expression: None, normal Lips and Perioral Area: None, normal Jaw: None, normal Tongue: None, normal,Extremity Movements Upper (arms, wrists, hands, fingers): None, normal Lower (legs, knees, ankles, toes): None, normal, Trunk Movements Neck, shoulders,  hips: None, normal, Overall Severity Severity of abnormal movements (highest score from questions above): None, normal Incapacitation due to abnormal movements: None, normal Patient's awareness of abnormal movements (rate only patient's report): No Awareness, Dental Status Current problems with teeth and/or dentures?: No Does patient usually wear dentures?: No  CIWA:    COWS:     Musculoskeletal: Strength & Muscle Tone: within normal limits Gait & Station: normal Patient leans: N/A  Psychiatric Specialty Exam: Physical Exam  Nursing note and vitals reviewed. Constitutional: He is oriented to person, place, and time. He appears well-developed.  Cardiovascular: Normal rate.   Neurological: He is oriented to person, place, and time.  Psychiatric: He has a normal mood and affect. His behavior is normal.    Review of Systems  Psychiatric/Behavioral: Positive for hallucinations. Negative for depression. The patient is nervous/anxious.   All other systems reviewed and are negative.   Blood pressure 120/74, pulse 110, temperature 97.4 F (36.3 C), temperature source Oral, resp. rate 18, height  (1.778 m), weight 92.987 kg (205 lb).Body mass index is 29.41 kg/(m^2).  General Appearance: Casual  Eye Contact:  Good  Speech:  Clear and Coherent  Volume:  Normal  Mood:  Anxious and Depressed  Affect:  Blunt  Thought Process:  Coherent  Orientation:  Other:  Person and place  Thought Content:  Hallucinations: Auditory and Rumination  Suicidal Thoughts:  No  Homicidal Thoughts:  No  Memory:  Immediate;   Fair Recent;   Fair Remote;   Fair  Judgement:  Poor  Insight:  Lacking  Psychomotor Activity:  Normal  Concentration:  Concentration: Fair  Recall:  [employer] of Knowledge:  Fair  Language:  Good  Akathisia:  No  Handed:  Right  AIMS (if indicated):     Assets:  Desire for Improvement Resilience  ADL's:  Intact  Cognition:  WNL  Sleep:  Number of Hours: 5     Treatment Plan Summary: Daily contact with patient to assess and evaluate symptoms and progress in treatment and Medication management Patient will benefit from inpatient treatment and stabilization.  Estimated length of stay is 5-7 days.  Reviewed past medical records,treatment plan.  Will continue Invega 9 mg po qhs for psychosis . Will continue Invega sustenna as scheduled - next dose 08/18/15. continue Tegretol 200 mg po bid for mood lability/aggression. Tegretol level in 3 days. make available PRN medications as per agitation protocol. continue to monitor vitals ,medication compliance and treatment side effects while patient is here.  monitor for medical issues as well as call consult as needed.  Reviewed labs- lipid panel ( 07/15/15) - wnl ,  Hba1c- ( 07/15/15) - wnl , cbc- wnl, cmp - wnl , UDS - negative ,will order EKG for qtc. CSW will start working on disposition.  Patient to participate in therapeutic milieu .   Lindwood Qua, NP [facility] 08/11/2015, 2:53 PM Agree with NP progress note as above

## 2015-08-12 MED ORDER — DIPHENHYDRAMINE HCL 25 MG PO CAPS: 25.0000 mg | ORAL_CAPSULE | Freq: Four times a day (QID) | ORAL | Status: DC | PRN

## 2015-08-12 MED ORDER — HALOPERIDOL 5 MG PO TABS: 5.0000 mg | ORAL_TABLET | Freq: Three times a day (TID) | ORAL | Status: DC | PRN

## 2015-08-12 NOTE — Progress Notes (Signed)
Adult Psychoeducational Group Note  Date:  08/12/2015 Time:  8:49 PM  Group Topic/Focus:  Wrap-Up Group:   The focus of this group is to help patients review their daily goal of treatment and discuss progress on daily workbooks.  Participation Level:  Active  Participation Quality:  Appropriate  Affect:  Appropriate  Cognitive:  Appropriate  Insight: Appropriate  Engagement in Group:  Engaged  Modes of Intervention:  Discussion  Additional Comments: The patient expressed that he rates today a 7.The patient also said that he attended group.  Octavio Mannshigpen, Matha Masse Lee 08/12/2015, 8:49 PM

## 2015-08-12 NOTE — Progress Notes (Signed)
Recreation Therapy Notes  05.30.2017 approximately 3:00pm. LRT met with patient to investigate ways to enhance tx during admission. Patient reports his mother is his biggest stressor, describing this as she is always on her phone. Patient reports he responds to her lack of attention by stealing money from her, which makes her furious. Patient identified that he would like to go to the mall with his mother or possibly kickboxing. Patient additionally disclosed he sometimes feels out of control when he is angry, as he punches holes in walls and damages property (his and others). Due to patient interest in kickboxing and desire to spend additional time with his mother, LRT offered to provide patient with community resources he can access post d/c, patient apathetic towards offer.   LRT will provide prior to d/c.   Patient presented with significant body odor. LRT addressed with patient, patient indicated he showered as recently as last night. LRT encouraged patient to shower and to have his clothes washed in an effort to reduce the smell of body odor. Patient appeared uninterested in remedying body odor issue. MHT informed of LRT instructions to patient.   [name] Love, LRT/CTRS   Lane Hacker 08/12/2015 4:32 PM

## 2015-08-12 NOTE — BHH Suicide Risk Assessment (Signed)
B[facility] INPATIENT:  Family/Significant Other Suicide Prevention Education  Suicide Prevention Education:  Education Completed; mother [name] [phone],  (name of family member/significant other) has been identified by the patient as the family member/significant other with whom the patient will be residing, and identified as the person(s) who will aid the patient in the event of a mental health crisis (suicidal ideations/suicide attempt).  With written consent from the patient, the family member/significant other has been provided the following suicide prevention education, prior to the and/or following the discharge of the patient.  The suicide prevention education provided includes the following:  Suicide risk factors  Suicide prevention and interventions  National Suicide Hotline telephone number  [facility] assessment telephone number  [facility] Emergency Assistance 911  [facility] and/or Residential Mobile Crisis Unit telephone number  Request made of family/significant other to:  Remove weapons (e.g., guns, rifles, knives), all items previously/currently identified as safety concern.    Remove drugs/medications (over-the-counter, prescriptions, illicit drugs), all items previously/currently identified as a safety concern.  The family member/significant other verbalizes understanding of the suicide prevention education information provided.  The family member/significant other agrees to remove the items of safety concern listed above.  Jillyn Stacey, [place]Kristin L 08/12/2015, 1:58 PM

## 2015-08-12 NOTE — Progress Notes (Signed)
[facility] MD Progress Note  08/12/2015 12:29 PM [name] Love  MRN:  [id number] Subjective:  Patient states " I am fine.'    Objective:[name] Love is a 20 y.o. [name] , single , lives with mother in [place] , has a prior history of schizophrenia who presented to [facility] voluntarily brought in by [facility] for aggressive behavior, HI and SI at home.  Patient seen and chart reviewed.Discussed patient with treatment team.  Pt continues to stay withdrawn , paranoid , his speech is limited but tangential . Pt has been making progress since admission, is trying to attend some groups . Pt denies any AH/VH today . His mood has been improving , he has had no disruptive issues noted on the unit. He however completely lacks insight in to his illness , continues to need a lot of encouragement and support.   Principal Problem: Schizophrenia, acute undifferentiated (HCC) Diagnosis:   Patient Active Problem List   Diagnosis Date Noted  . 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 Schizophrenia, acute (HCC)    History reviewed. No pertinent past surgical history. Family History: Please see H&P.  Family History  Problem Relation Age of Onset  . Mental illness Cousin    Family Psychiatric  History: See Above Social History:  History  Alcohol Use  . Yes    Comment: "one time a month."     History  Drug Use No    Comment: per mother patient uses THC; patient denies THC; only admits to alcohol use    Social History   Social History  . Marital Status: Single    Spouse Name: N/A  . Number of Children: N/A  . Years of Education: N/A   Social History Main Topics  . Smoking status: Current Every Day Smoker  -- 0.25 packs/day for 4 years    Types: Cigarettes  . Smokeless tobacco: Never Used  . Alcohol Use: Yes     Comment: "one time a month."  . Drug Use: No     Comment: per mother patient uses THC; patient denies THC; only admits to alcohol use  . Sexual Activity: Not Currently   Other Topics Concern  . None   Social History Narrative   Additional Social History:    Pain Medications: SEE MAR Prescriptions: SEE MAR History of alcohol / drug use?: Yes Longest period of sobriety (when/how long): N/A Name of Substance 1: THC 1 - Age of First Use: 14 1 - Amount (size/oz): unsure 1 - Frequency: once 1 - Duration: only once 1 - Last Use / Amount: unsure        Sleep: Fair  Appetite:  Fair  Current Medications: Current Facility-Administered Medications  Medication Dose Route Frequency Provider Last Rate Last Dose  . acetaminophen (TYLENOL) tablet 650 mg  650 mg Oral Q6H PRN Craige Cotta, MD   650 mg at 08/10/15 2325  . amantadine (SYMMETREL) capsule 100 mg  100 mg Oral BID Jomarie Longs, MD   100 mg at 08/12/15 0835  . carbamazepine (TEGRETOL) tablet 200 mg  200 mg Oral BID PC Earney Navy, NP   200 mg at 08/12/15 0835  . haloperidol (HALDOL) tablet 5 mg  5 mg Oral Q8H  PRN Jomarie Longs, MD       And  . diphenhydrAMINE (BENADRYL) capsule 25 mg  25 mg Oral Q6H PRN Jomarie Longs, MD      . LORazepam (ATIVAN) tablet 1 mg  1 mg Oral Q8H PRN Earney Navy, NP   1 mg at 08/11/15 2130  . nicotine (NICODERM CQ - dosed in mg/24 hours) patch 14 mg  14 mg Transdermal Daily Jomarie Longs, MD   14 mg at 08/12/15 0835  . [START ON 08/18/2015] paliperidone (INVEGA SUSTENNA) injection 234 mg  234 mg Intramuscular Once Earney Navy, NP      . paliperidone (INVEGA) 24 hr tablet 9 mg  9 mg Oral QHS Earney Navy, NP   9 mg at 08/11/15 2130  . traZODone (DESYREL) tablet 100 mg  100 mg Oral QHS PRN Earney Navy, NP   100 mg at 08/11/15 2130    Lab Results:  No  results found for this or any previous visit (from the past 48 hour(s)).  Blood Alcohol level:  Lab Results  Component Value Date   ETH <5 08/06/2015   ETH <5 07/08/2015    Physical Findings: AIMS: Facial and Oral Movements Muscles of Facial Expression: None, normal Lips and Perioral Area: None, normal Jaw: None, normal Tongue: None, normal,Extremity Movements Upper (arms, wrists, hands, fingers): None, normal Lower (legs, knees, ankles, toes): None, normal, Trunk Movements Neck, shoulders, hips: None, normal, Overall Severity Severity of abnormal movements (highest score from questions above): None, normal Incapacitation due to abnormal movements: None, normal Patient's awareness of abnormal movements (rate only patient's report): No Awareness, Dental Status Current problems with teeth and/or dentures?: No Does patient usually wear dentures?: No  CIWA:    COWS:     Musculoskeletal: Strength & Muscle Tone: within normal limits Gait & Station: normal Patient leans: N/A  Psychiatric Specialty Exam: Physical Exam  Nursing note and vitals reviewed. Constitutional: He is oriented to person, place, and time. He appears well-developed.  Cardiovascular: Normal rate.   Neurological: He is oriented to person, place, and time.  Psychiatric: He has a normal mood and affect. His behavior is normal.    Review of Systems  Psychiatric/Behavioral: The patient is nervous/anxious.   All other systems reviewed and are negative.   Blood pressure 115/52, pulse 120, temperature 98.2 F (36.8 C), temperature source Oral, resp. rate 14, height  (1.778 m), weight 92.987 kg (205 lb).Body mass index is 29.41 kg/(m^2).  General Appearance: Guarded  Eye Contact:  None  Speech:  slow , limited  Volume:  Normal  Mood:  Anxious and Depressed improving  Affect:  Depressed  Thought Process:  Irrelevant and Descriptions of Associations: Tangential  Orientation:  Other:  Person and place   Thought Content:  Hallucinations: Auditory and Rumination improving , he continues to be paranoid , stays withdrawn   Suicidal Thoughts:  No  Homicidal Thoughts:  No  Memory:  Immediate;   Fair Recent;   Fair Remote;   Fair  Judgement:  Poor  Insight:  Lacking  Psychomotor Activity:  Normal  Concentration:  Concentration: Fair  Recall:  [employer] of Knowledge:  Fair  Language:  Good  Akathisia:  No  Handed:  Right  AIMS (if indicated):     Assets:  Desire for Improvement Resilience  ADL's:  Intact  Cognition:  WNL  Sleep:  Number of Hours: 6.5    Treatment Plan Summary:[name] Love is a 20 y.o. [name] ,  single , [place] , has a prior history of schizophrenia who [facility], HI and SI at home.Pt continues to be delusional , withdrawn. Will continue treatment.  Daily contact with patient to assess and evaluate symptoms and progress in treatment and Medication management  Will continue Invega 9 mg po qhs for psychosis . Will continue Invega sustenna as scheduled - next dose 08/18/15. Continue Tegretol 200 mg po bid for mood lability/aggression. Tegretol level - 8.2 ( 08/10/15) - wnl  Continue PRN medications as per agitation protocol. Continue to monitor vitals ,medication compliance and treatment side effects while patient is here.  Will monitor for medical issues as well as call consult as needed.  Will order  EKG for qtc. CSW will continue working on disposition.  Recreational therapy consult. Patient to participate in therapeutic milieu .   Tricia Oaxaca, MD  08/12/2015, 12:29 PM

## 2015-08-12 NOTE — Progress Notes (Signed)
D.Pt has been up and visible in milieu this evening, pt did complain of anxiety and auditory hallucinations but has not appeared as restless or agitated as he was the previous night. Pt reports the voices were still there and spoke about how his medications and asked about haldol again this evening. Pt was able to receive all medications without incident and thus far has been able to tolerate his roommate this evening. A.Pt educated about medications and encouraged to speak with psychiatrist about his medications in morning. R. Pt verbalized understanding, safety maintained.

## 2015-08-12 NOTE — Progress Notes (Addendum)
D: Pt +ve AH  denies SI/HI/VH. Pt is pleasant and cooperative. Pt very visible on milieu.   A: Pt was offered support and encouragement. Pt was given scheduled medications. Pt was encourage to attend groups. Q 15 minute checks were done for safety.   R:Pt attends groups and interacts well with peers and staff. Pt is taking medication. Pt has no complaints.Pt receptive to treatment and safety maintained on unit.

## 2015-08-12 NOTE — Progress Notes (Signed)
D- Patient alert and oriented. Patient observed in milieu interacting well with peers.  Patient attended groups.  Patient denies SI/HI.  Patient has complaints of auditory hallucinations "They tell me to call people".  Patient reports "voices getting better".  Patient reports that he was upset because he did not have any visitors during visitation due to his behavior prior to admission.   A- Scheduled medications administered to patient, per MD orders. Support and encouragement provided.  Routine safety checks conducted every 15 minutes.  Patient informed to notify staff with problems or concerns. R-  Patient contracts for safety at this time. Patient compliant with medications and treatment plan. Patient remains safe at this time.

## 2015-08-12 NOTE — [facility] Group Notes (Signed)
[facility] Group Notes:  (Nursing/MHT/Case Management/Adjunct)  Date:  08/12/2015  Time:  12:28 PM  Type of Therapy:  Nurse Education  Participation Level:  Did Not Attend    [name] Love 08/12/2015, 12:28 PM 

## 2015-08-13 MED ORDER — AMANTADINE HCL 100 MG PO CAPS: 100.0000 mg | ORAL_CAPSULE | Freq: Two times a day (BID) | ORAL | Status: DC

## 2015-08-13 MED ORDER — TRAZODONE HCL 100 MG PO TABS: 100.0000 mg | ORAL_TABLET | Freq: Every evening | ORAL | Status: DC | PRN

## 2015-08-13 MED ORDER — PALIPERIDONE PALMITATE 234 MG/1.5ML IM SUSP: 234.0000 mg | Freq: Once | INTRAMUSCULAR | Status: DC

## 2015-08-13 MED ORDER — CARBAMAZEPINE 200 MG PO TABS: 200.0000 mg | ORAL_TABLET | Freq: Two times a day (BID) | ORAL | Status: DC

## 2015-08-13 MED ORDER — PALIPERIDONE ER 3 MG PO TB24
3.0000 mg | ORAL_TABLET | Freq: Every day | ORAL | Status: DC
  Filled 2015-08-13: qty 7

## 2015-08-13 MED ORDER — PALIPERIDONE ER 3 MG PO TB24
9.0000 mg | ORAL_TABLET | Freq: Every day | ORAL | Status: DC
  Filled 2015-08-13: qty 21

## 2015-08-13 MED ORDER — PALIPERIDONE ER 6 MG PO TB24
6.0000 mg | ORAL_TABLET | Freq: Every day | ORAL | Status: DC
  Filled 2015-08-13: qty 7

## 2015-08-13 MED ORDER — NICOTINE 14 MG/24HR TD PT24: 14.0000 mg | MEDICATED_PATCH | Freq: Every day | TRANSDERMAL | Status: DC

## 2015-08-13 MED ORDER — PALIPERIDONE ER 9 MG PO TB24: 9.0000 mg | ORAL_TABLET | Freq: Every day | ORAL | Status: DC

## 2015-08-13 NOTE — Progress Notes (Signed)
Pt d/c from hospital. Security escorted pt to bus stop. D/C instructions given, prescriptions given, samples given and bus passes given. All items returned. Pt denies si and hi.

## 2015-08-13 NOTE — Progress Notes (Signed)
  [facility] Discharge [facility] :  Will you be returning to the same living situation after discharge:  Yes,  home At discharge, do you have transportation home?: Yes,  family Do you have the ability to pay for your medications: Yes,  ACT team  Release of information consent forms completed and in the chart;  Patient's signature needed at discharge.  Patient to Follow up at: Follow-up Information    Follow up with [facility]  On 08/18/2015.   Why:  Call on Monday May 5th to inquire about starting ACTT services for therapy and medication management.    Contact information:   [address] [place], [place] [address] TEL: [phone] FAX: [phone]      Next level of care provider has access to [facility] Link:no  Safety Planning and Suicide Prevention discussed: Yes,  yes  Have you used any form of tobacco in the last 30 days? (Cigarettes, Smokeless Tobacco, Cigars, and/or Pipes): Yes  Has patient been referred to the Quitline?: Patient refused referral  Patient has been referred for addiction treatment: N/A  Daryel Geraldorth, Keyle Doby B 08/13/2015, 10:03 AM

## 2015-08-13 NOTE — Progress Notes (Signed)
Recreation Therapy Notes  05.31.2017 Patient provided information on boxing gym [place] [facility]. LRT encouraged patient to use boxing gym to more appropriately handle his anger and to possible spend time with his mother, as this was an interest of his.   Marykay Lexenise L Prisma Decarlo, LRT/CTRS    Jearl KlinefelterBlanchfield, Miko Sirico L 08/13/2015 4:15 PM

## 2015-08-13 NOTE — Discharge Summary (Signed)
Physician Discharge Summary Note  [name] is an 20 y.o., male MRN:  [id number] DOB:  02-07-1996 Patient phone:  [phone] (home)  Patient address:   [address] [place] [address],  Total Time spent with patient: 30 minutes  Date of Admission:  08/07/2015  Date of Discharge: 08/12/15  Reason for Admission:  Aggressive behavior, homicidal & suicidal ideations.  Principal Problem: Schizophrenia, acute undifferentiated [facility])  Discharge Diagnoses: Patient Active Problem List   Diagnosis Date Noted  . 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 [name], single, lives with mother in [place] , has a prior history of schizophrenia who presented to [facility] Emergency Department voluntarily brought in by [facility] for aggressive behavior, HI and SI at home.  [name] Love was admitted to the [facility] adult unit for worsening symptoms of Schizophrenia related to nonadherent to his medication regimen. He has had frequent inpatient psychiatric admissions/treatments at the [facility], [facility] & [facility]. He was also receiving his follow-up care on outpatient basis at the [facility] clinic here in [place], [place] with the support of an ACT Team as well. Reports indicated that the reasons for his present admission were due to aggressiveness, suicidal & homicidal ideations towards his family. He was in need of mood stabilization treatments. During his admission assessment, [name] Love was evaluated and his symptoms were identified. The medication regimen for his symptoms were discussed & initiated targeting those presenting symptoms. He was oriented to the unit & encouraged to participate in the unit programming. He presented no other pre-existing medical problems that required treatments. He  tolerated his treatment regimen without any adverse effects or reactions.  During his hospital stay, [name] Love was evaluated each day by a clinical provider to ascertain his response to his treatment regimen. The medication changes & adjustments were made according to need. As the day goes by, improvement was noted as evidenced by his report of decreasing symptoms, improved mood, affect, medication tolerance & participation in the unit programming.  He was required on daily basis to complete a self inventory asssessment noting mood, mental status,  new symptoms, anxiety & or concerns. His symptoms responded well to his treatment regimen, being in a therapeutic and supportive environment also assisted in his mood stability. [name] Love did present appropriate behavior & was motivated for recovery. He worked closely with the treatment team and case manager to develop a discharge plan with appropriate goals to maintain mood stability after discharge.   On this day of his discharge, [name] Love was in much improved condition than upon admission. His symptoms were reported as significantly decreased or resolved completely. Upon discharge, he denies SIHI & AVH. He was motivated to continue taking medication with a goal of continued improvement in mental health. He was medicated & discharged on; [name] Love 234 mg IM due to be given on 08-18-15, Invega 24 hour tablet 9 mg daily x 14 days, Tegretol 200 mg for mood stabilization, Amantadine capsule 100 mg for EPS & Trazodone 100 mg for insomnia. [name] Love will resume mental health care as noted below. He is provided with all the necessary information needed to make this appointment without problems. He left [facility] in no apparent distress. Transportation per family.  Past Psychiatric History: Schizophrenia, (had seen a psychiatrist in [place]  Past Medical History:  Past Medical  History  Diagnosis Date  . Schizophrenia, acute (HCC)    History reviewed. No pertinent past  surgical history.  Family History:  Family History  Problem Relation Age of Onset  . Mental illness Neg Hx    Family Psychiatric History: Completed suicide: Cousin  Social History: single, never married, lives in a house with his mother and his brother (42) and sister (53)- who is reportedly on disability for for a learning disability. Graduated high school at age of 12. Patient reports having been in special education classes in school. Legal history denies  History  Alcohol Use  . Yes    Comment: "one time a month."     History  Drug Use No    Comment: per mother patient uses THC; patient denies THC; only admits to alcohol use    Social History   Social History  . Marital Status: Single    Spouse Name: N/A  . Number of Children: N/A  . Years of Education: N/A   Social History Main Topics  . Smoking status: Current Every Day Smoker -- 0.25 packs/day for 4 years    Types: Cigarettes  . Smokeless tobacco: Never Used  . Alcohol Use: Yes     Comment: "one time a month."  . Drug Use: No     Comment: per mother patient uses THC; patient denies THC; only admits to alcohol use  . Sexual Activity: Not Currently   Other Topics Concern  . None   Social History Narrative   Hospital Course: [name] Love is a 20 y.o. [name] , single , lives with mother in [place], [place], has a prior history of schizophrenia who presented to [facility] Emergency Department voluntarily brought in by [facility] for.   Physical Findings: AIMS: Facial and Oral Movements Muscles of Facial Expression: None, normal Lips and Perioral Area: None, normal Jaw: None, normal Tongue: None, normal,Extremity Movements Upper (arms, wrists, hands, fingers): None, normal Lower (legs, knees, ankles, toes): None, normal, Trunk Movements Neck, shoulders, hips: None, normal, Overall Severity Severity of abnormal movements (highest score from questions above): None, normal Incapacitation due to abnormal  movements: None, normal Patient's awareness of abnormal movements (rate only patient's report): No Awareness, Dental Status Current problems with teeth and/or dentures?: No Does patient usually wear dentures?: No  CIWA:    COWS:     Musculoskeletal: Strength & Muscle Tone: within normal limits Gait & Station: normal Patient leans: N/A  Psychiatric Specialty Exam: Review of Systems  Constitutional: Negative.   HENT: Negative.   Eyes: Negative.   Respiratory: Negative.   Cardiovascular: Negative.   Gastrointestinal: Negative.   Genitourinary: Negative.   Musculoskeletal: Negative.   Skin: Negative.   Neurological: Negative.   Endo/Heme/Allergies: Negative.   Psychiatric/Behavioral: Negative.     Blood pressure 115/52, pulse 120, temperature 98.2 F (36.8 C), temperature source Oral, resp. rate 14, height  (1.778 m), weight 92.987 kg (205 lb).Body mass index is 29.41 kg/(m^2).  See Md's SRA  Have you used any form of tobacco in the last 30 days? (Cigarettes, Smokeless Tobacco, Cigars, and/or Pipes): Yes  Has this patient used any form of tobacco in the last 30 days? (Cigarettes, Smokeless Tobacco, Cigars, and/or Pipes) Yes, No  Blood Alcohol level:  Lab Results  Component Value Date   [facility] <5 08/06/2015   ETH <5 07/08/2015   Metabolic Disorder Labs:  Lab Results  Component Value Date   HGBA1C 5.4 07/15/2015   MPG 117 11/27/2014   Lab  Results  Component Value Date   PROLACTIN 23.2* 02/04/2015   PROLACTIN 35.2* 11/27/2014   Lab Results  Component Value Date   CHOL 225* 07/15/2015   TRIG 74 07/15/2015   HDL 58 07/15/2015   CHOLHDL 3.9 07/15/2015   VLDL 15 07/15/2015   LDLCALC 152* 07/15/2015   LDLCALC 164* 11/27/2014   See Psychiatric Specialty Exam and Suicide Risk Assessment completed by Attending Physician prior to discharge.  Discharge destination:  Home  Is patient on multiple antipsychotic therapies at discharge:  Yes,   Do you recommend tapering  to monotherapy for antipsychotics?  Yes   Has Patient had three or more failed trials of antipsychotic monotherapy by history:  No  Recommended Plan for Multiple Antipsychotic Therapies: NA    Medication List    TAKE these medications      Indication   amantadine 100 MG capsule  Commonly known as:  SYMMETREL  Take 1 capsule (100 mg total) by mouth 2 (two) times daily. For prevention of drug induced tremors   Indication:  Extrapyramidal Reaction caused by Medications     carbamazepine 200 MG tablet  Commonly known as:  TEGRETOL  Take 1 tablet (200 mg total) by mouth 2 (two) times daily after a meal. For mood stabilization   Indication:  Mood stabilization     nicotine 14 mg/24hr patch  Commonly known as:  NICODERM CQ - dosed in mg/24 hours  Place 1 patch (14 mg total) onto the skin daily. For smoking cessation   Indication:  Nicotine Addiction     paliperidone 234 MG/1.5ML Susp injection  Commonly known as:  INVEGA Love  Inject 234 mg into the muscle once. (Due to be administered on 08-18-15): For mood control  Start taking on:  08/18/2015   Indication:  Schizophrenia     paliperidone 9 MG 24 hr tablet  Commonly known as:  INVEGA  Take 1 tablet (9 mg total) by mouth at bedtime. For mood control   Indication:  Schizophrenia     traZODone 100 MG tablet  Commonly known as:  DESYREL  Take 1 tablet (100 mg total) by mouth at bedtime as needed for sleep.   Indication:  Trouble Sleeping       Follow-up Information    Follow up with [facility]  On 08/18/2015.   Why:  Call on Monday May 5th to inquire about starting ACTT services for therapy and medication management.    Contact information:   [address] [place], [place] [address] TEL: [phone] FAX: [phone]     Signed: Armandina StammerNwoko, Agnes I, NP, PMHNP-BC 08/13/2015, 11:48 AM
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
## 2015-08-13 NOTE — [facility] Suicide Risk Assessment (Signed)
[facility] Dis[facility]icide [facility]   Principal Problem: Schizophrenia, acute undifferentiated [facility](HCC) Discharge Diagnoses:  Patient Active Problem List   Diagnosis Date Noted  . 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 Negative for depression, suicidal ideas and hallucinations.  All other systems reviewed and are negative.   Blood pressure 115/52, pulse 120, temperature 98.2 F (36.8 C), temperature source Oral, resp. rate 14, height 5\' 10"  (1.778 m), weight 92.987 kg (205 lb).Body mass index is 29.41 kg/(m^2).  General Appearance: Fairly Groomed  Patent attorneyye Contact::  Fair  Speech:  Normal [id number]  Volume:  Normal  Mood:  Euthymic  Affect:  Appropriate  Thought Process:  Goal Directed  Orientation:  Full (Time, Place, and Person)  Thought Content:  WDL  Suicidal Thoughts:  No  Homicidal Thoughts:  No  Memory:  Immediate;   Fair Recent;   Fair Remote;   Fair  Judgement:  Fair  Insight:  Fair  Psychomotor Activity:  Normal  Concentration:  Fair  Recall:  FiservFair  Fund of Knowledge:Fair  Language: Fair  Akathisia:  No  Handed:  Right  AIMS (if indicated):     Assets:  Desire for Improvement Physical Health  Sleep:  Number of Hours: 3  Cognition: WNL  ADL's:  Intact   Mental Status Per Nursing Assessment::   On Admission:     Demographic Factors:  Male  Loss Factors: NA  Historical Factors: Impulsivity  Risk Reduction Factors:   Living with another person, especially a relative and Positive social support  Continued Clinical Symptoms:  Alcohol/Substance Abuse/Dependencies Previous  Psychiatric Diagnoses and Treatments  Cognitive Features That Contribute To Risk:  None    Suicide Risk:  Minimal: No identifiable suicidal ideation.  Patients presenting with no risk factors but with morbid ruminations; may be classified as minimal risk based on the severity of the depressive symptoms  Follow-up Information    Follow up with [facility]  On 08/18/2015.   Why:  Call on Monday May 5th to inquire about starting ACTT services for therapy and medication management.    Contact information:   [address] [place], [place] [address] TEL: [phone] FAX: [phone]      Plan Of Care/Follow-up recommendations:  Activity:  no restrictions Diet:  regular Tests:  as needed Other:  follow up with aftercare  Yamil Dougher, MD 08/13/2015, 9:34 AM

## 2015-08-16 ENCOUNTER — Encounter (HOSPITAL_COMMUNITY): Payer: Self-pay | Admitting: Nurse Practitioner

## 2015-08-16 ENCOUNTER — Emergency Department (HOSPITAL_COMMUNITY)
Admission: EM | Admit: 2015-08-16 | Discharge: 2015-08-17 | Disposition: A | Discharge status: Other Acute Inpatient Hospital | Payer: Medicaid Other | Attending: Emergency Medicine | Admitting: Emergency Medicine

## 2015-08-16 DIAGNOSIS — F17210 Nicotine dependence, cigarettes, uncomplicated: Secondary | ICD-10-CM | POA: Insufficient documentation

## 2015-08-16 DIAGNOSIS — Z91040 Latex allergy status: Secondary | ICD-10-CM | POA: Insufficient documentation

## 2015-08-16 DIAGNOSIS — Z91010 Allergy to peanuts: Secondary | ICD-10-CM | POA: Insufficient documentation

## 2015-08-16 DIAGNOSIS — F203 Undifferentiated schizophrenia: Secondary | ICD-10-CM | POA: Diagnosis present

## 2015-08-16 DIAGNOSIS — Z79899 Other long term (current) drug therapy: Secondary | ICD-10-CM | POA: Insufficient documentation

## 2015-08-16 DIAGNOSIS — F22 Delusional disorders: Secondary | ICD-10-CM | POA: Insufficient documentation

## 2015-08-16 LAB — COMPREHENSIVE METABOLIC PANEL
ALT: 24 U/L (ref 17–63)
AST: 22 U/L (ref 15–41)
Albumin: 4.5 g/dL (ref 3.5–5.0)
Alkaline Phosphatase: 80 U/L (ref 38–126)
Anion gap: 6 (ref 5–15)
BILIRUBIN TOTAL: 0.2 mg/dL — AB (ref 0.3–1.2)
BUN: 9 mg/dL (ref 6–20)
CHLORIDE: 104 mmol/L (ref 101–111)
CO2: 28 mmol/L (ref 22–32)
CREATININE: 1.19 mg/dL (ref 0.61–1.24)
Calcium: 9.1 mg/dL (ref 8.9–10.3)
Glucose, Bld: 96 mg/dL (ref 65–99)
Potassium: 4 mmol/L (ref 3.5–5.1)
Sodium: 138 mmol/L (ref 135–145)
TOTAL PROTEIN: 7.8 g/dL (ref 6.5–8.1)

## 2015-08-16 LAB — CBC WITH DIFFERENTIAL/PLATELET
Basophils Absolute: 0 10*3/uL (ref 0.0–0.1)
Basophils Relative: 1 %
EOS PCT: 6 %
Eosinophils Absolute: 0.3 10*3/uL (ref 0.0–0.7)
HEMATOCRIT: 40.9 % (ref 39.0–52.0)
Hemoglobin: 13.7 g/dL (ref 13.0–17.0)
LYMPHS ABS: 1.5 10*3/uL (ref 0.7–4.0)
LYMPHS PCT: 37 %
MCH: 29.5 pg (ref 26.0–34.0)
MCHC: 33.5 g/dL (ref 30.0–36.0)
MCV: 88.1 fL (ref 78.0–100.0)
MONO ABS: 0.4 10*3/uL (ref 0.1–1.0)
Monocytes Relative: 11 %
NEUTROS ABS: 1.9 10*3/uL (ref 1.7–7.7)
Neutrophils Relative %: 45 %
PLATELETS: 235 10*3/uL (ref 150–400)
RBC: 4.64 MIL/uL (ref 4.22–5.81)
RDW: 13 % (ref 11.5–15.5)
WBC: 4.2 10*3/uL (ref 4.0–10.5)

## 2015-08-16 LAB — CARBAMAZEPINE LEVEL, TOTAL: CARBAMAZEPINE LVL: 10 ug/mL (ref 4.0–12.0)

## 2015-08-16 LAB — ETHANOL

## 2015-08-16 LAB — RAPID URINE DRUG SCREEN, HOSP PERFORMED
AMPHETAMINES: NOT DETECTED
Barbiturates: NOT DETECTED
Benzodiazepines: NOT DETECTED
Cocaine: NOT DETECTED
OPIATES: NOT DETECTED
Tetrahydrocannabinol: NOT DETECTED

## 2015-08-16 MED ORDER — AMANTADINE HCL 100 MG PO CAPS
100.0000 mg | ORAL_CAPSULE | Freq: Two times a day (BID) | ORAL | Status: DC
  Administered 2015-08-16: 100 mg via ORAL
  Filled 2015-08-16 (×3): qty 1

## 2015-08-16 MED ORDER — TRAZODONE HCL 100 MG PO TABS: 100.0000 mg | ORAL_TABLET | Freq: Every evening | ORAL | Status: DC | PRN

## 2015-08-16 MED ORDER — IBUPROFEN 200 MG PO TABS: 600.0000 mg | ORAL_TABLET | Freq: Three times a day (TID) | ORAL | Status: DC | PRN

## 2015-08-16 MED ORDER — PALIPERIDONE ER 6 MG PO TB24
6.0000 mg | ORAL_TABLET | Freq: Every day | ORAL | Status: DC
  Administered 2015-08-16: 6 mg via ORAL
  Filled 2015-08-16 (×2): qty 1

## 2015-08-16 MED ORDER — ALUM & MAG HYDROXIDE-SIMETH 200-200-20 MG/5ML PO SUSP: 30.0000 mL | ORAL | Status: DC | PRN

## 2015-08-16 MED ORDER — ONDANSETRON HCL 4 MG PO TABS: 4.0000 mg | ORAL_TABLET | Freq: Three times a day (TID) | ORAL | Status: DC | PRN

## 2015-08-16 MED ORDER — NICOTINE 14 MG/24HR TD PT24: 14.0000 mg | MEDICATED_PATCH | Freq: Every day | TRANSDERMAL | Status: DC

## 2015-08-16 MED ORDER — ACETAMINOPHEN 325 MG PO TABS: 650.0000 mg | ORAL_TABLET | ORAL | Status: DC | PRN

## 2015-08-16 MED ORDER — CARBAMAZEPINE 200 MG PO TABS
200.0000 mg | ORAL_TABLET | Freq: Two times a day (BID) | ORAL | Status: DC
  Administered 2015-08-16: 200 mg via ORAL
  Filled 2015-08-16 (×5): qty 1

## 2015-08-16 MED ORDER — ZOLPIDEM TARTRATE 5 MG PO TABS: 5.0000 mg | ORAL_TABLET | Freq: Every evening | ORAL | Status: DC | PRN
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
## 2015-08-16 NOTE — ED Notes (Signed)
[facility] with [facility].

## 2015-08-16 NOTE — ED Notes (Signed)
Pt belongings: Black slippers, black jeans, grey shorts, black shirt
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
## 2015-08-16 NOTE — ED Notes (Signed)
Patient cooperative. Appears confused, distracted, bizarre. Denies SI, HI, AVH. Denies anxiety and feelings of depression but appears tearful. Patient then states he is sad.   Encouragement offered. Oriented to the unit.   Q 15 safety checks in place.
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
## 2015-08-16 NOTE — ED Notes (Signed)
Patient denies SI, HI and AVH at this time. Plan of care discussed with patient. Patient voices no complaints or concerns at this this time. Encouragement and support provided and safety maintain. Q 15 min safety checks remain in place.

## 2015-08-17 ENCOUNTER — Encounter (HOSPITAL_COMMUNITY): Payer: Self-pay

## 2015-08-17 ENCOUNTER — Inpatient Hospital Stay (HOSPITAL_COMMUNITY)
Admission: AD | Admit: 2015-08-17 | Discharge: 2015-08-22 | DRG: 885 | Disposition: A | Discharge status: Home / Self Care | Payer: Medicaid Other | Source: Intra-hospital | Attending: Psychiatry | Admitting: Psychiatry

## 2015-08-17 DIAGNOSIS — F17210 Nicotine dependence, cigarettes, uncomplicated: Secondary | ICD-10-CM | POA: Diagnosis not present

## 2015-08-17 DIAGNOSIS — F411 Generalized anxiety disorder: Secondary | ICD-10-CM | POA: Diagnosis not present

## 2015-08-17 DIAGNOSIS — E221 Hyperprolactinemia: Secondary | ICD-10-CM | POA: Diagnosis present

## 2015-08-17 DIAGNOSIS — R4183 Borderline intellectual functioning: Secondary | ICD-10-CM

## 2015-08-17 DIAGNOSIS — F1211 Cannabis abuse, in remission: Secondary | ICD-10-CM | POA: Diagnosis present

## 2015-08-17 DIAGNOSIS — F200 Paranoid schizophrenia: Principal | ICD-10-CM

## 2015-08-17 MED ORDER — AMANTADINE HCL 100 MG PO CAPS
100.0000 mg | ORAL_CAPSULE | Freq: Two times a day (BID) | ORAL | Status: DC
  Administered 2015-08-17 – 2015-08-22 (×11): 100 mg via ORAL
  Filled 2015-08-17: qty 1
  Filled 2015-08-17 (×2): qty 14
  Filled 2015-08-17 (×12): qty 1

## 2015-08-17 MED ORDER — TRAZODONE HCL 100 MG PO TABS
100.0000 mg | ORAL_TABLET | Freq: Every evening | ORAL | Status: DC | PRN
  Administered 2015-08-17: 100 mg via ORAL
  Filled 2015-08-17: qty 1

## 2015-08-17 MED ORDER — ACETAMINOPHEN 325 MG PO TABS: 650.0000 mg | ORAL_TABLET | Freq: Four times a day (QID) | ORAL | Status: DC | PRN

## 2015-08-17 MED ORDER — PALIPERIDONE ER 6 MG PO TB24
9.0000 mg | ORAL_TABLET | Freq: Every day | ORAL | Status: DC
  Administered 2015-08-17: 9 mg via ORAL
  Filled 2015-08-17 (×2): qty 1

## 2015-08-17 MED ORDER — ALUM & MAG HYDROXIDE-SIMETH 200-200-20 MG/5ML PO SUSP: 30.0000 mL | ORAL | Status: DC | PRN

## 2015-08-17 MED ORDER — CARBAMAZEPINE 200 MG PO TABS
200.0000 mg | ORAL_TABLET | Freq: Two times a day (BID) | ORAL | Status: DC
  Administered 2015-08-17 – 2015-08-18 (×3): 200 mg via ORAL
  Filled 2015-08-17 (×5): qty 1

## 2015-08-17 MED ORDER — NICOTINE 14 MG/24HR TD PT24
14.0000 mg | MEDICATED_PATCH | Freq: Every day | TRANSDERMAL | Status: DC
  Administered 2015-08-19 – 2015-08-21 (×3): 14 mg via TRANSDERMAL
  Filled 2015-08-17 (×8): qty 1

## 2015-08-17 MED ORDER — MAGNESIUM HYDROXIDE 400 MG/5ML PO SUSP: 30.0000 mL | Freq: Every day | ORAL | Status: DC | PRN
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
## 2015-08-17 NOTE — Progress Notes (Signed)
Patient ID: [name] Love, male   DOB: 1995/11/19, 20 y.o.   MRN: [id number]    D: Pt has been very flat and depressed on the unit today. Pt was very isolative, he did not engage in treatment. Pt also remained very paranoid. Pt reported that he did not want his medications anymore because they were not helping. Pt did however take his medication today without any problems. Pt reported that his depression was a 6, his hopelessness was a 5, and his anxiety was a 4. Pt reported that his goal was to leave. Pt reported being negative SI/HI, no AH/VH noted. A: 15 min checks continued for patient safety. R: Pt safety maintained.
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
## 2015-08-17 NOTE — [facility] Group Notes (Signed)
[facility] Group Notes:  (Clinical Social Work)  08/17/2015  11:00AM-12:00PM  Summary of Progress/Problems:  The main focus of today's process group was to listen to a variety of genres of music and to identify that different types of music provoke different responses.  The patient then was able to identify personally what was soothing for them, as well as energizing, as well as how patient can personally use this knowledge in sleep habits, with depression, and with other symptoms.  The patient expressed at the beginning of group the overall feeling of "energized and exhausted."  He was in and out of the room, really did not participate or pay attention.  Type of Therapy:  Music Therapy   Participation Level:  Minimal  Participation Quality:  Distracting  Affect:  Blunted  Cognitive:  Disorganized  Insight:  Limited  Engagement in Therapy:  Poor  Modes of Intervention:   Activity, Exploration  Ambrose MantleMareida Grossman-Orr, LCSW 08/17/2015

## 2015-08-17 NOTE — ED Notes (Signed)
Pt transported to [facility] by [facility] transportation service for continuation of specialized care. Belongings given to driver after patient signed for them. Pt left in no acute distress. 

## 2015-08-17 NOTE — Tx Team (Signed)
Initial Interdisciplinary Treatment Plan   PATIENT STRESSORS: Marital or family conflict Medication change or noncompliance   PATIENT STRENGTHS: General fund of knowledge Supportive family/friends   PROBLEM LIST: Problem List/Patient Goals Date to be addressed Date deferred Reason deferred Estimated date of resolution  Risk for suicide 08/17/15     psychosis 08/17/15     aggression 08/17/15     "help with medications" 08/17/15                                    DISCHARGE CRITERIA:  Improved stabilization in mood, thinking, and/or behavior Verbal commitment to aftercare and medication compliance  PRELIMINARY DISCHARGE PLAN: Attend PHP/IOP Outpatient therapy  PATIENT/FAMIILY INVOLVEMENT: This treatment plan has been presented to and reviewed with the patient, Jamiel Chuang.  The patient and family have been given the opportunity to ask questions and make suggestions.  Jacques Navyhillips, Holleigh Crihfield A 08/17/2015, 5:55 AM
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
## 2015-08-18 MED ORDER — CARBAMAZEPINE 200 MG PO TABS
100.0000 mg | ORAL_TABLET | Freq: Two times a day (BID) | ORAL | Status: DC
  Filled 2015-08-18 (×2): qty 0.5

## 2015-08-18 MED ORDER — PALIPERIDONE ER 3 MG PO TB24
9.0000 mg | ORAL_TABLET | Freq: Every day | ORAL | Status: DC
  Filled 2015-08-18: qty 3

## 2015-08-18 MED ORDER — PALIPERIDONE PALMITATE 156 MG/ML IM SUSP
156.0000 mg | INTRAMUSCULAR | Status: DC
  Administered 2015-08-18: 156 mg via INTRAMUSCULAR
  Filled 2015-08-18: qty 1

## 2015-08-18 MED ORDER — DIVALPROEX SODIUM ER 250 MG PO TB24
750.0000 mg | ORAL_TABLET | Freq: Every day | ORAL | Status: DC
  Administered 2015-08-18 – 2015-08-21 (×4): 750 mg via ORAL
  Filled 2015-08-18 (×5): qty 3

## 2015-08-18 MED ORDER — TRAZODONE HCL 100 MG PO TABS
100.0000 mg | ORAL_TABLET | Freq: Every evening | ORAL | Status: DC | PRN
  Administered 2015-08-18 – 2015-08-21 (×5): 100 mg via ORAL
  Filled 2015-08-18: qty 1
  Filled 2015-08-18: qty 14
  Filled 2015-08-18 (×4): qty 1

## 2015-08-18 MED ORDER — HYDROXYZINE HCL 50 MG PO TABS
50.0000 mg | ORAL_TABLET | Freq: Four times a day (QID) | ORAL | Status: DC | PRN
  Administered 2015-08-18 – 2015-08-21 (×3): 50 mg via ORAL
  Filled 2015-08-18: qty 12
  Filled 2015-08-18 (×3): qty 1

## 2015-08-18 MED ORDER — CARBAMAZEPINE 100 MG PO CHEW
100.0000 mg | CHEWABLE_TABLET | Freq: Two times a day (BID) | ORAL | Status: AC
  Administered 2015-08-18 – 2015-08-20 (×4): 100 mg via ORAL
  Filled 2015-08-18 (×4): qty 1

## 2015-08-18 NOTE — Progress Notes (Signed)
D: Patient denies SI/HI and A/V hallucinations; patient rates anxiety and depression 0/10; patient rates hopelessness 8/10; patient had some complaints of anxiety  A: Monitored q 15 minutes; patient encouraged to attend groups; patient educated about medications; patient given medications per physician orders; patient encouraged to express feelings and/or concerns;   R: Patient is guarded and cautious; patient is cooperative and attending groups;  patient's interaction with staff and peers is minimal; patient avoids eye contact; patient has to be prompted for things, patient was able to set goal to talk with staff 1:1 when having feelings of SI; patient is taking medications as prescribed and tolerating medications; patient did some journaling in his room and patient reported that his anxiety has decreased

## 2015-08-18 NOTE — Progress Notes (Signed)
[facility] MD Progress Note  08/18/2015 1:04 PM [name] Love  MRN:  [id number] Subjective:  Patient states " I was angry ."     Objective:[name] Love is a 20 y.o. [name] , single , lives with mother in [place] , has a prior history of schizophrenia who presented to [facility] Emergency Department voluntarily brought in by [facility] for aggressive behavior.  Patient seen and chart reviewed.Discussed patient with treatment team.  Pt continues to be irritable , his thought process is concrete - he reports feeling angry at home and does not want to elaborate further about his behavior. He instead states he is going to "sell stuff:" - unknown if he meant that he wanted to support himself by doing so . Pt during the evaluation refused to answer writer's further questions and went inside the bathroom and locked it stating " I want to go # 2 ."  Per staff - pt is observed as irritable , irrelevant at times, paranoid and guarded often  , no disruptive issues were noted on the unit .He is compliant on medications .    Principal Problem: Schizophrenia, paranoid type ([place]) Diagnosis:   Patient Active Problem List   Diagnosis Date Noted  . Schizophrenia, paranoid type ([place]) [F20.0] 08/17/2015  . 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 Hyperprolactinemia ([place]) [E22.1]   . Schizophrenia, acute undifferentiated ([place]) [F20.3] 11/24/2014   Total Time spent with patient: 25 minutes  Past Psychiatric History: See Above  Past Medical History:  Past Medical History  Diagnosis Date  . Schizophrenia, acute ([place])    History reviewed. No pertinent past surgical history. Family History: Please see H&P.  Family History  Problem Relation Age of Onset  . Mental illness Cousin    Family Psychiatric  History: See Above Social History:  History  Alcohol Use No    Comment: "one time a month."     History  Drug Use No    Comment: per mother patient uses THC; patient denies THC; only  admits to alcohol use    Social History   Social History  . Marital Status: Single    Spouse Name: N/A  . Number of Children: N/A  . Years of Education: N/A   Social History Main Topics  . Smoking status: Current Every Day Smoker -- 0.25 packs/day for 4 years    Types: Cigarettes  . Smokeless tobacco: Never Used  . Alcohol Use: No     Comment: "one time a month."  . Drug Use: No     Comment: per mother patient uses THC; patient denies THC; only admits to alcohol use  . Sexual Activity: Not Currently   Other Topics Concern  . None   Social History Narrative   Additional Social History:               Sleep: Fair  Appetite:  Fair  Current Medications: Current Facility-Administered Medications  Medication Dose Route Frequency Provider Last Rate Last Dose  . acetaminophen (TYLENOL) tablet 650 mg  650 mg Oral Q6H PRN Patrecia Pour, NP      . alum & mag hydroxide-simeth (MAALOX/MYLANTA) 200-200-20 MG/5ML suspension 30 mL  30 mL Oral Q4H PRN Patrecia Pour, NP      . amantadine (SYMMETREL) capsule 100 mg  100 mg Oral BID Patrecia Pour, NP   100 mg at 08/18/15 0843  . carbamazepine (TEGRETOL) chewable tablet 100 mg  100 mg Oral BID PC Zyan Coby,  MD      . divalproex (DEPAKOTE ER) 24 hr tablet 750 mg  750 mg Oral QHS Shoichi Mielke, MD      . magnesium hydroxide (MILK OF MAGNESIA) suspension 30 mL  30 mL Oral Daily PRN Patrecia Pour, NP      . nicotine (NICODERM CQ - dosed in mg/24 hours) patch 14 mg  14 mg Transdermal Daily Patrecia Pour, NP   14 mg at 08/17/15 1059  . paliperidone (INVEGA SUSTENNA) injection 156 mg  156 mg Intramuscular Q28 days Ursula Alert, MD      . traZODone (DESYREL) tablet 100 mg  100 mg Oral QHS PRN Patrecia Pour, NP   100 mg at 08/17/15 2119    Lab Results:  Results for orders placed or performed during the hospital encounter of 08/16/15 (from the past 48 hour(s))  CBC with Differential/Platelet     Status: None   Collection Time:  08/16/15  2:04 PM  Result Value Ref Range   WBC 4.2 4.0 - 10.5 K/uL   RBC 4.64 4.22 - 5.81 MIL/uL   Hemoglobin 13.7 13.0 - 17.0 g/dL   HCT 40.9 39.0 - 52.0 %   MCV 88.1 78.0 - 100.0 fL   MCH 29.5 26.0 - 34.0 pg   MCHC 33.5 30.0 - 36.0 g/dL   RDW 13.0 11.5 - 15.5 %   Platelets 235 150 - 400 K/uL   Neutrophils Relative % 45 %   Neutro Abs 1.9 1.7 - 7.7 K/uL   Lymphocytes Relative 37 %   Lymphs Abs 1.5 0.7 - 4.0 K/uL   Monocytes Relative 11 %   Monocytes Absolute 0.4 0.1 - 1.0 K/uL   Eosinophils Relative 6 %   Eosinophils Absolute 0.3 0.0 - 0.7 K/uL   Basophils Relative 1 %   Basophils Absolute 0.0 0.0 - 0.1 K/uL  Comprehensive metabolic panel     Status: Abnormal   Collection Time: 08/16/15  2:04 PM  Result Value Ref Range   Sodium 138 135 - 145 mmol/L   Potassium 4.0 3.5 - 5.1 mmol/L   Chloride 104 101 - 111 mmol/L   CO2 28 22 - 32 mmol/L   Glucose, Bld 96 65 - 99 mg/dL   BUN 9 6 - 20 mg/dL   Creatinine, Ser 1.19 0.61 - 1.24 mg/dL   Calcium 9.1 8.9 - 10.3 mg/dL   Total Protein 7.8 6.5 - 8.1 g/dL   Albumin 4.5 3.5 - 5.0 g/dL   AST 22 15 - 41 U/L   ALT 24 17 - 63 U/L   Alkaline Phosphatase 80 38 - 126 U/L   Total Bilirubin 0.2 (L) 0.3 - 1.2 mg/dL   GFR calc non Af Amer >60 >60 mL/min   GFR calc Af Amer >60 >60 mL/min    Comment: (NOTE) The eGFR has been calculated using the CKD EPI equation. This calculation has not been validated in all clinical situations. eGFR's persistently <60 mL/min signify possible Chronic Kidney Disease.    Anion gap 6 5 - 15  Carbamazepine level, total     Status: None   Collection Time: 08/16/15  2:04 PM  Result Value Ref Range   Carbamazepine Lvl 10.0 4.0 - 12.0 ug/mL    Comment: Performed at [facility]  Ethanol     Status: None   Collection Time: 08/16/15  2:05 PM  Result Value Ref Range   Alcohol, Ethyl (B) <5 <5 mg/dL    Comment:  LOWEST DETECTABLE LIMIT FOR SERUM ALCOHOL IS 5 mg/dL FOR MEDICAL PURPOSES ONLY    Urine rapid drug screen (hosp performed)     Status: None   Collection Time: 08/16/15  2:19 PM  Result Value Ref Range   Opiates NONE DETECTED NONE DETECTED   Cocaine NONE DETECTED NONE DETECTED   Benzodiazepines NONE DETECTED NONE DETECTED   Amphetamines NONE DETECTED NONE DETECTED   Tetrahydrocannabinol NONE DETECTED NONE DETECTED   Barbiturates NONE DETECTED NONE DETECTED    Comment:        DRUG SCREEN FOR MEDICAL PURPOSES ONLY.  IF CONFIRMATION IS NEEDED FOR ANY PURPOSE, NOTIFY LAB WITHIN 5 DAYS.        LOWEST DETECTABLE LIMITS FOR URINE DRUG SCREEN Drug Class       Cutoff (ng/mL) Amphetamine      1000 Barbiturate      200 Benzodiazepine   280 Tricyclics       034 Opiates          300 Cocaine          300 THC              50     Blood Alcohol level:  Lab Results  Component Value Date   ETH <5 08/16/2015   ETH <5 08/06/2015    Physical Findings: AIMS:  , ,  ,  ,    CIWA:    COWS:     Musculoskeletal: Strength & Muscle Tone: within normal limits Gait & Station: normal Patient leans: N/A  Psychiatric Specialty Exam: Physical Exam  Nursing note and vitals reviewed. Constitutional: He is oriented to person, place, and time. He appears well-developed.  Cardiovascular: Normal rate.   Neurological: He is oriented to person, place, and time.  Psychiatric: He has a normal mood and affect. His behavior is normal.    Review of Systems  Psychiatric/Behavioral: The patient is nervous/anxious.   All other systems reviewed and are negative.   Blood pressure 127/69, pulse 99, temperature 98.1 F (36.7 C), temperature source Oral, resp. rate 20, height 5' 10"  (1.778 m), weight 97.977 kg (216 lb).Body mass index is 30.99 kg/(m^2).  General Appearance: Guarded  Eye Contact:  Minimal  Speech:  Normal Rate but limited  Volume:  Normal  Mood:  Anxious and Irritable   Affect:  Congruent  Thought Process:  Irrelevant and Descriptions of Associations: Circumstantial   Orientation:  Other:  Person and place  Thought Content:  Delusions, Paranoid Ideation and Rumination   Suicidal Thoughts:  No  Homicidal Thoughts:  No is paranoid   Memory:  Immediate;   Fair Recent;   Fair Remote;   Fair  Judgement:  Poor  Insight:  Lacking  Psychomotor Activity:  Normal  Concentration:  Concentration: Fair  Recall:  [employer] of Knowledge:  Fair  Language:  Good  Akathisia:  No  Handed:  Right  AIMS (if indicated):     Assets:  Desire for Improvement Resilience  ADL's:  Intact  Cognition:  WNL  Sleep:  Number of Hours: 4.25    Treatment Plan Summary:Tywaun Radwan is a 20 y.o. [name] , single , lives with mother in [place] , has a prior history of schizophrenia who presented to [facility] Department voluntarily brought in by [facility] for aggressive behavior .Pt will benefit from medication readjustment and continued treatment.  Daily contact with patient to assess and evaluate symptoms and progress in treatment and Medication management   Will continue  Invega sustenna 156 mg IM -  08/18/15 - repeat q28 days. Will cross titrate Tegretol with Depakote . Start Depakote ER 750 mg po qhs for mood sx. Tegretol level - 10 ( 08/16/15) - Depakote level in 5 days- 08/22/15. Recreational therapy consult. Continue PRN medications as per agitation protocol. Continue to monitor vitals ,medication compliance and treatment side effects while patient is here.  Will monitor for medical issues as well as call consult as needed.  CSW will continue working on disposition.  Recreational therapy consult. Patient to participate in therapeutic milieu .   Rane Dumm, MD  08/18/2015, 1:04 PM

## 2015-08-18 NOTE — [facility] Group Notes (Signed)
[facility] LCSW Group Therapy  08/18/2015 1:15 pm  Type of Therapy: Process Group Therapy  Participation Level:  Active  Participation Quality:  Appropriate  Affect:  Flat  Cognitive:  Oriented  Insight:  Improving  Engagement in Group:  Limited  Engagement in Therapy:  Limited  Modes of Intervention:  Activity, Clarification, Education, Problem-solving and Support  Summary of Progress/Problems: Today's group addressed the issue of overcoming obstacles.  Patients were asked to identify their biggest obstacle post d/c that stands in the way of their on-going success, and then problem solve as to how to manage this.  Came initially.  Left after 5 minutes and did not return.  [name] Love, Tericka Devincenzi B 08/18/2015   4:51 PM
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
## 2015-08-19 NOTE — Progress Notes (Signed)
Adult Psychoeducational Group Note  Date:  08/19/2015 Time:  8:45 PM  Group Topic/Focus:  Wrap-Up Group:   The focus of this group is to help patients review their daily goal of treatment and discuss progress on daily workbooks.  Participation Level:  Did Not Attend  Participation Quality:  Did not attend  Affect:  Did not attend  Cognitive:  Did not attend  Insight: None  Engagement in Group:  Did not attend  Modes of Intervention:  Did not attend  Additional Comments:  Patient did not attend wrap up group tonight.   Selyna Klahn L Vineta Carone 08/19/2015, 8:45 PM

## 2015-08-19 NOTE — BHH Group Notes (Signed)
[facility] LCSW Group Therapy  08/19/15   11:15 AM   Type of Therapy:  Group Therapy  Participation Level:  Minimal  Participation Quality:  Limited  Affect: Flat  Cognitive:  Alert  Insight:  Developing/Improving  Engagement in Therapy:  Developing/Improving   Modes of Intervention:  Clarification, Confrontation, Discussion, Education, Exploration, Limit-setting, Orientation, Problem-solving, Rapport Building, Dance movement psychotherapisteality Testing, Socialization and Support  Summary of Progress/Problems: The topic for group therapy was feelings about diagnosis.  Pt actively participated in group discussion on their past and current diagnosis and how they feel towards this.  Pt also identified how society and family members judge them, based on their diagnosis as well as stereotypes and stigmas.  Patient stated his initial mental health diagnosis made him feel "like I was different - I was not the Andranik I used to be."  Felt positive about recent contacts w his mother and aunt, hopeful that his situation will improve.    Santa GeneraAnne Vanya Carberry, LCSW Lead Clinical Social Worker Phone:  [phone]

## 2015-08-19 NOTE — Tx Team (Signed)
Interdisciplinary Treatment Plan Update (Adult)  Date:  08/19/2015 Time Reviewed:  3:09 PM  Progress in Treatment: Attending groups: Yes. Participating in groups:  No  Minimal interaction Taking medication as prescribed:  Yes. Tolerating medication:  Yes. Family/Significant othe contact made:  Yes, individual(s) contacted:  mother Patient understands diagnosis:  No  Limited insight Discussing patient identified problems/goals with staff:  Yes. Medical problems stabilized or resolved:  Yes. Denies suicidal/homicidal ideation: Yes. Issues/concerns per patient self-inventory:  Yes. Other:  New problem(s) identified: No, Describe:  NA  Discharge Plan or Barriers: see below   Reason for Continuation of Hospitalization: Hallucinations Medication stabilization  Comments: [name] Love is a 20 y.o. [name] , single , lives with mother in [place] , has a prior history of schizophrenia who presented to [facility] Department voluntarily brought in by [facility] for aggressive behavior .Pt continues to be vaguely irritable and paranoid.  Will continue Invega sustenna 156 mg IM - 08/18/15 - repeat q28 days. Will cross titrate Tegretol with Depakote . Started Depakote ER 750 mg po qhs for mood sx. Tegretol level - 10 ( 08/16/15) - Depakote level in 5 days- 08/22/15. Recreational therapy consult. Recreational therapy consult.   Estimated length of stay: 4-5 days  New goal(s): NA  Review of initial/current patient goals per problem list:   1.  Goal(s): Patient will participate in aftercare plan * Met: No * Target date: at discharge * As evidenced by: Patient will participate within aftercare plan AEB aftercare provider and housing plan at discharge being identified. * 08/19/15:  Mother states patient cannot return home.  CSW talking to her about other options, including family or shelter.  Will also alert ACT team that he is here.  2.  Goal (s): Patient will demonstrate decreased symptoms of  psychosis. * Met: No  *  Target date: at discharge * As evidenced by: Patient will not endorse signs of psychosis or be deemed stable for discharge by MD.  * 08/19/15:  Presents as disorganized, irritable, paranoid  Attendees: [name] 6/6/20173:09 PM  Family:   6/6/20173:09 PM  Physician: Ursula Alert  6/6/20173:09 PM  Nurse: Hedy Jacob  6/6/20173:09 PM  Case Manager:   6/6/20173:09 PM  Counselor:   6/6/20173:09 PM  Other:  Merri Brunette 6/6/20173:09 PM  Other:  6/6/20173:09 PM                           Scribe for Treatment Team:   Roque Lias B,MSW,   08/19/2015, 3:09 PM

## 2015-08-19 NOTE — BHH Group Notes (Signed)
[facility]HH Group Notes:  (Nursing/MHT/Case Management/Adjunct)  Date:  08/19/2015  Time:  12:26 PM  Type of Therapy:  Nurse Education  Participation Level:  Active  Participation Quality:  Appropriate and Attentive  Affect:  Appropriate  Cognitive:  Alert and Appropriate  Insight:  Appropriate and Good  Engagement in Group:  Engaged  Modes of Intervention:  Discussion and Education  Summary of Progress/Problems: Topic was on recovery. Group discussed what recovery means to them. Group encouraged to stay focus and to surround themselves with a good support system that will be there to guide them through the recovery process. Patient was receptive and attentive.  [name] Love 08/19/2015, 12:26 PM

## 2015-08-19 NOTE — BHH Counselor (Signed)
PSA attempt w patient, patient declined to meet w CSW.  Did state he wants resources for homeless shelters, wants to meet w [facility] worker, referral made.  Santa GeneraAnne Lewis Keats, LCSW Lead Clinical Social Worker Phone:  [phone]

## 2015-08-19 NOTE — Progress Notes (Signed)
DAR NOTE: Patient presents with flat affect and depressed mood.  Denies pain, auditory and visual hallucinations.  Rates depression at 3, hopelessness at 2, and anxiety at 1.  Describes energy level as normal and concentration as good.  Maintained on routine safety checks.  Medications given as prescribed.  Support and encouragement offered as needed.  Attended group and participated.  States goal for today is "concentration."  Patient observed socializing with peers in the dayroom.  Offered no complaint.

## 2015-08-19 NOTE — Progress Notes (Signed)
Recreation Therapy Notes  Animal-Assisted Activity (AAA) Program Checklist/Progress Notes Patient Eligibility Criteria Checklist & Daily Group note for Rec Tx Intervention  Date: 06.06.2017 Time: 2:45pm [address]    AAA/T Program Assumption of Risk Form signed by Patient/ or Parent Legal Guardian No  Behavioral Response: Did not attend.   Clinical Observations/Feedback: MD consulted for patient appropriateness in session, LRT and MD in agreement patient is appropriate for session. Session explained to patient and patient acknowledged explanation. At this time patient was asked to sign necessary consent form, patient declined to sign form and recognized that this meant he would be unable to attend session.   Marykay Lexenise L Osbaldo Mark, LRT/CTRS        Adylynn Hertenstein L 08/19/2015 3:04 PM

## 2015-08-19 NOTE — Progress Notes (Signed)
Pt continues to be very paranoid

## 2015-08-19 NOTE — Progress Notes (Signed)
[facility] [name] Love Progress Note  08/19/2015 2:21 PM [name] Love  MRN:  [id number] Subjective:  Patient states " I will shut the door."      Objective:[name] Love is a 20 y.o. [name] , single , lives with mother in [place] , has a prior history of schizophrenia who presented to [facility] Emergency Department voluntarily brought in by [facility] for aggressive behavior.  Patient seen and chart reviewed.Discussed patient with treatment team.  Pt continues to be vaguely  irritable  And has paranoia . He is very concrete and his response to questions are usually in monosyllables . Pt lacks insight in to his illness as well as his actions . Pt per staff has periods when he is paranoid and requires redirection on the unit.     Principal Problem: Schizophrenia, paranoid type (HCC) Diagnosis:   Patient Active Problem List   Diagnosis Date Noted  . 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 Schizophrenia, acute (HCC)    History reviewed. No pertinent past surgical history. Family History: Please see H&P.  Family History  Problem Relation Age of Onset  . Mental illness Cousin    Family Psychiatric  History: See Above Social History:  History  Alcohol Use No    Comment: "one time a month."     History  Drug Use No    Comment: per mother patient uses THC; patient denies THC; only admits to alcohol use    Social History   Social History  . Marital Status: Single    Spouse Name: N/A  . Number of Children: N/A  . Years of Education: N/A   Social History Main Topics  . Smoking status: Current Every Day Smoker -- 0.25 packs/day for 4 years    Types: Cigarettes  .  Smokeless tobacco: Never Used  . Alcohol Use: No     Comment: "one time a month."  . Drug Use: No     Comment: per mother patient uses THC; patient denies THC; only admits to alcohol use  . Sexual Activity: Not Currently   Other Topics Concern  . None   Social History Narrative   Additional Social History:               Sleep: Fair  Appetite:  Fair  Current Medications: Current Facility-Administered Medications  Medication Dose Route Frequency Provider Last Rate Last Dose  . acetaminophen (TYLENOL) tablet 650 mg  650 mg Oral Q6H PRN Charm Rings, NP      . alum & mag hydroxide-simeth (MAALOX/MYLANTA) 200-200-20 MG/5ML suspension 30 mL  30 mL Oral Q4H PRN Charm Rings, NP      . amantadine (SYMMETREL) capsule 100 mg  100 mg Oral BID Charm Rings, NP   100 mg at 08/19/15 4098  . carbamazepine (TEGRETOL) chewable tablet 100 mg  100 mg Oral BID PC Forrestine Lecrone, [name] Love   100 mg at 08/19/15 0839  . divalproex (DEPAKOTE ER) 24 hr tablet 750 mg  750 mg Oral QHS [name] Conran, [name] Love   750 mg at 08/18/15 2216  . hydrOXYzine (ATARAX/VISTARIL) tablet 50 mg  50 mg Oral Q6H PRN Truman Hayward, FNP   50 mg at  08/18/15 2217  . magnesium hydroxide (MILK OF MAGNESIA) suspension 30 mL  30 mL Oral Daily PRN Charm Rings, NP      . nicotine (NICODERM CQ - dosed in mg/24 hours) patch 14 mg  14 mg Transdermal Daily Charm Rings, NP   14 mg at 08/19/15 0837  . paliperidone (INVEGA SUSTENNA) injection 156 mg  156 mg Intramuscular Q28 days Jomarie Longs, [name] Love   156 mg at 08/18/15 1321  . traZODone (DESYREL) tablet 100 mg  100 mg Oral QHS PRN,MR X 1 Truman Hayward, FNP   100 mg at 08/18/15 2217    Lab Results:  No results found for this or any previous visit (from the past 48 hour(s)).  Blood Alcohol level:  Lab Results  Component Value Date   ETH <5 08/16/2015   ETH <5 08/06/2015    Physical Findings: AIMS: Facial and Oral Movements Muscles of Facial Expression: None, normal Lips and  Perioral Area: None, normal Jaw: None, normal Tongue: None, normal,Extremity Movements Upper (arms, wrists, hands, fingers): None, normal Lower (legs, knees, ankles, toes): None, normal, Trunk Movements Neck, shoulders, hips: None, normal, Overall Severity Severity of abnormal movements (highest score from questions above): None, normal Incapacitation due to abnormal movements: None, normal Patient's awareness of abnormal movements (rate only patient's report): No Awareness, Dental Status Current problems with teeth and/or dentures?: No Does patient usually wear dentures?: No  CIWA:    COWS:     Musculoskeletal: Strength & Muscle Tone: within normal limits Gait & Station: normal Patient leans: N/A  Psychiatric Specialty Exam: Physical Exam  Nursing note and vitals reviewed. Constitutional: He is oriented to person, place, and time. He appears well-developed.  Cardiovascular: Normal rate.   Neurological: He is oriented to person, place, and time.  Psychiatric: He has a normal mood and affect. His behavior is normal.    Review of Systems  Psychiatric/Behavioral: The patient is nervous/anxious.   All other systems reviewed and are negative.   Blood pressure 127/69, pulse 99, temperature 98.1 F (36.7 C), temperature source Oral, resp. rate 20, height 5\' 10"  (1.778 m), weight 97.977 kg (216 lb).Body mass index is 30.99 kg/(m^2).  General Appearance: Guarded  Eye Contact:  Minimal  Speech:  Normal Rate but limited  Volume:  Normal  Mood:  Anxious and Irritable   Affect:  Congruent  Thought Process:  Irrelevant and Descriptions of Associations: Circumstantial  Orientation:  Other:  Person and place  Thought Content:  Delusions, Paranoid Ideation and Rumination   Suicidal Thoughts:  No  Homicidal Thoughts:  No is paranoid   Memory:  Immediate;   Fair Recent;   Fair Remote;   Fair  Judgement:  Poor  Insight:  Lacking  Psychomotor Activity:  Normal  Concentration:   Concentration: Fair  Recall:  [employer] of Knowledge:  Fair  Language:  Good  Akathisia:  No  Handed:  Right  AIMS (if indicated):     Assets:  Desire for Improvement Resilience  ADL's:  Intact  Cognition:  WNL  Sleep:  Number of Hours: 5    Treatment Plan Summary:[name] Love is a 20 y.o. [name] , single , lives with mother in [place] , has a prior history of schizophrenia who presented to [facility] voluntarily brought in by [facility] for aggressive behavior .Pt continues to be vaguely irritable and paranoid.  Daily contact with patient to assess and evaluate symptoms and progress in treatment and Medication management  Will continue Invega sustenna 156 mg IM -  08/18/15 - repeat q28 days. Will cross titrate Tegretol with Depakote . Started Depakote ER 750 mg po qhs for mood sx. Tegretol level - 10 ( 08/16/15) - Depakote level in 5 days- 08/22/15. Recreational therapy consult. Continue PRN medications as per agitation protocol. Continue to monitor vitals ,medication compliance and treatment side effects while patient is here.  Will monitor for medical issues as well as call consult as needed.  CSW will continue working on disposition.  Recreational therapy consult. Patient to participate in therapeutic milieu .   [name] Coots, [name] Love  08/19/2015, 2:21 PM

## 2015-08-20 MED ORDER — ARIPIPRAZOLE 5 MG PO TABS
5.0000 mg | ORAL_TABLET | Freq: Every day | ORAL | Status: DC
  Administered 2015-08-20 – 2015-08-21 (×2): 5 mg via ORAL
  Filled 2015-08-20: qty 1
  Filled 2015-08-20: qty 7
  Filled 2015-08-20 (×2): qty 1

## 2015-08-20 NOTE — [facility] Group Notes (Signed)
[facility]   08/20/2015 10:30 AM  Participation Quality:  Minimal  Mood/Affect:  Flat  Depression Rating:    Anxiety Rating:    Thoughts of Suicide:  No Will you contract for safety?   NA  Current AVH:  Yes  Plan for Discharge/Comments:  States he is doing fine today, but endorsed psychosis with nurse earlier today.  [facility] Recanted and said he is to call them when he discharges, and [facility].  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  [employer]:   Supports:  Daryel GeraldNorth, Kambre Messner B

## 2015-08-20 NOTE — Progress Notes (Signed)
[facility] MD Progress Note  08/20/2015 2:26 PM [name] Love  MRN:  [id number] Subjective:  Patient states " I want to go and get my medications.'       Objective:[name] Love is a 20 y.o. [name] , single , lives with mother in [place] , has a prior history of schizophrenia who presented to [facility] Department voluntarily brought in by [facility] for aggressive behavior.  Patient seen and chart reviewed.Discussed patient with treatment team.  Pt continues to be irritable this AM , is resistant to evaluation. Pt reports he is fine , walked away from Clinical research associate when Clinical research associate attempted to talk to him. Per staff - he continues to be paranoid and irritable often. Pt with concrete thought process , very limited interaction on the unit . He has had no disruptive issues noted on the unit - however per staff he appears irritable all the time . Continues to require encouragement and support.      Principal Problem: Schizophrenia, paranoid type (HCC) Diagnosis:   Patient Active Problem List   Diagnosis Date Noted  . 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 Schizophrenia, acute (HCC)    History reviewed. No pertinent past surgical history. Family History: Please see H&P.  Family History  Problem Relation Age of Onset  . Mental illness Cousin    Family Psychiatric  History: See Above Social History:  History  Alcohol Use No    Comment: "one time a month."     History  Drug Use No    Comment: per mother patient uses THC; patient denies THC; only admits to alcohol use    Social History   Social History  . Marital Status: Single    Spouse Name: N/A  . Number  of Children: N/A  . Years of Education: N/A   Social History Main Topics  . Smoking status: Current Every Day Smoker -- 0.25 packs/day for 4 years    Types: Cigarettes  . Smokeless tobacco: Never Used  . Alcohol Use: No     Comment: "one time a month."  . Drug Use: No     Comment: per mother patient uses THC; patient denies THC; only admits to alcohol use  . Sexual Activity: Not Currently   Other Topics Concern  . None   Social History Narrative   Additional Social History:               Sleep: Fair  Appetite:  Fair  Current Medications: Current Facility-Administered Medications  Medication Dose Route Frequency Provider Last Rate Last Dose  . acetaminophen (TYLENOL) tablet 650 mg  650 mg Oral Q6H PRN [name] Rings, NP      . alum & mag hydroxide-simeth (MAALOX/MYLANTA) 200-200-20 MG/5ML suspension 30 mL  30 mL Oral Q4H PRN [name] Rings, NP      . amantadine (SYMMETREL) capsule 100 mg  100 mg Oral BID [name] Rings, NP   100 mg at 08/20/15 0845  . ARIPiprazole (ABILIFY) tablet 5 mg  5 mg Oral QHS [name] Kronick, MD      . divalproex (DEPAKOTE ER) 24 hr tablet 750 mg  750 mg Oral QHS [name] Longs, MD   750  mg at 08/19/15 2153  . hydrOXYzine (ATARAX/VISTARIL) tablet 50 mg  50 mg Oral Q6H PRN [name] Haywardakia S Starkes, FNP   50 mg at 08/18/15 2217  . magnesium hydroxide (MILK OF MAGNESIA) suspension 30 mL  30 mL Oral Daily PRN [name] RingsJamison Y Lord, NP      . nicotine (NICODERM CQ - dosed in mg/24 hours) patch 14 mg  14 mg Transdermal Daily [name] RingsJamison Y Lord, NP   14 mg at 08/20/15 0844  . paliperidone (INVEGA SUSTENNA) injection 156 mg  156 mg Intramuscular Q28 days [name] LongsSaramma Solange Emry, MD   156 mg at 08/18/15 1321  . traZODone (DESYREL) tablet 100 mg  100 mg Oral QHS PRN,MR X 1 [name] Haywardakia S Starkes, FNP   100 mg at 08/19/15 2153    Lab Results:  No results found for this or any previous visit (from the past 48 hour(s)).  Blood Alcohol level:  Lab Results  Component Value Date   ETH <5  08/16/2015   ETH <5 08/06/2015    Physical Findings: AIMS: Facial and Oral Movements Muscles of Facial Expression: None, normal Lips and Perioral Area: None, normal Jaw: None, normal Tongue: None, normal,Extremity Movements Upper (arms, wrists, hands, fingers): None, normal Lower (legs, knees, ankles, toes): None, normal, Trunk Movements Neck, shoulders, hips: None, normal, Overall Severity Severity of abnormal movements (highest score from questions above): None, normal Incapacitation due to abnormal movements: None, normal Patient's awareness of abnormal movements (rate only patient's report): No Awareness, Dental Status Current problems with teeth and/or dentures?: No Does patient usually wear dentures?: No  CIWA:    COWS:     Musculoskeletal: Strength & Muscle Tone: within normal limits Gait & Station: normal Patient leans: N/A  Psychiatric Specialty Exam: Physical Exam  Nursing note and vitals reviewed. Constitutional: He is oriented to person, place, and time. He appears well-developed.  Cardiovascular: Normal rate.   Neurological: He is oriented to person, place, and time.  Psychiatric: He has a normal mood and affect. His behavior is normal.    Review of Systems  Unable to perform ROS: mental acuity    Blood pressure 115/72, pulse 101, temperature 98 F (36.7 C), temperature source Oral, resp. rate 18, height 5\' 10"  (1.778 m), weight 97.977 kg (216 lb).Body mass index is 30.99 kg/(m^2).  General Appearance: Guarded  Eye Contact:  Minimal  Speech:  normal rate but limited  Volume:  Normal  Mood:  Anxious and Irritable   Affect:  Congruent  Thought Process:  Irrelevant and Descriptions of Associations: Circumstantial  Orientation:  Other:  Person and place  Thought Content:  Delusions, Paranoid Ideation and Rumination   Suicidal Thoughts:  No  Homicidal Thoughts:  No is paranoid - can be a danger to self or others - has extensive hx of aggression  Memory:   Immediate;   Fair Recent;   Fair Remote;   Fair  Judgement:  Poor  Insight:  Lacking  Psychomotor Activity:  Normal  Concentration:  Concentration: Fair  Recall:  [name] Love  Fund of Knowledge:  Fair  Language:  Good  Akathisia:  No  Handed:  Right  AIMS (if indicated):     Assets:  Desire for Improvement Resilience  ADL's:  Intact  Cognition:  WNL  Sleep:  Number of Hours: 6.75    Treatment Plan Summary:Graysin Wymore is a 20 y.o. [name] , single , [place] , has a prior history of schizophrenia who [facility]  brought in by [facility] for aggressive behavior .Pt continues to be vaguely irritable and paranoid.  Daily contact with patient to assess and evaluate symptoms and progress in treatment and Medication management   Will continue Invega sustenna 156 mg IM -  08/18/15 - repeat q28 days. Will add Abilify 5 mg po qhs for augmenting the effect of the LAI. Will cross titrate Tegretol with Depakote . Continue Depakote ER 750 mg po qhs for mood sx. Tegretol level - 10 ( 08/16/15) - Depakote level in 5 days- 08/22/15. Recreational therapy consult. Continue PRN medications as per agitation protocol. Continue to monitor vitals ,medication compliance and treatment side effects while patient is here.  Will monitor for medical issues as well as call consult as needed.  Reviewed labs - Pl level pending. CSW will continue working on disposition.  Recreational therapy consult. Patient to participate in therapeutic milieu .   [name] Finks, MD  08/20/2015, 2:26 PM

## 2015-08-20 NOTE — Progress Notes (Signed)
   D: Pt lay in bed during the assessment. Writer asked the pt to come to the window to get his medications. Pt stated, "ok", but didn't get out of bed. Writer took pt's meds to his room. Asked pt about his day, stated, "ok". Pt denied having any questions or concerns.   A:  Support and encouragement was offered. 15 min checks continued for safety.  R: Pt remains safe.

## 2015-08-20 NOTE — Progress Notes (Signed)
Pt endorses racing thoughts, pt seen interacting on unit with peers, playing cards. Pt continues to be paranoid, but pt appropriate on unit.

## 2015-08-20 NOTE — [facility] Group Notes (Signed)
[facility]  08/20/2015 , 1:28 PM    Type of Therapy:  [employer] Presentation  Participation Level:  Active  Participation Quality:  Attentive  Affect:  Blunted  Cognitive:  Oriented  Insight:  Limited  Engagement in Therapy:  Engaged  Modes of Intervention:  Discussion, Education and Socialization  Summary of Progress/Problems:  [name] Love from [employer] came to present his recovery story and play the guitar.  "I'll come later."   Showed up about half way through.  Left and did not return.  [name] Love, [name] Love B 08/20/2015 , 1:28 PM

## 2015-08-20 NOTE — Progress Notes (Signed)
Patient attended group and stated that his goal was to work on going to groups everyday. Patient says that he has been doing good with accomplishing that goal.

## 2015-08-20 NOTE — Progress Notes (Signed)
D-  Patient has been isolative this shift.  Patient reported an increase in auditory hallucinations, but no visual hallucinations.  Patient stated that he has been having anxiety 4/10 due to the increased voices.    A- Assess patient for safety, offer medications as prescribed, engage patient in 1:1 staff talks.   R- Patient able to contract for safety.

## 2015-08-21 LAB — PROLACTIN: PROLACTIN: 57.5 ng/mL — AB (ref 4.0–15.2)

## 2015-08-21 NOTE — Progress Notes (Signed)
[facility]  08/21/2015 11:27 AM [name] Love  MRN:  [id number] Subjective:  Patient states " I feel angry.'        Objective:[name] Love is a 20 y.o. [name] , single , [place] , has a prior history of schizophrenia who [facility].  Patient seen and chart reviewed.Discussed patient with treatment team.  Pt continues to be irritable on and off , however is more redirectable . He was able to participate in evaluation process this AM . Pt continues to report anger issues . Discussed coping techniques as well as readjusting his medications. Pt with concrete thought process , very limited interaction on the unit . He has had no disruptive issues noted on the unit . Continues to require encouragement and support.      Principal Problem: Schizophrenia, paranoid type (HCC) Diagnosis:   Patient Active Problem List   Diagnosis Date Noted  . 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 Schizophrenia, acute (HCC)    History reviewed. No pertinent past surgical history. Family History: Please see H&P.  Family History  Problem Relation Age of Onset  . Mental illness Cousin    Family Psychiatric  History: See Above Social History:  History  Alcohol Use No    Comment: "one time a month."     History  Drug Use No    Comment: per mother patient uses THC; patient denies THC; only admits to alcohol use    Social History   Social History  . Marital Status: Single    Spouse Name: N/A  . Number of Children: N/A  . Years of Education: N/A    Social History Main Topics  . Smoking status: Current Every Day Smoker -- 0.25 packs/day for 4 years    Types: Cigarettes  . Smokeless tobacco: Never Used  . Alcohol Use: No     Comment: "one time a month."  . Drug Use: No     Comment: per mother patient uses THC; patient denies THC; only admits to alcohol use  . Sexual Activity: Not Currently   Other Topics Concern  . None   Social History Narrative   Additional Social History:               Sleep: Fair  Appetite:  Fair  Current Medications: Current Facility-Administered Medications  Medication Dose Route Frequency Provider Last Rate Last Dose  . acetaminophen (TYLENOL) tablet 650 mg  650 mg Oral Q6H PRN Charm RingsJamison Y Lord, NP      . alum & mag hydroxide-simeth (MAALOX/MYLANTA) 200-200-20 MG/5ML suspension 30 mL  30 mL Oral Q4H PRN Charm RingsJamison Y Lord, NP      . amantadine (SYMMETREL) capsule 100 mg  100 mg Oral BID Charm RingsJamison Y Lord, NP   100 mg at 08/21/15 0758  . ARIPiprazole (ABILIFY) tablet 5 mg  5 mg Oral QHS Riyanshi Wahab, MD   5 mg at 08/20/15 2200  . divalproex (DEPAKOTE ER) 24 hr tablet 750 mg  750 mg Oral QHS Anjeli Casad, MD   750 mg at 08/20/15 2200  . hydrOXYzine (ATARAX/VISTARIL) tablet 50  mg  50 mg Oral Q6H PRN Truman Hayward, FNP   50 mg at 08/20/15 2357  . magnesium hydroxide (MILK OF MAGNESIA) suspension 30 mL  30 mL Oral Daily PRN Charm Rings, NP      . nicotine (NICODERM CQ - dosed in mg/24 hours) patch 14 mg  14 mg Transdermal Daily Charm Rings, NP   14 mg at 08/21/15 0757  . paliperidone (INVEGA SUSTENNA) injection 156 mg  156 mg Intramuscular Q28 days Jomarie Longs, MD   156 mg at 08/18/15 1321  . traZODone (DESYREL) tablet 100 mg  100 mg Oral QHS PRN,MR X 1 Truman Hayward, FNP   100 mg at 08/21/15 1308    Lab Results:  Results for orders placed or performed during the hospital encounter of 08/17/15 (from the past 48 hour(s))  Prolactin     Status: Abnormal   Collection Time: 08/20/15  6:15 AM   Result Value Ref Range   Prolactin 57.5 (H) 4.0 - 15.2 ng/mL    Comment: (NOTE) Performed At: [facility] [address] [place], [place] [id number] Mila Homer MD [id number] Performed at [facility]     Blood Alcohol level:  Lab Results  Component Value Date   [facility] <5 08/16/2015   ETH <5 08/06/2015    Physical Findings: AIMS: Facial and Oral Movements Muscles of Facial Expression: None, normal Lips and Perioral Area: None, normal Jaw: None, normal Tongue: None, normal,Extremity Movements Upper (arms, wrists, hands, fingers): None, normal Lower (legs, knees, ankles, toes): None, normal, Trunk Movements Neck, shoulders, hips: None, normal, Overall Severity Severity of abnormal movements (highest score from questions above): None, normal Incapacitation due to abnormal movements: None, normal Patient's awareness of abnormal movements (rate only patient's report): No Awareness, Dental Status Current problems with teeth and/or dentures?: No Does patient usually wear dentures?: No  CIWA:    COWS:     Musculoskeletal: Strength & Muscle Tone: within normal limits Gait & Station: normal Patient leans: N/A  Psychiatric Specialty Exam: Physical Exam  Nursing note and vitals reviewed. Constitutional: He is oriented to person, place, and time. He appears well-developed.  Cardiovascular: Normal rate.   Neurological: He is oriented to person, place, and time.  Psychiatric: He has a normal mood and affect. His behavior is normal.    Review of Systems  Psychiatric/Behavioral: Positive for substance abuse. The patient is nervous/anxious.   All other systems reviewed and are negative.   Blood pressure 129/68, pulse 150, temperature 98.3 F (36.8 C), temperature source Oral, resp. rate 20, height  (1.778 m), weight 97.977 kg (216 lb).Body mass index is 30.99 kg/(m^2).  General Appearance: Guarded  Eye Contact:  Minimal  Speech:  normal  rate but limited  Volume:  Normal  Mood:  Anxious and Irritable   Affect:  Congruent  Thought Process:  Goal Directed and Descriptions of Associations: Circumstantial  Orientation:  Other:  Person and place  Thought Content:  Delusions, Paranoid Ideation and Rumination improving  Suicidal Thoughts:  No  Homicidal Thoughts:  No is paranoid - can be a danger to self or others - has extensive hx of aggression- improving   Memory:  Immediate;   Fair Recent;   Fair Remote;   Fair  Judgement:  Poor  Insight:  Lacking  Psychomotor Activity:  Normal  Concentration:  Concentration: Fair  Recall:  [employer] of Knowledge:  Fair  Language:  Good  Akathisia:  No  Handed:  Right  AIMS (if indicated):     Assets:  Desire for Improvement Resilience  ADL's:  Intact  Cognition:  WNL  Sleep:  Number of Hours: 6.25    Treatment Plan Summary:[place] is a 20 y.o. [name] , single , lives with mother in [place] , has a prior history of schizophrenia who presented to [facility] voluntarily brought in by [facility] for aggressive behavior .Pt continues to be vaguely irritable and paranoid, making some progress.  Daily contact with patient to assess and evaluate symptoms and progress in treatment and Medication management   Will continue Invega sustenna 156 mg IM -  08/18/15 - repeat q28 days. Will continue Abilify 5 mg po qhs for augmenting the effect of the LAI. Continue Depakote ER 750 mg po qhs for mood sx.  Depakote level in 5 days- 08/22/15. Recreational therapy consult. Continue PRN medications as per agitation protocol. Continue to monitor vitals ,medication compliance and treatment side effects while patient is here.  Will monitor for medical issues as well as call consult as needed.  Reviewed labs - Pl -57.5  CSW will continue working on disposition.  Recreational therapy consult. Patient to participate in therapeutic milieu .   Selam Pietsch, MD  08/21/2015, 11:27  AM

## 2015-08-21 NOTE — [facility] Group Notes (Signed)
[facility] Group Notes:  (Counselor/Nursing/MHT/Case Management/Adjunct)  08/21/2015 1:15PM  Type of Therapy:  Group Therapy  Participation Level:  Active  Participation Quality:  Appropriate  Affect:  Flat  Cognitive:  Oriented  Insight:  Improving  Engagement in Group:  Limited  Engagement in Therapy:  Limited  Modes of Intervention:  Discussion, Exploration and Socialization  Summary of Progress/Problems: The topic for group was balance in life.  Pt participated in the discussion about when their life was in balance and out of balance and how this feels.  Pt discussed ways to get back in balance and short term goals they can work on to get where they want to be.  Invited.  Chose to not attend.  [name] Love, [name] Love 08/21/2015 12:57 PM

## 2015-08-21 NOTE — Progress Notes (Signed)
D-  Patient stated that the auditory hallucinations have been decreasing.  Patient still reports increase anxiety but denies SI, HI and VH.  Patient was noted to be in group making bizarre responses to questions asked by group leader.  Patient remains paranoid and withdrawn.    A- Assess patient for safety, offer medications as prescribed, engage patient in 1:1 staff talks.   R-  Continue to monitor as prescribed.

## 2015-08-21 NOTE — [facility] Group Notes (Signed)
[facility] Group Notes:  (Nursing/MHT/Case Management/Adjunct)  Date:  08/21/2015  Time:  10:25 AM  Type of Therapy:  Nurse Education  Participation Level:  Active  Participation Quality:  Appropriate and Attentive  Affect:  Appropriate  Cognitive:  Alert and Appropriate  Insight:  Appropriate and Good  Engagement in Group:  Engaged  Modes of Intervention:  Discussion and Education  Summary of Progress/Problems: Topic was on leisure and lifestyle changes. Discussed the importance of choosing a healthy leisure activities. Group encouraged to surround themselves with positive and healthy group/support system when changing to a healthy lifestyle. Patient was receptive and contributed.    Mickie Baillizabeth O Iwenekha 08/21/2015, 10:25 AM

## 2015-08-21 NOTE — Progress Notes (Signed)
Patient attended karaoke group tonight.  

## 2015-08-22 DIAGNOSIS — R4183 Borderline intellectual functioning: Secondary | ICD-10-CM

## 2015-08-22 LAB — VALPROIC ACID LEVEL: Valproic Acid Lvl: 47 ug/mL — ABNORMAL LOW (ref 50.0–100.0)

## 2015-08-22 MED ORDER — DIVALPROEX SODIUM ER 500 MG PO TB24: 1000.0000 mg | ORAL_TABLET | Freq: Every day | ORAL | Status: DC

## 2015-08-22 MED ORDER — TRAZODONE HCL 100 MG PO TABS: 100.0000 mg | ORAL_TABLET | Freq: Every evening | ORAL | Status: DC | PRN

## 2015-08-22 MED ORDER — DIVALPROEX SODIUM ER 500 MG PO TB24
1000.0000 mg | ORAL_TABLET | Freq: Every day | ORAL | Status: DC
  Filled 2015-08-22: qty 2
  Filled 2015-08-22: qty 14

## 2015-08-22 MED ORDER — HYDROXYZINE HCL 50 MG PO TABS: 50.0000 mg | ORAL_TABLET | Freq: Four times a day (QID) | ORAL | Status: DC | PRN

## 2015-08-22 MED ORDER — AMANTADINE HCL 100 MG PO CAPS: 100.0000 mg | ORAL_CAPSULE | Freq: Two times a day (BID) | ORAL | Status: DC

## 2015-08-22 MED ORDER — ARIPIPRAZOLE 5 MG PO TABS: 5.0000 mg | ORAL_TABLET | Freq: Every day | ORAL | Status: DC

## 2015-08-22 MED ORDER — NICOTINE 14 MG/24HR TD PT24: 14.0000 mg | MEDICATED_PATCH | Freq: Every day | TRANSDERMAL | Status: DC

## 2015-08-22 MED ORDER — DIVALPROEX SODIUM ER 250 MG PO TB24: 750.0000 mg | ORAL_TABLET | Freq: Every day | ORAL | Status: DC

## 2015-08-22 NOTE — Tx Team (Signed)
Interdisciplinary Treatment Plan Update (Adult)  Date:  08/22/2015 Time Reviewed:  10:57 AM  Progress in Treatment: Attending groups: Yes. Participating in groups:  No  Minimal interaction Taking medication as prescribed:  Yes. Tolerating medication:  Yes. Family/Significant othe contact made:  Yes, individual(s) contacted:  mother Patient understands diagnosis:  No  Limited insight Discussing patient identified problems/goals with staff:  Yes. Medical problems stabilized or resolved:  Yes. Denies suicidal/homicidal ideation: Yes. Issues/concerns per patient self-inventory:  Yes. Other:  New problem(s) identified: No, Describe:  NA  Discharge Plan or Barriers: see below   Reason for Continuation of Hospitalization:   Comments: [name] Love is a 20 y.o. [name] , single , lives with mother in [place] , has a prior history of schizophrenia who presented to [facility] voluntarily brought in by [facility] for aggressive behavior .Pt continues to be vaguely irritable and paranoid.  Will continue Invega sustenna 156 mg IM - 08/18/15 - repeat q28 days. Will cross titrate Tegretol with Depakote . Started Depakote ER 750 mg po qhs for mood sx. Tegretol level - 10 ( 08/16/15) - Depakote level in 5 days- 08/22/15. Recreational therapy consult. Recreational therapy consult.   Estimated length of stay: D/C today  New goal(s): NA  Review of initial/current patient goals per problem list:   1.  Goal(s): Patient will participate in aftercare plan * Met: No * Target date: at discharge * As evidenced by: Patient will participate within aftercare plan AEB aftercare provider and housing plan at discharge being identified. * 08/19/15:  Mother states patient cannot return home.  CSW talking to her about other options, including family or shelter.  Will also alert ACT team that he is here. * 08/22/15:  ACT team will transport to shelter  2.  Goal (s): Patient will demonstrate decreased symptoms  of psychosis. * Met: Yes *  Target date: at discharge * As evidenced by: Patient will not endorse signs of psychosis or be deemed stable for discharge by MD.  * 08/19/15:  Presents as disorganized, irritable, paranoid * 08/22/15:  No sgns nor symptoms of psychosis today  Attendees: [name] 6/9/201710:57 AM  Family:   6/9/201710:57 AM  Physician: Ursula Alert  6/9/201710:57 AM  Nurse: Manuella Ghazi  6/9/201710:57 AM  Case Manager:   6/9/201710:57 AM  Counselor:   6/9/201710:57 AM  Other:  Merri Brunette 6/9/201710:57 AM  Other:  6/9/201710:57 AM                           Scribe for Treatment Team:   Roque Lias B,MSW,   08/22/2015, 10:57 AM
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
## 2015-08-22 NOTE — Progress Notes (Signed)
  [facility] Discharge Plan :  Will you be returning to the same living situation after discharge:  No. At discharge, do you have transportation home?: Yes,  ACT team Do you have the ability to pay for your medications: Yes,  ACT team  Release of information consent forms completed and in the chart;  Patient's signature needed at discharge.  Patient to Follow up at: Follow-up Information    Follow up with [facility].   Why:  [name] Love will be here to pick you up the day of d/c   Contact information:   [address]  [place] [336] [phone]      [facility]. Schedule an appointment as soon as possible for a visit in 5 days.   Why:  Labs: recheck depakote level       Next level of care provider has [facility] Link:no  Safety Planning and Suicide Prevention discussed: Yes,  yes  Have you used any form of tobacco in the last 30 days? (Cigarettes, Smokeless Tobacco, Cigars, and/or Pipes): Yes  Has patient been referred to the Quitline?: Patient refused referral  Patient has been referred for addiction treatment: Yes  [name] Love, [name] Love 08/22/2015, 12:05 PM

## 2015-08-22 NOTE — [facility] Suicide Risk Assessment (Signed)
[facility] INPATIENT:  Family/Significant Other Suicide Prevention Education  Suicide Prevention Education:  Education Completed; No one has been identified by the patient as the family member/significant other with whom the patient will be residing, and identified as the person(s) who will aid the patient in the event of a mental health crisis (suicidal ideations/suicide attempt).  With written consent from the patient, the family member/significant other has been provided the following suicide prevention education, prior to the and/or following the discharge of the patient.  The suicide prevention education provided includes the following:  Suicide risk factors  Suicide prevention and interventions  National Suicide Hotline telephone number  [facility] assessment telephone number  [facility] Assistance 911  [facility] and/or Residential Mobile Crisis Unit telephone number  Request made of family/significant other to:  Remove weapons (e.g., guns, rifles, knives), all items previously/currently identified as safety concern.    Remove drugs/medications (over-the-counter, prescriptions, illicit drugs), all items previously/currently identified as a safety concern.  The family member/significant other verbalizes understanding of the suicide prevention education information provided.  The family member/significant other agrees to remove the items of safety concern listed above. The patient did not endorse SI at the time of admission, nor did the patient c/o SI during the stay here.  SPE not required. However, I did talk to mother, [name], who indicated patient may not return home as she and younger siblings are afraid that he will hurt them when angry.  Confirmed that she understood he would be going to shelter from here.  Daryel Geraldorth, Loxley Schmale B 08/22/2015, 12:03 PM

## 2015-08-22 NOTE — Progress Notes (Signed)
D: Pt d/c home as per MD's order. Pt was picked up by [facility] Team nurse in main lobby. A: Scheduled medications administered as per MD's orders. D/C instructions reviewed with pt including outside appointment and medications (samples and prescriptions). Writer discussed / reviewed D/C instructions (appointments, prescriptions and medication samples) with [facility] team nurse for further clarification. Contact information for [facility] was given to [facility] team nurse at time of d/c along with pt's medications and AVS.. Verbal encouragement offered towards treatment compliance post d/c. Support and availability provided to pt. All belongings from locker 31 returned to pt prior to departure from facility. Q15 minutes checks maintained for safety on and off unit till time of d/c without self injurious behavior or outburst to note at this time.  R: Pt denies SI, HI and pain when assessed. Report AH that are not commanding in nature "Just voices, but it sound like it far away"; [facility] written words in his palms. Cooperative with d/c orders. Verbalized understanding related to d/c instructions (appointments, prescriptions and medication regimen). Pt signed belonging sheet in agreement with items received from locker 31. Remains in facility till time of d/c. Appears to be in no physical distress at this time.

## 2015-08-22 NOTE — [facility] Suicide Risk Assessment (Signed)
[facility]   Principal Problem: Schizophrenia, paranoid type [facility](HCC) Discharge Diagnoses:  Patient Active Problem List   Diagnosis Date Noted  . 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 Negative for depression.  All other systems reviewed and are negative.   Blood pressure 130/70, pulse 101, temperature 98 F (36.7 C), temperature source Oral, resp. rate 18, height 5\' 10"  (1.778 m), weight 97.977 kg (216 lb).Body mass index is 30.99 kg/(m^2).  General Appearance: Fairly Groomed  Patent attorneyye Contact::  Fair  Speech:  Normal [id number]  Volume:  Normal  Mood:  Euthymic  Affect:  Congruent  Thought Process:  Goal Directed  Orientation:  Full (Time, Place, and Person)  Thought Content:  Logical  Suicidal Thoughts:  No  Homicidal Thoughts:  No  Memory:  Immediate;   Fair Recent;   Fair Remote;   Fair  Judgement:  Impaired  Insight:  Shallow  Psychomotor Activity:  Normal  Concentration:  Fair  Recall:  FiservFair  Fund of Knowledge:Fair  Language: Fair  Akathisia:  No  Handed:  Right  AIMS (if indicated):     Assets:  Desire for Improvement  Sleep:  Number of Hours: 5.75  Cognition: WNL  ADL's:  Intact   Mental Status Per Nursing Assessment::   On Admission:     Demographic Factors:  Male  Loss Factors: NA  Historical Factors: Impulsivity  Risk Reduction Factors:   Positive social support  Continued Clinical Symptoms:  Alcohol/Substance Abuse/Dependencies Previous Psychiatric Diagnoses and  Treatments  Cognitive Features That Contribute To Risk:  Polarized thinking    Suicide Risk:  Minimal: No identifiable suicidal ideation.  Patients presenting with no risk factors but with morbid ruminations; may be classified as minimal risk based on the severity of the depressive symptoms  Follow-up Information    Follow up with [facility].   Why:  Devin GoingSharon McKineey will be here to pick you up the day of d/c   Contact information:   [address]  [place] [336] [phone]      Plan Of Care/Follow-up recommendations:  Activity:  no restrictions Diet:  regular Tests:  depakote level in 5 days from 08/22/15- will need to monitor PL level. Other:  Pt received Invega sustenna IM 156 mg on 08/18/15 - however per [facility] they may not be able to continue to offer it outside. Pt was started on Abilify PO here, hence could change to Abilify Maintenna 400 mg IM if [facility] is unable to give INVEGA SUSTENNA IM on discharge.  Sherrol Vicars, MD 08/22/2015, 10:42 AM

## 2015-08-27 ENCOUNTER — Encounter (HOSPITAL_COMMUNITY): Payer: Self-pay | Admitting: Nurse Practitioner

## 2015-08-27 ENCOUNTER — Emergency Department (HOSPITAL_COMMUNITY)
Admission: EM | Admit: 2015-08-27 | Discharge: 2015-08-28 | Disposition: A | Discharge status: Home / Self Care | Payer: Medicaid Other | Attending: Emergency Medicine | Admitting: Emergency Medicine

## 2015-08-27 DIAGNOSIS — E221 Hyperprolactinemia: Secondary | ICD-10-CM | POA: Diagnosis present

## 2015-08-27 DIAGNOSIS — F203 Undifferentiated schizophrenia: Secondary | ICD-10-CM | POA: Diagnosis present

## 2015-08-27 DIAGNOSIS — R440 Auditory hallucinations: Secondary | ICD-10-CM

## 2015-08-27 DIAGNOSIS — Z91040 Latex allergy status: Secondary | ICD-10-CM | POA: Diagnosis not present

## 2015-08-27 DIAGNOSIS — Z91010 Allergy to peanuts: Secondary | ICD-10-CM | POA: Diagnosis not present

## 2015-08-27 DIAGNOSIS — Z9119 Patient's noncompliance with other medical treatment and regimen: Secondary | ICD-10-CM | POA: Insufficient documentation

## 2015-08-27 DIAGNOSIS — Z9114 Patient's other noncompliance with medication regimen: Secondary | ICD-10-CM

## 2015-08-27 DIAGNOSIS — F200 Paranoid schizophrenia: Secondary | ICD-10-CM | POA: Diagnosis present

## 2015-08-27 DIAGNOSIS — F17210 Nicotine dependence, cigarettes, uncomplicated: Secondary | ICD-10-CM | POA: Diagnosis not present

## 2015-08-27 DIAGNOSIS — R4183 Borderline intellectual functioning: Secondary | ICD-10-CM

## 2015-08-27 DIAGNOSIS — F1211 Cannabis abuse, in remission: Secondary | ICD-10-CM | POA: Diagnosis present

## 2015-08-27 LAB — COMPREHENSIVE METABOLIC PANEL
ALBUMIN: 4.3 g/dL (ref 3.5–5.0)
ALK PHOS: 78 U/L (ref 38–126)
ALT: 22 U/L (ref 17–63)
ANION GAP: 8 (ref 5–15)
AST: 25 U/L (ref 15–41)
BUN: 6 mg/dL (ref 6–20)
CALCIUM: 9.8 mg/dL (ref 8.9–10.3)
CO2: 25 mmol/L (ref 22–32)
CREATININE: 1.09 mg/dL (ref 0.61–1.24)
Chloride: 104 mmol/L (ref 101–111)
GFR calc Af Amer: >60 mL/min (ref >60)
GFR calc non Af Amer: >60 mL/min (ref >60)
GLUCOSE: 101 mg/dL — AB (ref 65–99)
Potassium: 3.9 mmol/L (ref 3.5–5.1)
SODIUM: 137 mmol/L (ref 135–145)
Total Bilirubin: 0.4 mg/dL (ref 0.3–1.2)
Total Protein: 7.4 g/dL (ref 6.5–8.1)

## 2015-08-27 LAB — RAPID URINE DRUG SCREEN, HOSP PERFORMED
AMPHETAMINES: NOT DETECTED
BARBITURATES: NOT DETECTED
Benzodiazepines: NOT DETECTED
Cocaine: NOT DETECTED
Opiates: NOT DETECTED
Tetrahydrocannabinol: POSITIVE — AB

## 2015-08-27 LAB — ETHANOL: Alcohol, Ethyl (B): <5 mg/dL (ref <5)

## 2015-08-27 LAB — CBC
HCT: 41.4 % (ref 39.0–52.0)
Hemoglobin: 13.5 g/dL (ref 13.0–17.0)
MCH: 28.5 pg (ref 26.0–34.0)
MCHC: 32.6 g/dL (ref 30.0–36.0)
MCV: 87.3 fL (ref 78.0–100.0)
PLATELETS: 221 10*3/uL (ref 150–400)
RBC: 4.74 MIL/uL (ref 4.22–5.81)
RDW: 12.9 % (ref 11.5–15.5)
WBC: 4.7 10*3/uL (ref 4.0–10.5)

## 2015-08-27 NOTE — ED Notes (Addendum)
Mom brought patient in today for hearing voices and not taking his psych medications. Mom says he has had about 4 admissions this month for behavioral issues and he is not getting any better. He is living in a group home now, mom went to see him today and he asked her to bring him here, he does not want to live there anymore. He denies any complaints and is compliant with staff in triage but is distant and anxious, he is pacing around the room and makes poor eye contact. Mom says she does not know what to do with the pt because she can not take care of him and work her job. Mom is asking us for help. He is alert and breathing easily.  He denies thoughts of hurting himself or hurting anyone else

## 2015-08-27 NOTE — ED Notes (Signed)
TTS in progress 

## 2015-08-27 NOTE — ED Provider Notes (Addendum)
CSN: [id number]     Arrival date & time 08/27/15  1832 History   First MD Initiated Contact with Patient 08/27/15 1924     Chief Complaint  Patient presents with  . Paranoid     (Consider location/radiation/quality/duration/timing/severity/associated sxs/prior Treatment) HPI Comments: 20 year old male with history of schizophrenia who presents with paranoia and hallucinations. The patient has had several psychiatric hospitalizations in the past one month. Most recently, his mother states that she no longer felt safe having him at home because of his aggression and he was therefore discharged to a shelter. Since being at a shelter, the patient states that he is "not doing well." He reports auditory hallucinations of hearing noises but denies any specific voices. He denies any visual hallucinations, SI, or HI. Mother is concerned that he is not taking his medications. Mother is concerned that the patient is unable to take care of himself but she is unable to care for him safely at her own home.  The history is provided by the patient and a parent.    Past Medical History  Diagnosis Date  . Schizophrenia, acute (HCC)    History reviewed. No pertinent past surgical history. Family History  Problem Relation Age of Onset  . Mental illness Cousin    Social History  Substance Use Topics  . Smoking status: Current Every Day Smoker -- 0.25 packs/day for 4 years    Types: Cigarettes  . Smokeless tobacco: Never Used  . Alcohol Use: No     Comment: "one time a month."    Review of Systems 10 Systems reviewed and are negative for acute change except as noted in the HPI.   Allergies  Chocolate; Latex; Pollen extract; and Peanuts  Home Medications   Prior to Admission medications   Medication Sig Start Date End Date Taking? Authorizing Provider  amantadine (SYMMETREL) 100 MG capsule Take 1 capsule (100 mg total) by mouth 2 (two) times daily. 08/22/15   Oneta Rackanika N Lewis, NP  ARIPiprazole  (ABILIFY) 5 MG tablet Take 1 tablet (5 mg total) by mouth at bedtime. 08/22/15   Oneta Rackanika N Lewis, NP  divalproex (DEPAKOTE ER) 500 MG 24 hr tablet Take 2 tablets (1,000 mg total) by mouth at bedtime. 08/22/15   Oneta Rackanika N Lewis, NP  hydrOXYzine (ATARAX/VISTARIL) 50 MG tablet Take 1 tablet (50 mg total) by mouth every 6 (six) hours as needed (anxiety / sleep). 08/22/15   Oneta Rackanika N Lewis, NP  nicotine (NICODERM CQ - DOSED IN MG/24 HOURS) 14 mg/24hr patch Place 1 patch (14 mg total) onto the skin daily. 08/22/15   Oneta Rackanika N Lewis, NP  traZODone (DESYREL) 100 MG tablet Take 1 tablet (100 mg total) by mouth at bedtime as needed and may repeat dose one time if needed for sleep. 08/22/15   Oneta Rackanika N Lewis, NP   BP 136/90 mmHg  Pulse 96  Temp(Src) 98.3 F (36.8 C) (Oral)  Resp 18  SpO2 99% Physical Exam  Constitutional: He is oriented to person, place, and time. He appears well-developed and well-nourished. No distress.  HENT:  Head: Normocephalic and atraumatic.  Moist mucous membranes  Eyes: Conjunctivae are normal. Pupils are equal, round, and reactive to light.  Neck: Neck supple.  Cardiovascular: Normal rate, regular rhythm and normal heart sounds.   No murmur heard. Pulmonary/Chest: Effort normal and breath sounds normal.  Abdominal: Soft. Bowel sounds are normal. He exhibits no distension. There is no tenderness.  Musculoskeletal: He exhibits no edema.  Neurological: He is alert  and oriented to person, place, and time.  Fluent speech  Skin: Skin is warm and dry.  Psychiatric:  Flat affect, pacing room, avoids eye contact, paucity of speech  Nursing note and vitals reviewed.   ED Course  Procedures (including critical care time) Labs Review Labs Reviewed  COMPREHENSIVE METABOLIC PANEL - Abnormal; Notable for the following:    Glucose, Bld 101 (*)    All other components within normal limits  URINE RAPID DRUG SCREEN, HOSP PERFORMED - Abnormal; Notable for the following:    Tetrahydrocannabinol  POSITIVE (*)    All other components within normal limits  ETHANOL  CBC    Imaging Review No results found. I have personally reviewed and evaluated these lab results as part of my medical decision-making.   EKG Interpretation None      MDM   Final diagnoses:  None   Patient with history of schizophrenia presents with ongoing problems with auditory hallucinations, paranoia, and medication noncompliance. Mother brought him in due to concerns for his health but states she is unable to take care of him at her home. On exam, he was pacing the room, avoiding eye contact, with a flat affect. Vital signs unremarkable. Physical exam reassuring. Labs notable only for UDS positive for THC. I have consulted TTS for evaluation. The pt's disposition is pending psychiatry recommendations.     Laurence Spates, MD 08/27/15 2045  1:17 AM TTS has Evaluated the patient and they have recommended inpatient treatment. The patient will be placed with the assistance of social work in the morning.  Laurence Spates, MD 08/28/15 [phone]
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
## 2015-08-27 NOTE — ED Notes (Signed)
Pt mother states she has to leave and is requesting we give her a call once we know something regarding disposition/ plan. Pt mother- [name] [phone]

## 2015-08-27 NOTE — ED Notes (Signed)
Pt denies SI/HI/AVH, pt states that he has been taking his medication since leaving, Mother reports differently, stating that when she tried to get him to take medications today, he threw a pill out the window. Pt was living in homeless shelter and mom reports he lost his bed there and has no where to go, mother reports pt is unable to come home with her. Pt withdrawn, flat affect, staring into space, slow to answer questions.

## 2015-08-28 NOTE — Discharge Instructions (Signed)
This may be due to marijuana smoking. Try to quit. Paranoia Paranoia is a general mistrust of others. People with paranoia may feel as though people are "out to get them" and the world is against them without reason. They may be afraid of others or behave in an angry or hostile way toward others. HOME CARE INSTRUCTIONS Monitor your thoughts and mood for any changes. Take these steps to help with your condition:  Take over-the-counter and prescription medicines only as told by your health care provider.  Check with your health care provider before taking any herbs or supplements.  Keep all follow-up visits as told by your health care provider.This is important.  Maintain a healthy lifestyle:  Eat a healthy diet.  Exercise regularly.  Get plenty of sleep.  Avoid alcohol and drugs.  Learn ways to reduce stress and cope with stress, such as with yoga and meditation.  Talk about your feelings with family members or health care providers.  Make time for yourself to do things that you enjoy. SEEK MEDICAL CARE IF:  Medicines do not seem to be helping.  You feel extremely fearful and suspicious that something will harm you.  You feel hopeless and overwhelmed.  You feel like you cannot leave your house.  You have trouble taking care of yourself. SEEK IMMEDIATE MEDICAL CARE IF:  You have serious thoughts about hurting yourself or others.   This information is not intended to replace advice given to you by your health care provider. Make sure you discuss any questions you have with your health care provider.   Document Released: 03/04/2003 Document Revised: 07/16/2014 Document Reviewed: 01/06/2014 Elsevier Interactive Patient Education [employer]

## 2015-08-28 NOTE — ED Notes (Signed)
Pt. Ambulated to the phone. Pt. Called family member and claims he is seeing things and does not know if it is 'pain'.
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
## 2015-08-28 NOTE — ED Notes (Signed)
Pt. Given snack and drink. Dinner tray ordered.

## 2015-08-28 NOTE — Progress Notes (Signed)
Discussed pt's case with psych team. Plan for pt to be re-evaluated today to determine cont'd need for inpatient treatment. Contacted pt's ACTT [facility] [[phone]] [phone]. Spoke with ACTT assistant Delsa BernLakyria who states she will inform team to Higher education careers advisercontact writer.  Ilean SkillMeghan Rayden Dock, MSW, LCSW Clinical Social Work, Disposition  08/28/2015 [phone]

## 2015-08-28 NOTE — ED Notes (Signed)
Regular diet breakfast tray ordered for patient.

## 2015-08-28 NOTE — ED Notes (Signed)
Pt. Sitting in recliner at this time.

## 2015-08-28 NOTE — ED Notes (Signed)
Patient was given a snack and drink. A regular diet ordered for lunch. 

## 2015-08-28 NOTE — Progress Notes (Signed)
Discussed pt's case with Ander Slade. Starkes, psych NP who evaluated pt this morning, and Dr. Lucianne MussKumar. Recommend pt be d/c to care of [facility] ([facility]) as he does not currently meet inpatient criteria. Spoke with pt's mother [name], pt's mother. She was informed pt has been deemed stable for d/c. She notes that she is not willing to have pt return to living with her at this time, states she has deferred to ACTT and pt as far as his future housing needs. States she has been helping pt and ACTT with his MCD applications in order to try getting access to more resources.  Contacted [facility] ACTT [phone]. Spoke with Jonny RuizJohn who is aware of psychiatry's recommendation. States until further resources can be accessed, shelter housing is only option for pt at this time. States team is engaged in a meeting and ACTT will call back in order to move forward with plan to d/c.  Ilean SkillMeghan [place] Breeding, MSW, LCSW Clinical Social Work, Disposition  08/28/2015 [phone]

## 2015-08-28 NOTE — ED Notes (Signed)
TTS car t2  at bedside.  

## 2015-10-06 ENCOUNTER — Emergency Department (HOSPITAL_COMMUNITY)
Admission: EM | Admit: 2015-10-06 | Discharge: 2015-10-08 | Disposition: A | Discharge status: Other Acute Inpatient Hospital | Payer: Medicaid Other | Attending: Emergency Medicine | Admitting: Emergency Medicine

## 2015-10-06 DIAGNOSIS — Z79899 Other long term (current) drug therapy: Secondary | ICD-10-CM | POA: Insufficient documentation

## 2015-10-06 DIAGNOSIS — Z9114 Patient's other noncompliance with medication regimen: Secondary | ICD-10-CM

## 2015-10-06 DIAGNOSIS — Z87891 Personal history of nicotine dependence: Secondary | ICD-10-CM | POA: Insufficient documentation

## 2015-10-06 DIAGNOSIS — F200 Paranoid schizophrenia: Secondary | ICD-10-CM | POA: Insufficient documentation

## 2015-10-06 DIAGNOSIS — Z046 Encounter for general psychiatric examination, requested by authority: Secondary | ICD-10-CM

## 2015-10-06 DIAGNOSIS — F1099 Alcohol use, unspecified with unspecified alcohol-induced disorder: Secondary | ICD-10-CM | POA: Insufficient documentation

## 2015-10-06 DIAGNOSIS — R258 Other abnormal involuntary movements: Secondary | ICD-10-CM | POA: Insufficient documentation

## 2015-10-06 DIAGNOSIS — F17210 Nicotine dependence, cigarettes, uncomplicated: Secondary | ICD-10-CM | POA: Insufficient documentation

## 2015-10-06 LAB — CBC
HEMATOCRIT: 40.1 % (ref 39.0–52.0)
HEMOGLOBIN: 13.2 g/dL (ref 13.0–17.0)
MCH: 29.2 pg (ref 26.0–34.0)
MCHC: 32.9 g/dL (ref 30.0–36.0)
MCV: 88.7 fL (ref 78.0–100.0)
Platelets: 224 10*3/uL (ref 150–400)
RBC: 4.52 MIL/uL (ref 4.22–5.81)
RDW: 12.3 % (ref 11.5–15.5)
WBC: 5.1 10*3/uL (ref 4.0–10.5)
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
## 2015-10-06 NOTE — ED Notes (Signed)
Pt has in belonging:  [employer], grey shirt, white cargo shorts, black socks.

## 2015-10-07 ENCOUNTER — Encounter (HOSPITAL_COMMUNITY): Payer: Self-pay | Admitting: *Deleted

## 2015-10-07 LAB — COMPREHENSIVE METABOLIC PANEL
ALBUMIN: 4.5 g/dL (ref 3.5–5.0)
ALK PHOS: 72 U/L (ref 38–126)
ALT: 18 U/L (ref 17–63)
ANION GAP: 6 (ref 5–15)
AST: 23 U/L (ref 15–41)
BUN: 10 mg/dL (ref 6–20)
CALCIUM: 9.2 mg/dL (ref 8.9–10.3)
CHLORIDE: 106 mmol/L (ref 101–111)
CO2: 28 mmol/L (ref 22–32)
Creatinine, Ser: 1.15 mg/dL (ref 0.61–1.24)
GFR calc Af Amer: >60 mL/min (ref >60)
GFR calc non Af Amer: >60 mL/min (ref >60)
GLUCOSE: 98 mg/dL (ref 65–99)
Potassium: 4 mmol/L (ref 3.5–5.1)
SODIUM: 140 mmol/L (ref 135–145)
Total Bilirubin: 0.6 mg/dL (ref 0.3–1.2)
Total Protein: 7.6 g/dL (ref 6.5–8.1)

## 2015-10-07 LAB — ACETAMINOPHEN LEVEL

## 2015-10-07 LAB — SALICYLATE LEVEL

## 2015-10-07 LAB — ETHANOL: Alcohol, Ethyl (B): <5 mg/dL (ref <5)

## 2015-10-07 MED ORDER — HYDROXYZINE HCL 25 MG PO TABS: 50.0000 mg | ORAL_TABLET | Freq: Four times a day (QID) | ORAL | Status: DC | PRN

## 2015-10-07 MED ORDER — DIVALPROEX SODIUM ER 500 MG PO TB24
1000.0000 mg | ORAL_TABLET | Freq: Every day | ORAL | Status: DC
  Administered 2015-10-07: 1000 mg via ORAL
  Filled 2015-10-07: qty 2

## 2015-10-07 MED ORDER — ARIPIPRAZOLE 5 MG PO TABS
5.0000 mg | ORAL_TABLET | Freq: Every day | ORAL | Status: DC
  Administered 2015-10-07: 5 mg via ORAL
  Filled 2015-10-07: qty 1

## 2015-10-07 MED ORDER — HYDROXYZINE HCL 25 MG PO TABS
50.0000 mg | ORAL_TABLET | Freq: Three times a day (TID) | ORAL | Status: DC
  Administered 2015-10-07 – 2015-10-08 (×4): 50 mg via ORAL
  Filled 2015-10-07 (×4): qty 2

## 2015-10-07 MED ORDER — DIPHENHYDRAMINE HCL 25 MG PO CAPS
25.0000 mg | ORAL_CAPSULE | Freq: Once | ORAL | Status: AC
  Administered 2015-10-07: 25 mg via ORAL
  Filled 2015-10-07: qty 1

## 2015-10-07 MED ORDER — TRAZODONE HCL 100 MG PO TABS: 100.0000 mg | ORAL_TABLET | Freq: Every evening | ORAL | Status: DC | PRN

## 2015-10-07 NOTE — ED Notes (Signed)
Patient noted in room. No complaints, stable, in no acute distress. Q15 minute rounds and monitoring via security cameras continue for safety. 
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
## 2015-10-07 NOTE — ED Notes (Signed)
Pt forwards little with this nurse, paranoid. Pt denies SI/HI. Endorsing AVH. Special checks q 15 mins in place for safety. Video monitoring in place.

## 2015-10-07 NOTE — ED Notes (Signed)
This nurse in patient room to administer prescribed medication regimen. This nurse noted two peanut butter packets in pt room, unknown how pt obtained them.  Pt has recorded ax to peanuts. When asked, pt reports he is not allergic to peanuts. No s/s of distress noted, no rash noted on anterior or posterior trunk. May NP notified. Will continue to monitor.

## 2015-10-07 NOTE — Progress Notes (Signed)
Patient listed as not having  a pcp or insurance living in [place].  EDCM provide patient with contact information to [facility], informed patient of services there and walk in times.  EDCM also provided patient with list of pcps who accept self pay patients, list of discount pharmacies and websites needymeds.org and GoodRX.com for medication assistance, phone number to inquire about the orange card, phone number to inquire about Mediciad, phone number to inquire about the Affordable Care Act, financial resources in the community such as local churches, salvation army, urban ministries, and dental assistance for uninsured patients.  Contact information for the [facility] and [facility] provided. No further EDCM needs at this time.  The patient was asleep at this time.  The above information was placed in patient's binder at the nurse's station. 

## 2015-10-07 NOTE — BH Assessment (Signed)
[facility] Assessment Progress Note  Per [name], MD, this pt requires psychiatric hospitalization at this time.  The following facilities have been contacted to seek placement for this pt, with results as noted:  Beds available, information sent, decision pending:  [place] Esmond Camper   At capacity:  [facility] Doran Heater, [place] Triage Specialist [phone]

## 2015-10-07 NOTE — ED Notes (Signed)
Patient denies SI, and HI at this time. Patient does reports AVH at this time. Patient appears irritable a this time. Encouragement and support provided and safety maintain. Q 15 min safety checks remain in place. Q 15 min safety checks remain in place.

## 2015-10-08 ENCOUNTER — Encounter (HOSPITAL_COMMUNITY): Payer: Self-pay

## 2015-10-08 ENCOUNTER — Inpatient Hospital Stay (HOSPITAL_COMMUNITY)
Admission: AD | Admit: 2015-10-08 | Discharge: 2015-10-17 | DRG: 885 | Disposition: A | Discharge status: Home / Self Care | Payer: Medicaid Other | Attending: Psychiatry | Admitting: Psychiatry

## 2015-10-08 DIAGNOSIS — Z111 Encounter for screening for respiratory tuberculosis: Secondary | ICD-10-CM

## 2015-10-08 DIAGNOSIS — F200 Paranoid schizophrenia: Secondary | ICD-10-CM | POA: Diagnosis present

## 2015-10-08 DIAGNOSIS — Z87891 Personal history of nicotine dependence: Secondary | ICD-10-CM | POA: Diagnosis not present

## 2015-10-08 DIAGNOSIS — F250 Schizoaffective disorder, bipolar type: Secondary | ICD-10-CM | POA: Diagnosis present

## 2015-10-08 DIAGNOSIS — R4183 Borderline intellectual functioning: Secondary | ICD-10-CM

## 2015-10-08 DIAGNOSIS — F1220 Cannabis dependence, uncomplicated: Secondary | ICD-10-CM | POA: Diagnosis present

## 2015-10-08 DIAGNOSIS — Z9119 Patient's noncompliance with other medical treatment and regimen: Secondary | ICD-10-CM

## 2015-10-08 MED ORDER — IBUPROFEN 600 MG PO TABS: 600.0000 mg | ORAL_TABLET | Freq: Three times a day (TID) | ORAL | Status: DC | PRN

## 2015-10-08 MED ORDER — ALUM & MAG HYDROXIDE-SIMETH 200-200-20 MG/5ML PO SUSP: 30.0000 mL | ORAL | Status: DC | PRN

## 2015-10-08 MED ORDER — ONDANSETRON HCL 4 MG PO TABS: 4.0000 mg | ORAL_TABLET | Freq: Three times a day (TID) | ORAL | Status: DC | PRN

## 2015-10-08 MED ORDER — ARIPIPRAZOLE 10 MG PO TABS: 10.0000 mg | ORAL_TABLET | Freq: Every day | ORAL | Status: DC

## 2015-10-08 MED ORDER — ACETAMINOPHEN 325 MG PO TABS
650.0000 mg | ORAL_TABLET | ORAL | Status: DC | PRN
  Administered 2015-10-12 – 2015-10-16 (×4): 650 mg via ORAL
  Filled 2015-10-08 (×5): qty 2

## 2015-10-08 MED ORDER — HYDROXYZINE HCL 25 MG PO TABS
50.0000 mg | ORAL_TABLET | Freq: Two times a day (BID) | ORAL | Status: DC
  Administered 2015-10-08: 50 mg via ORAL
  Filled 2015-10-08: qty 2

## 2015-10-08 NOTE — ED Notes (Signed)
[facility] contacted for transport to [facility]. 
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
## 2015-10-08 NOTE — ED Notes (Signed)
Pt guarded, forwards little with this nurse. Denies SI/HI. Blank stare, delusional. Special checks q 15 mins in place for safety. Video monitoring in place. Will continue to monitor.

## 2015-10-08 NOTE — ED Notes (Signed)
Patient denies SI, HI and AVH at this time. Encouragement and support provided and safety maintain. Q 15 min safety checks in place and video monitoring.

## 2015-10-08 NOTE — ED Notes (Signed)
Pt transported to [facility] by [facility] for continuation of specialized care. Pt left in no acute distress. Belongings signed for and given to [employer] officer. Pt left in no acute distress. 

## 2015-10-08 NOTE — [facility] Counselor (Signed)
Per [name], MD, this pt requires psychiatric hospitalization at this time.  The following facilities have been contacted to seek placement for this pt, with results as noted:  Beds available, information sent, decision pending: [place]  [address] [place], [place] OBS Counselor

## 2015-10-08 NOTE — BH Assessment (Signed)
[facility] Assessment Progress Note  Per [name], MD, this pt requires psychiatric hospitalization at this time.  [name], RN, [facility] has assigned pt to [facility] Rm 502-2; they will be ready to receive pt at 17:00.  Pt presents under IVC, and IVC documents have been faxed to [facility].  Pt's nurse, Morrie Sheldon, has been notified, and agrees to call report to [phone].  Pt is to be transported via [employer].  Doylene Canning, MA Triage Specialist [phone]

## 2015-10-08 NOTE — Tx Team (Signed)
Initial Interdisciplinary Treatment Plan   PATIENT STRESSORS: Marital or family conflict Medication change or noncompliance   PATIENT STRENGTHS: Physical Health Special hobby/interest   PROBLEM LIST: Problem List/Patient Goals Date to be addressed Date deferred Reason deferred Estimated date of resolution  Psychosis 10/08/15     Aggressive with family 10/08/15     Non compliance with medication 10/08/15     "Get back on medicine" 10/08/15     "I don't hay any other goal at this time" 10/08/15                              DISCHARGE CRITERIA:  Improved stabilization in mood, thinking, and/or behavior Verbal commitment to aftercare and medication compliance  PRELIMINARY DISCHARGE PLAN: Outpatient therapy Medication management  PATIENT/FAMIILY INVOLVEMENT: This treatment plan has been presented to and reviewed with the patient, [name] Love.  The patient and family have been given the opportunity to ask questions and make suggestions.  Norm Parcel Munirah Doerner 10/08/2015, 10:28 PM

## 2015-10-09 ENCOUNTER — Encounter (HOSPITAL_COMMUNITY): Payer: Self-pay | Admitting: Psychiatry

## 2015-10-09 DIAGNOSIS — F250 Schizoaffective disorder, bipolar type: Secondary | ICD-10-CM | POA: Diagnosis present

## 2015-10-09 LAB — RAPID URINE DRUG SCREEN, HOSP PERFORMED
AMPHETAMINES: NOT DETECTED
BENZODIAZEPINES: NOT DETECTED
Barbiturates: NOT DETECTED
COCAINE: NOT DETECTED
OPIATES: NOT DETECTED
TETRAHYDROCANNABINOL: NOT DETECTED

## 2015-10-09 MED ORDER — ARIPIPRAZOLE 5 MG PO TABS
5.0000 mg | ORAL_TABLET | Freq: Every day | ORAL | Status: DC
  Administered 2015-10-09: 5 mg via ORAL
  Filled 2015-10-09 (×2): qty 1

## 2015-10-09 MED ORDER — BENZTROPINE MESYLATE 0.5 MG PO TABS
0.5000 mg | ORAL_TABLET | Freq: Every day | ORAL | Status: DC
  Administered 2015-10-09 – 2015-10-16 (×8): 0.5 mg via ORAL
  Filled 2015-10-09: qty 1
  Filled 2015-10-09: qty 7
  Filled 2015-10-09 (×8): qty 1

## 2015-10-09 MED ORDER — TRAZODONE HCL 100 MG PO TABS
100.0000 mg | ORAL_TABLET | Freq: Every evening | ORAL | Status: DC | PRN
  Administered 2015-10-11 – 2015-10-16 (×4): 100 mg via ORAL
  Filled 2015-10-09 (×5): qty 1

## 2015-10-09 MED ORDER — MAGNESIUM HYDROXIDE 400 MG/5ML PO SUSP: 30.0000 mL | Freq: Every day | ORAL | Status: DC | PRN

## 2015-10-09 MED ORDER — DIVALPROEX SODIUM ER 500 MG PO TB24
1000.0000 mg | ORAL_TABLET | Freq: Every day | ORAL | Status: DC
  Administered 2015-10-09 – 2015-10-16 (×8): 1000 mg via ORAL
  Filled 2015-10-09 (×3): qty 2
  Filled 2015-10-09: qty 14
  Filled 2015-10-09 (×6): qty 2
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
## 2015-10-09 NOTE — Progress Notes (Signed)
Patient ID: [name] Love, male   DOB: March 15, 1996, 20 y.o.   MRN: [id number] D: Client reports of his day "good" denies harmful thoughts, denies AH, reports VH, but does not elaborate. Client appears preoccupied, stares at Clinical research associate, able to verbalize needs. Client out of room for snacks and some TV, remains in room most of the evening. A: Clinical research associate provided emotional support, reviewed medication, administered as ordered. Medications reviewed, administered as ordered. R: Client is safe on the unit, did not attend karaoke.
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
## 2015-10-09 NOTE — Progress Notes (Signed)
Patient didn't attend group this evening.  

## 2015-10-09 NOTE — [facility] Suicide Risk Assessment (Signed)
[facility] Admission Suicide Risk Assessment   Nursing information obtained from:  Patient Demographic factors:  Male, Low socioeconomic status Current Mental Status:  NA Loss Factors:  NA Historical Factors:  Impulsivity Risk Reduction Factors:  NA  Total Time spent with patient: 30 minutes Principal Problem: Schizoaffective disorder, bipolar type (HCC) Diagnosis:   Patient Active Problem List   Diagnosis Date Noted  . 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 Cannabis use disorder, severe, dependence (HCC) [F12.20] 11/26/2014   Subjective Data: Please see H&P.   Continued Clinical Symptoms:  Alcohol Use Disorder Identification Test Final Score (AUDIT): 0 The "Alcohol Use Disorders Identification Test", Guidelines for Use in Primary Care, Second Edition.  World Science writer [facility]). Score between 0-7:  no or low risk or alcohol related problems. Score between 8-15:  moderate risk of alcohol related problems. Score between 16-19:  high risk of alcohol related problems. Score 20 or above:  warrants further diagnostic evaluation for alcohol dependence and treatment.   CLINICAL FACTORS:   Alcohol/Substance Abuse/Dependencies Previous Psychiatric Diagnoses and Treatments   Musculoskeletal: Strength & Muscle Tone: within normal limits Gait & Station: normal Patient leans: N/A  Psychiatric Specialty Exam: Physical Exam  ROS  Blood pressure 124/71, pulse 96, temperature 98.2 F (36.8 C), temperature source Oral, resp. rate (!) 24, height 5\' 10"  (1.778 m), weight 94.8 kg (209 lb), SpO2 98 %.Body mass index is 29.99 kg/m.            Please see H&P.                                               COGNITIVE FEATURES THAT CONTRIBUTE TO RISK:  Closed-mindedness, Polarized thinking and Thought constriction (tunnel vision)    SUICIDE RISK:   Moderate:  Frequent suicidal ideation with limited intensity, and duration, some specificity in terms of plans, no associated intent, good self-control, limited dysphoria/symptomatology, some risk factors present, and identifiable protective factors, including available and accessible social support.   PLAN OF CARE: Please see H&P.   I certify that inpatient services furnished can reasonably be expected to improve the patient's condition.  Redford Behrle, MD 10/09/2015, 11:43 AM
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
## 2015-10-09 NOTE — [facility] Suicide Risk Assessment (Signed)
[facility] INPATIENT:  Family/Significant Other Suicide Prevention Education  Suicide Prevention Education:  Contact Attempts: [name] Love [phone], (name of family member/significant other) has been identified by the patient as the family member/significant other with whom the patient will be residing, and identified as the person(s) who will aid the patient in the event of a mental health crisis.  With written consent from the patient, two attempts were made to provide suicide prevention education, prior to and/or following the patient's discharge.  We were unsuccessful in providing suicide prevention education.  A suicide education pamphlet was given to the patient to share with family/significant other  Date and time of first attempt:  10/09/15 at 5:20 PM  Date and time of second attempt:10/16/2015 11:53 AM   [name] 10/09/2015, 5:22 PM

## 2015-10-09 NOTE — Progress Notes (Signed)
D:Pt appears to be thought blocking and observed staring into space while sitting in the dayroom. Pt has an intense stare when talking with Clinical research associate. He is guarded and reluctant to given a urine specimen when requested.  A:Offered support, encouragement and 15 minute checks. R:Pt denies si and hi. Pt verbally denies hallucinations. Safety maintained on the unit.

## 2015-10-09 NOTE — Progress Notes (Signed)
Returned phone call to Chyrl Civatte at [facility] [phone] with list of pt's medications as requested by CM.

## 2015-10-09 NOTE — [facility] Group Notes (Signed)
[facility] LCSW Group Therapy  10/09/2015 6:12 PM  Type of Therapy:  Group Therapy  Participation Level:  Minimal  Participation Quality:  Limited  Affect:  Appropriate  Cognitive:  Limited  Insight:  Developing/Improving  Engagement in Therapy:  Developing/Improving  Modes of Intervention:  Discussion, Exploration, Socialization and Support  Summary of Progress/Problems:  Finding Balance in Life. Today's group focused on defining balance in one's own words, identifying things that can knock one off balance, and exploring healthy ways to maintain balance in life. Group members were asked to provide an example of a time when they felt off balance, describe how they handled that situation, and process healthier ways to regain balance in the future. Group members were asked to share the most important tool for maintaining balance that they learned while at [facility] and how they plan to apply this method after discharge.Left midway through group and did not return.  Had difficulty following group discussion and communicating his ideas to group.  Identified drumming as important to him as well as music.   [name] 10/09/2015, 6:12 PM

## 2015-10-10 MED ORDER — ARIPIPRAZOLE 10 MG PO TABS
10.0000 mg | ORAL_TABLET | Freq: Every day | ORAL | Status: DC
  Administered 2015-10-10 – 2015-10-12 (×3): 10 mg via ORAL
  Filled 2015-10-10 (×4): qty 1

## 2015-10-10 NOTE — Progress Notes (Signed)
Patient ID: [name] Love, male   DOB: July 08, 1995, 20 y.o.   MRN: [id number]   Pt currently presents with a flat affect and paranoid, on guard behavior. Pt is cooperative and follows commands, glares at others when they walk by. Pt states "I want it to be warm." Pt reports * sleep with current medication regimen.   Pt provided with medications per providers orders. Pt's labs and vitals were monitored throughout the night. Pt supported emotionally and encouraged to express concerns and questions. Pt educated on medications.  Pt's safety ensured with 15 minute and environmental checks. Pt currently denies SI/HI and A/V hallucinations. Pt verbally agrees to seek staff if SI/HI or A/VH occurs and to consult with staff before acting on any harmful thoughts. Will continue POC.

## 2015-10-10 NOTE — Progress Notes (Signed)
Adult Psychoeducational Group Note  Date:  10/10/2015 Time:  8:46 PM  Group Topic/Focus:  Wrap-Up Group:   The focus of this group is to help patients review their daily goal of treatment and discuss progress on daily workbooks.   Participation Level:  Active  Participation Quality:  Appropriate  Affect:  Appropriate  Cognitive:  Appropriate  Insight: Appropriate  Engagement in Group:  Engaged  Modes of Intervention:  Discussion  Additional Comments: The patient expressed that he went to groups. [name] 10/10/2015, 8:46 PM

## 2015-10-10 NOTE — [facility] Group Notes (Signed)
[facility] LCSW Group Therapy  10/10/2015 4:05 PM   Type of Therapy:  Group Therapy  Participation Level:  Limited  Participation Quality:  Minimal  Affect:  Blunted  Cognitive:  Appropriate  Insight:  Minimal  Engagement in Therapy:  minimal  Modes of Intervention:  Discussion, Exploration, Socialization and Support  Summary of Progress/Problems:  Chaplain led group explored concept of hope and its relevance to mental health recovery.  Patients explored themes including what matters to them personally, how others responses are similar/different, and what they are hopeful for.  Group members discussed relevance of social supports, innter strength and using their own stories to craft a recovery path.  Initially was quiet and would not respond to questions.  With prompting, spoke about aunt and significant family connections which give him courage.    [name]

## 2015-10-10 NOTE — Plan of Care (Signed)
Problem: Safety: Goal: Ability to remain free from injury will improve Outcome: Progressing Client remains free from injury AEB q76min safety checks, denies harmful thoughts, interacts appropriately with staff and peers.

## 2015-10-10 NOTE — Progress Notes (Signed)
Data. Patient slept late this shift.Patient denies SI/HI/AVH.  However he is exhibiting paranoid behavior, by his isolation in his room and his not interacting on the unit, Some thought blocking noted during conversation, with patient taking a significant time to either begin speaking, or responding to questions and looking confused/troubled in the interim. He is also observed looking into space and appears to be responding to internal stimulation. Wearing a comb stuck in his hair.  Action. Emotional support and encouragement offered. Q 15 minute checks done for safety. Response. Safety on the unit maintained through 15 minute checks. Remained calm throughout shift.

## 2015-10-10 NOTE — Progress Notes (Signed)
[facility] MD Progress Note  10/10/2015 10:38 AM [name] Love  MRN:  [id number] Subjective:  Patient states " I am fine."        Objective:[name] Love is a 20 y.o. [name] , single , lives with mother in [place] , has a prior history of schizophrenia who [facility]  Patient seen and chart reviewed.Discussed patient with treatment team.  Pt today seen in bed , withdrawn ,continues to be irritable , has a restricted affect. He appears to be guarded and paranoid . Per staff - pt with concrete thought process , very limited interaction on the unit . He has had no disruptive issues noted on the unit . Continues to require encouragement and support.      Principal Problem: Schizophrenia, paranoid type (HCC) Diagnosis:   Patient Active Problem List   Diagnosis Date Noted  . 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 Cannabis use disorder, severe, dependence (HCC) [F12.20] 11/26/2014   Total Time spent with patient: 25 minutes  Past Psychiatric History: See Above  Past Medical History:  Past Medical History:  Diagnosis Date  . Schizophrenia, acute (HCC)    History reviewed. No pertinent surgical history. Family History: Please see H&P.  Family History  Problem Relation Age of Onset  . Mental illness Cousin    Family Psychiatric  History: See Above Social History:  History  Alcohol Use No    Comment: "one time a month."     History  Drug Use  . Types: Marijuana    Social History   Social History  . Marital status: Single    Spouse name: N/A  . Number of children: N/A  . Years of education: N/A   Social History Main Topics  . Smoking status: Former Smoker    Packs/day: 0.00    Years: 4.00  . Smokeless tobacco: Never Used  . Alcohol use No     Comment: "one time a month."  . Drug use:     Types:  Marijuana  . Sexual activity: Not Currently   Other Topics Concern  . None   Social History Narrative  . None   Additional Social History:    Pain Medications: see PTA list Prescriptions: see PTA list Over the Counter: see PTA list History of alcohol / drug use?: No history of alcohol / drug abuse Longest period of sobriety (when/how long): n/a          Sleep: Fair  Appetite:  Fair  Current Medications: Current Facility-Administered Medications  Medication Dose Route Frequency Provider Last Rate Last Dose  . acetaminophen (TYLENOL) tablet 650 mg  650 mg Oral Q4H PRN Charm Rings, NP      . alum & mag hydroxide-simeth (MAALOX/MYLANTA) 200-200-20 MG/5ML suspension 30 mL  30 mL Oral PRN Charm Rings, NP      . ARIPiprazole (ABILIFY) tablet 10 mg  10 mg Oral QHS Seiji Wiswell, MD      . benztropine (COGENTIN) tablet 0.5 mg  0.5 mg Oral QHS Jomarie Longs, MD   0.5 mg at 10/09/15 2136  . divalproex (DEPAKOTE ER) 24 hr tablet 1,000 mg  1,000 mg Oral QHS Jomarie Longs, MD   1,000 mg at 10/09/15 2136  . ibuprofen (ADVIL,MOTRIN) tablet 600 mg  600 mg Oral Q8H PRN Charm Rings, NP      . magnesium hydroxide (MILK OF MAGNESIA)  suspension 30 mL  30 mL Oral Daily PRN Jomarie Longs, MD      . ondansetron (ZOFRAN) tablet 4 mg  4 mg Oral Q8H PRN Charm Rings, NP      . traZODone (DESYREL) tablet 100 mg  100 mg Oral QHS PRN Jomarie Longs, MD        Lab Results:  Results for orders placed or performed during the hospital encounter of 10/08/15 (from the past 48 hour(s))  Rapid urine drug screen (hospital performed)     Status: None   Collection Time: 10/09/15 12:00 PM  Result Value Ref Range   Opiates NONE DETECTED NONE DETECTED   Cocaine NONE DETECTED NONE DETECTED   Benzodiazepines NONE DETECTED NONE DETECTED   Amphetamines NONE DETECTED NONE DETECTED   Tetrahydrocannabinol NONE DETECTED NONE DETECTED   Barbiturates NONE DETECTED NONE DETECTED    Comment:        DRUG  SCREEN FOR MEDICAL PURPOSES ONLY.  IF CONFIRMATION IS NEEDED FOR ANY PURPOSE, NOTIFY LAB WITHIN 5 DAYS.        LOWEST DETECTABLE LIMITS FOR URINE DRUG SCREEN Drug Class       Cutoff (ng/mL) Amphetamine      1000 Barbiturate      200 Benzodiazepine   200 Tricyclics       300 Opiates          300 Cocaine          300 THC              50 Performed at [facility]     Blood Alcohol level:  Lab Results  Component Value Date   [facility] <5 10/06/2015   ETH <5 08/27/2015    Physical Findings: AIMS:  , ,  ,  ,    CIWA:    COWS:     Musculoskeletal: Strength & Muscle Tone: within normal limits Gait & Station: normal Patient leans: N/A  Psychiatric Specialty Exam: Physical Exam  Nursing note and vitals reviewed. Constitutional: He is oriented to person, place, and time. He appears well-developed.  Cardiovascular: Normal rate.   Neurological: He is oriented to person, place, and time.  Psychiatric: He has a normal mood and affect. His behavior is normal.    Review of Systems  Psychiatric/Behavioral: Positive for substance abuse. The patient is nervous/anxious.   All other systems reviewed and are negative.   Blood pressure 119/62, pulse (!) 116, temperature 98.6 F (37 C), temperature source Oral, resp. rate 16, height 5\' 10"  (1.778 m), weight 94.8 kg (209 lb), SpO2 98 %.Body mass index is 29.99 kg/m.  General Appearance: Guarded  Eye Contact:  Minimal  Speech:  normal rate but limited  Volume:  Normal  Mood:  Anxious and Irritable   Affect:  Congruent  Thought Process:  Goal Directed and Descriptions of Associations: Circumstantial  Orientation:  Other:  Person and place  Thought Content:  Delusions, Paranoid Ideation and Rumination improving  Suicidal Thoughts:  No  Homicidal Thoughts:  No is paranoid - can be a danger to self or others - has extensive hx of aggression- improving   Memory:  Immediate;   Fair Recent;   Fair Remote;   Fair  Judgement:   Poor  Insight:  Lacking  Psychomotor Activity:  Normal  Concentration:  Concentration: Fair  Recall:  [employer] of Knowledge:  Fair  Language:  Good  Akathisia:  No  Handed:  Right  AIMS (if indicated):  Assets:  Desire for Improvement Resilience  ADL's:  Intact  Cognition:  WNL  Sleep:  Number of Hours: 6.75    Treatment Plan Summary:[name] Love is a 20 y.o. [name] , single , lives with mother in [place] , has a prior history of schizophrenia who presented to [facility] Department for aggressive behavior .Pt continues to be  irritable and paranoid, will continue treatment.  Daily contact with patient to assess and evaluate symptoms and progress in treatment and Medication management Will increase Abilify to 10 mg po qhs for psychosis. Will continue Cogentin 0.5 mg po qhs for EPS. Will continueTrazodone 100 mg po qhs for sleep. Restarted Depakote ER 1000 mg po qhs for mood sx.Depakote level ON 10/12/15. Will make available PRN medications as per agitation protocol. Will continue to monitor vitals ,medication compliance and treatment side effects while patient is here.  Will monitor for medical issues as well as call consult as needed.  Reviewed labs cbc - wnl, cmp - wnl , reviewed lipid panel, hba1c from 5/17 UDS- NEGATIVE CSW will continue working on disposition. Obtained records from [facility].Writer also contacted [facility] - per them he was not on an injectable medication.  Patient to participate in therapeutic milieu      Elanora Quin, MD  10/10/2015, 10:38 AM

## 2015-10-11 NOTE — [facility] Group Notes (Signed)
[facility] Group Notes:  (Clinical Social Work)   01/11/2015     10:00-11:00AM  Summary of Progress/Problems:   In today's process group, patients listed one healthy and one unhealthy coping technique they utilize and there was a full discussion about more healthy ways to cope with problems and symptoms, and how to stay well out of the hospital.  Later, a list of 99 coping skills was used to discuss more ideas that they generally had not previously considered.   The patient expressed that the healthy and unhealthy coping he often uses is the same one, playing the video game [employer] of [employer], saying it is healthy because it builds up the dexterity in his hands while he is addicted to it and does little else, talks to strangers, etc.  He was in and out of the room many times, not engaged.  Type of Therapy:  Group Therapy - Process   Participation Level:  Minimal  Participation Quality:  Inattentive  Affect:  Blunted  Cognitive:  Disorganized  Insight:  Lacking  Engagement in Therapy:  Lacking  Modes of Intervention:  Education, Motivational Interviewing  Ambrose Mantle, LCSW 10/11/2015, 4:03 PM

## 2015-10-11 NOTE — Progress Notes (Signed)
Adult Psychoeducational Group Note  Date:  10/11/2015 Time:  8:30 PM  Group Topic/Focus:  Wrap-Up Group:   The focus of this group is to help patients review their daily goal of treatment and discuss progress on daily workbooks.   Participation Level:  Minimal  Participation Quality:  Appropriate  Affect:  Flat  Cognitive:  Alert  Insight: Lacking  Engagement in Group:  Limited  Modes of Intervention:  Discussion  Additional Comments:  Pt rated his day 10/10. His goal for tomorrow is to be entertained.  Kaleen Odea R 10/11/2015, 8:30 PM
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
## 2015-10-11 NOTE — Progress Notes (Signed)
Patient ID: [name] Love, male   DOB: 09-Aug-1995, 19 y.o.   MRN: [id number]   Pt currently presents with a blunted affect and cooperative behavior. Pt reports to writer that their goal is to "feel better." Pt reports good sleep with current medication regimen. Pt describes his situation at home in regards to his mental illness, pt reports that he has been contacted by an ACT team member but has yet to follow up with him. Pt reports that he has difficulty paying for his medications and sometimes food as well.  Pt provided with medications per providers orders. Pt's labs and vitals were monitored throughout the night. Pt supported emotionally and encouraged to express concerns and questions. Pt educated on medications and schizophrenia.   Pt's safety ensured with 15 minute and environmental checks. Pt endorses SI with no plan, reports vague thoughts and also reports VH, seeing shadows and colors in front of him. Pt currently denies HI and auditory hallucinations. Pt verbally agrees to seek staff if HI or auditory hallucinations occur and to consult with staff before acting on any harmful thoughts. Will continue POC. Pt would like to "play football" when he is discharged, reports with a smile "I don't care what position."

## 2015-10-11 NOTE — Progress Notes (Signed)
Data.Patient has been very quiet on the unit today. Minimal interaction with staff or patients, though he is often in the hall or common room. Patient continues to display significant delay prior to speech and often gives inappropriate answers to questions, or allow a sentence to drop, 1/2 way through. Continue to wear his comb in his hair. Blunt affect. Patient denies SI/HI/AVH.    Action. Emotional support and encouragement offered. Q 15 minute checks done for safety. Response. Safety on the unit maintained through 15 minute checks.   Attended groups. Remained calm and appropriate through out shift.

## 2015-10-11 NOTE — [facility] Group Notes (Signed)
[facility] Group Notes:  (Nursing/MHT/Case Management/Adjunct)  Date:  10/11/2015  Time:  6:28 PM  Type of Therapy:  Nurse Education  Participation Level:  Minimal  Participation Quality:  Inattentive  Affect:  Blunted and Flat  Cognitive:  Confused and Lacking  Insight:  Lacking  Engagement in Group:  Lacking  Modes of Intervention:  Problem-solving  Summary of Progress/Problems: Patient did attend group, but he needed significant prompting to answer the questions after catching the therapy ball. Very limited interactions and insights noted.  Almira Bar 10/11/2015, 6:28 PM

## 2015-10-12 LAB — VALPROIC ACID LEVEL: VALPROIC ACID LVL: 88 ug/mL (ref 50.0–100.0)

## 2015-10-12 NOTE — Progress Notes (Signed)
DAR NOTE: Pt present with flat affect and depressed mood in the unit. Pt observed playing chess with peers in the day room with no problems and denies physical pain. As per self inventory, pt had a poor night sleep, fair appetite, high energy, and good concentration. Pt rate depression at 10, hopeless ness at 10, and anxiety at 10. Pt's safety ensured with 15 minute and environmental checks. Pt currently denies SI/HI and A/V hallucinations. Pt verbally agrees to seek staff if SI/HI or A/VH occurs and to consult with staff before acting on these thoughts. Will continue POC.
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
## 2015-10-12 NOTE — Progress Notes (Signed)
Adult Psychoeducational Group Note  Date:  10/12/2015 Time:  8:42 PM  Group Topic/Focus:  Wrap-Up Group:   The focus of this group is to help patients review their daily goal of treatment and discuss progress on daily workbooks.   Participation Level:  Minimal  Participation Quality:  Attentive  Affect:  Flat  Cognitive:  Alert  Insight: Limited  Engagement in Group:  Engaged  Modes of Intervention:  Discussion  Additional Comments:  Pts were encouraged to discuss their day. Pt stated his day was powerful. Pt would not elaborate any further. Pt was encouraged to make his needs known to staff. Caswell Corwin 10/12/2015, 8:42 PM

## 2015-10-12 NOTE — Progress Notes (Signed)
Patient ID: [name] Love, male   DOB: Nov 21, 1995, 20 y.o.   MRN: [id number] D: Patient observed watching TV and interacting well with peers in dayroom. Pt appeared preoccupied but will not share with Clinical research associate. Pt later went and sat on floor at the end of hallway. Quiet but appeared to be in deep thought. Pt asked bizarre questions about cameras in the room and hallways. Asking if he can fix them because he is not able to see them. Denies SI/HI/AVH and pain. A: Support and encouragement offered as needed. Medications administered as prescribed.  R: Patient is safe on the unit. Will continue to monitor patient for safety and stability.

## 2015-10-12 NOTE — [facility] Group Notes (Signed)
[facility] Group Notes:  (Clinical Social Work)  10/12/2015  11:00AM-12:00PM  Summary of Progress/Problems:  The main focus of today's process group was to listen to a variety of genres of music and to identify that different types of music provoke different responses.  The patient then was able to identify personally what was soothing for them, as well as energizing, as well as how patient can personally use this knowledge in sleep habits, with depression, and with other symptoms.  The patient expressed at the beginning of group the overall feeling of "godly in my own way" and passionate.  He listened throughout the music, did not respond often, but when called on said he felt blessed.  Type of Therapy:  Music Therapy   Participation Level:  Active  Participation Quality:  Attentive  Affect:  Blunted  Cognitive:  Oriented  Insight:  Improving  Engagement in Therapy:  Engaged  Modes of Intervention:   Activity, Exploration  Ambrose Mantle, LCSW 10/12/2015

## 2015-10-13 MED ORDER — ARIPIPRAZOLE 15 MG PO TABS
15.0000 mg | ORAL_TABLET | Freq: Every day | ORAL | Status: DC
  Administered 2015-10-13: 15 mg via ORAL
  Filled 2015-10-13 (×3): qty 1

## 2015-10-13 NOTE — Progress Notes (Signed)
DAR NOTE: Patient presents with anxious affect and depressed mood.  Complained of headache, SI, but denied auditory and visual hallucinations. Pt also complained of chest pain V/S taken, EKG done and given to the Dr.  Rates depression at 9, hopelessness at 7, and anxiety at 4.  Maintained on routine safety checks.  Medications given as prescribed. Support and encouragement offered as needed.  States goal for today is "reroquel".  Patient observed socializing with peers in the dayroom.  Offered no complaint.

## 2015-10-13 NOTE — Progress Notes (Signed)
D: Pt denies SI, +ve for HI- different people, AVH- hearing cousins whispering in his ear. Pt is pleasant and cooperative. Pt   A: Pt was offered support and encouragement. Pt was given scheduled medications. Pt was encourage to attend groups. Q 15 minute checks were done for safety.   R:Pt attends groups and interacts well with peers and staff. Pt is taking medication. Pt has no complaints at this time.Pt receptive to treatment and safety maintained on unit.

## 2015-10-13 NOTE — Plan of Care (Signed)
Problem: Safety: Goal: Ability to remain free from injury will improve Outcome: Progressing Pt is safe and free from self harm

## 2015-10-13 NOTE — [facility] Group Notes (Signed)
[facility] LCSW Group Therapy  10/13/2015 3:38 PM  Type of Therapy:  Group Therapy  Participation Level:  Minimal  Participation Quality:  was called out of group shortly after it began  Affect:  Blunted  Cognitive:  Lacking  Insight:  Limited  Engagement in Therapy:  None  Modes of Intervention:  Discussion, Exploration and Problem-solving  Summary of Progress/Problems:  Patient was not present for group in any meaningful way.    [name] 10/13/2015, 3:38 PM

## 2015-10-13 NOTE — Progress Notes (Signed)
Adult Psychoeducational Group Note  Date:  10/13/2015 Time:  8:28 PM  Group Topic/Focus:  Wrap-Up Group:   The focus of this group is to help patients review their daily goal of treatment and discuss progress on daily workbooks.   Participation Level:  Active  Participation Quality:  Appropriate  Affect:  Appropriate  Cognitive:  Appropriate  Insight: Appropriate  Engagement in Group:  Engaged  Modes of Intervention:  Discussion  Additional Comments: The patient expressed that he attended groups [name] 10/13/2015, 8:28 PM
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
## 2015-10-13 NOTE — [facility] Group Notes (Signed)
[facility] LCSW Aftercare Discharge Planning Group Note   10/13/2015 1:02 PM  Participation Quality:  limited  Mood/Affect:  Flat  Depression Rating:  Unable to assess  Anxiety Rating:  Unable to assess   Current AVH:  pt able to respond appropriately to questions, however continues to display thought blocking and speech delay  Plan for Discharge/Comments:  Return to ACT team - [facility] despite pt insistence that it is [employer]: ACT Team vs family  Supports:  Providers and family  [name]

## 2015-10-14 MED ORDER — TUBERCULIN PPD 5 UNIT/0.1ML ID SOLN: 5.0000 [IU] | Freq: Once | INTRADERMAL | Status: DC

## 2015-10-14 MED ORDER — ARIPIPRAZOLE 10 MG PO TABS
20.0000 mg | ORAL_TABLET | Freq: Every day | ORAL | Status: DC
  Administered 2015-10-14 – 2015-10-15 (×2): 20 mg via ORAL
  Filled 2015-10-14 (×4): qty 2

## 2015-10-14 NOTE — [facility] Group Notes (Signed)
[facility] Group Notes:  (Nursing/MHT/Case Management/Adjunct)  Date:  10/14/2015  Time:  11:52 AM  Type of Therapy:  Nurse Education  Participation Level:  Minimal  Participation Quality:  Attentive  Affect:  Anxious and Blunted  Cognitive:  Disorganized  Insight:  Limited  Engagement in Group:  Poor  Modes of Intervention:  Discussion and Education  Summary of Progress/Problems: Topic was on Recovery.  Discussed what recovery means to the patient.  Emphasize recovery as a process and to celebrate any milestone or accomplishment during the process.  Patient was attentive and receptive. Ottie Glazier 10/14/2015, 11:52 AM

## 2015-10-14 NOTE — Progress Notes (Signed)
Recreation Therapy Notes  Animal-Assisted Activity (AAA) Program Checklist/Progress Notes Patient Eligibility Criteria Checklist & Daily Group note for Rec Tx Intervention  Date: 08.01.2017 Time: 2:45pm Location: [address]    AAA/T Program Assumption of Risk Form signed by Patient/ or Parent Legal Guardian Yes  Patient is free of allergies or sever asthma Yes  Patient reports no fear of animals Yes  Patient reports no history of cruelty to animals Yes  Patient understands his/her participation is voluntary Yes  Patient washes hands before animal contact Yes  Patient washes hands after animal contact Yes  Behavioral Response: Appropriate    Education: Hand Washing, Appropriate Animal Interaction   Education Outcome: Acknowledges education.   Clinical Observations/Feedback: Patient discussed with MD for appropriateness in pet therapy session. Both LRT and MD agree patient is appropriate for participation. Patient offered participation in session and signed necessary consent form without issue. Patient attended session for approximately 20 minutes before exiting. During time in session patient respectfully observed peer interaction with therapy dog and pet therapy dog when approached.   Marykay Lex Klarisa Barman, LRT/CTRS  Jasmon Graffam L 10/14/2015 4:08 PM

## 2015-10-14 NOTE — Progress Notes (Signed)
D:Affect is flat/blunted. Pt is very guarded/paranoid. He has refused PPD from multiple staff attempts and several prompts. Isolating at times in his room however has attended most groups and activities. He does appear to be responding to internal stimuli at times although he denies this when asked. A:Support and encouragement offered. R:Pt continues with minimal response and interactions with peers and staff.No complaints of pain or problems at this time.
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
## 2015-10-14 NOTE — [facility] Group Notes (Signed)
[facility] LCSW Group Therapy  10/14/2015 , 1:49 PM   Type of Therapy:  Group Therapy  Participation Level:  Active  Participation Quality:  Attentive  Affect:  Appropriate  Cognitive:  Alert  Insight:  Improving  Engagement in Therapy:  Engaged  Modes of Intervention:  Discussion, Exploration and Socialization  Summary of Progress/Problems: Today's group focused on the term Diagnosis.  Participants were asked to define the term, and then pronounce whether it is a negative, positive or neutral term.  Stayed the entire time, engaged throughout.  Moved around the room several times.  Limited participation. Abstract thinking is a challenge.  Daryel Gerald B 10/14/2015 , 1:49 PM

## 2015-10-14 NOTE — Progress Notes (Signed)
Adult Psychoeducational Group Note  Date:  10/14/2015 Time:  8:42 PM  Group Topic/Focus:  Wrap-Up Group:   The focus of this group is to help patients review their daily goal of treatment and discuss progress on daily workbooks.   Participation Level:  Active  Participation Quality:  Appropriate  Affect:  Appropriate  Cognitive:  Appropriate  Insight: Appropriate  Engagement in Group:  Engaged  Modes of Intervention:  Discussion  Additional Comments: The patient expressed that he attended group.The patient also said that he rates today a 4. [name] 10/14/2015, 8:42 PM

## 2015-10-15 NOTE — [place] FL2 (Signed)
[place] MEDICAID FL2 LEVEL OF CARE SCREENING TOOL     IDENTIFICATION  Patient Name: [name] Love Birthdate: 12/01/1995 Sex: male Admission Date (Current Location): 10/08/2015  [facility] and [place] Number:  Producer, television/film/video and Address:   [facility] [address])      Provider Number: [id number]  Attending Physician Name and Address:  Jomarie Longs, MD  Relative Name and Phone Number:       Current Level of Care: Hospital Recommended Level of Care: Family Care Home, Assisted Living Facility Prior Approval Number:    Date Approved/Denied:   PASRR Number: Pending  Discharge Plan: Other (Comment)    Current Diagnoses: Patient Active Problem List   Diagnosis Date Noted  . 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 Schizophrenia, paranoid type (HCC) 08/17/2015  . Hyperprolactinemia (HCC)   . Cannabis use disorder, severe, dependence (HCC) 11/26/2014    Orientation RESPIRATION BLADDER Height & Weight     Self, Time, Situation, Place  Normal Continent Weight: 209 lb (94.8 kg) Height:  5\' 10"  (177.8 cm)  BEHAVIORAL SYMPTOMS/MOOD NEUROLOGICAL BOWEL NUTRITION STATUS      Continent Diet (Regular)  AMBULATORY STATUS COMMUNICATION OF NEEDS Skin   Independent Verbally Normal                       Personal Care Assistance Level of Assistance  Bathing, Feeding, Dressing Bathing Assistance: Independent Feeding assistance: Independent Dressing Assistance: Independent     Functional Limitations Info  Sight, Hearing, Speech Sight Info: Adequate Hearing Info: Adequate Speech Info: Adequate    SPECIAL CARE FACTORS FREQUENCY                       Contractures Contractures Info: Not present    Additional Factors Info  Allergies   Allergies Info: Pollen extract, chocolate, tape, latex, peanuts Psychotropic Info: See MAR         Current Medications (10/15/2015):  This is the current hospital active medication list Current  Facility-Administered Medications  Medication Dose Route Frequency Provider Last Rate Last Dose  . acetaminophen (TYLENOL) tablet 650 mg  650 mg Oral Q4H PRN Charm Rings, NP   650 mg at 10/13/15 1201  . alum & mag hydroxide-simeth (MAALOX/MYLANTA) 200-200-20 MG/5ML suspension 30 mL  30 mL Oral PRN Charm Rings, NP      . ARIPiprazole (ABILIFY) tablet 20 mg  20 mg Oral QHS Jomarie Longs, MD   20 mg at 10/14/15 2221  . benztropine (COGENTIN) tablet 0.5 mg  0.5 mg Oral QHS Jomarie Longs, MD   0.5 mg at 10/14/15 2222  . divalproex (DEPAKOTE ER) 24 hr tablet 1,000 mg  1,000 mg Oral QHS Jomarie Longs, MD   1,000 mg at 10/14/15 2221  . ibuprofen (ADVIL,MOTRIN) tablet 600 mg  600 mg Oral Q8H PRN Charm Rings, NP      . magnesium hydroxide (MILK OF MAGNESIA) suspension 30 mL  30 mL Oral Daily PRN Jomarie Longs, MD      . ondansetron (ZOFRAN) tablet 4 mg  4 mg Oral Q8H PRN Charm Rings, NP      . traZODone (DESYREL) tablet 100 mg  100 mg Oral QHS PRN Jomarie Longs, MD   100 mg at 10/13/15 2111  . tuberculin injection 5 Units  5 Units Intradermal Once Jomarie Longs, MD         Discharge Medications: Please see discharge summary for a list of discharge  medications.  Relevant Imaging Results:  Relevant Lab Results:   Additional Information    [name], [place]

## 2015-10-15 NOTE — Progress Notes (Signed)
   D: When asked if he had any questions or concerns pt stated, "I don't know why I'm here". When asked pt stated that he plans to discharge Thurs or Fri. Pt plans to return home. Pt continues to be suspicious and doesn't engage the Clinical research associate in conversation. Pt has no questions or concerns.   A:  Support and encouragement was offered. 15 min checks continued for safety.  R: Pt remains safe.
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
## 2015-10-15 NOTE — Progress Notes (Signed)
DAR NOTE: Patient presents with anxious affect and depressed mood.  Denies pain, auditory and visual hallucinations.  Rates depression at 8, hopelessness at 8, and anxiety at 8.  Maintained on routine safety checks. Medications given as prescribed.  Support and encouragement offered as needed.  Patient observed socializing with peers in the dayroom.  Offered no complaint.
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
## 2015-10-15 NOTE — [facility] Group Notes (Signed)
[facility] Group Therapy  10/15/2015 , 1:05 PM    Type of Therapy:  [facility] Presentation  Participation Level:  Active  Participation Quality:  Attentive  Affect:  Blunted  Cognitive:  Oriented  Insight:  Limited  Engagement in Therapy:  Engaged  Modes of Intervention:  Discussion, Education and Socialization  Summary of Progress/Problems:  Onalee Hua from [facility] came to present his recovery story and play the guitar.  Stayed the entire time.  Appeared to be engaged.  Daryel Gerald B 10/15/2015 , 1:05 PM

## 2015-10-16 MED ORDER — ARIPIPRAZOLE ER 400 MG IM SUSR
400.0000 mg | INTRAMUSCULAR | Status: DC
  Administered 2015-10-16: 400 mg via INTRAMUSCULAR

## 2015-10-16 MED ORDER — ARIPIPRAZOLE 15 MG PO TABS
25.0000 mg | ORAL_TABLET | Freq: Every day | ORAL | Status: DC
  Administered 2015-10-16: 25 mg via ORAL
  Filled 2015-10-16 (×3): qty 1

## 2015-10-16 NOTE — Progress Notes (Signed)
   D: Pt continues to be suspicious. When given his meds, pt puts them in his hands and looks/reads them. Pt doesn't engage in open conversation with the Clinical research associate. Pt has no questions or concerns.    A:  Support and encouragement was offered. 15 min checks continued for safety.  R: Pt remains safe.

## 2015-10-16 NOTE — Progress Notes (Signed)
D: Patient paranoid and guarded during his interactions with staff and RN on assessment. Pt watchful and with an intense glare while speaking to others. Pt suspicious of his HS medications and is noted to attempt to taste each tab individually by licking them prior to taking them. Pt did attend group karaoke this shift.  A: Q 15 minute safety checks, encourage staff/peer interaction and group participation, administer medications as written. R: Pt with no s/s of distress noted this shift. Pt denies SI/HI.

## 2015-10-16 NOTE — [facility] Group Notes (Signed)
[facility] Group Notes:  (Nursing/MHT/Case Management/Adjunct)  Date:  10/16/2015  Time:  6:58 PM  Type of Therapy:  Nurse Education  Participation Level:  Active  Participation Quality:  Appropriate and Attentive  Affect:  Appropriate  Cognitive:  Alert and Appropriate  Insight:  Appropriate and Good  Engagement in Group:  Engaged  Modes of Intervention:  Discussion and Education   Summary of Progress/Problems: Topic was on leisure and lifestyle changes. Discussed the importance of choosing a healthy leisure activities. Group encouraged to surround themselves with positive and healthy group/support system when changing to a healthy lifestyle. Patient was receptive and contributed.   Mickie Bail 10/16/2015, 6:58 PM

## 2015-10-16 NOTE — Progress Notes (Signed)
DAR NOTE: Patient presents with flat affect and depressed mood.  Denies pain, auditory and visual hallucinations.  Described energy level as high and concentration as poor.  Rates depression at 10, hopelessness at 3, and anxiety at 3.  Maintained on routine safety checks.  Medications given as prescribed.  Support and encouragement offered as needed.  Attended group and participated.   Patient observed socializing with peers in the dayroom and hallway.  Tylenol 650 mg given for complain of headache with good effect.  Abilify 400 mg IM maintenance dose given after much encouragement and education.

## 2015-10-16 NOTE — Progress Notes (Signed)
  [facility] Adult Case Management Discharge Plan :  Will you be returning to the same living situation after discharge:  No. Shelter At discharge, do you have transportation home?: Yes,  ACT team Do you have the ability to pay for your medications: Yes,  MCD  Release of information consent forms completed and in the chart;  Patient's signature needed at discharge.  Patient to Follow up at: Follow-up Information    [facility]. Go today.   Why:  Someone from the ACT team will pick you up at 11:00 on the day of d/c. Contact information: [address] ,  [place], [place] [address] [phone]   Phone: [phone]  Fax:  [phone]          Next level of care provider has access to [facility] Link:no  Safety Planning and Suicide Prevention discussed: Yes,  yes  Have you used any form of tobacco in the last 30 days? (Cigarettes, Smokeless Tobacco, Cigars, and/or Pipes): No  Has patient been referred to the Quitline?: N/A patient is not a smoker  Patient has been referred for addiction treatment: N/A  [name] Love 10/16/2015, 11:55 AM
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
## 2015-10-16 NOTE — Progress Notes (Signed)
Patient attended karaoke group tonight.  

## 2015-10-16 NOTE — [facility] Group Notes (Signed)
[facility] Group Notes:  (Counselor/Nursing/MHT/Case Management/Adjunct)  10/16/2015 1:15PM  Type of Therapy:  Group Therapy  Participation Level:  Active  Participation Quality:  Appropriate  Affect:  Flat  Cognitive:  Oriented  Insight:  Improving  Engagement in Group:  Limited  Engagement in Therapy:  Limited  Modes of Intervention:  Discussion, Exploration and Socialization  Summary of Progress/Problems: The topic for group was balance in life.  Pt participated in the discussion about when their life was in balance and out of balance and how this feels.  Pt discussed ways to get back in balance and short term goals they can work on to get where they want to be. Stayed the entire time.  Moved around the room as he often does.  Shared that he feels unbalanced today.  "I've been trying to keep up with the Kardashians, but I haven't been able to."  Later, when asked what helps him get to a balanced, he replied "watching porn."  We moved on quickly from that topic.   [name] 10/16/2015 2:49 PM

## 2015-10-17 DIAGNOSIS — F200 Paranoid schizophrenia: Principal | ICD-10-CM

## 2015-10-17 MED ORDER — ARIPIPRAZOLE 5 MG PO TABS: 25.0000 mg | ORAL_TABLET | Freq: Every day | ORAL | 0 refills | Status: DC

## 2015-10-17 MED ORDER — TRAZODONE HCL 100 MG PO TABS: 100.0000 mg | ORAL_TABLET | Freq: Every evening | ORAL | 0 refills | Status: DC | PRN

## 2015-10-17 MED ORDER — DIVALPROEX SODIUM ER 500 MG PO TB24: 1000.0000 mg | ORAL_TABLET | Freq: Every day | ORAL | 0 refills | Status: DC

## 2015-10-17 MED ORDER — ARIPIPRAZOLE 10 MG PO TABS
25.0000 mg | ORAL_TABLET | Freq: Every day | ORAL | Status: DC
  Filled 2015-10-17: qty 3

## 2015-10-17 MED ORDER — BENZTROPINE MESYLATE 0.5 MG PO TABS: 0.5000 mg | ORAL_TABLET | Freq: Every day | ORAL | 0 refills | Status: DC

## 2015-10-17 MED ORDER — ARIPIPRAZOLE ER 400 MG IM SUSR: 400.0000 mg | INTRAMUSCULAR | 0 refills | Status: DC

## 2015-10-17 NOTE — Progress Notes (Signed)
Patient to discharged ambulatory to lobby with ACTT team transportation. All discharge paperwork given and signed, valuables returned. Prescriptions given. Patient able to verbalize understanding. Patient stable, denies SI/HI/AVH. Patient given opportunity to express concerns and ask questions.
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
## 2015-10-17 NOTE — Plan of Care (Signed)
Problem: Activity: Goal: Interest or engagement in activities will improve Outcome: Progressing Pt attended group session this shift.

## 2015-10-17 NOTE — BHH Suicide Risk Assessment (Signed)
[facility] Discharge Suicide Risk Assessment   Principal Problem: Schizophrenia, paranoid type [facility]) Discharge Diagnoses:  Patient Active Problem List   Diagnosis Date Noted  . 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 Cannabis use disorder, severe, dependence (HCC) [F12.20] 11/26/2014    Total Time spent with patient: 30 minutes  Musculoskeletal: Strength & Muscle Tone: within normal limits Gait & Station: normal Patient leans: N/A  Psychiatric Specialty Exam: Review of Systems  Psychiatric/Behavioral: Positive for hallucinations (improved- able to cope) and substance abuse. Negative for depression.  All other systems reviewed and are negative.   Blood pressure 126/72, pulse 89, temperature 98 F (36.7 C), temperature source Oral, resp. rate 18, height 5\' 10"  (1.778 m), weight 94.8 kg (209 lb), SpO2 98 %.Body mass index is 29.99 kg/m.  General Appearance: Casual  Eye Contact::  Fair  Speech:  Clear and Coherent409  Volume:  Normal  Mood:  Euthymic  Affect:  Congruent  Thought Process:  Goal Directed and Descriptions of Associations: Intact  Orientation:  Full (Time, Place, and Person)  Thought Content:  Logical  Suicidal Thoughts:  No  Homicidal Thoughts:  No  Memory:  Immediate;   Fair Recent;   Fair Remote;   Fair  Judgement:  Fair  Insight:  Fair  Psychomotor Activity:  Normal  Concentration:  Fair  Recall:  [employer] of Knowledge:Fair  Language: Fair  Akathisia:  No  Handed:  Right  AIMS (if indicated):     Assets:  Desire for Improvement  Sleep:  Number of Hours: 6.25  Cognition: WNL  ADL's:  Intact   Mental Status Per Nursing Assessment::   On Admission:  NA  Demographic Factors:  Male  Loss Factors: NA  Historical Factors: Impulsivity  Risk Reduction Factors:   Positive social support  Continued Clinical Symptoms:  Previous  Psychiatric Diagnoses and Treatments  Cognitive Features That Contribute To Risk:  Polarized thinking    Suicide Risk:  Minimal: No identifiable suicidal ideation.  Patients presenting with no risk factors but with morbid ruminations; may be classified as minimal risk based on the severity of the depressive symptoms  Follow-up Information    [facility]. Go today.   Why:  Someone from the ACT team will pick you up at 11:00 on the day of d/c. Contact information: [address] ,  [place], [place] [address] [phone]   Phone: [phone]  Fax:  [phone]          Plan Of Care/Follow-up recommendations:  Activity:  no restrictions Diet:  regular Tests:  as needed Other:  Abilify Maintena 400 mg IM - first dose 10/16/15  Russel Morain, MD 10/17/2015, 9:41 AM
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
## 2015-12-05 ENCOUNTER — Encounter (HOSPITAL_COMMUNITY): Payer: Self-pay | Admitting: Emergency Medicine

## 2015-12-05 ENCOUNTER — Emergency Department (HOSPITAL_COMMUNITY)
Admission: EM | Admit: 2015-12-05 | Discharge: 2015-12-05 | Disposition: A | Discharge status: Home / Self Care | Payer: Medicaid Other | Attending: Emergency Medicine | Admitting: Emergency Medicine

## 2015-12-05 DIAGNOSIS — Z046 Encounter for general psychiatric examination, requested by authority: Secondary | ICD-10-CM | POA: Diagnosis present

## 2015-12-05 DIAGNOSIS — F1910 Other psychoactive substance abuse, uncomplicated: Secondary | ICD-10-CM | POA: Diagnosis not present

## 2015-12-05 DIAGNOSIS — Z87891 Personal history of nicotine dependence: Secondary | ICD-10-CM | POA: Insufficient documentation

## 2015-12-05 DIAGNOSIS — F1290 Cannabis use, unspecified, uncomplicated: Secondary | ICD-10-CM | POA: Insufficient documentation

## 2015-12-05 DIAGNOSIS — Z91040 Latex allergy status: Secondary | ICD-10-CM | POA: Insufficient documentation

## 2015-12-05 NOTE — ED Provider Notes (Signed)
[facility]-EMERGENCY DEPT Provider Note   CSN: [id number] Arrival date & time: 12/05/15  1615     History   Chief Complaint Chief Complaint  Patient presents with  . Paranoid    HPI [name] Love is a 20 y.o. male.  HPI Patient has a history of schizophrenia and marijuana abuse. He came in because he's been using marijuana. Reportedly a Child psychotherapist told to come in. Patient denies suicidal or homicidal thoughts. He is overall not very forthcoming with the exam and history. States he is a little slow. States he just needs help with the marijuana knows it is bad for him. Denies hallucinations. Denies other substance abuse.   Past Medical History:  Diagnosis Date  . Schizophrenia, acute [facility])     Patient Active Problem List   Diagnosis Date Noted  . 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 Schizophrenia, paranoid type (HCC) 08/17/2015  . Hyperprolactinemia (HCC)   . Cannabis use disorder, severe, dependence (HCC) 11/26/2014    History reviewed. No pertinent surgical history.     Home Medications    Prior to Admission medications   Medication Sig Start Date End Date Taking? Authorizing Provider  ARIPiprazole (ABILIFY) 5 MG tablet Take 5 tablets (25 mg total) by mouth at bedtime. For mood control 10/17/15   Sanjuana Kava, NP  ARIPiprazole ER 400 MG SUSR Inject 400 mg into the muscle every 28 (twenty-eight) days. (Due to be given on 11-14-15): For mood control 11/14/15   Sanjuana Kava, NP  benztropine (COGENTIN) 0.5 MG tablet Take 1 tablet (0.5 mg total) by mouth at bedtime. For prevention of drug induced tremors 10/17/15   Sanjuana Kava, NP  divalproex (DEPAKOTE ER) 500 MG 24 hr tablet Take 2 tablets (1,000 mg total) by mouth at bedtime. For mood stabilization 10/17/15   Sanjuana Kava, NP  traZODone (DESYREL) 100 MG tablet Take 1 tablet (100 mg total) by mouth at bedtime as needed for sleep. 10/17/15   Sanjuana Kava, NP    Family History Family History  Problem Relation Age of  Onset  . Mental illness Cousin     Social History Social History  Substance Use Topics  . Smoking status: Former Smoker    Packs/day: 0.00    Years: 4.00  . Smokeless tobacco: Never Used  . Alcohol use No     Comment: "one time a month."     Allergies   Pollen extract; Chocolate; Latex; Peanuts [peanut oil]; and Tape   Review of Systems Review of Systems  Constitutional: Negative for appetite change.  Respiratory: Negative for shortness of breath.   Cardiovascular: Negative for chest pain.  Gastrointestinal: Negative for abdominal pain.  Genitourinary: Negative for flank pain.  Musculoskeletal: Negative for back pain.  Neurological: Negative for speech difficulty.  Psychiatric/Behavioral: Negative for confusion. The patient is nervous/anxious.      Physical Exam Updated Vital Signs BP 137/71 (BP Location: Left Arm)   Pulse 88   Temp 98.3 F (36.8 C) (Oral)   Resp 20   SpO2 100%   Physical Exam  Constitutional: He appears well-developed.  HENT:  Head: Atraumatic.  Neck: Neck supple.  Cardiovascular: Normal rate.   Pulmonary/Chest: Effort normal.  Abdominal: Soft.  Musculoskeletal: He exhibits no edema.  Neurological: He is alert.  Skin: Skin is warm.  Psychiatric:  Somewhat anxious.     ED Treatments / Results  Labs (all labs ordered are listed, but only abnormal results are displayed) Labs Reviewed  URINE RAPID  DRUG SCREEN, HOSP PERFORMED  COMPREHENSIVE METABOLIC PANEL  CBC WITH DIFFERENTIAL/PLATELET  ETHANOL  VALPROIC ACID LEVEL    EKG  EKG Interpretation None       Radiology No results found.  Procedures Procedures (including critical care time)  Medications Ordered in ED Medications - No data to display   Initial Impression / Assessment and Plan / ED Course  I have reviewed the triage vital signs and the nursing notes.  Pertinent labs & imaging results that were available during my care of the patient were reviewed by me and  considered in my medical decision making (see chart for details).  Clinical Course    Patient brought in reportedly for smoking marijuana. Does have a psychiatric history but this point does not want a psychiatric evaluation. Does not have suicidal or homicidal thoughts. Is requesting follow-up resources which will be given by TTS. Discussed with the TTS provider at this point does not appear to have criteria for IVC. Will discharge.  Final Clinical Impressions(s) / ED Diagnoses   Final diagnoses:  Substance abuse    New Prescriptions New Prescriptions   No medications on file     Benjiman CoreNathan Charnita Trudel, MD 12/05/15 1835

## 2015-12-05 NOTE — ED Notes (Signed)
We forgot to discharge patient upon sign out, around 6:15 pm today.  Patient declined vital signs and denied pain.  He was given a bus pass and shown where to get the bus.

## 2015-12-05 NOTE — ED Triage Notes (Signed)
Pt states his social worker dropped him off here wanting him to get some help since he has been smoking weed. Pt denies SI/HI but states "I think someone is out to kill me, the weed makes me think that way." Pt has hx of schizophrenia.

## 2015-12-05 NOTE — BH Assessment (Signed)
B[facility] Assessment Progress Note Patient was seen by this writer and Rubin PayorPickering MD to evaluate. Patient stated he came in for assistance to stop using Cannabis. Patient denied any S/I, H/I or AVH and was just asking for resources to assist with substance abuse issues. Patient was discharged with OP providers that could assist him and encouraged to follow up with a intensive outpatient program through the resources provided. TTS consult was discontinued by Rubin PayorPickering MD.

## 2016-03-09 ENCOUNTER — Encounter (HOSPITAL_COMMUNITY): Payer: Self-pay

## 2016-03-09 ENCOUNTER — Emergency Department (HOSPITAL_COMMUNITY)
Admission: EM | Admit: 2016-03-09 | Discharge: 2016-03-10 | Disposition: A | Discharge status: Home / Self Care | Payer: Medicaid Other | Attending: Emergency Medicine | Admitting: Emergency Medicine

## 2016-03-09 DIAGNOSIS — R441 Visual hallucinations: Secondary | ICD-10-CM | POA: Diagnosis present

## 2016-03-09 DIAGNOSIS — Z91010 Allergy to peanuts: Secondary | ICD-10-CM | POA: Insufficient documentation

## 2016-03-09 DIAGNOSIS — Z87891 Personal history of nicotine dependence: Secondary | ICD-10-CM | POA: Insufficient documentation

## 2016-03-09 DIAGNOSIS — Z818 Family history of other mental and behavioral disorders: Secondary | ICD-10-CM | POA: Diagnosis not present

## 2016-03-09 DIAGNOSIS — Z91040 Latex allergy status: Secondary | ICD-10-CM | POA: Diagnosis not present

## 2016-03-09 DIAGNOSIS — F200 Paranoid schizophrenia: Secondary | ICD-10-CM | POA: Diagnosis not present

## 2016-03-09 DIAGNOSIS — R443 Hallucinations, unspecified: Secondary | ICD-10-CM

## 2016-03-09 LAB — RAPID URINE DRUG SCREEN, HOSP PERFORMED
Amphetamines: NOT DETECTED
BENZODIAZEPINES: NOT DETECTED
Barbiturates: NOT DETECTED
COCAINE: NOT DETECTED
OPIATES: NOT DETECTED
Tetrahydrocannabinol: NOT DETECTED

## 2016-03-09 LAB — SALICYLATE LEVEL: Salicylate Lvl: <7 mg/dL (ref 2.8–30.0)

## 2016-03-09 LAB — COMPREHENSIVE METABOLIC PANEL
ALK PHOS: 70 U/L (ref 38–126)
ALT: 14 U/L — ABNORMAL LOW (ref 17–63)
AST: 19 U/L (ref 15–41)
Albumin: 4.7 g/dL (ref 3.5–5.0)
Anion gap: 10 (ref 5–15)
BILIRUBIN TOTAL: 0.9 mg/dL (ref 0.3–1.2)
BUN: 17 mg/dL (ref 6–20)
CALCIUM: 9.2 mg/dL (ref 8.9–10.3)
CO2: 22 mmol/L (ref 22–32)
Chloride: 103 mmol/L (ref 101–111)
Creatinine, Ser: 1.03 mg/dL (ref 0.61–1.24)
GFR calc Af Amer: >60 mL/min (ref >60)
Glucose, Bld: 70 mg/dL (ref 65–99)
POTASSIUM: 3.7 mmol/L (ref 3.5–5.1)
Sodium: 135 mmol/L (ref 135–145)
TOTAL PROTEIN: 7.8 g/dL (ref 6.5–8.1)

## 2016-03-09 LAB — CBC WITH DIFFERENTIAL/PLATELET
BASOS ABS: 0 10*3/uL (ref 0.0–0.1)
BASOS PCT: 1 %
EOS ABS: 0.2 10*3/uL (ref 0.0–0.7)
EOS PCT: 4 %
HCT: 41.3 % (ref 39.0–52.0)
Hemoglobin: 13.9 g/dL (ref 13.0–17.0)
Lymphocytes Relative: 41 %
Lymphs Abs: 1.7 10*3/uL (ref 0.7–4.0)
MCH: 29.2 pg (ref 26.0–34.0)
MCHC: 33.7 g/dL (ref 30.0–36.0)
MCV: 86.8 fL (ref 78.0–100.0)
MONO ABS: 0.4 10*3/uL (ref 0.1–1.0)
MONOS PCT: 9 %
NEUTROS ABS: 1.9 10*3/uL (ref 1.7–7.7)
Neutrophils Relative %: 45 %
PLATELETS: 218 10*3/uL (ref 150–400)
RBC: 4.76 MIL/uL (ref 4.22–5.81)
RDW: 12.8 % (ref 11.5–15.5)
WBC: 4.2 10*3/uL (ref 4.0–10.5)

## 2016-03-09 LAB — ETHANOL

## 2016-03-09 LAB — ACETAMINOPHEN LEVEL: Acetaminophen (Tylenol), Serum: <10 ug/mL — ABNORMAL LOW (ref 10–30)

## 2016-03-09 LAB — VALPROIC ACID LEVEL

## 2016-03-09 MED ORDER — DIVALPROEX SODIUM ER 500 MG PO TB24
1000.0000 mg | ORAL_TABLET | Freq: Every day | ORAL | Status: DC
  Administered 2016-03-09: 1000 mg via ORAL
  Filled 2016-03-09: qty 2

## 2016-03-09 MED ORDER — ARIPIPRAZOLE ER 400 MG IM PRSY
400.0000 mg | PREFILLED_SYRINGE | INTRAMUSCULAR | Status: DC
  Administered 2016-03-09: 400 mg via INTRAMUSCULAR

## 2016-03-09 MED ORDER — TRAZODONE HCL 100 MG PO TABS: 100.0000 mg | ORAL_TABLET | Freq: Every evening | ORAL | Status: DC | PRN

## 2016-03-09 MED ORDER — BENZTROPINE MESYLATE 0.5 MG PO TABS
0.5000 mg | ORAL_TABLET | Freq: Every day | ORAL | Status: DC
  Administered 2016-03-09: 0.5 mg via ORAL
  Filled 2016-03-09: qty 1

## 2016-03-09 MED ORDER — ARIPIPRAZOLE 5 MG PO TABS
25.0000 mg | ORAL_TABLET | Freq: Every day | ORAL | Status: DC
  Administered 2016-03-09: 25 mg via ORAL
  Filled 2016-03-09: qty 2
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
## 2016-03-09 NOTE — ED Triage Notes (Signed)
Pt mom called [employer] for patient to be evaluated.  Pt is schizophrenic.  Pt non specific on assessment.  States he is here to get his bp checked.  When questioned further, pt admits to visual hallucinations. "dark people in my head".  Denies SI at this time but states he has thoughts "often".  Last being 2 days ago.  Pt is volutary.  Denies physical complaints.

## 2016-03-09 NOTE — ED Notes (Signed)
Pt admitted to room # 42. Pt behavior cooperative, thought blocking noted. Pt denies SI/HI. Pt endorsing AVH. Pt reports "seeing things" is why he is at the hospital. Pt reports he sees "shadows" Pt also endorsing AH, pt reports that the voices "ask questions about myself. Pt reports he has been off his medication for 3 months d/t he "lost medication." Pt oriented to unit. Special checks q 15 mins in place for safety. Video monitoring in place.

## 2016-03-09 NOTE — ED Provider Notes (Signed)
[facility]-EMERGENCY DEPT Provider Note   CSN: [id number] Arrival date & time: 03/09/16  0957     History   Chief Complaint Chief Complaint  Patient presents with  . Hallucinations    HPI [name] Love is a 20 y.o. male.  HPI [name] Love is a 20 y.o. male with PMH significant for schizophrenia and marijuana abuse who presents with 3 month history of visual hallucinations stating he sees dark objects.  Denies auditory or tactile hallucinations.  No SI or HI.  No other complaints.  He is here voluntarily.  [facility] called for patient to be evaluated. He states he has been taking his medications.   Past Medical History:  Diagnosis Date  . Schizophrenia, acute [facility](HCC)     Patient Active Problem List   Diagnosis Date Noted  . 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 Schizophrenia, paranoid type (HCC) 08/17/2015  . Hyperprolactinemia (HCC)   . Cannabis use disorder, severe, dependence (HCC) 11/26/2014    History reviewed. No pertinent surgical history.     Home Medications    Prior to Admission medications   Medication Sig Start Date End Date Taking? Authorizing Provider  ARIPiprazole (ABILIFY) 5 MG tablet Take 5 tablets (25 mg total) by mouth at bedtime. For mood control 10/17/15  Yes Sanjuana KavaAgnes I Nwoko, NP  ARIPiprazole ER 400 MG SUSR Inject 400 mg into the muscle every 28 (twenty-eight) days. (Due to be given on 11-14-15): For mood control 11/14/15  Yes Sanjuana KavaAgnes I Nwoko, NP  benztropine (COGENTIN) 0.5 MG tablet Take 1 tablet (0.5 mg total) by mouth at bedtime. For prevention of drug induced tremors 10/17/15  Yes Sanjuana KavaAgnes I Nwoko, NP    Family History Family History  Problem Relation Age of Onset  . Mental illness Cousin     Social History Social History  Substance Use Topics  . Smoking status: Former Smoker    Packs/day: 0.00    Years: 4.00  . Smokeless tobacco: Never Used  . Alcohol use No     Comment: "one time a month."     Allergies     Pollen extract; Chocolate; Latex; Peanuts [peanut oil]; and Tape   Review of Systems Review of Systems All other systems negative unless otherwise stated in HPI   Physical Exam Updated Vital Signs BP 123/69 (BP Location: Left Arm)   Pulse 76   Temp 98.8 F (37.1 C) (Oral)   Resp 18   SpO2 100%   Physical Exam  Constitutional: He is oriented to person, place, and time. He appears well-developed and well-nourished.  Non-toxic appearance. He does not have a sickly appearance. He does not appear ill.  HENT:  Head: Normocephalic and atraumatic.  Mouth/Throat: Oropharynx is clear and moist.  Eyes: Conjunctivae are normal. Pupils are equal, round, and reactive to light.  Neck: Normal range of motion. Neck supple.  Cardiovascular: Normal rate and regular rhythm.   Pulmonary/Chest: Effort normal and breath sounds normal. No accessory muscle usage or stridor. No respiratory distress. He has no wheezes. He has no rhonchi. He has no rales.  Abdominal: Soft. Bowel sounds are normal. He exhibits no distension. There is no tenderness.  Musculoskeletal: Normal range of motion.  Lymphadenopathy:    He has no cervical adenopathy.  Neurological: He is alert and oriented to person, place, and time.  Speech clear without dysarthria.  Skin: Skin is warm and dry.  Psychiatric: He has a normal mood and affect. He is withdrawn and actively hallucinating (  reported). He expresses no homicidal and no suicidal ideation. He expresses no suicidal plans and no homicidal plans.     ED Treatments / Results  Labs (all labs ordered are listed, but only abnormal results are displayed) Labs Reviewed  COMPREHENSIVE METABOLIC PANEL - Abnormal; Notable for the following:       Result Value   ALT 14 (*)    All other components within normal limits  ACETAMINOPHEN LEVEL - Abnormal; Notable for the following:    Acetaminophen (Tylenol), Serum <10 (*)    All other components within normal limits  VALPROIC ACID  LEVEL - Abnormal; Notable for the following:    Valproic Acid Lvl <10 (*)    All other components within normal limits  ETHANOL  CBC WITH DIFFERENTIAL/PLATELET  RAPID URINE DRUG SCREEN, HOSP PERFORMED  SALICYLATE LEVEL    EKG  EKG Interpretation  Date/Time:  Saturday February 28 2016 12:43:29 EST Ventricular Rate:  51 PR Interval:  138 QRS Duration: 80 QT Interval:  392 QTC Calculation: 361 R Axis:   50 Text Interpretation:  Sinus bradycardia Septal infarct , age undetermined Abnormal ECG bradycardia new since July 2017 Confirmed by Criss AlvineGOLDSTON MD, SCOTT [id number]) on 03/09/2016 3:59:13 PM       Radiology No results found.  Procedures Procedures (including critical care time)  Medications Ordered in ED Medications  ARIPiprazole (ABILIFY) tablet 25 mg (not administered)  ARIPiprazole ER PRSY 400 mg (not administered)  benztropine (COGENTIN) tablet 0.5 mg (not administered)  divalproex (DEPAKOTE ER) 24 hr tablet 1,000 mg (not administered)  traZODone (DESYREL) tablet 100 mg (not administered)     Initial Impression / Assessment and Plan / ED Course  I have reviewed the triage vital signs and the nursing notes.  Pertinent labs & imaging results that were available during my care of the patient were reviewed by me and considered in my medical decision making (see chart for details).  Clinical Course    Patient presents with hx of schizophrenia who presents with visual hallucinations.  No SI or HI.  States he has been taking his medications. Here voluntarily.  Brought in by [facility] at mother's request for evaluation.  No other complaints.  Will check labs and consult TTS. Labs without acute abnormalities. Valproic acid level is subtherapeutic.  Patient is medically cleared. Per TTS, patient will be observed and evaluated by psychiatry in the morning.    Final Clinical Impressions(s) / ED Diagnoses   Final diagnoses:  Hallucinations    New Prescriptions New Prescriptions    No medications on file     Cheri FowlerKayla Kaelin Holford, Cordelia Poche-C 03/09/16 1638    Loren Raceravid Yelverton, MD 03/10/16 1553

## 2016-03-09 NOTE — ED Notes (Addendum)
Gave report to [employer] RN [facility]). Pt will be transported to room 42 with belongings. Called security to wand patient before transporting.

## 2016-03-09 NOTE — ED Notes (Signed)
Bed: [facility] Expected date:  Expected time:  Means of arrival:  Comments: 

## 2016-03-09 NOTE — ED Notes (Signed)
Patient is resting comfortably. 

## 2016-03-10 ENCOUNTER — Encounter (HOSPITAL_COMMUNITY): Payer: Self-pay | Admitting: Emergency Medicine

## 2016-03-10 DIAGNOSIS — Z87891 Personal history of nicotine dependence: Secondary | ICD-10-CM

## 2016-03-10 DIAGNOSIS — Z91040 Latex allergy status: Secondary | ICD-10-CM | POA: Diagnosis not present

## 2016-03-10 DIAGNOSIS — Z818 Family history of other mental and behavioral disorders: Secondary | ICD-10-CM | POA: Diagnosis not present

## 2016-03-10 DIAGNOSIS — Z79899 Other long term (current) drug therapy: Secondary | ICD-10-CM

## 2016-03-10 DIAGNOSIS — F200 Paranoid schizophrenia: Secondary | ICD-10-CM | POA: Diagnosis not present

## 2016-03-10 DIAGNOSIS — Z91018 Allergy to other foods: Secondary | ICD-10-CM

## 2016-03-10 DIAGNOSIS — Z91010 Allergy to peanuts: Secondary | ICD-10-CM

## 2016-03-10 MED ORDER — ARIPIPRAZOLE 10 MG PO TABS: 10.0000 mg | ORAL_TABLET | Freq: Every day | ORAL | Status: DC

## 2016-03-10 MED ORDER — ARIPIPRAZOLE 5 MG PO TABS: 25.0000 mg | ORAL_TABLET | Freq: Every day | ORAL | 0 refills | Status: DC

## 2016-03-10 MED ORDER — NICOTINE 21 MG/24HR TD PT24
21.0000 mg | MEDICATED_PATCH | Freq: Once | TRANSDERMAL | Status: DC
  Administered 2016-03-10: 21 mg via TRANSDERMAL
  Filled 2016-03-10: qty 1

## 2016-03-10 MED ORDER — BENZTROPINE MESYLATE 0.5 MG PO TABS: 0.5000 mg | ORAL_TABLET | Freq: Every day | ORAL | 0 refills | Status: DC

## 2016-03-10 MED ORDER — TRAZODONE HCL 100 MG PO TABS: 100.0000 mg | ORAL_TABLET | Freq: Every evening | ORAL | 0 refills | Status: DC | PRN

## 2016-03-10 MED ORDER — ARIPIPRAZOLE 10 MG PO TABS: 10.0000 mg | ORAL_TABLET | Freq: Every day | ORAL | 0 refills | Status: DC

## 2016-03-10 MED ORDER — DIVALPROEX SODIUM ER 500 MG PO TB24: 1000.0000 mg | ORAL_TABLET | Freq: Every day | ORAL | 0 refills | Status: DC

## 2016-03-10 NOTE — Progress Notes (Signed)
03/10/16 1348:  LRT introduced self to pt and offered activities.  Pt stated he wanted to play checkers.  LRT played checkers with pt.  Pt was flat but social at times.  Pt showed some confusion about the rules to the game at the beginning but pt seemed to remember as the game went on.  Caroll RancherMarjette Arilyn Brierley, LRT/CTRS

## 2016-03-10 NOTE — Consult Note (Signed)
[facility] Psychiatry Consult   Reason for Consult:  Hallucinations  Referring Physician:  EDP Patient Identification: [name] Love MRN:  [id number] Principal Diagnosis: Schizophrenia, paranoid type ([place]) Diagnosis:   Patient Active Problem List   Diagnosis Date Noted  . Schizophrenia, paranoid type ([place]) [F20.0] 08/17/2015    Priority: High  . 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 Hyperprolactinemia ([place]) [E22.1]   . Cannabis use disorder, severe, dependence ([place]) [F12.20] 11/26/2014    Total Time spent with patient: 45 minutes  Subjective:   [name] Love is a 20 y.o. male patient is "fine."  HPI:  20 yo male who presented to the ED with hallucinations after noncompliance with medications.  Abilify 400 mg injection started yesterday along with oral Abilify, Depakote, and Trazodone.  Today, he denies hallucinations, suicidal/homicidal ideations, and alcohol/drug abuse.  He requests to leave and return home where he lives with his mother.  He reports he will continue his oral medications and follow-up with [facility] for his injections and future medications.  Stable for discharge.  Past Psychiatric History: schizophrenia  Risk to Self: Suicidal Ideation: No Suicidal Intent: No Is patient at risk for suicide?: No Suicidal Plan?: No Access to Means: No What has been your use of drugs/alcohol within the last 12 months?:  (n/a) How many times?:  (0) Other Self Harm Risks:  (none reported) Triggers for Past Attempts: Other (Comment) (no triggers for past attempts or gestures ) Intentional Self Injurious Behavior: None Risk to Others: Homicidal Ideation: No Thoughts of Harm to Others: No Current Homicidal Intent: No Current Homicidal Plan: No Access to Homicidal Means: No Identified Victim:  (n/a) History of harm to others?: No Assessment of Violence: None Noted Violent Behavior Description:  (patient is calm and cooperative ) Does patient have access to  weapons?: No Criminal Charges Pending?: No Does patient have a court date: No Prior Inpatient Therapy: Prior Inpatient Therapy: Yes Prior Therapy Dates:  (2x's June 2017 and another admission pt is unable to recall) Prior Therapy Facilty/Provider(s):  ([facility], [facility], [facility]) Reason for Treatment:  (Schizophrenia) Prior Outpatient Therapy: Prior Outpatient Therapy: No Prior Therapy Dates:  (n/a) Prior Therapy Facilty/Provider(s):  (n/a) Reason for Treatment:  (n/a) Does patient have an ACCT team?: No Does patient have Intensive In-House Services?  : No Does patient have [facility] services? : No Does patient have [facility] services?: No  Past Medical History:  Past Medical History:  Diagnosis Date  . Schizophrenia, acute ([place])    History reviewed. No pertinent surgical history. Family History:  Family History  Problem Relation Age of Onset  . Mental illness Cousin    Family Psychiatric  History: none Social History:  History  Alcohol Use No    Comment: "one time a month."     History  Drug Use  . Types: Marijuana    Social History   Social History  . Marital status: Single    Spouse name: N/A  . Number of children: N/A  . Years of education: N/A   Social History Main Topics  . Smoking status: Former Smoker    Packs/day: 0.00    Years: 4.00  . Smokeless tobacco: Never Used  . Alcohol use No     Comment: "one time a month."  . Drug use:     Types: Marijuana  . Sexual activity: Not Currently   Other Topics Concern  . None   Social History Narrative  . None   Additional Social History:  Allergies:   Allergies  Allergen Reactions  . Pollen Extract Other (See Comments)    sneezing  . Chocolate Rash    unknown  . Latex Rash    Condoms only  . Peanuts [Peanut Oil] Rash  . Tape Rash    Labs:  Results for orders placed or performed during the hospital encounter of 03/09/16 (from the past 48 hour(s))  Comprehensive metabolic panel     Status:  Abnormal   Collection Time: 03/09/16 12:32 PM  Result Value Ref Range   Sodium 135 135 - 145 mmol/L   Potassium 3.7 3.5 - 5.1 mmol/L   Chloride 103 101 - 111 mmol/L   CO2 22 22 - 32 mmol/L   Glucose, Bld 70 65 - 99 mg/dL   BUN 17 6 - 20 mg/dL   Creatinine, Ser 1.03 0.61 - 1.24 mg/dL   Calcium 9.2 8.9 - 10.3 mg/dL   Total Protein 7.8 6.5 - 8.1 g/dL   Albumin 4.7 3.5 - 5.0 g/dL   AST 19 15 - 41 U/L   ALT 14 (L) 17 - 63 U/L   Alkaline Phosphatase 70 38 - 126 U/L   Total Bilirubin 0.9 0.3 - 1.2 mg/dL   GFR calc non Af Amer >60 >60 mL/min   GFR calc Af Amer >60 >60 mL/min    Comment: (NOTE) The eGFR has been calculated using the CKD EPI equation. This calculation has not been validated in all clinical situations. eGFR's persistently <60 mL/min signify possible Chronic Kidney Disease.    Anion gap 10 5 - 15  Ethanol     Status: None   Collection Time: 03/09/16 12:32 PM  Result Value Ref Range   Alcohol, Ethyl (B) <5 <5 mg/dL    Comment:        LOWEST DETECTABLE LIMIT FOR SERUM ALCOHOL IS 5 mg/dL FOR MEDICAL PURPOSES ONLY   CBC with Diff     Status: None   Collection Time: 03/09/16 12:32 PM  Result Value Ref Range   WBC 4.2 4.0 - 10.5 K/uL   RBC 4.76 4.22 - 5.81 MIL/uL   Hemoglobin 13.9 13.0 - 17.0 g/dL   HCT 41.3 39.0 - 52.0 %   MCV 86.8 78.0 - 100.0 fL   MCH 29.2 26.0 - 34.0 pg   MCHC 33.7 30.0 - 36.0 g/dL   RDW 12.8 11.5 - 15.5 %   Platelets 218 150 - 400 K/uL   Neutrophils Relative % 45 %   Neutro Abs 1.9 1.7 - 7.7 K/uL   Lymphocytes Relative 41 %   Lymphs Abs 1.7 0.7 - 4.0 K/uL   Monocytes Relative 9 %   Monocytes Absolute 0.4 0.1 - 1.0 K/uL   Eosinophils Relative 4 %   Eosinophils Absolute 0.2 0.0 - 0.7 K/uL   Basophils Relative 1 %   Basophils Absolute 0.0 0.0 - 0.1 K/uL  Salicylate level     Status: None   Collection Time: 03/09/16 12:32 PM  Result Value Ref Range   Salicylate Lvl <2.2 2.8 - 30.0 mg/dL  Acetaminophen level     Status: Abnormal    Collection Time: 03/09/16 12:32 PM  Result Value Ref Range   Acetaminophen (Tylenol), Serum <10 (L) 10 - 30 ug/mL    Comment:        THERAPEUTIC CONCENTRATIONS VARY SIGNIFICANTLY. A RANGE OF 10-30 ug/mL MAY BE AN EFFECTIVE CONCENTRATION FOR MANY PATIENTS. HOWEVER, SOME ARE [place] TREATED AT CONCENTRATIONS OUTSIDE THIS RANGE. ACETAMINOPHEN CONCENTRATIONS >150 ug/mL AT 4  HOURS AFTER INGESTION AND >50 ug/mL AT 12 HOURS AFTER INGESTION ARE OFTEN ASSOCIATED WITH TOXIC REACTIONS.   Valproic acid level     Status: Abnormal   Collection Time: 03/09/16 12:32 PM  Result Value Ref Range   Valproic Acid Lvl <10 (L) 50.0 - 100.0 ug/mL    Comment: RESULTS CONFIRMED BY MANUAL DILUTION  Urine rapid drug screen (hosp performed)not at [facility]     Status: None   Collection Time: 03/09/16  1:54 PM  Result Value Ref Range   Opiates NONE DETECTED NONE DETECTED   Cocaine NONE DETECTED NONE DETECTED   Benzodiazepines NONE DETECTED NONE DETECTED   Amphetamines NONE DETECTED NONE DETECTED   Tetrahydrocannabinol NONE DETECTED NONE DETECTED   Barbiturates NONE DETECTED NONE DETECTED    Comment:        DRUG SCREEN FOR MEDICAL PURPOSES ONLY.  IF CONFIRMATION IS NEEDED FOR ANY PURPOSE, NOTIFY LAB WITHIN 5 DAYS.        LOWEST DETECTABLE LIMITS FOR URINE DRUG SCREEN Drug Class       Cutoff (ng/mL) Amphetamine      1000 Barbiturate      200 Benzodiazepine   287 Tricyclics       681 Opiates          300 Cocaine          300 THC              50     Current Facility-Administered Medications  Medication Dose Route Frequency Provider Last Rate Last Dose  . ARIPiprazole (ABILIFY) tablet 25 mg  25 mg Oral QHS Gloriann Loan, PA-C   25 mg at 03/09/16 2110  . ARIPiprazole ER PRSY 400 mg  400 mg Intramuscular Q28 days Gloriann Loan, PA-C   400 mg at 03/09/16 1638  . benztropine (COGENTIN) tablet 0.5 mg  0.5 mg Oral QHS Gloriann Loan, PA-C   0.5 mg at 03/09/16 2111  . divalproex (DEPAKOTE ER) 24 hr tablet 1,000 mg   1,000 mg Oral QHS Gloriann Loan, PA-C   1,000 mg at 03/09/16 2110  . nicotine (NICODERM CQ - dosed in mg/24 hours) patch 21 mg  21 mg Transdermal Once Patrecia Pour, NP   21 mg at 03/10/16 1133  . traZODone (DESYREL) tablet 100 mg  100 mg Oral QHS PRN Gloriann Loan, PA-C       Current Outpatient Prescriptions  Medication Sig Dispense Refill  . ARIPiprazole (ABILIFY) 5 MG tablet Take 5 tablets (25 mg total) by mouth at bedtime. For mood control 150 tablet 0  . ARIPiprazole ER 400 MG SUSR Inject 400 mg into the muscle every 28 (twenty-eight) days. (Due to be given on 11-14-15): For mood control 1 each 0  . benztropine (COGENTIN) 0.5 MG tablet Take 1 tablet (0.5 mg total) by mouth at bedtime. For prevention of drug induced tremors 30 tablet 0    Musculoskeletal: Strength & Muscle Tone: within normal limits Gait & Station: normal Patient leans: N/A  Psychiatric Specialty Exam: Physical Exam  Constitutional: He is oriented to person, place, and time. He appears well-developed and well-nourished.  HENT:  Head: Normocephalic.  Neck: Normal range of motion.  Respiratory: Effort normal.  Musculoskeletal: Normal range of motion.  Neurological: He is alert and oriented to person, place, and time.  Psychiatric: He has a normal mood and affect. His speech is normal. Judgment and thought content normal. He is actively hallucinating. Cognition and memory are normal.    Review of Systems  Psychiatric/Behavioral: Positive for hallucinations.  All other systems reviewed and are negative.   Blood pressure 130/64, pulse 72, temperature 99.2 F (37.3 C), temperature source Oral, resp. rate 16, SpO2 100 %.There is no height or weight on file to calculate BMI.  General Appearance: Casual  Eye Contact:  Good  Speech:  Normal Rate  Volume:  Normal  Mood:  Euthymic  Affect:  Blunt  Thought Process:  Coherent and Descriptions of Associations: Intact  Orientation:  Full (Time, Place, and Person)  Thought  Content:  Hallucinations: Auditory, denies on assessment  Suicidal Thoughts:  No  Homicidal Thoughts:  No  Memory:  Immediate;   Good Recent;   Good Remote;   Good  Judgement:  Fair  Insight:  Fair  Psychomotor Activity:  Normal  Concentration:  Concentration: Good and Attention Span: Good  Recall:  Good  Fund of Knowledge:  Fair  Language:  Good  Akathisia:  No  Handed:  Right  AIMS (if indicated):     Assets:  Housing Leisure Time Physical Health Resilience Social Support  ADL's:  Intact  Cognition:  WNL  Sleep:        Treatment Plan Summary: Daily contact with patient to assess and evaluate symptoms and progress in treatment, Medication management and Plan schizophrenia, paranoid type:  -Crisis stabilization -Medication management:  Start Depakote 1000 mg at bedtime for mood stabilization, Cogentin 0.5 mg at bedtime for EPS, Abilify 10 mg daily for psychosis, and Trazodone 100 mg at bedtime for sleep -Individual counseling  Disposition: No evidence of imminent risk to self or others at present.    Waylan Boga, NP 03/10/2016 2:12 PM

## 2016-03-10 NOTE — ED Notes (Signed)
Pt d/c home per MD order. This nurse reviewed discharge summary with pt. Pt verbalizes understanding of discharge summary and outpatient resources. RX provided. Pt denies SI/HI/AVH at discharge. Bus pass given per pt request, pt reports he has got in contact with his mother. Pt signed for personal property and property returned. Pt signed e-signature.  Ambulatory off unit with [facility].

## 2016-03-10 NOTE — Discharge Instructions (Signed)
For your ongoing behavioral health needs, you are advised to continue treatment with [facility] and with [facility]:       [facility]      [address]      [place], [place] [address]      [phone]       [facility]      [address]      [place], [place] [address]      [phone]

## 2016-03-10 NOTE — [facility] Suicide Risk Assessment (Signed)
Suicide Risk Assessment  Discharge Assessment   [facility]   Principal Problem: Schizophrenia, paranoid type [facility](HCC) Discharge Diagnoses:  Patient Active Problem List   Diagnosis Date Noted  . Schizophrenia, paranoid type (HCC) [F20.0] 08/17/2015    Priority: High  . 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 Cannabis use disorder, severe, dependence (HCC) [F12.20] 11/26/2014    Total Time spent with patient: 45 minutes  Musculoskeletal: Strength & Muscle Tone: within normal limits Gait & Station: normal Patient leans: N/A  Psychiatric Specialty Exam: Physical Exam  Constitutional: He is oriented to person, place, and time. He appears well-developed and well-nourished.  HENT:  Head: Normocephalic.  Neck: Normal range of motion.  Respiratory: Effort normal.  Musculoskeletal: Normal range of motion.  Neurological: He is alert and oriented to person, place, and time.  Psychiatric: He has a normal mood and affect. His speech is normal. Judgment and thought content normal. He is actively hallucinating. Cognition and memory are normal.    Review of Systems  Psychiatric/Behavioral: Positive for hallucinations.  All other systems reviewed and are negative.   Blood pressure 130/64, pulse 72, temperature 99.2 F (37.3 C), temperature source Oral, resp. rate 16, SpO2 100 %.There is no height or weight on file to calculate BMI.  General Appearance: Casual  Eye Contact:  Good  Speech:  Normal Rate  Volume:  Normal  Mood:  Euthymic  Affect:  Blunt  Thought Process:  Coherent and Descriptions of Associations: Intact  Orientation:  Full (Time, Place, and Person)  Thought Content:  Hallucinations: Auditory, denies on assessment  Suicidal Thoughts:  No  Homicidal Thoughts:  No  Memory:  Immediate;   Good Recent;   Good Remote;   Good  Judgement:  Fair  Insight:  Fair  Psychomotor Activity:  Normal   Concentration:  Concentration: Good and Attention Span: Good  Recall:  Good  Fund of Knowledge:  Fair  Language:  Good  Akathisia:  No  Handed:  Right  AIMS (if indicated):     Assets:  Housing Leisure Time Physical Health Resilience Social Support  ADL's:  Intact  Cognition:  WNL  Sleep:      Mental Status Per Nursing Assessment::   On Admission:   hallucinations  Demographic Factors:  Male and Adolescent or young adult  Loss Factors: NA  Historical Factors: NA  Risk Reduction Factors:   Sense of responsibility to family, Living with another person, especially a relative, Positive social support and Positive therapeutic relationship  Continued Clinical Symptoms:  Denies  Cognitive Features That Contribute To Risk:  None    Suicide Risk:  Minimal: No identifiable suicidal ideation.  Patients presenting with no risk factors but with morbid ruminations; may be classified as minimal risk based on the severity of the depressive symptoms    Plan Of Care/Follow-up recommendations:  Activity:  as tolerated Diet:  heart healthy diet  Kasey Hansell, NP 03/10/2016, 2:28 PM

## 2016-03-10 NOTE — ED Notes (Signed)
Pt visible on the unit throughout the shift. Pt guarded on approach, forwards little with this nurse, encourgement and support provided. Pt denies SI/HI. Denies AVH. Special checks q 15 mins in place for safety. Video monitoring in place.

## 2016-03-10 NOTE — [facility] Assessment (Signed)
[facility] Assessment Progress Note  Per [name] RoutArchana Kumar, MD, this pt does not require psychiatric hospitalization at this time.  At her request, this writer called pt's mother, [name] Love [phone]) after having pt sign Consent to Release Information, to notify her and arrange for follow up.  Pt also granted consent to release information to [name] Love and to [name] Love, but [name] reports that pt no longer sees [name].  He currently receives treatment from [name] Love, as well as [name] Love.  Discharge instructions advise pt to follow up with both of these providers, and a notification call has been placed to [name] Love.  Pt's nurse, [name] Sheldonshley, has been notified.  [name], MA Triage Specialist [phone]

## 2016-03-19 ENCOUNTER — Encounter (HOSPITAL_COMMUNITY): Payer: Self-pay

## 2016-03-19 ENCOUNTER — Emergency Department (HOSPITAL_COMMUNITY)
Admission: EM | Admit: 2016-03-19 | Discharge: 2016-03-23 | Disposition: A | Discharge status: Other Acute Inpatient Hospital | Payer: Medicaid Other | Attending: Emergency Medicine | Admitting: Emergency Medicine

## 2016-03-19 DIAGNOSIS — Z91040 Latex allergy status: Secondary | ICD-10-CM | POA: Insufficient documentation

## 2016-03-19 DIAGNOSIS — Z79899 Other long term (current) drug therapy: Secondary | ICD-10-CM | POA: Insufficient documentation

## 2016-03-19 DIAGNOSIS — R4689 Other symptoms and signs involving appearance and behavior: Secondary | ICD-10-CM

## 2016-03-19 DIAGNOSIS — R4183 Borderline intellectual functioning: Secondary | ICD-10-CM | POA: Diagnosis not present

## 2016-03-19 DIAGNOSIS — F200 Paranoid schizophrenia: Secondary | ICD-10-CM | POA: Diagnosis not present

## 2016-03-19 DIAGNOSIS — E221 Hyperprolactinemia: Secondary | ICD-10-CM | POA: Diagnosis not present

## 2016-03-19 DIAGNOSIS — Z91010 Allergy to peanuts: Secondary | ICD-10-CM | POA: Insufficient documentation

## 2016-03-19 DIAGNOSIS — Z87891 Personal history of nicotine dependence: Secondary | ICD-10-CM | POA: Insufficient documentation

## 2016-03-19 DIAGNOSIS — F918 Other conduct disorders: Secondary | ICD-10-CM | POA: Diagnosis not present

## 2016-03-19 DIAGNOSIS — F419 Anxiety disorder, unspecified: Secondary | ICD-10-CM | POA: Diagnosis present

## 2016-03-19 DIAGNOSIS — Z818 Family history of other mental and behavioral disorders: Secondary | ICD-10-CM | POA: Diagnosis not present

## 2016-03-19 LAB — CBC WITH DIFFERENTIAL/PLATELET
Basophils Absolute: 0 10*3/uL (ref 0.0–0.1)
Basophils Relative: 0 %
EOS ABS: 0.3 10*3/uL (ref 0.0–0.7)
Eosinophils Relative: 5 %
HCT: 38.8 % — ABNORMAL LOW (ref 39.0–52.0)
HEMOGLOBIN: 12.7 g/dL — AB (ref 13.0–17.0)
LYMPHS ABS: 2 10*3/uL (ref 0.7–4.0)
Lymphocytes Relative: 38 %
MCH: 28.9 pg (ref 26.0–34.0)
MCHC: 32.7 g/dL (ref 30.0–36.0)
MCV: 88.4 fL (ref 78.0–100.0)
MONO ABS: 0.5 10*3/uL (ref 0.1–1.0)
MONOS PCT: 10 %
NEUTROS PCT: 47 %
Neutro Abs: 2.4 10*3/uL (ref 1.7–7.7)
Platelets: 232 10*3/uL (ref 150–400)
RBC: 4.39 MIL/uL (ref 4.22–5.81)
RDW: 13.4 % (ref 11.5–15.5)
WBC: 5.1 10*3/uL (ref 4.0–10.5)

## 2016-03-19 LAB — RAPID URINE DRUG SCREEN, HOSP PERFORMED
Amphetamines: NOT DETECTED
Barbiturates: NOT DETECTED
Benzodiazepines: NOT DETECTED
Cocaine: NOT DETECTED
OPIATES: NOT DETECTED
TETRAHYDROCANNABINOL: NOT DETECTED

## 2016-03-19 LAB — COMPREHENSIVE METABOLIC PANEL
ALK PHOS: 74 U/L (ref 38–126)
ALT: 21 U/L (ref 17–63)
AST: 50 U/L — AB (ref 15–41)
Albumin: 4.9 g/dL (ref 3.5–5.0)
Anion gap: 7 (ref 5–15)
BILIRUBIN TOTAL: 0.5 mg/dL (ref 0.3–1.2)
BUN: 11 mg/dL (ref 6–20)
CALCIUM: 9.4 mg/dL (ref 8.9–10.3)
CO2: 24 mmol/L (ref 22–32)
CREATININE: 0.95 mg/dL (ref 0.61–1.24)
Chloride: 106 mmol/L (ref 101–111)
GFR calc Af Amer: >60 mL/min (ref >60)
GFR calc non Af Amer: >60 mL/min (ref >60)
GLUCOSE: 86 mg/dL (ref 65–99)
Potassium: 4.2 mmol/L (ref 3.5–5.1)
SODIUM: 137 mmol/L (ref 135–145)
Total Protein: 8.3 g/dL — ABNORMAL HIGH (ref 6.5–8.1)

## 2016-03-19 LAB — ETHANOL: Alcohol, Ethyl (B): <5 mg/dL (ref <5)

## 2016-03-19 LAB — VALPROIC ACID LEVEL: Valproic Acid Lvl: <10 ug/mL — ABNORMAL LOW (ref 50.0–100.0)

## 2016-03-19 NOTE — ED Notes (Signed)
Hourly rounding reveals patient sleeping in room. No complaints, stable, in no acute distress. Q15 minute rounds and monitoring via Security Cameras to continue. 

## 2016-03-19 NOTE — ED Triage Notes (Signed)
Pt is schizophrenic.  When questioning patient regarding visit, answers nonspecific.  Pt states he does take his meds.  He is not hearing or seeing anything.  He has no thoughts of hurting himself or anyone else.  When questioning further that "what can we help with today.  Pt states he gets anxious.  Does live on his own.  Is not working. States he drove himself here today.  Saw psychiatry on Tuesday and given xanax.  Pt very cautious in speech during interview.
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
## 2016-03-19 NOTE — ED Notes (Signed)
Report to include situation, background, assessment and recommendations from Edie RN. Patient sleeping, respirations regular and unlabored. Q15 minute rounds and security camera observation to continue.   

## 2016-03-19 NOTE — BH Assessment (Signed)
Clinical information under review: [facility], [facility], [facility], [place], [facility], [facility], [address], [place], [address], [facility], [facility]

## 2016-03-19 NOTE — ED Notes (Signed)
This patient was oriented to room and unit.  He is pleasant and cooperative and asked for crayons and paper upon arrival.  He denies S/I and H/I.  He states he "hears voices and feels a dark presence in my chest"  He has a very flat affect.  15 minute checks and video monitoring in place.
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
## 2016-03-20 DIAGNOSIS — Z91010 Allergy to peanuts: Secondary | ICD-10-CM

## 2016-03-20 DIAGNOSIS — E221 Hyperprolactinemia: Secondary | ICD-10-CM | POA: Diagnosis not present

## 2016-03-20 DIAGNOSIS — Z9109 Other allergy status, other than to drugs and biological substances: Secondary | ICD-10-CM

## 2016-03-20 DIAGNOSIS — Z9102 Food additives allergy status: Secondary | ICD-10-CM

## 2016-03-20 DIAGNOSIS — Z91040 Latex allergy status: Secondary | ICD-10-CM

## 2016-03-20 DIAGNOSIS — R4183 Borderline intellectual functioning: Secondary | ICD-10-CM | POA: Diagnosis not present

## 2016-03-20 DIAGNOSIS — Z79899 Other long term (current) drug therapy: Secondary | ICD-10-CM

## 2016-03-20 DIAGNOSIS — F200 Paranoid schizophrenia: Secondary | ICD-10-CM | POA: Diagnosis not present

## 2016-03-20 DIAGNOSIS — Z818 Family history of other mental and behavioral disorders: Secondary | ICD-10-CM | POA: Diagnosis not present

## 2016-03-20 DIAGNOSIS — Z87891 Personal history of nicotine dependence: Secondary | ICD-10-CM

## 2016-03-20 MED ORDER — TRAZODONE HCL 100 MG PO TABS: 100.0000 mg | ORAL_TABLET | Freq: Every evening | ORAL | Status: DC | PRN

## 2016-03-20 MED ORDER — ARIPIPRAZOLE 10 MG PO TABS: 10.0000 mg | ORAL_TABLET | Freq: Every day | ORAL | Status: DC

## 2016-03-20 MED ORDER — HYDROXYZINE HCL 25 MG PO TABS
25.0000 mg | ORAL_TABLET | Freq: Two times a day (BID) | ORAL | Status: DC
  Administered 2016-03-20 – 2016-03-22 (×5): 25 mg via ORAL
  Filled 2016-03-20 (×5): qty 1

## 2016-03-20 MED ORDER — ARIPIPRAZOLE ER 400 MG IM SRER
400.0000 mg | INTRAMUSCULAR | Status: DC
  Administered 2016-03-20: 400 mg via INTRAMUSCULAR

## 2016-03-20 MED ORDER — ASENAPINE MALEATE 5 MG SL SUBL: 10.0000 mg | SUBLINGUAL_TABLET | Freq: Two times a day (BID) | SUBLINGUAL | Status: DC | PRN

## 2016-03-20 MED ORDER — BENZTROPINE MESYLATE 0.5 MG PO TABS: 0.5000 mg | ORAL_TABLET | Freq: Every day | ORAL | Status: DC

## 2016-03-20 MED ORDER — DIVALPROEX SODIUM ER 500 MG PO TB24: 1000.0000 mg | ORAL_TABLET | Freq: Every day | ORAL | Status: DC

## 2016-03-20 MED ORDER — CARBAMAZEPINE 200 MG PO TABS
200.0000 mg | ORAL_TABLET | Freq: Two times a day (BID) | ORAL | Status: DC
  Administered 2016-03-20 – 2016-03-22 (×5): 200 mg via ORAL
  Filled 2016-03-20 (×4): qty 1

## 2016-03-20 NOTE — ED Notes (Signed)
Hourly rounding reveals patient in hall. No complaints, stable, in no acute distress. Q15 minute rounds and monitoring via Security Cameras to continue. 

## 2016-03-20 NOTE — ED Notes (Signed)
Hourly rounding reveals patient sleeping in room. No complaints, stable, in no acute distress. Q15 minute rounds and monitoring via Security Cameras to continue. 

## 2016-03-20 NOTE — ED Notes (Signed)
Report to include Situation, Background, Assessment, and Recommendations received from [facility] RN. Patient alert and oriented, warm and dry, in no acute distress. Patient denies SI, HI, AH and pain. Patient states he sees "lines". Patient made aware of Q15 minute rounds and security cameras for their safety. Patient instructed to come to me with needs or concerns.

## 2016-03-20 NOTE — Progress Notes (Signed)
CSW contacted [facility], [address], [facility], [facility], [place] and [place] to inquire about patient's referral. [facility] employee reported that patient's referral was not on file. CSW was unable to reach anyone at [facility] to inquire about [facility]'s referral. CSW was unable to reach anyone at [facility] to inquire about patient's referral. [facility] reported that they had no beds available. [facility] employee reported that the staff member that reviews patient referrals was unavailable and requested patient's information. [facility] employee reported that patient's referral was not on file.  CSW faxed patient's referral to: [facility].
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
## 2016-03-20 NOTE — ED Notes (Signed)
Pt has been calm and cooperative today.  He is compliant with his meds.  He denies S/I, H/I, and AVH.  15 minute checks and video monitoring continue.

## 2016-03-20 NOTE — ED Notes (Signed)
Hourly rounding reveals patient in room. No complaints, stable, in no acute distress. Q15 minute rounds and monitoring via Security Cameras to continue. 

## 2016-03-21 NOTE — ED Notes (Signed)
Hourly rounding reveals patient sleeping in room. No complaints, stable, in no acute distress. Q15 minute rounds and monitoring via Security Cameras to continue. 

## 2016-03-21 NOTE — ED Notes (Signed)
Hourly rounding reveals patient in room. No complaints, stable, in no acute distress. Q15 minute rounds and monitoring via Security Cameras to continue. 

## 2016-03-21 NOTE — ED Notes (Signed)
Report to include Situation, Background, Assessment, and Recommendations received from [facility]. Patient alert and oriented, warm and dry, in no acute distress. Patient denies SI, HI, AH and pain. Patient states that he sees"Black Lines". Patient made aware of Q15 minute rounds and security cameras for their safety. Patient instructed to come to me with needs or concerns.

## 2016-03-21 NOTE — BHH Counselor (Signed)
[facility] Assessment Progress Note  Clinician rec'd t/c from [facility] Ha[facility] requesting a copy of the psychiatric assessment completed on pt yesterday. Fax # is [phone]. Information faxed today at 1830.  Johny ShockSamantha M. Ladona Ridgelaylor, MS, NCC, LPCA Counselor
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
## 2016-03-21 NOTE — ED Notes (Signed)
Hourly rounding reveals sleeping patient in room. No complaints, stable, in no acute distress. Q15 minute rounds and monitoring via Security Cameras to continue.  

## 2016-03-22 NOTE — ED Notes (Signed)
Hourly rounding reveals patient sleeping in room. No complaints, stable, in no acute distress. Q15 minute rounds and monitoring via Security Cameras to continue. 

## 2016-03-22 NOTE — BH Assessment (Addendum)
B[facility] Assessment Progress Note This Clinical research associatewriter spoke with patient this date to discuss treatment progress and evaluate current mental status. Patient has a strong body odor and does not seem to process the content of this writer's questions. This Clinical research associatewriter asked patient if he would go back to his room to discuss patient's case with patient not moving from the hall. Patient did not seem to understand the request. Patient would not make eye contact with this writer but did state he was having active AH. Patient stated he continues to hear voices telling him to "do something." Patient spoke in a low voice and would not elaborate on what the voices were saying in reference to what "that something" was. Case was staffed with Akintayo MD who recommended continued inpatient. Per notes, [facility] has [facility]/16.

## 2016-03-22 NOTE — BHH Counselor (Signed)
[facility] called to report the pt has been declined acceptance due to acuity.  Princess BruinsAquicha Duff, MSW, Theresia MajorsLCSWA

## 2016-03-22 NOTE — ED Notes (Signed)
Pt awake, alert & responsive no distress noted.  Monitoring for safety, Q 15 min checks in effect.  P[facility] tomorrow, 03/23/16.

## 2016-03-22 NOTE — ED Notes (Signed)
Pt will not be transported until 03/23/16 in am. To [facility]. [facility] [employer].

## 2016-03-22 NOTE — ED Notes (Signed)
Introduced self to patient. Pt oriented to unit expectations.  Assessed pt for:  A) Anxiety &/or agitation: Pt is irritable, but cooperative. He appears to have disorganized thoughts, and he is attention seeking and restless. He walks in the hallway most of the time, and has to be redirected from going into empty rooms.   S) Safety: Safety maintained with q-15-minute checks and hourly rounds by staff.  A) ADLs: Pt able to perform ADLs independently.  P) Pick-Up (room cleanliness): Pt's room clean and free of clutter.

## 2016-03-22 NOTE — Progress Notes (Signed)
03/22/16 1410:  LRT introduced self to pt and offered activities.  Pt stated he wanted to play checkers.  Pt played checkers with another peer.  Pt was focused, quiet and engaged.  Pt interacted well with peer.  Caroll RancherMarjette Akiem Urieta, LRT/CTRS

## 2016-03-22 NOTE — BH Assessment (Signed)
B[facility] Assessment Progress Note  Per Thedore MinsMojeed Akintayo, MD, this pt requires psychiatric hospitalization at this time.  Pt presents under IVC initiated by his mother, which Dr [name] has upheld.  At 09:45, staff [facility] calls to report that pt has been accepted to their facility by Dr [name].  Dr [name] concurs with this decision.  Pt's nurse, Diane, has been notified, and agrees to call report to [phone].  [place] [facility] [place] [facility] [facility]  [name] Canninghomas Aleksey Newbern, MA Triage Specialist [phone]

## 2016-03-23 NOTE — ED Notes (Signed)
[employer] at bedside to transport [facility] to [facility].

## 2016-03-23 NOTE — ED Notes (Signed)
Report called to RN Darcella GasmanLeslie Southerland,  [facility].  Pending [employer] this am.

## 2016-05-15 ENCOUNTER — Emergency Department (HOSPITAL_COMMUNITY)
Admission: EM | Admit: 2016-05-15 | Discharge: 2016-05-17 | Disposition: A | Discharge status: Home / Self Care | Payer: Medicaid Other | Attending: Emergency Medicine | Admitting: Emergency Medicine

## 2016-05-15 ENCOUNTER — Encounter (HOSPITAL_COMMUNITY): Payer: Self-pay | Admitting: Emergency Medicine

## 2016-05-15 DIAGNOSIS — Z79899 Other long term (current) drug therapy: Secondary | ICD-10-CM | POA: Insufficient documentation

## 2016-05-15 DIAGNOSIS — Z91010 Allergy to peanuts: Secondary | ICD-10-CM | POA: Insufficient documentation

## 2016-05-15 DIAGNOSIS — Z87891 Personal history of nicotine dependence: Secondary | ICD-10-CM | POA: Insufficient documentation

## 2016-05-15 DIAGNOSIS — R4689 Other symptoms and signs involving appearance and behavior: Secondary | ICD-10-CM

## 2016-05-15 DIAGNOSIS — Z818 Family history of other mental and behavioral disorders: Secondary | ICD-10-CM | POA: Diagnosis not present

## 2016-05-15 DIAGNOSIS — F209 Schizophrenia, unspecified: Secondary | ICD-10-CM

## 2016-05-15 DIAGNOSIS — F200 Paranoid schizophrenia: Secondary | ICD-10-CM | POA: Diagnosis not present

## 2016-05-15 DIAGNOSIS — R4183 Borderline intellectual functioning: Secondary | ICD-10-CM | POA: Diagnosis not present

## 2016-05-15 DIAGNOSIS — Z91040 Latex allergy status: Secondary | ICD-10-CM | POA: Insufficient documentation

## 2016-05-15 DIAGNOSIS — F1290 Cannabis use, unspecified, uncomplicated: Secondary | ICD-10-CM | POA: Diagnosis not present

## 2016-05-15 DIAGNOSIS — R4589 Other symptoms and signs involving emotional state: Secondary | ICD-10-CM | POA: Diagnosis not present

## 2016-05-15 DIAGNOSIS — R45851 Suicidal ideations: Secondary | ICD-10-CM | POA: Diagnosis present

## 2016-05-15 LAB — RAPID URINE DRUG SCREEN, HOSP PERFORMED
Amphetamines: NOT DETECTED
Barbiturates: NOT DETECTED
Benzodiazepines: NOT DETECTED
Cocaine: NOT DETECTED
Opiates: NOT DETECTED
Tetrahydrocannabinol: NOT DETECTED

## 2016-05-15 LAB — COMPREHENSIVE METABOLIC PANEL
ALBUMIN: 4.4 g/dL (ref 3.5–5.0)
ALK PHOS: 56 U/L (ref 38–126)
ALT: 22 U/L (ref 17–63)
ANION GAP: 7 (ref 5–15)
AST: 23 U/L (ref 15–41)
BILIRUBIN TOTAL: 0.8 mg/dL (ref 0.3–1.2)
BUN: 10 mg/dL (ref 6–20)
CALCIUM: 9.5 mg/dL (ref 8.9–10.3)
CO2: 26 mmol/L (ref 22–32)
Chloride: 105 mmol/L (ref 101–111)
Creatinine, Ser: 1.01 mg/dL (ref 0.61–1.24)
GLUCOSE: 90 mg/dL (ref 65–99)
Potassium: 3.8 mmol/L (ref 3.5–5.1)
Sodium: 138 mmol/L (ref 135–145)
TOTAL PROTEIN: 7.6 g/dL (ref 6.5–8.1)

## 2016-05-15 LAB — ETHANOL

## 2016-05-15 LAB — ACETAMINOPHEN LEVEL

## 2016-05-15 LAB — SALICYLATE LEVEL: Salicylate Lvl: <7 mg/dL (ref 2.8–30.0)

## 2016-05-15 LAB — CBC
HEMATOCRIT: 41.5 % (ref 39.0–52.0)
Hemoglobin: 14.1 g/dL (ref 13.0–17.0)
MCH: 29.9 pg (ref 26.0–34.0)
MCHC: 34 g/dL (ref 30.0–36.0)
MCV: 88.1 fL (ref 78.0–100.0)
Platelets: 225 10*3/uL (ref 150–400)
RBC: 4.71 MIL/uL (ref 4.22–5.81)
RDW: 12.9 % (ref 11.5–15.5)
WBC: 5.7 10*3/uL (ref 4.0–10.5)

## 2016-05-15 LAB — VALPROIC ACID LEVEL

## 2016-05-15 MED ORDER — DIVALPROEX SODIUM ER 500 MG PO TB24
750.0000 mg | ORAL_TABLET | Freq: Every day | ORAL | Status: DC
  Administered 2016-05-15 – 2016-05-16 (×2): 750 mg via ORAL
  Filled 2016-05-15 (×2): qty 1

## 2016-05-15 MED ORDER — ARIPIPRAZOLE 10 MG PO TABS
10.0000 mg | ORAL_TABLET | Freq: Every day | ORAL | Status: DC
  Administered 2016-05-15 – 2016-05-17 (×3): 10 mg via ORAL
  Filled 2016-05-15 (×3): qty 1

## 2016-05-15 MED ORDER — BENZTROPINE MESYLATE 0.5 MG PO TABS
0.5000 mg | ORAL_TABLET | Freq: Every day | ORAL | Status: DC
  Administered 2016-05-15 – 2016-05-16 (×2): 0.5 mg via ORAL
  Filled 2016-05-15 (×2): qty 1

## 2016-05-15 MED ORDER — IBUPROFEN 200 MG PO TABS
600.0000 mg | ORAL_TABLET | Freq: Three times a day (TID) | ORAL | Status: DC | PRN
  Administered 2016-05-16: 600 mg via ORAL
  Filled 2016-05-15: qty 3

## 2016-05-15 NOTE — ED Notes (Signed)
Hourly rounding reveals patient sleeping in room. No complaints, stable, in no acute distress. Q15 minute rounds and monitoring via Security Cameras to continue. 

## 2016-05-15 NOTE — Progress Notes (Signed)
Per psychiatrist request, CSW contacted patient's ACT TEAM ([facility] [phone]) to verify patient's medications. CSW spoke with staff member named Fannie KneeSue that reported patient has not been receiving services for the past 3 months. Staff provided CSW with following numbers noting that patient recently moved into family care home and that these numbers were listed for patient: [phone]; [phone] and [phone]. Staff reported that the facility would have patient's medication information.   CSW contacted [phone] [facility] staff reported that patient did not reside there and advised CSW to contact ([phone] [facility] CSW contacted [phone] [facility] staff reported that patient did not reside at facility. CSW contacted [phone]) no answer, CSW did not leave voice mail.   CSW spoke with patient at bedside. CSW asked patient did he have an ACT TEAM, patient shook his head no. CSW asked patient did he recently move, patient shook his head yes. CSW asked patient did he know where he moved, patient shook his head no. CSW thanked patient for information.   CSW contacted patient's mother [name] Love([name] Love [phone]) no answer. CSW will continue to try and reach patient's mother to verify patient's residence and medication.
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
## 2016-05-15 NOTE — ED Notes (Signed)
Pt. Transferred to [facility] from ED to room 34 after screening for contraband. Report to include Situation, Background, Assessment and Recommendations from [facility]  [facility]. Pt. Oriented to unit including Q15 minute rounds as well as the security cameras for their protection. Patient is alert and oriented, warm and dry in no acute distress. Patient refuses to answer questions concerning SI, HI, and AVH. Pt. Encouraged to let me know if needs arise.

## 2016-05-15 NOTE — Progress Notes (Signed)
CSW filed patient's examination paperwork into IVC logbook.    Rayven Hendrickson, LCSWA [facility]  Clinical Social Worker [phone] 

## 2016-05-15 NOTE — BH Assessment (Signed)
Patient referred to the following facilities for placement:  [facility] - [phone] [facility] - [phone] [facility] - [phone] [facility] - [phone] [facility] - [phone]  Davina PokeJoVea Mena Simonis, LCSW Therapeutic Triage Specialist  [facility] 05/15/2016 12:23 PM

## 2016-05-15 NOTE — ED Notes (Signed)
Hourly rounding reveals patient in room. No complaints, stable, in no acute distress. Q15 minute rounds and monitoring via Security Cameras to continue. 
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
## 2016-05-15 NOTE — ED Notes (Signed)
Bed: WTR5 Expected date:  Expected time:  Means of arrival:  Comments: 
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
## 2016-05-15 NOTE — ED Notes (Signed)
Report to include situation, background, assessment and recommendations from Edie Marvin RN. Patient sleeping, respirations regular and unlabored. Q15 minute rounds and security camera observation to continue.   

## 2016-05-15 NOTE — ED Notes (Signed)
Pt is mostly withdrawn today.  He was compliant with his medication.  He denies S/I, H/I, and AVH.  15 minute checks and video monitoring continue.
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
## 2016-05-16 ENCOUNTER — Encounter (HOSPITAL_COMMUNITY): Payer: Self-pay | Admitting: Registered Nurse

## 2016-05-16 DIAGNOSIS — R4689 Other symptoms and signs involving appearance and behavior: Secondary | ICD-10-CM | POA: Insufficient documentation

## 2016-05-16 DIAGNOSIS — R4183 Borderline intellectual functioning: Secondary | ICD-10-CM

## 2016-05-16 DIAGNOSIS — R4589 Other symptoms and signs involving emotional state: Secondary | ICD-10-CM

## 2016-05-16 DIAGNOSIS — Z87891 Personal history of nicotine dependence: Secondary | ICD-10-CM | POA: Diagnosis not present

## 2016-05-16 DIAGNOSIS — Z818 Family history of other mental and behavioral disorders: Secondary | ICD-10-CM

## 2016-05-16 DIAGNOSIS — F200 Paranoid schizophrenia: Secondary | ICD-10-CM

## 2016-05-16 MED ORDER — NICOTINE 21 MG/24HR TD PT24
21.0000 mg | MEDICATED_PATCH | Freq: Once | TRANSDERMAL | Status: DC
  Administered 2016-05-16: 21 mg via TRANSDERMAL
  Filled 2016-05-16: qty 1

## 2016-05-16 NOTE — ED Notes (Signed)
Hourly rounding reveals patient sleeping in room. No complaints, stable, in no acute distress. Q15 minute rounds and monitoring via Security Cameras to continue. 
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
## 2016-05-16 NOTE — ED Notes (Signed)
Hourly rounding reveals patient in sleeping room. No complaints, stable, in no acute distress. Q15 minute rounds and monitoring via Security Cameras to continue. 

## 2016-05-16 NOTE — ED Notes (Signed)
Hourly rounding reveals patient in room. No complaints, stable, in no acute distress. Q15 minute rounds and monitoring via Security Cameras to continue. 

## 2016-05-16 NOTE — ED Notes (Signed)
Report to include Situation, Background, Assessment, and Recommendations received from [name] RN. Patient alert and oriented, warm and dry, in no acute distress. Patient denies SI, HI, AVH and pain. Patient made aware of Q15 minute rounds and security cameras for their safety. Patient instructed to come to me with needs or concerns.  

## 2016-05-17 DIAGNOSIS — F1290 Cannabis use, unspecified, uncomplicated: Secondary | ICD-10-CM

## 2016-05-17 DIAGNOSIS — Z818 Family history of other mental and behavioral disorders: Secondary | ICD-10-CM | POA: Diagnosis not present

## 2016-05-17 DIAGNOSIS — Z79899 Other long term (current) drug therapy: Secondary | ICD-10-CM | POA: Diagnosis not present

## 2016-05-17 DIAGNOSIS — Z87891 Personal history of nicotine dependence: Secondary | ICD-10-CM | POA: Diagnosis not present

## 2016-05-17 DIAGNOSIS — F200 Paranoid schizophrenia: Secondary | ICD-10-CM | POA: Diagnosis not present

## 2016-05-17 MED ORDER — HALOPERIDOL 5 MG PO TABS: 5.0000 mg | ORAL_TABLET | Freq: Two times a day (BID) | ORAL | 0 refills | Status: DC

## 2016-05-17 MED ORDER — DIVALPROEX SODIUM ER 500 MG PO TB24: 1000.0000 mg | ORAL_TABLET | Freq: Every day | ORAL | 0 refills | Status: DC

## 2016-05-17 MED ORDER — TRAZODONE HCL 100 MG PO TABS: 100.0000 mg | ORAL_TABLET | Freq: Every evening | ORAL | 0 refills | Status: DC | PRN

## 2016-05-17 MED ORDER — BENZTROPINE MESYLATE 0.5 MG PO TABS: 0.5000 mg | ORAL_TABLET | Freq: Every day | ORAL | 0 refills | Status: DC

## 2016-05-17 NOTE — Discharge Instructions (Signed)
For your ongoing mental health needs, you are advised to follow up with [facility].  If you do not currently have an appoinment, new and returning patients are seen at their walk-in clinic.  Walk-in hours are Monday - Friday from 8:00 am - 3:00 pm.  Walk-in patients are seen on a first come, first served basis.  Try to arrive as early as possible for he [place] chance of being seen the same day:       [facility]      [address]      [place], [place] [address]      [phone]

## 2016-05-17 NOTE — ED Notes (Signed)
Hourly rounding reveals patient sleeping in room. No complaints, stable, in no acute distress. Q15 minute rounds and monitoring via Security Cameras to continue. 

## 2016-05-17 NOTE — [facility] Suicide Risk Assessment (Signed)
Suicide Risk Assessment  Discharge Assessment   [facility]   Principal Problem: Schizophrenia, paranoid type [facility](HCC) Discharge Diagnoses:  Patient Active Problem List   Diagnosis Date Noted  . Schizophrenia, paranoid type (HCC) [F20.0] 08/17/2015    Priority: High  . Aggression [R45.89]   . 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 Cannabis use disorder, severe, dependence (HCC) [F12.20] 11/26/2014    Total Time spent with patient: 30 minutes Musculoskeletal: Strength & Muscle Tone: within normal limits Gait & Station: normal Patient leans: N/A  Psychiatric Specialty Exam: Physical Exam  Constitutional: He is oriented to person, place, and time. He appears well-developed and well-nourished.  HENT:  Head: Normocephalic.  Neck: Normal range of motion.  Respiratory: Effort normal.  Musculoskeletal: Normal range of motion.  Neurological: He is alert and oriented to person, place, and time.  Psychiatric: He has a normal mood and affect. His speech is normal and behavior is normal. Judgment and thought content normal. Cognition and memory are normal.    ROS  Blood pressure 137/68, pulse 69, temperature 98.6 F (37 C), temperature source Oral, resp. rate 18, SpO2 99 %.There is no height or weight on file to calculate BMI.  General Appearance: Casual  Eye Contact:  Good  Speech:  Normal Rate  Volume:  Normal  Mood:  Euthymic  Affect:  Congruent  Thought Process:  Coherent and Descriptions of Associations: Intact  Orientation:  Full (Time, Place, and Person)  Thought Content:  WDL and Logical  Suicidal Thoughts:  No  Homicidal Thoughts:  No  Memory:  Immediate;   Good Recent;   Good Remote;   Good  Judgement:  Fair  Insight:  Fair  Psychomotor Activity:  Normal  Concentration:  Concentration: Good and Attention Span: Good  Recall:  Good  Fund of Knowledge:  Fair  Language:  Good  Akathisia:   No  Handed:  Right  AIMS (if indicated):     Assets:  Housing Leisure Time Physical Health Resilience Social Support  ADL's:  Intact  Cognition:  Impaired,  Mild  Sleep:       Mental Status Per Nursing Assessment::   On Admission:   hallucinations  Demographic Factors:  Male and Adolescent or young adult  Loss Factors: NA  Historical Factors: NA  Risk Reduction Factors:   Sense of responsibility to family, Living with another person, especially a relative and Positive social support  Continued Clinical Symptoms:  None  Cognitive Features That Contribute To Risk:  None    Suicide Risk:  Minimal: No identifiable suicidal ideation.  Patients presenting with no risk factors but with morbid ruminations; may be classified as minimal risk based on the severity of the depressive symptoms    Plan Of Care/Follow-up recommendations:  Activity:  as tolerated Diet:  heart healhty diet  Sritha Chauncey, NP 05/17/2016, 11:57 AM
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
## 2016-05-17 NOTE — ED Notes (Signed)
Pt discharged ambulatory with RX and discharge instructions.  All belongings were returned to pt.

## 2016-09-01 ENCOUNTER — Emergency Department (HOSPITAL_COMMUNITY)
Admission: EM | Admit: 2016-09-01 | Discharge: 2016-09-01 | Disposition: A | Discharge status: Home / Self Care | Payer: Medicaid Other | Attending: Emergency Medicine | Admitting: Emergency Medicine

## 2016-09-01 ENCOUNTER — Encounter (HOSPITAL_COMMUNITY): Payer: Self-pay

## 2016-09-01 ENCOUNTER — Emergency Department (HOSPITAL_COMMUNITY): Payer: Medicaid Other

## 2016-09-01 DIAGNOSIS — F209 Schizophrenia, unspecified: Secondary | ICD-10-CM | POA: Diagnosis not present

## 2016-09-01 DIAGNOSIS — Z91040 Latex allergy status: Secondary | ICD-10-CM | POA: Insufficient documentation

## 2016-09-01 DIAGNOSIS — Y999 Unspecified external cause status: Secondary | ICD-10-CM | POA: Diagnosis not present

## 2016-09-01 DIAGNOSIS — Z79899 Other long term (current) drug therapy: Secondary | ICD-10-CM | POA: Diagnosis not present

## 2016-09-01 DIAGNOSIS — W228XXA Striking against or struck by other objects, initial encounter: Secondary | ICD-10-CM | POA: Diagnosis not present

## 2016-09-01 DIAGNOSIS — Y939 Activity, unspecified: Secondary | ICD-10-CM | POA: Insufficient documentation

## 2016-09-01 DIAGNOSIS — Z91010 Allergy to peanuts: Secondary | ICD-10-CM | POA: Diagnosis not present

## 2016-09-01 DIAGNOSIS — Z87891 Personal history of nicotine dependence: Secondary | ICD-10-CM | POA: Insufficient documentation

## 2016-09-01 DIAGNOSIS — S52035A Nondisplaced fracture of olecranon process with intraarticular extension of left ulna, initial encounter for closed fracture: Secondary | ICD-10-CM | POA: Insufficient documentation

## 2016-09-01 DIAGNOSIS — S52022A Displaced fracture of olecranon process without intraarticular extension of left ulna, initial encounter for closed fracture: Secondary | ICD-10-CM

## 2016-09-01 DIAGNOSIS — Y929 Unspecified place or not applicable: Secondary | ICD-10-CM | POA: Insufficient documentation

## 2016-09-01 DIAGNOSIS — S59902A Unspecified injury of left elbow, initial encounter: Secondary | ICD-10-CM | POA: Diagnosis present

## 2016-09-01 NOTE — ED Triage Notes (Signed)
He gives a somewhat vague history to wit: his left elbow was injured today while engaging in horseplay with his brother. He has significant non-discolored edema of post. Left elbow with an abrasion at the center of the edema. He is ambulatory and in no distress.

## 2016-09-01 NOTE — ED Notes (Addendum)
Pt was not in room for ortho to splint his arm. Pt was seen walking out of ED with ice on elbow. Call attempted to pt's cell phone, no answer. EDPA notified.
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
## 2016-11-10 ENCOUNTER — Encounter (HOSPITAL_COMMUNITY): Payer: Self-pay | Admitting: Emergency Medicine

## 2016-11-10 ENCOUNTER — Emergency Department (HOSPITAL_COMMUNITY)
Admission: EM | Admit: 2016-11-10 | Discharge: 2016-11-10 | Disposition: A | Discharge status: Home / Self Care | Payer: Medicaid Other | Attending: Emergency Medicine | Admitting: Emergency Medicine

## 2016-11-10 DIAGNOSIS — Z79899 Other long term (current) drug therapy: Secondary | ICD-10-CM | POA: Diagnosis not present

## 2016-11-10 DIAGNOSIS — F203 Undifferentiated schizophrenia: Secondary | ICD-10-CM | POA: Diagnosis not present

## 2016-11-10 DIAGNOSIS — F17210 Nicotine dependence, cigarettes, uncomplicated: Secondary | ICD-10-CM | POA: Diagnosis not present

## 2016-11-10 DIAGNOSIS — Z72811 Adult antisocial behavior: Secondary | ICD-10-CM | POA: Diagnosis present
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
## 2016-11-10 NOTE — ED Notes (Signed)
ED Provider at bedside. 
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
## 2017-02-27 ENCOUNTER — Emergency Department (HOSPITAL_COMMUNITY)
Admission: EM | Admit: 2017-02-27 | Discharge: 2017-02-27 | Disposition: A | Discharge status: Home / Self Care | Payer: Medicaid Other | Attending: Emergency Medicine | Admitting: Emergency Medicine

## 2017-02-27 ENCOUNTER — Emergency Department (HOSPITAL_COMMUNITY): Payer: Medicaid Other

## 2017-02-27 ENCOUNTER — Encounter (HOSPITAL_COMMUNITY): Payer: Self-pay | Admitting: Emergency Medicine

## 2017-02-27 DIAGNOSIS — S0181XA Laceration without foreign body of other part of head, initial encounter: Secondary | ICD-10-CM

## 2017-02-27 DIAGNOSIS — Z79899 Other long term (current) drug therapy: Secondary | ICD-10-CM | POA: Insufficient documentation

## 2017-02-27 DIAGNOSIS — F17200 Nicotine dependence, unspecified, uncomplicated: Secondary | ICD-10-CM | POA: Insufficient documentation

## 2017-02-27 DIAGNOSIS — H1132 Conjunctival hemorrhage, left eye: Secondary | ICD-10-CM | POA: Diagnosis not present

## 2017-02-27 DIAGNOSIS — Y999 Unspecified external cause status: Secondary | ICD-10-CM | POA: Diagnosis not present

## 2017-02-27 DIAGNOSIS — Z91040 Latex allergy status: Secondary | ICD-10-CM | POA: Insufficient documentation

## 2017-02-27 DIAGNOSIS — Z23 Encounter for immunization: Secondary | ICD-10-CM | POA: Insufficient documentation

## 2017-02-27 DIAGNOSIS — S0512XA Contusion of eyeball and orbital tissues, left eye, initial encounter: Secondary | ICD-10-CM

## 2017-02-27 DIAGNOSIS — S0101XA Laceration without foreign body of scalp, initial encounter: Secondary | ICD-10-CM | POA: Insufficient documentation

## 2017-02-27 DIAGNOSIS — Y929 Unspecified place or not applicable: Secondary | ICD-10-CM | POA: Insufficient documentation

## 2017-02-27 DIAGNOSIS — Y939 Activity, unspecified: Secondary | ICD-10-CM | POA: Diagnosis not present

## 2017-02-27 DIAGNOSIS — S0990XA Unspecified injury of head, initial encounter: Secondary | ICD-10-CM | POA: Diagnosis present

## 2017-02-27 MED ORDER — LIDOCAINE-EPINEPHRINE (PF) 2 %-1:200000 IJ SOLN
10.0000 mL | Freq: Once | INTRAMUSCULAR | Status: AC
  Administered 2017-02-27: 10 mL
  Filled 2017-02-27: qty 20

## 2017-02-27 MED ORDER — TETANUS-DIPHTH-ACELL PERTUSSIS 5-2.5-18.5 LF-MCG/0.5 IM SUSP
0.5000 mL | Freq: Once | INTRAMUSCULAR | Status: AC
Start: 2017-02-27 — End: 2017-02-27
  Administered 2017-02-27: 0.5 mL via INTRAMUSCULAR
  Filled 2017-02-27: qty 0.5
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
## 2017-02-27 NOTE — ED Provider Notes (Signed)
LACERATION REPAIR Performed by: Georgiana ShoreJessica B Saliah Crisp Authorized by: Georgiana ShoreJessica B Jesiah Yerby Consent: Verbal consent obtained. Risks and benefits: risks, benefits and alternatives were discussed Consent given by: patient Patient identity confirmed: provided demographic data Prepped and Draped in normal sterile fashion Wound explored  Laceration Location: left parietal scalp  Laceration Length: 1 cm  No Foreign Bodies seen or palpated  Irrigation method: syringe Amount of cleaning: standard  Skin closure: staple  Number of sutures: 1  Technique: staple  Patient tolerance: Patient tolerated the procedure well with no immediate complications.   Georgiana ShoreMitchell, Haivyn Oravec B, PA-C 02/27/17 1901    Margarita Grizzleay, Danielle, MD 02/27/17 [phone]
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
## 2017-02-27 NOTE — ED Notes (Signed)
Patient transported to CT 

## 2017-02-27 NOTE — Discharge Instructions (Signed)
Have staple

## 2017-02-28 ENCOUNTER — Other Ambulatory Visit: Payer: Self-pay

## 2017-02-28 ENCOUNTER — Emergency Department (HOSPITAL_COMMUNITY)
Admission: EM | Admit: 2017-02-28 | Discharge: 2017-02-28 | Disposition: A | Discharge status: Home / Self Care | Payer: Medicaid Other | Attending: Emergency Medicine | Admitting: Emergency Medicine

## 2017-02-28 DIAGNOSIS — Z029 Encounter for administrative examinations, unspecified: Secondary | ICD-10-CM | POA: Insufficient documentation

## 2017-02-28 DIAGNOSIS — Z5321 Procedure and treatment not carried out due to patient leaving prior to being seen by health care provider: Secondary | ICD-10-CM | POA: Diagnosis not present

## 2017-02-28 NOTE — ED Triage Notes (Signed)
SW consult done with a call to Pt Mother

## 2017-02-28 NOTE — ED Triage Notes (Signed)
Pt Mother here to to pick up Pt for home. CSW  Requested this Clinical research associatewriter to come to Pt room with Mother and self . CSW , Mother and this Clinical research associatewriter in Pt room. Pt reported he has a good support system at home. Pt voiced interest in Out Pt. Counseling. CSW provided list of Out-pt centers to Pt at DC.

## 2017-02-28 NOTE — ED Notes (Signed)
Declined W/C at D/C and was escorted to lobby by RN. 

## 2017-02-28 NOTE — Progress Notes (Signed)
CSW spoke with pt's mother Rodney Boozeasha at 8:30 am and confirmed that she would be picking pt up today at 9:00am or 9:15am. CSW was informed by mother that she was suppose to pick pt up last night but didn't feel safe having him at that time. CSW has updated RN of pt's mother picking pt up at this time. There are no further CSW needs. CSW signing off.      Claude MangesKierra S. Trejuan Matherne, MSW, LCSW-A Emergency Department Clinical Social Worker [phone]

## 2017-02-28 NOTE — Progress Notes (Signed)
CSW, RN, pt and pt's mother all spoke at bedside. CSW provided Psychoeducational as well as solution focused therapy for pt so that when pt feels that pt is becoming upset, can find an alternative to lashing out. Pt reported to CSW that pt feels that pt has support from mother and other family members. Pt reports that pt feels safe at home with mother and that pt does enjoy being at home with mom. Pt was provided outpatient mental health resources so that pt can utilize them if pt chooses to. At this time pt has been discharge to mother with no further concerns presented. CSW signing off.     Claude MangesKierra S. Danesha Kirchoff, MSW, LCSW-A Emergency Department Clinical Social Worker [phone]

## 2017-02-28 NOTE — ED Triage Notes (Addendum)
PT Mother has not arrived to pick up PT .

## 2017-02-28 NOTE — ED Triage Notes (Signed)
Per SW Mother is to come at 9;30 to pick pt up. Mother wants to speak with SW on arrival.

## 2017-02-28 NOTE — ED Notes (Signed)
Regular Diet Ordered for Breakfast.

## 2017-03-21 ENCOUNTER — Other Ambulatory Visit: Payer: Self-pay

## 2017-03-21 ENCOUNTER — Emergency Department (HOSPITAL_COMMUNITY)
Admission: EM | Admit: 2017-03-21 | Discharge: 2017-03-22 | Disposition: A | Discharge status: Home / Self Care | Payer: Medicaid Other | Attending: Emergency Medicine | Admitting: Emergency Medicine

## 2017-03-21 DIAGNOSIS — F200 Paranoid schizophrenia: Secondary | ICD-10-CM | POA: Insufficient documentation

## 2017-03-21 DIAGNOSIS — R451 Restlessness and agitation: Secondary | ICD-10-CM | POA: Insufficient documentation

## 2017-03-21 LAB — COMPREHENSIVE METABOLIC PANEL
ALT: 14 U/L — AB (ref 17–63)
AST: 23 U/L (ref 15–41)
Albumin: 5.1 g/dL — ABNORMAL HIGH (ref 3.5–5.0)
Alkaline Phosphatase: 74 U/L (ref 38–126)
Anion gap: 7 (ref 5–15)
BILIRUBIN TOTAL: 0.8 mg/dL (ref 0.3–1.2)
BUN: 10 mg/dL (ref 6–20)
CHLORIDE: 106 mmol/L (ref 101–111)
CO2: 25 mmol/L (ref 22–32)
CREATININE: 0.92 mg/dL (ref 0.61–1.24)
Calcium: 9.6 mg/dL (ref 8.9–10.3)
GFR calc Af Amer: >60 mL/min (ref >60)
Glucose, Bld: 97 mg/dL (ref 65–99)
POTASSIUM: 3.8 mmol/L (ref 3.5–5.1)
Sodium: 138 mmol/L (ref 135–145)
Total Protein: 8.4 g/dL — ABNORMAL HIGH (ref 6.5–8.1)

## 2017-03-21 LAB — CBC
HCT: 41 % (ref 39.0–52.0)
Hemoglobin: 13.9 g/dL (ref 13.0–17.0)
MCH: 29.9 pg (ref 26.0–34.0)
MCHC: 33.9 g/dL (ref 30.0–36.0)
MCV: 88.2 fL (ref 78.0–100.0)
PLATELETS: 225 10*3/uL (ref 150–400)
RBC: 4.65 MIL/uL (ref 4.22–5.81)
RDW: 13.1 % (ref 11.5–15.5)
WBC: 4.3 10*3/uL (ref 4.0–10.5)

## 2017-03-21 LAB — SALICYLATE LEVEL: Salicylate Lvl: <7 mg/dL (ref 2.8–30.0)

## 2017-03-21 LAB — ACETAMINOPHEN LEVEL: Acetaminophen (Tylenol), Serum: <10 ug/mL — ABNORMAL LOW (ref 10–30)

## 2017-03-21 LAB — ETHANOL

## 2017-03-21 MED ORDER — ZIPRASIDONE MESYLATE 20 MG IM SOLR
20.0000 mg | Freq: Once | INTRAMUSCULAR | Status: AC
  Administered 2017-03-21: 20 mg via INTRAMUSCULAR
  Filled 2017-03-21: qty 20

## 2017-03-21 MED ORDER — LORAZEPAM 2 MG/ML IJ SOLN: 2.0000 mg | INTRAMUSCULAR | Status: DC | PRN

## 2017-03-21 MED ORDER — LORAZEPAM 2 MG/ML IJ SOLN
2.0000 mg | Freq: Once | INTRAMUSCULAR | Status: AC
  Administered 2017-03-21: 2 mg via INTRAMUSCULAR
  Filled 2017-03-21: qty 1

## 2017-03-21 MED ORDER — STERILE WATER FOR INJECTION IJ SOLN
INTRAMUSCULAR | Status: AC
  Administered 2017-03-21: 1.2 mL
  Filled 2017-03-21: qty 10

## 2017-03-21 NOTE — ED Notes (Signed)
Unable to collect labs at this time. 
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
## 2017-03-21 NOTE — ED Notes (Signed)
Bed: [facility]PT3 Expected date:  Expected time:  Means of arrival:  Comments: 

## 2017-03-21 NOTE — ED Notes (Signed)
Bed: [facility]PT1 Expected date:  Expected time:  Means of arrival:  Comments: 

## 2017-03-21 NOTE — ED Notes (Signed)
Pt changed into paper scrubs at this time and lab work being done at this time.
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
## 2017-03-21 NOTE — ED Triage Notes (Signed)
Paatient will not answer any questions wile in triage. [employer] at the bedside x 3. Patient remains handcuffed.

## 2017-03-21 NOTE — ED Triage Notes (Addendum)
Per [employer] Patient ws at psych clinic. Patient has been off of his medications and has had violent behavior at home. Dr. At clinic [facility] work out. Patient is in handcuffs in the consult room.

## 2017-03-21 NOTE — ED Notes (Signed)
Pt presents under IVC, agitated in triage, Geodon and Ativan given.  Pt sedated at present, unable to assess.  Pt sleepy and not interactive with staff.  Awake, alert & responsive, no distress noted, Calm at present.  Monitoring for safety, Q 15 min checks in effect.

## 2017-03-21 NOTE — BH Assessment (Signed)
[facility] Assessment Progress Note   Pt unable to be assessed at this time.  Has had geodon and ativan.  Nursing staff to inform when pt is alert and oriented.

## 2017-03-22 ENCOUNTER — Inpatient Hospital Stay (HOSPITAL_COMMUNITY)
Admission: AD | Admit: 2017-03-22 | Discharge: 2017-04-01 | DRG: 885 | Disposition: A | Discharge status: Home / Self Care | Payer: Medicaid Other | Attending: Psychiatry | Admitting: Psychiatry

## 2017-03-22 ENCOUNTER — Encounter (HOSPITAL_COMMUNITY): Payer: Self-pay

## 2017-03-22 DIAGNOSIS — F17210 Nicotine dependence, cigarettes, uncomplicated: Secondary | ICD-10-CM | POA: Diagnosis not present

## 2017-03-22 DIAGNOSIS — R440 Auditory hallucinations: Secondary | ICD-10-CM | POA: Diagnosis not present

## 2017-03-22 DIAGNOSIS — F200 Paranoid schizophrenia: Principal | ICD-10-CM | POA: Diagnosis present

## 2017-03-22 DIAGNOSIS — F419 Anxiety disorder, unspecified: Secondary | ICD-10-CM | POA: Diagnosis present

## 2017-03-22 DIAGNOSIS — G4700 Insomnia, unspecified: Secondary | ICD-10-CM | POA: Diagnosis present

## 2017-03-22 DIAGNOSIS — R451 Restlessness and agitation: Secondary | ICD-10-CM | POA: Diagnosis not present

## 2017-03-22 DIAGNOSIS — F17200 Nicotine dependence, unspecified, uncomplicated: Secondary | ICD-10-CM | POA: Diagnosis present

## 2017-03-22 DIAGNOSIS — F39 Unspecified mood [affective] disorder: Secondary | ICD-10-CM | POA: Diagnosis not present

## 2017-03-22 DIAGNOSIS — Z818 Family history of other mental and behavioral disorders: Secondary | ICD-10-CM | POA: Diagnosis not present

## 2017-03-22 LAB — RAPID URINE DRUG SCREEN, HOSP PERFORMED
Amphetamines: NOT DETECTED
Barbiturates: NOT DETECTED
Benzodiazepines: POSITIVE — AB
Cocaine: NOT DETECTED
Opiates: NOT DETECTED
Tetrahydrocannabinol: NOT DETECTED

## 2017-03-22 MED ORDER — LORAZEPAM 2 MG/ML IJ SOLN
2.0000 mg | INTRAMUSCULAR | Status: DC | PRN
  Administered 2017-03-22: 2 mg via INTRAMUSCULAR
  Filled 2017-03-22: qty 1

## 2017-03-22 MED ORDER — LORAZEPAM 1 MG PO TABS
1.0000 mg | ORAL_TABLET | Freq: Four times a day (QID) | ORAL | Status: DC | PRN
Start: 2017-03-22 — End: 2017-04-01
  Administered 2017-03-23 – 2017-03-31 (×9): 1 mg via ORAL
  Filled 2017-03-22 (×9): qty 1

## 2017-03-22 MED ORDER — ZIPRASIDONE HCL 20 MG PO CAPS
20.0000 mg | ORAL_CAPSULE | Freq: Two times a day (BID) | ORAL | Status: DC
  Administered 2017-03-23: 20 mg via ORAL
  Filled 2017-03-22 (×7): qty 1

## 2017-03-22 MED ORDER — HYDROXYZINE HCL 25 MG PO TABS
25.0000 mg | ORAL_TABLET | Freq: Three times a day (TID) | ORAL | Status: DC | PRN
  Administered 2017-03-23 – 2017-03-31 (×7): 25 mg via ORAL
  Filled 2017-03-22 (×7): qty 1

## 2017-03-22 MED ORDER — ACETAMINOPHEN 325 MG PO TABS
650.0000 mg | ORAL_TABLET | Freq: Four times a day (QID) | ORAL | Status: DC | PRN
  Administered 2017-03-31: 650 mg via ORAL
  Filled 2017-03-22: qty 2

## 2017-03-22 MED ORDER — LORAZEPAM 1 MG PO TABS
1.0000 mg | ORAL_TABLET | Freq: Once | ORAL | Status: AC
  Administered 2017-03-22: 1 mg via ORAL
  Filled 2017-03-22: qty 1

## 2017-03-22 MED ORDER — ZIPRASIDONE MESYLATE 20 MG IM SOLR
10.0000 mg | Freq: Two times a day (BID) | INTRAMUSCULAR | Status: DC | PRN
  Administered 2017-03-22 – 2017-03-23 (×2): 10 mg via INTRAMUSCULAR
  Filled 2017-03-22 (×2): qty 20

## 2017-03-22 MED ORDER — TRAZODONE HCL 100 MG PO TABS
100.0000 mg | ORAL_TABLET | Freq: Every evening | ORAL | Status: DC | PRN
  Administered 2017-03-23 – 2017-03-28 (×3): 100 mg via ORAL
  Filled 2017-03-22 (×5): qty 1

## 2017-03-22 MED ORDER — ALUM & MAG HYDROXIDE-SIMETH 200-200-20 MG/5ML PO SUSP: 30.0000 mL | ORAL | Status: DC | PRN

## 2017-03-22 MED ORDER — DIPHENHYDRAMINE HCL 50 MG/ML IJ SOLN: 25.0000 mg | Freq: Four times a day (QID) | INTRAMUSCULAR | Status: DC | PRN

## 2017-03-22 MED ORDER — MAGNESIUM HYDROXIDE 400 MG/5ML PO SUSP: 30.0000 mL | Freq: Every day | ORAL | Status: DC | PRN

## 2017-03-22 MED ORDER — DIPHENHYDRAMINE HCL 25 MG PO CAPS: 25.0000 mg | ORAL_CAPSULE | Freq: Four times a day (QID) | ORAL | Status: DC | PRN

## 2017-03-22 NOTE — Progress Notes (Signed)
D: Pt sleep in room majority of the evening    A: Pt was offered support and encouragement.   Q 15 minute checks were done for safety.  R: safety maintained on unit.

## 2017-03-22 NOTE — ED Notes (Signed)
Called police for transport.

## 2017-03-22 NOTE — ED Notes (Signed)
Patient started becoming agitated, pacing and taking his shirt off.  Nurse practitioner notified.  Ativan ordered.

## 2017-03-22 NOTE — BH Assessment (Signed)
Assessment Note  [name] Love is a 22 y.o. male in [facility] due [facility]. IVC indicates that pt "presented as a new patient for primary care and became hostile and aggressive with staff." Pt is an extremely poor historian and appears to lack any and all insight into his bx. Pt denies any aggressive or hostile bx at any time or towards anyone. Pt reports taking his meds, but denies knowing who prescribes them or what they are. Pt denies SI, HI, AVH. Pt is well known to the ED and is usually IVC'd by his mother for similar behaviors and not being compliant with his meds. Pt will be restarted on his meds and either able to be d/c from the ED or obtain crisis stabilization treatment at an IP psychiatric facility.    Diagnosis: Schizophrenia  Past Medical History:  Past Medical History:  Diagnosis Date  . Schizophrenia, acute (HCC)     No past surgical history on file.  Family History:  Family History  Problem Relation Age of Onset  . Mental illness Cousin     Social History:  reports that he has been smoking.  He has been smoking about 0.00 packs per day for the past 4.00 years. he has never used smokeless tobacco. He reports that he drinks alcohol. He reports that he does not use drugs.  Additional Social History:  Alcohol / Drug Use Pain Medications: see PTA list Prescriptions: see PTA list Over the Counter: see PTA list History of alcohol / drug use?: No history of alcohol / drug abuse  CIWA: CIWA-Ar BP: (!) 130/56 Pulse Rate: 72 COWS:    Allergies:  Allergies  Allergen Reactions  . Chocolate Rash    unknown  . Latex Rash    Condoms only  . Peanuts [Peanut Oil] Rash    All Nuts  . Tape Rash    Home Medications:  (Not in a hospital admission)  OB/GYN Status:  No LMP for male patient.  General Assessment Data Assessment unable to be completed: Yes Reason for not completing assessment: Pt given ativan and geodon.   Resting comfortably. [facility] of [facility] Assessment: In system Is this a Tele or Face-to-Face Assessment?: Face-to-Face Is this an Initial Assessment or a Re-assessment for this encounter?: Initial Assessment Marital status: Single Living Arrangements: Parent, Other relatives Admission Status: Involuntary Is patient capable of signing voluntary admission?: No Referral Source: Self/Family/Friend Insurance type: Medicaid     Crisis Care Plan Living Arrangements: Parent, Other relatives Name of Psychiatrist: pt doesn't know Name of Therapist: none  Education Status Is patient currently in school?: No  Risk to self with the past 6 months Suicidal Ideation: No Has patient been a risk to self within the past 6 months prior to admission? : No Suicidal Intent: No Has patient had any suicidal intent within the past 6 months prior to admission? : No Is patient at risk for suicide?: No Suicidal Plan?: No Has patient had any suicidal plan within the past 6 months prior to admission? : No Access to Means: No Previous Attempts/Gestures: No Intentional Self Injurious Behavior: None Family Suicide History: Unknown Persecutory voices/beliefs?: No Depression: No Substance abuse history and/or treatment for substance abuse?: No Suicide prevention information given to non-admitted patients: Not applicable  Risk to Others within the past 6 months Homicidal Ideation: No Does patient have any lifetime risk of violence toward others beyond the six months prior to admission? :  Yes (comment) Thoughts of Harm to Others: No Current Homicidal Intent: No Current Homicidal Plan: No Access to Homicidal Means: No History of harm to others?: Yes Assessment of Violence: On admission Violent Behavior Description: see IVC paperwork Does patient have access to weapons?: No Criminal Charges Pending?: No Does patient have a court date: No Is patient on probation?:  Unknown  Psychosis Hallucinations: None noted Delusions: None noted  Mental Status Report Appearance/Hygiene: Unremarkable Eye Contact: Good Motor Activity: Unremarkable, Restlessness Speech: Unremarkable Level of Consciousness: Alert Mood: Apathetic, Pleasant Affect: Appropriate to circumstance Anxiety Level: Minimal Thought Processes: Unable to Assess Judgement: Impaired Orientation: Person Obsessive Compulsive Thoughts/Behaviors: None  Cognitive Functioning Concentration: Normal Memory: Recent Impaired, Remote Impaired IQ: Average Insight: Poor Impulse Control: Poor Sleep: Unable to Assess Vegetative Symptoms: None  ADLScreening [facility] Services) Patient's cognitive ability adequate to safely complete daily activities?: Yes Patient able to express need for assistance with ADLs?: Yes Independently performs ADLs?: Yes (appropriate for developmental age)  Prior Inpatient Therapy Prior Inpatient Therapy: Yes Prior Therapy Dates: multiple admissions Reason for Treatment: schizophrenia  Prior Outpatient Therapy Prior Outpatient Therapy: Yes Prior Therapy Dates: unknown Prior Therapy Facilty/Provider(s): [facility] Reason for Treatment: schizophrenia Does patient have an ACCT team?: Unknown Does patient have Intensive In-House Services?  : No Does patient have [facility] services? : Unknown Does patient have P4CC services?: No  ADL Screening (condition at time of admission) Patient's cognitive ability adequate to safely complete daily activities?: Yes Is the patient deaf or have difficulty hearing?: No Does the patient have difficulty seeing, even when wearing glasses/contacts?: No Does the patient have difficulty concentrating, remembering, or making decisions?: Yes Patient able to express need for assistance with ADLs?: Yes Does the patient have difficulty dressing or bathing?: No Independently performs ADLs?: Yes (appropriate for developmental age) Does the  patient have difficulty walking or climbing stairs?: No Weakness of Legs: None Weakness of Arms/Hands: None  Home Assistive Devices/Equipment Home Assistive Devices/Equipment: None    Abuse/Neglect Assessment (Assessment to be complete while patient is alone) Abuse/Neglect Assessment Can Be Completed: Unable to assess, patient is non-responsive or altered mental status     Advance Directives (For Healthcare) Does Patient Have a Medical Advance Directive?: No Would patient like information on creating a medical advance directive?: No - Patient declined    Additional Information 1:1 In Past 12 Months?: No CIRT Risk: Yes Elopement Risk: No Does patient have medical clearance?: Yes     Disposition:  Disposition Initial Assessment Completed for this Encounter: Yes(consulted with Dr. Sharma Covert and Elta Guadeloupe, NP) Disposition of Patient: Inpatient treatment program Type of inpatient treatment program: Adult  On Site Evaluation by:   Reviewed with Physician:    Laddie Aquas 03/22/2017 10:40 AM

## 2017-03-22 NOTE — ED Notes (Signed)
[facility]nie WalkerRN  [facility]  Nurse instructed to hold patient until after 1pm.

## 2017-03-22 NOTE — BH Assessment (Signed)
[facility] Assessment Progress Note  [facility] Can come anytime. Accepting provider is Dr. Sharma CovertNorman. Pt is IVC and will be transported by [facility]. Call report to [phone]. Pt's RN, Aram Beechamynthia, notified.   [name] ShockSamantha M. Ladona Ridgelaylor, MS, NCC, LPCA Counselor

## 2017-03-22 NOTE — Tx Team (Signed)
Initial Treatment Plan 03/22/2017 4:03 PM [name] [id number]    PATIENT STRESSORS: Marital or family conflict Medication change or noncompliance   PATIENT STRENGTHS: General fund of knowledge Physical Health   PATIENT IDENTIFIED PROBLEMS: Psychosis  "Eat"                   DISCHARGE CRITERIA:  Improved stabilization in mood, thinking, and/or behavior Verbal commitment to aftercare and medication compliance  PRELIMINARY DISCHARGE PLAN: Outpatient therapy Medication management  PATIENT/FAMILY INVOLVEMENT: This treatment plan has been presented to and reviewed with the patient, [name] Love.  The patient and family have been given the opportunity to ask questions and make suggestions.  Levin BaconHeather V Moataz Tavis, RN 03/22/2017, 4:03 PM

## 2017-03-22 NOTE — [facility] Group Notes (Signed)
Pt did not attend group. 

## 2017-03-22 NOTE — ED Notes (Signed)
Patient resting on bed.  Much calmer now.
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
## 2017-03-23 DIAGNOSIS — R440 Auditory hallucinations: Secondary | ICD-10-CM

## 2017-03-23 DIAGNOSIS — Z818 Family history of other mental and behavioral disorders: Secondary | ICD-10-CM

## 2017-03-23 DIAGNOSIS — F200 Paranoid schizophrenia: Principal | ICD-10-CM

## 2017-03-23 MED ORDER — DIVALPROEX SODIUM ER 500 MG PO TB24
750.0000 mg | ORAL_TABLET | Freq: Every day | ORAL | Status: DC
  Administered 2017-03-23 – 2017-03-31 (×6): 750 mg via ORAL
  Filled 2017-03-23 (×12): qty 1

## 2017-03-23 MED ORDER — ARIPIPRAZOLE 10 MG PO TABS
10.0000 mg | ORAL_TABLET | Freq: Every day | ORAL | Status: DC
  Administered 2017-03-23 – 2017-03-26 (×4): 10 mg via ORAL
  Filled 2017-03-23 (×8): qty 1

## 2017-03-23 NOTE — Progress Notes (Signed)
Adult Psychoeducational Group Note  Date:  03/23/2017 Time:  9:23 PM  Group Topic/Focus:  Wrap-Up Group:   The focus of this group is to help patients review their daily goal of treatment and discuss progress on daily workbooks.  Participation Level:  Minimal  Participation Quality:  Appropriate  Affect:  Flat  Cognitive:  Oriented  Insight: Limited  Engagement in Group:  Engaged  Modes of Intervention:  Socialization and Support  Additional Comments:  Patient attended and participated in group tonight. Her reports that today he slept all day. He just woke up and had his dinner  Lita [name], Derrick Orris [facility] 03/23/2017, 9:23 PM

## 2017-03-23 NOTE — Progress Notes (Signed)
Patient denies SI, HI and AVH.  Patient has multiple somatic complaints that were address by physician this shift.  Patient in no behavioral dyscontrol   Assess patient for safety, offer medications as prescribed, engage patient in 1:1 staff talks.   Patient able to contract for safety.

## 2017-03-23 NOTE — Progress Notes (Signed)
Recreation Therapy Notes  Date: 03/23/17 Time: 1000 Location: 500 Hall Dayroom  Group Topic: Anger Management  Goal Area(s) Addresses:  Patient will identify triggers for anger.  Patient will identify physical reaction to anger.   Patient will identify benefit of using coping skills when angry.  Intervention: Worksheet  Activity: Intro to Anger Management.  LRT gave pt a worksheet on anger.  Patients were to identify at least 3 situations that lead to feelings of anger, ways they act differently when angry and problems caused by anger.  Education: Anger Management, Discharge Planning   Education Outcome: Acknowledges education/In group clarification offered/Needs additional education.   Clinical Observations/Feedback: Pt did not attend group.     Caroll RancherMarjette Tryton Bodi, LRT/CTRS     [name], Riese Hellard A 03/23/2017 12:37 PM
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
## 2017-03-23 NOTE — Progress Notes (Signed)
Pt woke up this morning , pt appeared a little disorganized and confused at times , but was appropriate .
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
## 2017-03-23 NOTE — Progress Notes (Signed)
Recreation Therapy Notes  INPATIENT RECREATION THERAPY ASSESSMENT  Patient Details Name: [name] MRN: [id number] DOB: 1995-12-20 Today's Date: 03/23/2017  Patient Stressors: (Pt didn't identify any stressors.)  Pt stated he didn't know why he was here.  Coping Skills:   Isolate, Avoidance, Self-Injury, Exercise, Art/Dance, Talking, Music, Sports  Personal Challenges: (Pt didn't identify any personal challenges.)  Leisure Interests (2+):  (Pt identified no community resources.)  Awareness of Community Resources:  Yes  Community Resources:  Restaurants, Other (Comment)(Motorcycle shop)  Current Use: Yes  Patient Strengths:  Push Ups; Squats  Patient Identified Areas of Improvement:  Curls; [employer]  Current Recreation Participation:  Every week   Patient Goal for Hospitalization:  "Leave"  [place]  County of Residence:  [place]  Current [place] (including self-harm):  Yes(Rated a 0 out of 10; contracts for safety)  Current HI:  Yes(Rated a 6 out of 10; contracts for safety)  Consent to Intern Participation: N/A    Caroll RancherMarjette Jamar Weatherall, LRT/CTRS  [name], Jarae Nemmers A 03/23/2017, 12:57 PM

## 2017-03-23 NOTE — Tx Team (Signed)
Interdisciplinary Treatment and Diagnostic Plan Update  03/23/2017 Time of Session: 10:46 AM  [name] Love MRN: [id number]  Principal Diagnosis: <principal problem not specified>  Secondary Diagnoses: Active Problems:   Schizophrenia, paranoid type ([place])   Current Medications:  Current Facility-Administered Medications  Medication Dose Route Frequency Provider Last Rate Last Dose  . acetaminophen (TYLENOL) tablet 650 mg  650 mg Oral Q6H PRN Ethelene Hal, NP      . alum & mag hydroxide-simeth (MAALOX/MYLANTA) 200-200-20 MG/5ML suspension 30 mL  30 mL Oral Q4H PRN Ethelene Hal, NP      . diphenhydrAMINE (BENADRYL) capsule 25 mg  25 mg Oral Q6H PRN Ethelene Hal, NP       Or  . diphenhydrAMINE (BENADRYL) injection 25 mg  25 mg Intramuscular Q6H PRN Ethelene Hal, NP      . hydrOXYzine (ATARAX/VISTARIL) tablet 25 mg  25 mg Oral TID PRN Ethelene Hal, NP   25 mg at 03/23/17 0910  . LORazepam (ATIVAN) injection 2 mg  2 mg Intramuscular Q4H PRN Ethelene Hal, NP   2 mg at 03/22/17 1522  . LORazepam (ATIVAN) tablet 1 mg  1 mg Oral Q6H PRN Ethelene Hal, NP   1 mg at 03/23/17 0910  . magnesium hydroxide (MILK OF MAGNESIA) suspension 30 mL  30 mL Oral Daily PRN Ethelene Hal, NP      . traZODone (DESYREL) tablet 100 mg  100 mg Oral QHS PRN Ethelene Hal, NP      . ziprasidone (GEODON) capsule 20 mg  20 mg Oral BID WC Ethelene Hal, NP   20 mg at 03/23/17 0733  . ziprasidone (GEODON) injection 10 mg  10 mg Intramuscular BID PRN Ethelene Hal, NP   10 mg at 03/23/17 2952    PTA Medications: Medications Prior to Admission  Medication Sig Dispense Refill Last Dose  . ARIPiprazole ER 400 MG SUSR Inject 400 mg into the muscle every 28 (twenty-eight) days. (Due to be given on 11-14-15): For mood control (Patient not taking: Reported on 05/15/2016) 1 each 0 Not Taking at Unknown time  . benztropine (COGENTIN) 0.5 MG  tablet Take 1 tablet (0.5 mg total) by mouth at bedtime. For prevention of drug induced tremors (Patient not taking: Reported on 02/27/2017) 30 tablet 0 Not Taking at Unknown time  . divalproex (DEPAKOTE ER) 500 MG 24 hr tablet Take 2 tablets (1,000 mg total) by mouth at bedtime. (Patient not taking: Reported on 02/27/2017) 60 tablet 0 Not Taking at Unknown time  . haloperidol (HALDOL) 5 MG tablet Take 1 tablet (5 mg total) by mouth 2 (two) times daily. (Patient not taking: Reported on 02/27/2017) 60 tablet 0 Not Taking at Unknown time  . traZODone (DESYREL) 100 MG tablet Take 1 tablet (100 mg total) by mouth at bedtime as needed for sleep. (Patient not taking: Reported on 02/27/2017) 30 tablet 0 Not Taking at Unknown time    Patient Stressors: Marital or family conflict Medication change or noncompliance  Patient Strengths: General fund of knowledge Physical Health  Treatment Modalities: Medication Management, Group therapy, Case management,  1 to 1 session with clinician, Psychoeducation, Recreational therapy.   Physician Treatment Plan for Primary Diagnosis: <principal problem not specified> Long Term Goal(s): Improvement in symptoms so as ready for discharge  Short Term Goals:    Medication Management: Evaluate patient's response, side effects, and tolerance of medication regimen.  Therapeutic Interventions: 1 to 1 sessions, Unit Group sessions  and Medication administration.  Evaluation of Outcomes: Progressing  Physician Treatment Plan for Secondary Diagnosis: Active Problems:   Schizophrenia, paranoid type ([place])   Long Term Goal(s): Improvement in symptoms so as ready for discharge  Short Term Goals:    Medication Management: Evaluate patient's response, side effects, and tolerance of medication regimen.  Therapeutic Interventions: 1 to 1 sessions, Unit Group sessions and Medication administration.  Evaluation of Outcomes: Progressing   RN Treatment Plan for Primary  Diagnosis: <principal problem not specified> Long Term Goal(s): Knowledge of disease and therapeutic regimen to maintain health will improve  Short Term Goals: Ability to identify and develop effective coping behaviors will improve and Compliance with prescribed medications will improve  Medication Management: RN will administer medications as ordered by provider, will assess and evaluate patient's response and provide education to patient for prescribed medication. RN will report any adverse and/or side effects to prescribing provider.  Therapeutic Interventions: 1 on 1 counseling sessions, Psychoeducation, Medication administration, Evaluate responses to treatment, Monitor vital signs and CBGs as ordered, Perform/monitor CIWA, COWS, AIMS and Fall Risk screenings as ordered, Perform wound care treatments as ordered.  Evaluation of Outcomes: Progressing   LCSW Treatment Plan for Primary Diagnosis: <principal problem not specified> Long Term Goal(s): Safe transition to appropriate next level of care at discharge, Engage patient in therapeutic group addressing interpersonal concerns.  Short Term Goals: Engage patient in aftercare planning with referrals and resources  Therapeutic Interventions: Assess for all discharge needs, 1 to 1 time with Social worker, Explore available resources and support systems, Assess for adequacy in community support network, Educate family and significant other(s) on suicide prevention, Complete Psychosocial Assessment, Interpersonal group therapy.  Evaluation of Outcomes: Met  Return home, follow up [place] in Treatment: Attending groups: Yes Participating in groups: Yes Taking medication as prescribed: Yes Toleration medication: Yes, no side effects reported at this time Family/Significant other contact made: No Patient understands diagnosis: No Limited insight Discussing patient identified problems/goals with staff: Yes Medical problems  stabilized or resolved: Yes Denies suicidal/homicidal ideation: Yes Issues/concerns per patient self-inventory: None Other: N/A  New problem(s) identified: None identified at this time.   New Short Term/Long Term Goal(s): "I don't need to be here"  Discharge Plan or Barriers:   Reason for Continuation of Hospitalization: Paranoia Disorganization Medication stabilization   Estimated Length of Stay: 1/14  Attendees: Patient: [name] Love 03/23/2017  10:46 AM  Physician: Maris Berger, MD 03/23/2017  10:46 AM  Nursing: Sena Hitch, RN 03/23/2017  10:46 AM  RN Care Manager: Lars Pinks, RN 03/23/2017  10:46 AM  Social Worker: Ripley Fraise 03/23/2017  10:46 AM  Recreational Therapist: Winfield Cunas 03/23/2017  10:46 AM  Other: Norberto Sorenson 03/23/2017  10:46 AM  Other:  03/23/2017  10:46 AM    Scribe for Treatment Team:  Roque Lias LCSW 03/23/2017 10:46 AM

## 2017-03-23 NOTE — BHH Counselor (Deleted)
Adult Comprehensive Assessment  Patient ID: [name] Love, male   DOB: 03/24/1995, 22 y.o.   MRN: [id number] Information Source: Information source: Patient  Current Stressors:  Educational / Learning stressors: High school graduate Employment / Job issues: unemployed Family Relationships: lives w mother  Surveyor, quantity / Lack of resources (include bankruptcy): no income Housing / Lack of housing: family Physical health (include injuries & life threatening diseases): no concerns Social relationships: has "one friend I communicate w through my mind" Substance abuse: "I smoke weed daily" Bereavement / Loss: concerned about grandmother in wheelchair  Living/Environment/Situation:  Living Arrangements: Parent Living conditions (as described by patient or guardian): lives w mother, brother, sister and mother's good friend How long has patient lived in current situation?: few months - says family moved from [place] area recently What is atmosphere in current home: Supportive  Family History:  Marital status: Single Are you sexually active?: No What is your sexual orientation?: heterosexual Has your sexual activity been affected by drugs, alcohol, medication, or emotional stress?: unknown Does patient have children?: No  Childhood History:  By whom was/is the patient raised?: Mother Additional childhood history information: Does not know who his father is. Description of patient's relationship with caregiver when they were a child: Pt has good relationship w mother, father unknown Patient's description of current relationship with people who raised him/her: good w mother, has always lived w her How were you disciplined when you got in trouble as a child/adolescent?: unable to assess Does patient have siblings?: Yes Number of Siblings: 2 Description of patient's current relationship with siblings: Says he has two siblings - brother and sister Did patient suffer any  verbal/emotional/physical/sexual abuse as a child?: No Did patient suffer from severe childhood neglect?: No Has patient ever been sexually abused/assaulted/raped as an adolescent or adult?: No Witnessed domestic violence?: No Has patient been effected by domestic violence as an adult?: No  Education:  Highest grade of school patient has completed: 12th grade graduate at [employer] Currently a student?: No Learning disability?: No  Employment/Work Situation:   Employment situation: Unemployed Patient's job has been impacted by current illness: No What is the longest time patient has a 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 employed at that time?: Unknown Has patient ever been in the [employer]?: No Has patient ever served in combat?: No Did You Receive Any Psychiatric Treatment/Services While in Equities trader?: No Are There Guns or Other Weapons in Your Home?: No (Pt states "there are only hats" )  Architect:   Surveyor, quantity resources: [employer], Support from parents / caregiver Does patient have a Lawyer or guardian?: No  Alcohol/Substance Abuse:   What has been your use of drugs/alcohol within the last 12 months?: pt states he smokes "weed" daily If attempted suicide, did drugs/alcohol play a role in this?: No Alcohol/Substance Abuse Treatment Hx: Denies past history Has alcohol/substance abuse ever caused legal problems?: No  Social Support System:   Conservation officer, nature Support System: Fair Museum/gallery exhibitions officer System: likes to be alone, feels support from family Type of faith/religion: unknown How does patient's faith help to cope with current illness?: na  Leisure/Recreation:   Leisure and Hobbies: "I like to cook and eat spaghetti"  Strengths/Needs:   What things does the patient do well?: cooks In what areas does patient struggle / problems for patient: isolation, lack of activities to keep him occupied during day,  worried about grandmother in wheelchair  Discharge Plan:  Does patient have access to transportation?: Yes Will patient be returning to same living situation after discharge?: Yes Currently receiving community mental health services: Yes [facility])   If no, would patient like referral for services when discharged?: No Does patient have financial barriers related to discharge medications?: No   Summary/Recommendations:   Summary and Recommendations (to be completed by the evaluator): Sayvion is a 22 year old, African American male who is diagnosed with Schizophrenia. Kaled presented to the hospital under IVC due to hostile and aggressive behavior while at his outpatient agency, [facility]. During the assessment, Darelle was pleasant, however a very poor historian and did not provide much information. When asked why he was brought to the hospital, Iziah stated " I dont know". Dawood also demonstrated delusional thinking during the process of the assessment by mentioning that he had "two wives and two girlfriends who he slaps for no reason". He also demonstrated tangential speech during the assessment process. Robertlee stated that he [facility] up with [facility], however per records [facility]. Kert was a poor historian and did not respond when asked if someone could contact his mother for additional information. Dan can benefit from crisis stabilization, medication management, therapeutic milieu and referral services.   Ida Rogueodney B Irvine Glorioso. 03/23/2017

## 2017-03-23 NOTE — BHH Counselor (Addendum)
Adult Comprehensive Assessment  Patient ID: [name] Love, male   DOB: 09-19-1995, 22 y.o.   MRN: [id number]  Information Source: Information source: Patient  Current Stressors:  Educational / Learning stressors: High school graduate Employment / Job issues: Disability Family Relationships: lives w mother  Surveyor, quantity / Lack of resources (include bankruptcy): fixed income Housing / Lack of housing: family Physical health (include injuries & life threatening diseases): no concerns Social relationships: has "one friend I communicate w through my mind" Substance abuse: "I smoke weed daily" Bereavement / Loss: concerned about grandmother in wheelchair  Living/Environment/Situation:  Living Arrangements: Parent Living conditions (as described by patient or guardian): lives w mother, brother, sister and mother's good friend How long has patient lived in current situation?: few months - says family moved from [place] area recently What is atmosphere in current home: Supportive  Family History:  Marital status: Single Are you sexually active?: No What is your sexual orientation?: heterosexual Has your sexual activity been affected by drugs, alcohol, medication, or emotional stress?: unknown Does patient have children?: No  Childhood History:  By whom was/is the patient raised?: Mother Additional childhood history information: Does not know who his father is. Description of patient's relationship with caregiver when they were a child: Pt has good relationship w mother, father unknown Patient's description of current relationship with people who raised him/her: good w mother, has always lived w her How were you disciplined when you got in trouble as a child/adolescent?: unable to assess Does patient have siblings?: Yes Number of Siblings: 2 Description of patient's current relationship with siblings: Says he has two siblings - brother and sister Did patient suffer any  verbal/emotional/physical/sexual abuse as a child?: No Did patient suffer from severe childhood neglect?: No Has patient ever been sexually abused/assaulted/raped as an adolescent or adult?: No Witnessed domestic violence?: No Has patient been effected by domestic violence as an adult?: No  Education:  Highest grade of school patient has completed: 12th grade graduate at [employer] Currently a student?: No Learning disability?: No  Employment/Work Situation:   Employment situation: Unemployed Patient's job has been impacted by current illness: No What is the longest time patient has a 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 employed at that time?: Unknown Has patient ever been in the [employer]?: No Has patient ever served in combat?: No Did You Receive Any Psychiatric Treatment/Services While in Equities trader?: No Are There Guns or Other Weapons in Your Home?: No (Pt states "there are only hats" )  Architect:   Surveyor, quantity resources: [employer], Support from parents / caregiver Does patient have a Lawyer or guardian?: No  Alcohol/Substance Abuse:   What has been your use of drugs/alcohol within the last 12 months?: pt states he smokes "weed" daily If attempted suicide, did drugs/alcohol play a role in this?: No Alcohol/Substance Abuse Treatment Hx: Denies past history Has alcohol/substance abuse ever caused legal problems?: No  Social Support System:   Conservation officer, nature Support System: Fair Museum/gallery exhibitions officer System: likes to be alone, feels support from family Type of faith/religion: unknown How does patient's faith help to cope with current illness?: na  Leisure/Recreation:   Leisure and Hobbies: "I like to cook and eat spaghetti"  Strengths/Needs:   What things does the patient do well?: cooks In what areas does patient struggle / problems for patient: isolation, lack of activities to keep him occupied during day,  worried about grandmother in wheelchair  Discharge Plan:  Does patient have access to transportation?: Yes Will patient be returning to same living situation after discharge?: Yes Currently receiving community mental health services: Yes (From Whom) [facility]) If no, would patient like referral for services when discharged?: No Does patient have financial barriers related to discharge medications?: No  Summary/Recommendations:   Summary and Recommendations (to be completed by the evaluator): Sherlock is a 22 year old, African American male who is diagnosed with Schizophrenia. Izen presented to the hospital under IVC due to hostile and aggressive behavior while at his outpatient agency, [facility]. During the assessment, Camara was pleasant, however a very poor historian and did not provide much information. When asked why he was brought to the hospital, Rayden stated " I dont know". Erdem also demonstrated delusional thinking during the process of the assessment by mentioning that he had "two wives and two girlfriends who he slaps for no reason". He also demonstrated tangential speech during the assessment process. Knight stated that he followed up with [facility], however per records [facility]. Ulmer was a poor historian and did not respond when asked if someone could contact his mother for additional information. Ricki can benefit from crisis stabilization, medication management, therapeutic milieu and referral services.   Ida Rogueodney B Carren Blakley. 03/23/2017

## 2017-03-23 NOTE — BHH Group Notes (Signed)
LCSW Group Therapy Note   03/23/2017 1:15pm   Type of Therapy and Topic:  Group Therapy:  Overcoming Obstacles   Participation Level:  Did Not Attend   Description of Group:    In this group patients will be encouraged to explore what they see as obstacles to their own wellness and recovery. They will be guided to discuss their thoughts, feelings, and behaviors related to these obstacles. The group will process together ways to cope with barriers, with attention given to specific choices patients can make. Each patient will be challenged to identify changes they are motivated to make in order to overcome their obstacles. This group will be process-oriented, with patients participating in exploration of their own experiences as well as giving and receiving support and challenge from other group members.   Therapeutic Goals: 1. Patient will identify personal and current obstacles as they relate to admission. 2. Patient will identify barriers that currently interfere with their wellness or overcoming obstacles.  3. Patient will identify feelings, thought process and behaviors related to these barriers. 4. Patient will identify two changes they are willing to make to overcome these obstacles:      Summary of Patient Progress      Therapeutic Modalities:   Cognitive Behavioral Therapy Solution Focused Therapy Motivational Interviewing Relapse Prevention Therapy  Ida RogueRodney B Bodey Frizell, LCSW 03/23/2017 3:41 PM

## 2017-03-23 NOTE — Progress Notes (Signed)
D: Pt denies SI/HI/AV. Pt is pleasant and cooperative. Pt presents disorganized at times, pt seems confused from time to time. Pt has been appropriate on the milieu, and has no had any behavior issues this evening.   A: Pt was offered support and encouragement. Pt was given scheduled medications. Pt was encourage to attend groups. Q 15 minute checks were done for safety.   R:Pt attends groups and interacts well with peers and staff. Pt is taking medication. Pt has no complaints.Pt receptive to treatment and safety maintained on unit.

## 2017-03-23 NOTE — Plan of Care (Signed)
  Safety: Periods of time without injury will increase 03/23/2017 2226 - Progressing by Delos HaringPhillips, Shariq Puig A, RN Note Pt safe on the unit at this time

## 2017-03-24 DIAGNOSIS — F39 Unspecified mood [affective] disorder: Secondary | ICD-10-CM

## 2017-03-24 NOTE — Progress Notes (Signed)
Patient has been in his room for the majority of the shift. Patient has been in no behavioral dyscontrol this shift. Patient.  Patient denies SI, HI and AVH.  Patient has been compliant with medications and attended groups.   Assess patient for safety, offer medications as prescribed, engage patient in 1:1 staff talks.   Patient able to contract for safety, continue to monitor as planned.

## 2017-03-24 NOTE — Progress Notes (Signed)
Recreation Therapy Notes  Date: 03/24/17 Time: 1000 Location: 500 Hall Dayroom   Group Topic: Communication, Team Building, Problem Solving  Goal Area(s) Addresses:  Patient will effectively work with peer towards shared goal.  Patient will identify skills used to make activity successful.  Patient will identify how skills used during activity can be used to reach post d/c goals.   Behavioral Response: Minimal  Intervention: STEM Activity  Activity: Landing Pad. In teams patients were given 12 plastic drinking straws and a length of masking tape. Using the materials provided patients were asked to build a landing pad to catch a golf ball dropped from approximately 6 feet in the air.   Education: Pharmacist, communityocial Skills, Discharge Planning   Education Outcome: Acknowledges education/In group clarification offered/Needs additional education.   Clinical Observations/Feedback: Pt attempted to come up with an idea.  Pt was social.  Pt would ask random questions about random topics but he was appropriate.    Caroll RancherMarjette Johara Lodwick, LRT/CTRS     [name], Boysie Bonebrake A 03/24/2017 12:28 PM

## 2017-03-24 NOTE — Progress Notes (Signed)
Pt up at the nursing station constantly, needing re-direction, pt wanting to make a phone call, wanting magazines. It was noticed on pt 15-min check sheet that pt slept almost 7.5 hrs during the day. Pt was encouraged to try to stay awake during the day due to there being no activities for the pt after 11 pm and all patients are encouraged to be in the bed / sleep during the night

## 2017-03-24 NOTE — Progress Notes (Signed)
Pt in bed awake.  Pt denies pain or discomfort.  Pt contracts for safety.  Pt refuses night meds and says in bed during group.

## 2017-03-24 NOTE — Progress Notes (Signed)
Patient ID: [name] Love, male   DOB: 05-09-1995, 22 y.o.   MRN: [id number]  Pt is up pacing in hallway. Pt appears anxious and irritable. Pt has been redirected multiple times to try to rest but comes up to nurses station staring at staff. Pt given medication for anxiety. Will continue to reassess.
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
## 2017-03-24 NOTE — [facility] Group Notes (Signed)
[facility]  03/24/2017 , 12:56 PM    Type of Therapy:  [employer] Presentation  Participation Level:  Active  Participation Quality:  Attentive  Affect:  Blunted  Cognitive:  Oriented  Insight:  Limited  Engagement in Therapy:  Engaged  Modes of Intervention:  Discussion, Education and Socialization  Summary of Progress/Problems:  [name] Love  from [employer] came to present her recovery story, encourage group  members to share something about their story, and present information about the MHA.  Came initially.  Using chair for pushups.  Left and did not return.  [name] Love, [name] Love 03/24/2017 , 12:56 PM

## 2017-03-24 NOTE — [facility] Group Notes (Signed)
Pt did not attend wrap up group this evening. Pt stayed in bed.

## 2017-03-25 DIAGNOSIS — F419 Anxiety disorder, unspecified: Secondary | ICD-10-CM

## 2017-03-25 DIAGNOSIS — G4700 Insomnia, unspecified: Secondary | ICD-10-CM

## 2017-03-25 DIAGNOSIS — F17210 Nicotine dependence, cigarettes, uncomplicated: Secondary | ICD-10-CM

## 2017-03-25 MED ORDER — ARIPIPRAZOLE ER 400 MG IM SRER
400.0000 mg | INTRAMUSCULAR | Status: DC
  Administered 2017-03-25: 400 mg via INTRAMUSCULAR

## 2017-03-25 NOTE — Progress Notes (Signed)
Writer spoke with patient 1:1 and he reports having had a good day. He reports going to play basketball today. Writer informed him of his scheduled medications and trazadone available for sleep. He c/o his right shoulder hurting from an injection he got. Writer offered tylenol and heat pack and he chose heat pack. He attended group briefly but left early. Writer asked if he heard voices and he reported that he hears sounds. He also reports that he does not like to be around alotof people. Support given and safety maintained on  Unit with 15 min checks.

## 2017-03-25 NOTE — [facility] Suicide Risk Assessment (Signed)
[facility] INPATIENT:  Family/Significant Other Suicide Prevention Education  Suicide Prevention Education:  Education Completed; No one has been identified by the patient as the family member/significant other with whom the patient will be residing, and identified as the person(s) who will aid the patient in the event of a mental health crisis (suicidal ideations/suicide attempt).  With written consent from the patient, the family member/significant other has been provided the following suicide prevention education, prior to the and/or following the discharge of the patient.  The suicide prevention education provided includes the following:  Suicide risk factors  Suicide prevention and interventions  National Suicide Hotline telephone number  [facility] assessment telephone number  [facility] 911  [facility] and/or [facility]idential Mobile Crisis [facility] telephone number  Request made of family/significant other to:  Remove weapons (e.g., guns, rifles, knives), all items previously/currently identified as safety concern.    Remove drugs/medications (over-the-counter, prescriptions, illicit drugs), all items previously/currently identified as a safety concern.  The family member/significant other verbalizes understanding of the suicide prevention education information provided.  The family member/significant other agrees to remove the items of safety concern listed above. The patient did not endorse SI at the time of admission, nor did the patient c/o SI during the stay here.  SPE not required. However, I did talk to mother, [name], [phone], who explained that she had actually set pt and brother up in the house together alone as she and the other children could not stay in same house due to pt's aggression and scary behavior. At this point, she is interested in seeing if he can get into placement.  He has been attending a PSR, but she is unhappy with it as  it has mostly older clients.  [facility], but [facility] to [facility] because she heard they may have more services for him.  Mother works 2nd shift, and so it is the evenings she is most concerned about when he is unsupervised.   Baldo DaubRodney B [facility] 03/25/2017, 10:21 AM
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
## 2017-03-25 NOTE — [facility] Group Notes (Signed)
[facility] LCSW Group Therapy  03/25/2017  1:05 PM  Type of Therapy:  Group therapy  Participation Level:  Active  Participation Quality:  Attentive  Affect:  Flat  Cognitive:  Oriented  Insight:  Limited  Engagement in Therapy:  Limited  Modes of Intervention:  Discussion, Socialization  Summary of Progress/Problems:  Chaplain was here to lead a group on themes of hope and courage.  "Hope is Tyrelle." Minimal interaction, but stayed the entire time.  Daryel Geraldorth, Ellis Koffler B 03/25/2017 1:34 PM

## 2017-03-25 NOTE — Progress Notes (Signed)
Recreation Therapy Notes  Date: 03/25/17 Time: 1000 Location: 500 Hall Dayroom  Group Topic: Wellness  Goal Area(s) Addresses:  Patient will define components of whole wellness. Patient will verbalize benefit of whole wellness.  Behavioral Response: Engaged  Intervention: 2 Decks of Cards, music  Activity: Deck of Chance.  Each patient was given two cards from one deck.  LRT would pull a card from the second deck.  If LRT pulled either of the patients numbers, patients would have to do the exercise that corresponds with that number.    Education: Wellness, Building control surveyorDischarge Planning.   Education Outcome: Acknowledges education/In group clarification offered/Needs additional education.   Clinical Observations/Feedback: Pt didn't go by the cards he was given, pt just did whatever some of his peers did.  Pt was appropriate and focused during group.    Caroll RancherMarjette Less [place], LRT/CTRS         [name], Natelie Ostrosky A 03/25/2017 12:38 PM

## 2017-03-25 NOTE — Progress Notes (Signed)
D:Pt is labile and cautious at times with his interaction. He was observed starring into space. Pt was standing at the end of the hall and Research officer, political partycalled writer. When writer asked how she could help, he asked when he would be leaving. Writer explained the process of being in the hospital and pt responded "I love you." Pt is currently attending recreation group. He took his scheduled morning medication without complaint.  Pt appears confused and checked all spaces on his self inventory.  A:Offered support, encouragement, redirection and 15 minute checks. R:Pt verbally denies si and hi. Safety maintained on the unit.

## 2017-03-26 DIAGNOSIS — R451 Restlessness and agitation: Secondary | ICD-10-CM

## 2017-03-26 MED ORDER — ARIPIPRAZOLE 15 MG PO TABS
15.0000 mg | ORAL_TABLET | Freq: Every day | ORAL | Status: DC
  Administered 2017-03-27 – 2017-04-01 (×6): 15 mg via ORAL
  Filled 2017-03-26 (×8): qty 1

## 2017-03-26 NOTE — [facility] Group Notes (Signed)
[facility] LCSW Group Therapy Note  Date/Time:    03/26/2017   11:30 AM - 12:00 PM  Type of Therapy and Topic:  Group Therapy:  Thought Distortions  Participation Level:  Active   Description of Group:  In this group, patients were asked to describe the last time they got upset and what their thinking about the situation was at that time.  They were then introduced to cognitive distortions and discussed how these can negatively affect their lives.  This included only a few cognitive distortion types, including  All or Nothing thinking, Focusing on the Negative, Blaming, Predicting the Future and Overgeneralization.  Therapeutic Goals: 1. Patient will be able to identify this exercise of examining their thoughts as a healthy coping mechanism. 2. Patient will identify a problem area in their life and the automatic thoughts they have about that situation. 3. Patient will verbalize the feelings and outcomes typically associated with their thoughts. 4. Patient will think about and discuss other more realistic conclusions/thoughts that could be more helpful.  Summary of Patient Progress:  The patient expressed that he is upset "every second of my life."  When asked what helps him to cope with that, he said "drinking water."  When pressed for more examples of specific ways that it is helpful to deal with being upset, he said "bowel movements and showers."  CSW contiually made efforts to redirect him and to reframe his statements into more appropriate, I.e. Talking about hygiene rather than the above.  He kept raising his hand then asking what the question was or why we were talking about this.  Suddenly he asked "Why you keep talking about ass?" and CSW stated this had not been done, but if he had misintepreted, CSW was sorry for any misunderstanding.  He then became upset and demanded to know why CSW was apologizing to him, then immediately demanded "Why you keep calling me bitch?"  CSW firmly said that this had  not been said in group, using his name, and he then said "Stop using my name."  CSW quickly ended group.   Therapeutic Modalities Cognitive Behavioral Therapy  Ambrose MantleMareida Grossman-Orr, LCSW 03/26/2017 12:00PM

## 2017-03-26 NOTE — Progress Notes (Signed)
Writer has observed patient up in the dayroom for brief periods tonight. He does not interact with other peers and keeps mostly to himself.  He was observed blurting out comments in the dayroom but it was not directed to anyone in the dayroom. Writer spoke with him about his medication scheduled and how he did not take it the previous night. He asked if he could take it after we spoke and it was given. He reports having had a good day today but did not elaborate. Safety maintained on unit with 15 min checks.

## 2017-03-26 NOTE — Progress Notes (Signed)
[facility] MD Progress Note  03/26/2017 1:15 PM Shariq Bonenfant  MRN:  [id number]   Subjective: [name] Love reports " I am still hearing voice and I am ready to go." Patient reports "everything is everything"   Objective: Pasco Keagy seen sitting in dayroom staring blankly. Patient reports hearing voices and reports he always hears voices. Patient presents guarded and paranoid and is not interactive or engaged  during this assessment. Reports taken medication and tolerating well. Per staffing notes patient seen responding to internal stimuli. Reports a fair appetite and states he is resting well. Abilify was increased to 15 mg PO and last Aripiprazole  injections was 03/23/2017. Denies suicidal or homicidal ideation (with head nodding,left to right.)  Support, encouragement and reassurance was provided.     Principal Problem: Schizophrenia, paranoid type (HCC) Diagnosis:   Patient Active Problem List   Diagnosis Date Noted  . Aggression [R46.89]   . 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 Cannabis use disorder, severe, dependence (HCC) [F12.20] 11/26/2014   Total Time spent with patient: 30 minutes  Past Psychiatric History: see H&P  Past Medical History:  Past Medical History:  Diagnosis Date  . Schizophrenia, acute (HCC)    History reviewed. No pertinent surgical history. Family History:  Family History  Problem Relation Age of Onset  . Mental illness Cousin    Family Psychiatric  History: see H&P Social History:  Social History   Substance and Sexual Activity  Alcohol Use Yes   Comment: a little      Social History   Substance and Sexual Activity  Drug Use No   Comment: denies    Social History   Socioeconomic History  . Marital status: Single    Spouse name: None  . Number of children: None  . Years of education: None  . Highest education level: None  Social  Needs  . Financial resource strain: None  . Food insecurity - worry: None  . Food insecurity - inability: None  . Transportation needs - medical: None  . Transportation needs - non-medical: None  Occupational History  . None  Tobacco Use  . Smoking status: Current Every Day Smoker    Packs/day: 0.00    Years: 4.00    Pack years: 0.00  . Smokeless tobacco: Never Used  Substance and Sexual Activity  . Alcohol use: Yes    Comment: a little   . Drug use: No    Comment: denies  . Sexual activity: Not Currently  Other Topics Concern  . None  Social History Narrative  . None   Additional Social History:             Sleep: Fair  Appetite:  Poor  Current Medications: Current Facility-Administered Medications  Medication Dose Route Frequency Provider Last Rate Last Dose  . acetaminophen (TYLENOL) tablet 650 mg  650 mg Oral Q6H PRN Laveda Abbe, NP      . alum & mag hydroxide-simeth (MAALOX/MYLANTA) 200-200-20 MG/5ML suspension 30 mL  30 mL Oral Q4H PRN Laveda Abbe, NP      . Melene Muller ON 03/27/2017] ARIPiprazole (ABILIFY) tablet 15 mg  15 mg Oral Daily Oneta Rack, NP      . ARIPiprazole ER SRER 400 mg  400 mg Intramuscular Q30 days Micheal Likens, MD   400 mg at 03/25/17 1220  . diphenhydrAMINE (BENADRYL) capsule 25 mg  25 mg Oral Q6H PRN  Laveda Abbe, NP       Or  . diphenhydrAMINE (BENADRYL) injection 25 mg  25 mg Intramuscular Q6H PRN Laveda Abbe, NP      . divalproex (DEPAKOTE ER) 24 hr tablet 750 mg  750 mg Oral QHS Jolyne Loa T, MD   750 mg at 03/23/17 2123  . hydrOXYzine (ATARAX/VISTARIL) tablet 25 mg  25 mg Oral TID PRN Laveda Abbe, NP   25 mg at 03/25/17 2029  . LORazepam (ATIVAN) injection 2 mg  2 mg Intramuscular Q4H PRN Laveda Abbe, NP   2 mg at 03/22/17 1522  . LORazepam (ATIVAN) tablet 1 mg  1 mg Oral Q6H PRN Laveda Abbe, NP   1 mg at 03/24/17 0059  . magnesium hydroxide  (MILK OF MAGNESIA) suspension 30 mL  30 mL Oral Daily PRN Laveda Abbe, NP      . traZODone (DESYREL) tablet 100 mg  100 mg Oral QHS PRN Laveda Abbe, NP   100 mg at 03/23/17 2123  . ziprasidone (GEODON) injection 10 mg  10 mg Intramuscular BID PRN Laveda Abbe, NP   10 mg at 03/23/17 1610    Lab Results: No results found for this or any previous visit (from the past 48 hour(s)).  Blood Alcohol level:  Lab Results  Component Value Date   ETH <10 03/21/2017   ETH <5 05/15/2016    Metabolic Disorder Labs: Lab Results  Component Value Date   HGBA1C 5.4 07/15/2015   MPG 117 11/27/2014   Lab Results  Component Value Date   PROLACTIN 57.5 (H) 08/20/2015   PROLACTIN 23.2 (H) 02/04/2015   Lab Results  Component Value Date   CHOL 225 (H) 07/15/2015   TRIG 74 07/15/2015   HDL 58 07/15/2015   CHOLHDL 3.9 07/15/2015   VLDL 15 07/15/2015   LDLCALC 152 (H) 07/15/2015   LDLCALC 164 (H) 11/27/2014    Physical Findings: AIMS: Facial and Oral Movements Muscles of Facial Expression: None, normal Lips and Perioral Area: None, normal Jaw: None, normal Tongue: None, normal,Extremity Movements Upper (arms, wrists, hands, fingers): None, normal Lower (legs, knees, ankles, toes): None, normal, Trunk Movements Neck, shoulders, hips: None, normal, Overall Severity Severity of abnormal movements (highest score from questions above): None, normal Incapacitation due to abnormal movements: None, normal Patient's awareness of abnormal movements (rate only patient's report): No Awareness, Dental Status Current problems with teeth and/or dentures?: No Does patient usually wear dentures?: No  CIWA:    COWS:     Musculoskeletal: Strength & Muscle Tone: within normal limits Gait & Station: normal Patient leans: N/A  Psychiatric Specialty Exam: Physical Exam  Nursing note and vitals reviewed. Constitutional: He appears well-developed.  Cardiovascular: Normal rate.   Psychiatric: He has a normal mood and affect.    Review of Systems  Cardiovascular: Negative for chest pain.  Psychiatric/Behavioral: Positive for hallucinations. Negative for depression. The patient has insomnia. The patient is not nervous/anxious.   All other systems reviewed and are negative.   Blood pressure 126/68, pulse 98, temperature 98.5 F (36.9 C), temperature source Oral, resp. rate 16, height 5\' 11"  (1.803 m), weight 74.8 kg (165 lb).Body mass index is 23.01 kg/m.  General Appearance: Bizarre and Casual patient is more engaged during this assessment today.   Eye Contact:  Good- intense staring at times  Speech:  Clear and Coherent and Normal Rate  Volume:  Normal  Mood:  Anxious and Irritable  Affect:  Blunt  and Flat  Thought Process:  Descriptions of Associations: Loose  Orientation:  Other:  UTA  Thought Content:  Logical  Suicidal Thoughts:  No  Homicidal Thoughts:  No  Memory:  Immediate;   Fair Recent;   Fair Remote;   Fair  Judgement:  Impaired  Insight:  Lacking  Psychomotor Activity:  Normal  Concentration:  Concentration: Poor  Recall:  Poor  Fund of Knowledge:  Poor  Language:  Fair  Akathisia:  No  Handed:    AIMS (if indicated):     Assets:  Manufacturing systems engineerCommunication Skills Physical Health Resilience Social Support  ADL's:  Intact  Cognition:  WNL  Sleep:  Number of Hours: 3.75     Treatment Plan Summary: Daily contact with patient to assess and evaluate symptoms and progress in treatment and Medication management.   - Continue with current treatment plan on 03/27/2017 except where noted   -Schizophrenia             -Increased  abilify 10 mg to 15mg   po qday             - Continue Depakote ER 750mg  qhs (depakote level on 03/28/17)   - Abilify SRER 400mg  IM q30 days ( first dose 03/25/17)  -Anxiety/agitation             - Continue atarax 25mg  po q8h prn anxiety             - Continue ativan/geodon PRN's for agitation  -Insomnia             -  Continue trazodone 100mg  po qhs prn insomnia  -Encourage participation in groups and therapeutic milieu  -Discharge planning will be ongoing   Oneta Rackanika N Rennie Rouch, NP 03/26/2017, 1:15 PM

## 2017-03-26 NOTE — Progress Notes (Signed)
Patient with labile mood and behavior, was very sullen, angry and refusing to talk with Clinical research associatewriter and other staff early in the shift. Later was silly and discussing cartoons that he enjoys watching. At times lips are moving as though he is responding but patient denies AVH. Present in the milieu with minimal interaction, rigid body posture.

## 2017-03-27 NOTE — Plan of Care (Signed)
Patient continues with labile mood and interaction; compliant with medications. Reports improved sleep, continues to report depression, anxiety and hopelessness. Denies psychotic symptoms yet appears to be responding with lips moving at times.

## 2017-03-27 NOTE — [facility] Group Notes (Signed)
[facility]  Date/Time:  03/27/2017  11:00AM-12:00PM  Type of Therapy and Topic:  Group Therapy:  Music and Mood  Participation Level:  Active   Description of Group: In this process group, members listened to a variety of genres of music and identified that different types of music evoke different responses.  Patients were encouraged to identify music that was soothing for them and music that was energizing for them.  Patients discussed how this knowledge can help with wellness and recovery in various ways including managing depression and anxiety as well as encouraging healthy sleep habits.    Therapeutic Goals: 1. Patients will explore the impact of different varieties of music on mood 2. Patients will verbalize the thoughts they have when listening to different types of music 3. Patients will identify music that is soothing to them as well as music that is energizing to them 4. Patients will discuss how to use this knowledge to assist in maintaining wellness and recovery 5. Patients will explore the use of music as a coping skill  Summary of Patient Progress:  At the beginning of group, patient expressed that he felt "angry" and at the end of group "cautious."  During various songs, he also said he felt good, calm, relaxed.  Therapeutic Modalities: Solution Focused Brief Therapy Activity   Ambrose MantleMareida Grossman-Orr, LCSW

## 2017-03-27 NOTE — Progress Notes (Signed)
[facility] Love Progress Note  03/27/2017 3:41 PM [name]  MRN:  [id number]   Subjective:  [name] Love is awake and alert, seen sitting in the day room. [name] Love continues present guarded and paranoid.  Denies suicidal or homicidal ideations (with head nodding). Patient abruptly left this discussion. Per staffing notes patient seen responding  to internal stimuli and preoccupied with thoughts. Per nursing staff [name] Love is medica ition complaint.  NP to make medications adjustments see MAR. Support, encouragement and reassurance was provided.     Principal Problem: Schizophrenia, paranoid type (HCC) Diagnosis:   Patient Active Problem List   Diagnosis Date Noted  . Aggression [R46.89]   . 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 Cannabis use disorder, severe, dependence (HCC) [F12.20] 11/26/2014   Total Time spent with patient: 30 minutes  Past Psychiatric History: see H&P  Past Medical History:  Past Medical History:  Diagnosis Date  . Schizophrenia, acute (HCC)    History reviewed. No pertinent surgical history. Family History:  Family History  Problem Relation Age of Onset  . Mental illness Cousin    Family Psychiatric  History: see H&P Social History:  Social History   Substance and Sexual Activity  Alcohol Use Yes   Comment: a little      Social History   Substance and Sexual Activity  Drug Use No   Comment: denies    Social History   Socioeconomic History  . Marital status: Single    Spouse name: None  . Number of children: None  . Years of education: None  . Highest education level: None  Social Needs  . Financial resource strain: None  . Food insecurity - worry: None  . Food insecurity - inability: None  . Transportation needs - medical: None  . Transportation needs - non-medical: None  Occupational History  . None  Tobacco Use  . Smoking status:  Current Every Day Smoker    Packs/day: 0.00    Years: 4.00    Pack years: 0.00  . Smokeless tobacco: Never Used  Substance and Sexual Activity  . Alcohol use: Yes    Comment: a little   . Drug use: No    Comment: denies  . Sexual activity: Not Currently  Other Topics Concern  . None  Social History Narrative  . None   Additional Social History:                         Sleep: Fair  Appetite:  Poor  Current Medications: Current Facility-Administered Medications  Medication Dose Route Frequency Provider Last Rate Last Dose  . acetaminophen (TYLENOL) tablet 650 mg  650 mg Oral Q6H PRN Laveda Abbe, NP      . alum & mag hydroxide-simeth (MAALOX/MYLANTA) 200-200-20 MG/5ML suspension 30 mL  30 mL Oral Q4H PRN Laveda Abbe, NP      . ARIPiprazole (ABILIFY) tablet 15 mg  15 mg Oral Daily Oneta Rack, NP   15 mg at 03/27/17 0749  . ARIPiprazole ER SRER 400 mg  400 mg Intramuscular Q30 days [name] Likens, Love   400 mg at 03/25/17 1220  . diphenhydrAMINE (BENADRYL) capsule 25 mg  25 mg Oral Q6H PRN Laveda Abbe, NP       Or  . diphenhydrAMINE (BENADRYL) injection 25 mg  25 mg Intramuscular Q6H PRN Laveda Abbe, NP      .  divalproex (DEPAKOTE ER) 24 hr tablet 750 mg  750 mg Oral QHS [name] [place], [name] Love   750 mg at 03/26/17 2030  . hydrOXYzine (ATARAX/VISTARIL) tablet 25 mg  25 mg Oral TID PRN Laveda AbbeParks, Laurie Britton, NP   25 mg at 03/25/17 2029  . LORazepam (ATIVAN) injection 2 mg  2 mg Intramuscular Q4H PRN Laveda AbbeParks, Laurie Britton, NP   2 mg at 03/22/17 1522  . LORazepam (ATIVAN) tablet 1 mg  1 mg Oral Q6H PRN Laveda AbbeParks, Laurie Britton, NP   1 mg at 03/27/17 0749  . magnesium hydroxide (MILK OF MAGNESIA) suspension 30 mL  30 mL Oral Daily PRN Laveda AbbeParks, Laurie Britton, NP      . traZODone (DESYREL) tablet 100 mg  100 mg Oral QHS PRN Laveda AbbeParks, Laurie Britton, NP   100 mg at 03/23/17 2123  . ziprasidone (GEODON) injection 10 mg  10 mg  Intramuscular BID PRN Laveda AbbeParks, Laurie Britton, NP   10 mg at 03/23/17 [id number]    Lab Results: No results found for this or any previous visit (from the past 48 hour(s)).  Blood Alcohol level:  Lab Results  Component Value Date   ETH <10 03/21/2017   ETH <5 05/15/2016    Metabolic Disorder Labs: Lab Results  Component Value Date   HGBA1C 5.4 07/15/2015   MPG 117 11/27/2014   Lab Results  Component Value Date   PROLACTIN 57.5 (H) 08/20/2015   PROLACTIN 23.2 (H) 02/04/2015   Lab Results  Component Value Date   CHOL 225 (H) 07/15/2015   TRIG 74 07/15/2015   HDL 58 07/15/2015   CHOLHDL 3.9 07/15/2015   VLDL 15 07/15/2015   LDLCALC 152 (H) 07/15/2015   LDLCALC 164 (H) 11/27/2014    Physical Findings: AIMS: Facial and Oral Movements Muscles of Facial Expression: None, normal Lips and Perioral Area: None, normal Jaw: None, normal Tongue: None, normal,Extremity Movements Upper (arms, wrists, hands, fingers): None, normal Lower (legs, knees, ankles, toes): None, normal, Trunk Movements Neck, shoulders, hips: None, normal, Overall Severity Severity of abnormal movements (highest score from questions above): None, normal Incapacitation due to abnormal movements: None, normal Patient's awareness of abnormal movements (rate only patient's report): No Awareness, Dental Status Current problems with teeth and/or dentures?: No Does patient usually wear dentures?: No  CIWA:    COWS:     Musculoskeletal: Strength & Muscle Tone: within normal limits Gait & Station: normal Patient leans: N/A  Psychiatric Specialty Exam: Physical Exam  Nursing note and vitals reviewed. Constitutional: He is oriented to person, place, and time. He appears well-developed.  Neurological: He is alert and oriented to person, place, and time.  Psychiatric: His behavior is normal.    Review of Systems  Psychiatric/Behavioral: Negative for depression. The patient has insomnia.   All other systems  reviewed and are negative.   Blood pressure 126/68, pulse 98, temperature 98.5 F (36.9 C), temperature source Oral, resp. rate 16, height 5\' 11"  (1.803 m), weight 74.8 kg (165 lb).Body mass index is 23.01 kg/m.  General Appearance: Bizarre and Casual  Eye Contact:  Good  Speech:  Clear and Coherent and Normal Rate  Volume:  Normal  Mood:  Anxious and Irritable  Affect:  Blunt and Flat  Thought Process:  Descriptions of Associations: Loose  Orientation:  Other:  UTA  Thought Content:  Logical  Suicidal Thoughts:  No  Homicidal Thoughts:  No  Memory:  Immediate;   Fair Recent;   Fair Remote;   Fair  Judgement:  Impaired  Insight:  Lacking  Psychomotor Activity:  Normal  Concentration:  Concentration: Poor  Recall:  Poor  Fund of Knowledge:  Poor  Language:  Fair  Akathisia:  No  Handed:    AIMS (if indicated):     Assets:  Manufacturing systems engineer Physical Health Resilience Social Support  ADL's:  Intact  Cognition:  WNL  Sleep:  Number of Hours: 5.25     Treatment Plan Summary: Daily contact with patient to assess and evaluate symptoms and progress in treatment and Medication management.   - Continue with current treatment plan on 03/26/2017 except where noted   -Schizophrenia             -Increased  abilify 10 mg to 15mg   po qday             - Continue Depakote ER 750mg  qhs (depakote level on 03/28/17)   - Abilify SRER 400mg  IM q30 days ( first dose 03/25/17)  -Anxiety/agitation             - Continue atarax 25mg  po q8h prn anxiety             - Continue ativan/geodon PRN's for agitation  -Insomnia             - Continue trazodone 100mg  po qhs prn insomnia  -Encourage participation in groups and therapeutic milieu  -Discharge planning will be ongoing     Oneta Rack, NP 03/26/2017, 3:41 PM

## 2017-03-28 MED ORDER — TUBERCULIN PPD 5 UNIT/0.1ML ID SOLN
5.0000 [IU] | Freq: Once | INTRADERMAL | Status: AC
  Administered 2017-03-28: 5 [IU] via INTRADERMAL
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
## 2017-03-28 NOTE — Progress Notes (Signed)
Patient denies SI, HI and AVH.  Patient has been compliant with medications and attended groups.   Assess patient for safety, offer medications as prescribed engage patient in 1:1 staff talks.   Patient able to contract for safety.  Continue to monitor as planned.

## 2017-03-28 NOTE — [facility] Group Notes (Signed)
LCSW Group Therapy Note   03/28/2017 1:15pm   Type of Therapy and Topic:  Group Therapy:  Positive Affirmations   Participation Level:  Did Not Attend  Description of Group: This group addressed positive affirmation toward self and others. Patients went around the room and identified two positive things about themselves and two positive things about a peer in the room. Patients reflected on how it felt to share something positive with others, to identify positive things about themselves, and to hear positive things from others. Patients were encouraged to have a daily reflection of positive characteristics or circumstances.  Therapeutic Goals 1. Patient will verbalize two of their positive qualities 2. Patient will demonstrate empathy for others by stating two positive qualities about a peer in the group 3. Patient will verbalize their feelings when voicing positive self affirmations and when voicing positive affirmations of others 4. Patients will discuss the potential positive impact on their wellness/recovery of focusing on positive traits of self and others. Summary of Patient Progress:    Therapeutic Modalities Cognitive Behavioral Therapy Motivational Interviewing  [name] Love, [place]CS[place] 03/28/2017 4:26 PM

## 2017-03-28 NOTE — Progress Notes (Signed)
Patient has been isolative to his room tonight. Writer had to request that he come to medication window for his Depakote. He refused this medication reporting that he did not like how it makes him feel. He did take medication to aid with anxiety and sleep. Writer encouraged that he speak with the doctor on tomorrow concerning his medication. Safety maintained on unit with 15 min checks.

## 2017-03-28 NOTE — Progress Notes (Signed)
Adult Psychoeducational Group Note  Date:  03/28/2017 Time:  8:49 PM  Group Topic/Focus:  Wrap-Up Group:   The focus of this group is to help patients review their daily goal of treatment and discuss progress on daily workbooks.  Participation Level:  Active  Participation Quality:  Appropriate  Affect:  Appropriate  Cognitive:  Appropriate  Insight: Appropriate  Engagement in Group:  Engaged  Modes of Intervention:  Discussion  Additional Comments:  The patient expressed that he attended Coping Skills.  Octavio Mannshigpen, Leeya Rusconi Lee 03/28/2017, 8:49 PM

## 2017-03-28 NOTE — Tx Team (Signed)
Interdisciplinary Treatment and Diagnostic Plan Update  03/28/2017 Time of Session: 8:32 AM  [name] Love MRN: [id number]  Principal Diagnosis: Schizophrenia, paranoid type ([place])  Secondary Diagnoses: Principal Problem:   Schizophrenia, paranoid type ([place])   Current Medications:  Current Facility-Administered Medications  Medication Dose Route Frequency Provider Last Rate Last Dose  . acetaminophen (TYLENOL) tablet 650 mg  650 mg Oral Q6H PRN Ethelene Hal, NP      . alum & mag hydroxide-simeth (MAALOX/MYLANTA) 200-200-20 MG/5ML suspension 30 mL  30 mL Oral Q4H PRN Ethelene Hal, NP      . ARIPiprazole (ABILIFY) tablet 15 mg  15 mg Oral Daily Derrill Center, NP   15 mg at 03/27/17 0749  . ARIPiprazole ER SRER 400 mg  400 mg Intramuscular Q30 days Pennelope Bracken, MD   400 mg at 03/25/17 1220  . diphenhydrAMINE (BENADRYL) capsule 25 mg  25 mg Oral Q6H PRN Ethelene Hal, NP       Or  . diphenhydrAMINE (BENADRYL) injection 25 mg  25 mg Intramuscular Q6H PRN Ethelene Hal, NP      . divalproex (DEPAKOTE ER) 24 hr tablet 750 mg  750 mg Oral QHS Pennelope Bracken, MD   750 mg at 03/26/17 2030  . hydrOXYzine (ATARAX/VISTARIL) tablet 25 mg  25 mg Oral TID PRN Ethelene Hal, NP   25 mg at 03/27/17 2126  . LORazepam (ATIVAN) injection 2 mg  2 mg Intramuscular Q4H PRN Ethelene Hal, NP   2 mg at 03/22/17 1522  . LORazepam (ATIVAN) tablet 1 mg  1 mg Oral Q6H PRN Ethelene Hal, NP   1 mg at 03/27/17 1821  . magnesium hydroxide (MILK OF MAGNESIA) suspension 30 mL  30 mL Oral Daily PRN Ethelene Hal, NP      . traZODone (DESYREL) tablet 100 mg  100 mg Oral QHS PRN Ethelene Hal, NP   100 mg at 03/27/17 2126  . ziprasidone (GEODON) injection 10 mg  10 mg Intramuscular BID PRN Ethelene Hal, NP   10 mg at 03/23/17 6759    PTA Medications: Medications Prior to Admission  Medication Sig Dispense Refill Last  Dose  . ARIPiprazole ER 400 MG SUSR Inject 400 mg into the muscle every 28 (twenty-eight) days. (Due to be given on 11-14-15): For mood control (Patient not taking: Reported on 05/15/2016) 1 each 0 Not Taking at Unknown time  . benztropine (COGENTIN) 0.5 MG tablet Take 1 tablet (0.5 mg total) by mouth at bedtime. For prevention of drug induced tremors (Patient not taking: Reported on 02/27/2017) 30 tablet 0 Not Taking at Unknown time  . divalproex (DEPAKOTE ER) 500 MG 24 hr tablet Take 2 tablets (1,000 mg total) by mouth at bedtime. (Patient not taking: Reported on 02/27/2017) 60 tablet 0 Not Taking at Unknown time  . haloperidol (HALDOL) 5 MG tablet Take 1 tablet (5 mg total) by mouth 2 (two) times daily. (Patient not taking: Reported on 02/27/2017) 60 tablet 0 Not Taking at Unknown time  . traZODone (DESYREL) 100 MG tablet Take 1 tablet (100 mg total) by mouth at bedtime as needed for sleep. (Patient not taking: Reported on 02/27/2017) 30 tablet 0 Not Taking at Unknown time    Patient Stressors: Marital or family conflict Medication change or noncompliance  Patient Strengths: General fund of knowledge Physical Health  Treatment Modalities: Medication Management, Group therapy, Case management,  1 to 1 session with clinician, Psychoeducation, Recreational therapy.  Physician Treatment Plan for Primary Diagnosis: Schizophrenia, paranoid type ([place]) Long Term Goal(s): Improvement in symptoms so as ready for discharge  Short Term Goals: Ability to identify and develop effective coping behaviors will improve Ability to demonstrate self-control will improve  Medication Management: Evaluate patient's response, side effects, and tolerance of medication regimen.  Therapeutic Interventions: 1 to 1 sessions, Unit Group sessions and Medication administration.  Evaluation of Outcomes: Progressing   1/14: Pt remains guarded, withdrawn, and appears to respond to internal stimuli. He has poor insight  regarding reasons for hospitalization and he has minimal insight regarding his illness. He slammed the door to the office unexpectedly after finding out he would not be leaving today. His mother would like to pursue group home placement. -Continue abilify 64m po qday - Continue Depakote ER 7561mqhs (depakote level on 03/28/17)             - Start Abilify SRER 40043mM q30 days (give first dose today 03/25/17)  -Anxiety/agitation - Continue atarax 53m21m q8h prn anxiety - Continue ativan/geodon PRN's for agitation  -Insomnia - Continue trazodone 100mg35mqhs prn insomnia   Physician Treatment Plan for Secondary Diagnosis: Principal Problem:   Schizophrenia, paranoid type (HCC) [place]ong Term Goal(s): Improvement in symptoms so as ready for discharge  Short Term Goals: Ability to identify and develop effective coping behaviors will improve Ability to demonstrate self-control will improve  Medication Management: Evaluate patient's response, side effects, and tolerance of medication regimen.  Therapeutic Interventions: 1 to 1 sessions, Unit Group sessions and Medication administration.  Evaluation of Outcomes: Progressing   RN Treatment Plan for Primary Diagnosis: Schizophrenia, paranoid type (HCC) Matthewsg Term Goal(s): Knowledge of disease and therapeutic regimen to maintain health will improve  Short Term Goals: Ability to identify and develop effective coping behaviors will improve and Compliance with prescribed medications will improve  Medication Management: RN will administer medications as ordered by provider, will assess and evaluate patient's response and provide education to patient for prescribed medication. RN will report any adverse and/or side effects to prescribing provider.  Therapeutic Interventions: 1 on 1 counseling sessions, Psychoeducation, Medication administration, Evaluate responses to treatment, Monitor  vital signs and CBGs as ordered, Perform/monitor CIWA, COWS, AIMS and Fall Risk screenings as ordered, Perform wound care treatments as ordered.  Evaluation of Outcomes: Progressing   LCSW Treatment Plan for Primary Diagnosis: Schizophrenia, paranoid type (HCC) [place] Term Goal(s): Safe transition to appropriate next level of care at discharge, Engage patient in therapeutic group addressing interpersonal concerns.  Short Term Goals: Engage patient in aftercare planning with referrals and resources  Therapeutic Interventions: Assess for all discharge needs, 1 to 1 time with Social worker, Explore available resources and support systems, Assess for adequacy in community support network, Educate family and significant other(s) on suicide prevention, Complete Psychosocial Assessment, Interpersonal group therapy.  Evaluation of Outcomes: Met  Return home, follow up EvansJinny Blossom:  Mother is requesting we try to find placement for [name] Love.  Ms Pate Jeannett Seniorcheduled to interview him today   Progress in Treatment: Attending groups: Yes Participating in groups: Yes Taking medication as prescribed: Yes Toleration medication: Yes, no side effects reported at this time Family/Significant other contact made: No Patient understands diagnosis: No Limited insight Discussing patient identified problems/goals with staff: Yes Medical problems stabilized or resolved: Yes Denies suicidal/homicidal ideation: Yes Issues/concerns per patient self-inventory: None Other: N/A  New problem(s) identified: None identified at this time.   New Short Term/Long Term  Goal(s): "I don't need to be here"  Discharge Plan or Barriers:   Reason for Continuation of Hospitalization: Paranoia Disorganization Medication stabilization   Estimated Length of Stay: 1/18  Attendees: Patient:  03/28/2017  8:32 AM  Physician: Maris Berger, MD 03/28/2017  8:32 AM  Nursing: Elesa Massed, RN 03/28/2017  8:32 AM  RN Care  Manager: Lars Pinks, RN 03/28/2017  8:32 AM  Social Worker: Ripley Fraise 03/28/2017  8:32 AM  Recreational Therapist: Winfield Cunas 03/28/2017  8:32 AM  Other: Norberto Sorenson 03/28/2017  8:32 AM  Other:  03/28/2017  8:32 AM    Scribe for Treatment Team:  Roque Lias LCSW 03/28/2017 8:32 AM

## 2017-03-28 NOTE — Progress Notes (Signed)
Recreation Therapy Notes  Date: 03/28/17 Time: 1000 Location: 500 [address] Dayroom  Group Topic: Coping Skills  Goal Area(s) Addresses:  Patients will be able to identify positive coping skills. Patients will be able to identify the benefits of using coping skills post d/c.  Behavioral Response: Minimal  Intervention: Worksheet, dry erase board, dry erase marker  Activity: Mind map.  Patients were given a blank mind map.  LRT and patients filled out the first 8 boxes together with anxiety, depression, paranoia, fear, concentration, finances, hygiene and disappointment.  Individually, patients were to then come up with 3 coping skills for each area identified.  The group would then reconvene and LRT would put the coping skills on the board.  Education: PharmacologistCoping Skills, Building control surveyorDischarge Planning.   Education Outcome: Acknowledges understanding/In group clarification offered/Needs additional education.   Clinical Observations/Feedback: Pt was appropriate during group.  Some of his answers were not on topic.  Pt stated his coping skill as exercise and sports.   Caroll RancherMarjette Stan Cantave, LRT/CTRS     [name], [name] A 03/28/2017 1:38 PM

## 2017-03-29 NOTE — Progress Notes (Signed)
Recreation Therapy Notes  Date: 03/29/17 Time: 1000 Location: 500 Hall Dayroom  Group Topic: Leisure Education, Goal Setting  Goal Area(s) Addresses:  Patient will be able to identify at least 3 life goals.  Patient will be able to identify benefit of investing in life goals.  Patient will be able to identify benefit of setting life goals.   Behavioral Response:  Minimal  Intervention: Worksheet  Activity: Goal Planning.  Patients were given a worksheet in which they had to identify goals they wanted to accomplish within the next week, month, year and five years.  Patients were to then identify the obstacles to reaching their goals, what they will need to do to achieve their goals and what they can start doing immediately to work towards their goals.  Education:  Discharge Planning, PharmacologistCoping Skills, Leisure Education   Education Outcome: Acknowledges Education/In Group Clarification Provided/Needs Additional Education  Clinical Observation:  Pt stated in the next week he wants to get some new shoe laces; in the next month beat [employer] and get some new Jordans; in a year he wants to get a job and five years from now he wants to get married.       Caroll RancherMarjette Jaena Brocato, LRT/CTRS     [name], Zara Wendt A 03/29/2017 11:44 AM

## 2017-03-29 NOTE — Progress Notes (Signed)
Patient ID: [name] Love, male   DOB: 09-Nov-1995, 22 y.o.   MRN: [id number]  D: Pt is confused, isolative and withdrawn to his room. Pt denied any form of depression, anxiety, pain, SI, HI and AVH; Pt was however observed responding to some internal stimuli; "there is something going on with my head." Pt remained calm and cooperative. A: Medications offered as prescribed. Support, encouragement, and safe environment provided. 15-minute safety checks continue. R: Pt was med compliant. Safety checks continue

## 2017-03-29 NOTE — [facility] Group Notes (Signed)
LCSW Group Therapy Note  03/29/2017 1:15pm  Type of Therapy/Topic:  Group Therapy:  Balance in Life  Participation Level:  Active  Description of Group:    This group will address the concept of balance and how it feels and looks when one is unbalanced. Patients will be encouraged to process areas in their lives that are out of balance and identify reasons for remaining unbalanced. Facilitators will guide patients in utilizing problem-solving interventions to address and correct the stressor making their life unbalanced. Understanding and applying boundaries will be explored and addressed for obtaining and maintaining a balanced life. Patients will be encouraged to explore ways to assertively make their unbalanced needs known to significant others in their lives, using other group members and facilitator for support and feedback.  Therapeutic Goals: 1. Patient will identify two or more emotions or situations they have that consume much of in their lives. 2. Patient will identify signs/triggers that life has become out of balance:  3. Patient will identify two ways to set boundaries in order to achieve balance in their lives:  4. Patient will demonstrate ability to communicate their needs through discussion and/or role plays  Summary of Patient Progress:  Stayed the entire time, engaged throughout.  At one point another patient complained that "he is bothering me."  However, she moved to where she could not see him and it did not escalate.  He interacted in a bizarre fashion.  Put his hand up multiple times, but when called on either asked what the question was, or made a statement unrelated to the subject at hand.  Was working on a skateboard theme early in group, but when asked about his interest, stated that he has not skateboarded, nor does he own one.    Therapeutic Modalities:   Cognitive Behavioral Therapy Solution-Focused Therapy Assertiveness Training  Ida RogueRodney B [name],  [place] 03/29/2017 1:23 PM

## 2017-03-29 NOTE — Progress Notes (Signed)
D: Assumed care of patient at [id number] from [place], [place]. Patient up and visible on unit however guarded, watchful, preoccupied. Sitting at nurses station on the floor with blank affect. Forwards little to no information on assessment. Denies any concerns or AVH however observed responding to internal stimuli. Laughing inappropriately at med window for no apparent reason. Displays thought blocking.  Per self inventory and discussions with writer, rates depression, hopelessness and anxiety all at a 0/10. Rates sleep as poor, appetite as poor, energy as low and concentration as poor.  States goal for today is to "discharge." Denies pain, physical complaints.   A: Medicated per orders, prn ativan given late morning for anxiety and again before dinner. Level III obs in place for safety. Emotional support offered and self inventory reviewed. Encouraged programming participation. Discussed POC with MD, SW.   R: Patient verbalizes understanding of POC. Patient denies SI/HI and remains safe on level III obs. Will continue to monitor closely and make verbal contact frequently.

## 2017-03-29 NOTE — Plan of Care (Signed)
Patient quite guarded, forwards little to no information. Denies any psychiatric symptoms however appears to respond to internal stimuli. Suspicious affect with congruent mood.  Patient has not engaged in self harm, denies thoughts to do so.
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
## 2017-03-30 NOTE — Progress Notes (Signed)
Recreation Therapy Notes  Date: 03/30/17 Time: 1000 Location: 500 Hall Dayroom  Group Topic: Self-Esteem  Goal Area(s) Addresses:  Patient will successfully identify positive attributes about themselves.  Patient will successfully identify benefit of improved self-esteem.   Intervention: Blank crest, markers  Activity: Crest of Arms.  Patients were given Love blank crest divided into four boxes.  Patients were to highlight people, events, important dates or accomplishments that helped to shape the person they have become.  Education: Self-Esteem, Discharge Planning.   Education Outcome: Acknowledges education/In group clarification offered/Needs additional education  Clinical Observations/Feedback: Pt did not attend group.    Michoel Kunin, LRT/CTRS         [name] Love 03/30/2017 12:13 PM 

## 2017-03-30 NOTE — Plan of Care (Signed)
Patient has refrained from self harm, and denies thoughts to do so at this time. Will continue to monitor.

## 2017-03-30 NOTE — Progress Notes (Signed)
D: Patient alert, in hallway observing other peers on the unit. Patient appears to be preoccupied with discharge as he is noted telling peers that he will be discharging. Affect/mood: Flat, blunted, guarded. Denies SI, HI, AVH at this time.  Patient does not maintain eye contact with ths writer during interaction, though has been observed talking to peers and using the phone in the hallway. Also observed laughing while in hallway with unknown reason.  Remains superficial and does not share a goal when asked, but does request to be discharged. Denies pain.   A: Scheduled medications administered to patient per MD order. Support and encouragement provided. Routine safety checks conducted every 15 minutes. Patient informed to notify staff with problems or concerns. PPD skin test read at 1312. Results: No induration noted at injection site, 0 mm.   R: No adverse drug reactions noted. Patient contracts for safety at this time. Patient compliant with medications at this time. Patient continues to demonstrate lack of insight in regard to circumstances that led to this admission. Will continue to monitor. Patient safe at this time.
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
## 2017-03-30 NOTE — [facility] Group Notes (Signed)
LCSW Group Therapy Note   03/30/2017 1:15pm   Type of Therapy and Topic:  Group Therapy:  Trust and Honesty  Participation Level:  Minimal  Description of Group:    In this group patients will be asked to explore the value of being honest.  Patients will be guided to discuss their thoughts, feelings, and behaviors related to honesty and trusting in others. Patients will process together how trust and honesty relate to forming relationships with peers, family members, and self. Each patient will be challenged to identify and express feelings of being vulnerable. Patients will discuss reasons why people are dishonest and identify alternative outcomes if one was truthful (to self or others). This group will be process-oriented, with patients participating in exploration of their own experiences, giving and receiving support, and processing challenge from other group members.   Therapeutic Goals: 1. Patient will identify why honesty is important to relationships and how honesty overall affects relationships.  2. Patient will identify a situation where they lied or were lied too and the  feelings, thought process, and behaviors surrounding the situation 3. Patient will identify the meaning of being vulnerable, how that feels, and how that correlates to being honest with self and others. 4. Patient will identify situations where they could have told the truth, but instead lied and explain reasons of dishonesty.   Summary of Patient Progress  "I smoked 3 cigarettes and they didn't belong to me." For the most part irrelevent and random.  Easily engaged in side conversations.  Stayed the entire time.  Therapeutic Modalities:   Cognitive Behavioral Therapy Solution Focused Therapy Motivational Interviewing Brief Therapy  Ida RogueRodney B Ijeoma Loor, LCSW 03/30/2017 1:37 PM

## 2017-03-31 MED ORDER — NICOTINE 14 MG/24HR TD PT24
14.0000 mg | MEDICATED_PATCH | Freq: Every day | TRANSDERMAL | Status: DC
  Administered 2017-03-31 – 2017-04-01 (×2): 14 mg via TRANSDERMAL
  Filled 2017-03-31 (×3): qty 1
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
## 2017-03-31 NOTE — Progress Notes (Signed)
Recreation Therapy Notes  Date: 03/31/17 Time: 0940 Location: [address] Dayroom  Group Topic: Leisure Education  Goal Area(s) Addresses:  Patient will identify positive leisure activities.  Patient will identify one positive benefit of participation in leisure activities.   Behavioral Response: Minimal  Intervention: [employer]White board, dry erase marker, container of words  Activity: Pictionary.  Each patient would get a chance to come up to the board.  The patient would then pick a slip of paper from the container and attempt to draw what was on the paper on the board.  The remaining patients would then guess what the picture is.  The person that guesses correctly, gets the next turn.  Education:  Leisure Education, Building control surveyorDischarge Planning  Education Outcome: Acknowledges education/In group clarification offered/Needs additional education  Clinical Observations/Feedback: Pt was quiet but participated.  Pt didn't attempt to make any guesses at the pictures but did draw a few pictures when other peers didn't want to.  Pt was pleasant and able to focus when in group.    Caroll RancherMarjette Suvi Love, LRT/CTRS     [name], Earle Burson A 03/31/2017 12:39 PM

## 2017-03-31 NOTE — Progress Notes (Signed)
Patient ID: [name] Love, male   DOB: 07/28/1995, 22 y.o.   MRN: [id number]   D: Patient sitting on the floor in his room when first approaching him after shift change. Reported that he was ok and just relaxing at this time. Currently denies hearing voices, SI or HI. Conversation minimal. Interacting some with another peer but mostly staying to self. A: Staff will monitor on q 15 minute checks, follow treatment plan, and give meds. R: Took meds without issue tonight.

## 2017-03-31 NOTE — [facility] Group Notes (Signed)
[facility] LCSW Group Therapy 03/31/2017 1:15pm  Type of Therapy: Group Therapy- Feelings Around Discharge & Establishing a Supportive Framework  Participation Level:  None  Description of Group:   What is a supportive framework? What does it look like feel like and how do I discern it from and unhealthy non-supportive network? Learn how to cope when supports are not helpful and don't support you. Discuss what to do when your family/friends are not supportive.  Summary of Patient Progress [name] Love attended group but slept in the chair the entire time.  He did not share anything.   Therapeutic Modalities:   Cognitive Behavioral Therapy Person-Centered Therapy Motivational Interviewing   Aram Beechamngel M [place] Huberty, Student-Social Work 03/31/2017 2:31 PM

## 2017-03-31 NOTE — Plan of Care (Signed)
Nurse discussed depression, anxiety, coping skills with patient.  

## 2017-03-31 NOTE — [facility] Group Notes (Signed)
Adult Psychoeducational Group Note  Date:  03/31/2017 Time:  9:11 PM  Group Topic/Focus:  Wrap-Up Group:   The focus of this group is to help patients review their daily goal of treatment and discuss progress on daily workbooks.  Participation Level:  Minimal  Participation Quality:  Appropriate  Affect:  Appropriate  Cognitive:  Alert and Appropriate  Insight: Lacking  Engagement in Group:  Engaged  Modes of Intervention:  Discussion and Education  Additional Comments:  Pt attended and participated in wrap up group. Pt rated their day a 3/10 because they have not seen their cousin '[place]'. Pt goal was to get discharged from [facility]. A positive noted by the pt was that they met the rapper '[employer]'.   Cristi Loron 03/31/2017, 9:11 PM

## 2017-03-31 NOTE — Progress Notes (Signed)
D:  Patient's self inventory sheet, patient sleeps good, no sleep medication given.  Good appetite, hyper energy level.  Rated depression 1, hopeless 2, anxiety 3.  Withdrawals, did not check any withdrawals.  Denied SI.  Denied physical problems.  Physical pain, did not make any notations.  Goal is to "work".  Plans to be in "kitchen".  No discharge plans. A:  Medications administered per MD orders.  Emotional support and encouragement given patient. R:  Patient denied SI and HI while talking to nurse this morning, contracts for safety.  Denied A/V hallucinations.  Safety maintained with 15 minute checks.

## 2017-04-01 MED ORDER — ARIPIPRAZOLE ER 400 MG IM SRER: 400.0000 mg | INTRAMUSCULAR | 0 refills | Status: DC

## 2017-04-01 MED ORDER — LORAZEPAM 1 MG PO TABS: 1.0000 mg | ORAL_TABLET | Freq: Four times a day (QID) | ORAL | 0 refills | Status: DC | PRN

## 2017-04-01 MED ORDER — NICOTINE 14 MG/24HR TD PT24: 14.0000 mg | MEDICATED_PATCH | Freq: Every day | TRANSDERMAL | 0 refills | Status: DC

## 2017-04-01 MED ORDER — ARIPIPRAZOLE 15 MG PO TABS: 15.0000 mg | ORAL_TABLET | Freq: Every day | ORAL | 0 refills | Status: DC

## 2017-04-01 MED ORDER — DIVALPROEX SODIUM ER 250 MG PO TB24: 750.0000 mg | ORAL_TABLET | Freq: Every day | ORAL | 0 refills | Status: DC

## 2017-04-01 MED ORDER — TRAZODONE HCL 100 MG PO TABS: 100.0000 mg | ORAL_TABLET | Freq: Every evening | ORAL | 0 refills | Status: DC | PRN

## 2017-04-01 MED ORDER — HYDROXYZINE HCL 25 MG PO TABS: 25.0000 mg | ORAL_TABLET | Freq: Three times a day (TID) | ORAL | 0 refills | Status: DC | PRN

## 2017-04-01 NOTE — Plan of Care (Signed)
Pt attended and participated in various recreation therapy sessions.  Earvin Blazier, LRT/CTRS 

## 2017-04-01 NOTE — Progress Notes (Signed)
Recreation Therapy Notes  INPATIENT RECREATION TR PLAN  Patient Details Name: [name] Love MRN: [id number] DOB: [birth year] Today's Date: 04/01/2017  Rec Therapy Plan Is patient appropriate for Therapeutic Recreation?: Yes Treatment times per week: about 3 days Estimated Length of Stay: 5-7 days TR Treatment/Interventions: Group participation (Comment)  Discharge Criteria Pt will be discharged from therapy if:: Discharged Treatment plan/goals/alternatives discussed and agreed upon by:: Patient/family  Discharge Summary Short term goals set: Pt will attend and participate in recreation therapy sessions. Short term goals met: Complete Progress toward goals comments: Groups attended Which groups?: Wellness, Communication, Goal setting, Coping skills, Leisure education Reason goals not met: None Therapeutic equipment acquired: N/A Reason patient discharged from therapy: Discharge from hospital Pt/family agrees with progress & goals achieved: Yes Date patient discharged from therapy: 04/01/17    Victorino Sparrow, LRT/CTRS  Ria Comment, Ivee Poellnitz A 04/01/2017, 12:56 PM

## 2017-04-01 NOTE — Progress Notes (Signed)
D: Pt denies SI/HI/AV hallucinations. Pt is pleasant and cooperative. Pt walking in hall this shift.  A: Pt was offered support and encouragement. Pt was given scheduled medications. Pt was encourage to attend groups. Q 15 minute checks were done for safety.  R:Pt attends groups and interacts well with peers and staff. Pt is taking medication. Pt has no complaints.Pt receptive to treatment and safety maintained on unit.

## 2017-04-01 NOTE — Plan of Care (Signed)
Patient adjusting to unit. Patient able to come to this writer and identify when he need medication for anxiety tonight. Patient interacts with staff and peers well. Monitoring continues.
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
## 2017-04-01 NOTE — Progress Notes (Signed)
Recreation Therapy Notes  Date: 04/01/17 Time: 1000 Location: 500 Hall  Group Topic: Communication, Team Building, Problem Solving  Goal Area(s) Addresses:  Patient will effectively work with peer towards shared goal.  Patient will identify skills used to make activity successful.  Patient will identify how skills used during activity can be used to reach post d/c goals.   Behavioral Response: Engaged  Intervention: Social workerubber disc  Activity: [place] in [employer].  Each person was given a rubber disc plus one extra disc for the group.  As a team, the patients will use the discs to move everyone from the starting point to the end of the hall and back.  If anyone stepped off the disc, the group would have to start over from the beginning.  Education:Social Skills, Discharge Planning.   Education Outcome: Acknowledges education/In group clarification offered/Needs additional education.   Clinical Observations/Feedback: Pt was quiet but engaged in the activity.  Pt worked well with his peers.       Caroll RancherMarjette Kindra Bickham, LRT/CTRS     [name], Eleonora Peeler A 04/01/2017 12:40 PM

## 2017-04-01 NOTE — Progress Notes (Signed)
Pt d/c from the hospital with his mom. All items returned. D/C instructions given and prescriptions given. Pt denies si and hi. Pt's mother left her phone number at discharge [phone] [name]

## 2018-03-13 ENCOUNTER — Emergency Department (HOSPITAL_COMMUNITY)
Admission: EM | Admit: 2018-03-13 | Discharge: 2018-03-13 | Disposition: A | Discharge status: Home / Self Care | Payer: Medicaid Other | Attending: Emergency Medicine | Admitting: Emergency Medicine

## 2018-03-13 ENCOUNTER — Encounter (HOSPITAL_COMMUNITY): Payer: Self-pay | Admitting: *Deleted

## 2018-03-13 ENCOUNTER — Other Ambulatory Visit: Payer: Self-pay

## 2018-03-13 DIAGNOSIS — R040 Epistaxis: Secondary | ICD-10-CM | POA: Insufficient documentation

## 2018-03-13 DIAGNOSIS — Z91010 Allergy to peanuts: Secondary | ICD-10-CM | POA: Diagnosis not present

## 2018-03-13 DIAGNOSIS — R51 Headache: Secondary | ICD-10-CM | POA: Diagnosis not present

## 2018-03-13 DIAGNOSIS — Z91040 Latex allergy status: Secondary | ICD-10-CM | POA: Insufficient documentation

## 2018-03-13 DIAGNOSIS — F17210 Nicotine dependence, cigarettes, uncomplicated: Secondary | ICD-10-CM | POA: Insufficient documentation

## 2018-03-13 LAB — CBC
HCT: 41.2 % (ref 39.0–52.0)
Hemoglobin: 13.3 g/dL (ref 13.0–17.0)
MCH: 29.3 pg (ref 26.0–34.0)
MCHC: 32.3 g/dL (ref 30.0–36.0)
MCV: 90.7 fL (ref 80.0–100.0)
NRBC: 0 % (ref 0.0–0.2)
PLATELETS: 371 10*3/uL (ref 150–400)
RBC: 4.54 MIL/uL (ref 4.22–5.81)
RDW: 12 % (ref 11.5–15.5)
WBC: 8.4 10*3/uL (ref 4.0–10.5)

## 2018-03-13 LAB — BASIC METABOLIC PANEL
ANION GAP: 13 (ref 5–15)
BUN: 12 mg/dL (ref 6–20)
CALCIUM: 9.3 mg/dL (ref 8.9–10.3)
CO2: 26 mmol/L (ref 22–32)
Chloride: 99 mmol/L (ref 98–111)
Creatinine, Ser: 1.02 mg/dL (ref 0.61–1.24)
Glucose, Bld: 75 mg/dL (ref 70–99)
Potassium: 3.3 mmol/L — ABNORMAL LOW (ref 3.5–5.1)
SODIUM: 138 mmol/L (ref 135–145)

## 2018-03-13 NOTE — ED Notes (Signed)
Pt states he cannot give urine sample at this time.

## 2018-03-13 NOTE — ED Notes (Signed)
Provided pt with a urinal 

## 2018-03-13 NOTE — ED Notes (Signed)
Meal given and cleaned pt hands

## 2018-03-13 NOTE — ED Triage Notes (Signed)
Pt's mother reports 2 days of h/a and dizziness.  He started to have R epistaxis today, no active bleeding.  Mom reports pt stopped taking his antipsychotics x 2 months ago, have had decreased appetite for the past month and is concerned that he is withdrawing.  Pt has an appt with [facility] tomorrow in hopes to get pt back on meds, but she wants to make sure that there is nothing wrong with pt medically.

## 2018-03-14 NOTE — ED Provider Notes (Signed)
[facility]-EMERGENCY DEPT Provider Note   CSN: [id number] Arrival date & time: 03/13/18  1930     History   Chief Complaint Chief Complaint  Patient presents with  . Headache  . Epistaxis    HPI [name] Love is a 22 y.o. male.  HPI Patient is a 22 year old male with a history of schizophrenia who is currently noncompliant with his medications.  His mother is planning on [facility] to reestablish mental health care and reinitiate his medications.  She brought him to the emergency department tonight because he had a right-sided nosebleed which was somewhat difficult to control but now has since stopped.  Patient had reported mild headache and dizziness.  She is concerned about his overall oral intake.  Patient is without focus complaint at this time.  Patient denies abdominal pain chest pain.  No active nosebleed at this time.  No recent upper respiratory symptoms   Past Medical History:  Diagnosis Date  . Schizophrenia, acute [facility](HCC)     Patient Active Problem List   Diagnosis Date Noted  . Aggression   . 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 Schizophrenia, paranoid type (HCC) 08/17/2015  . Hyperprolactinemia (HCC)   . Cannabis use disorder, severe, dependence (HCC) 11/26/2014    History reviewed. No pertinent surgical history.      Home Medications    Prior to Admission medications   Medication Sig Start Date End Date Taking? Authorizing Provider  ARIPiprazole (ABILIFY) 15 MG tablet Take 1 tablet (15 mg total) by mouth daily. For mood control 04/02/17   Armandina StammerNwoko, Agnes I, NP  ARIPiprazole ER 400 MG SRER Inject 400 mg into the muscle every 30 (thirty) days. (Due on 04-24-17): For mood control 04/24/17   Armandina StammerNwoko, Agnes I, NP  divalproex (DEPAKOTE ER) 250 MG 24 hr tablet Take 3 tablets (750 mg total) by mouth at bedtime. For mood stabilization 04/01/17   Armandina StammerNwoko, Agnes I, NP  hydrOXYzine (ATARAX/VISTARIL) 25 MG tablet Take  1 tablet (25 mg total) by mouth 3 (three) times daily as needed for anxiety. 04/01/17   Armandina StammerNwoko, Agnes I, NP  LORazepam (ATIVAN) 1 MG tablet Take 1 tablet (1 mg total) by mouth every 6 (six) hours as needed for anxiety. 04/01/17   Armandina StammerNwoko, Agnes I, NP  nicotine (NICODERM CQ - DOSED IN MG/24 HOURS) 14 mg/24hr patch Place 1 patch (14 mg total) onto the skin daily. (May purchase from over the counter): For smoking cessation 04/02/17   Armandina StammerNwoko, Agnes I, NP  traZODone (DESYREL) 100 MG tablet Take 1 tablet (100 mg total) by mouth at bedtime as needed for sleep. 04/01/17   Sanjuana KavaNwoko, Agnes I, NP    Family History Family History  Problem Relation Age of Onset  . Mental illness Cousin     Social History Social History   Tobacco Use  . Smoking status: Current Every Day Smoker    Packs/day: 0.00    Years: 4.00    Pack years: 0.00  . Smokeless tobacco: Never Used  Substance Use Topics  . Alcohol use: Yes    Comment: a little   . Drug use: No    Comment: denies     Allergies   Chocolate; Latex; Peanuts [peanut oil]; and Tape   Review of Systems Review of Systems  All other systems reviewed and are negative.    Physical Exam Updated Vital Signs BP (!) 141/87 (BP Location: Left Arm)   Pulse 91   Temp 97.9  F (36.6 C) (Oral)   Resp 15   Ht 6\' 2"  (1.88 m)   Wt 72.6 kg   SpO2 100%   BMI 20.54 kg/m   Physical Exam Vitals signs and nursing note reviewed.  Constitutional:      Appearance: He is well-developed.  HENT:     Head: Normocephalic and atraumatic.     Comments: Stigmata of recent right anterior epistaxis.  No active bleeding at this time. Neck:     Musculoskeletal: Normal range of motion.  Cardiovascular:     Rate and Rhythm: Normal rate and regular rhythm.     Heart sounds: Normal heart sounds.  Pulmonary:     Effort: Pulmonary effort is normal. No respiratory distress.     Breath sounds: Normal breath sounds.  Abdominal:     General: There is no distension.      Palpations: Abdomen is soft.     Tenderness: There is no abdominal tenderness.  Musculoskeletal: Normal range of motion.  Skin:    General: Skin is warm and dry.  Neurological:     Mental Status: He is alert and oriented to person, place, and time.  Psychiatric:        Attention and Perception: He is inattentive.        Mood and Affect: Affect is labile and flat.        Speech: Speech is delayed.        Behavior: Behavior is cooperative.        Cognition and Memory: Cognition normal.      ED Treatments / Results  Labs (all labs ordered are listed, but only abnormal results are displayed) Labs Reviewed  BASIC METABOLIC PANEL - Abnormal; Notable for the following components:      Result Value   Potassium 3.3 (*)    All other components within normal limits  CBC  URINALYSIS, ROUTINE W REFLEX MICROSCOPIC    EKG None  Radiology No results found.  Procedures Procedures (including critical care time)  Medications Ordered in ED Medications - No data to display   Initial Impression / Assessment and Plan / ED Course  I have reviewed the triage vital signs and the nursing notes.  Pertinent labs & imaging results that were available during my care of the patient were reviewed by me and considered in my medical decision making (see chart for details).    Patient is medically stable this time.  He is not appear to be a threat to himself or others at this time.  The patient's mother is with him.  Mother feels comfortable taking the patient home tonight and [facility].  I do not think he needs acute psychiatric hospitalization.  Mother is well aware that he needs to be reinitiated on his antipsychotic medications and needs follow-up with his psychiatry team.  He is reestablishing this tomorrow.  No active bleeding at this time.  Likely mild upper respiratory tract infection.  Vital signs are stable.  No indication for IV fluids.  Patient tolerating  fluids.  Final Clinical Impressions(s) / ED Diagnoses   Final diagnoses:  Epistaxis    ED Discharge Orders    None       Azalia Bilisampos, Tori Dattilio, MD 03/14/18 [id number]

## 2018-04-14 ENCOUNTER — Emergency Department (HOSPITAL_COMMUNITY)
Admission: EM | Admit: 2018-04-14 | Discharge: 2018-04-15 | Disposition: A | Discharge status: Other Acute Inpatient Hospital | Payer: Medicaid Other | Attending: Emergency Medicine | Admitting: Emergency Medicine

## 2018-04-14 ENCOUNTER — Encounter (HOSPITAL_COMMUNITY): Payer: Self-pay | Admitting: Emergency Medicine

## 2018-04-14 DIAGNOSIS — R460 Very low level of personal hygiene: Secondary | ICD-10-CM | POA: Insufficient documentation

## 2018-04-14 DIAGNOSIS — F17200 Nicotine dependence, unspecified, uncomplicated: Secondary | ICD-10-CM | POA: Insufficient documentation

## 2018-04-14 DIAGNOSIS — F200 Paranoid schizophrenia: Secondary | ICD-10-CM | POA: Diagnosis present

## 2018-04-14 DIAGNOSIS — Z91040 Latex allergy status: Secondary | ICD-10-CM | POA: Insufficient documentation

## 2018-04-14 DIAGNOSIS — F209 Schizophrenia, unspecified: Secondary | ICD-10-CM | POA: Insufficient documentation

## 2018-04-14 DIAGNOSIS — Z9114 Patient's other noncompliance with medication regimen: Secondary | ICD-10-CM | POA: Insufficient documentation

## 2018-04-14 DIAGNOSIS — Z008 Encounter for other general examination: Secondary | ICD-10-CM

## 2018-04-14 DIAGNOSIS — Z046 Encounter for general psychiatric examination, requested by authority: Secondary | ICD-10-CM | POA: Insufficient documentation

## 2018-04-14 DIAGNOSIS — Z79899 Other long term (current) drug therapy: Secondary | ICD-10-CM | POA: Insufficient documentation

## 2018-04-14 DIAGNOSIS — F329 Major depressive disorder, single episode, unspecified: Secondary | ICD-10-CM | POA: Insufficient documentation

## 2018-04-14 DIAGNOSIS — Z91010 Allergy to peanuts: Secondary | ICD-10-CM | POA: Insufficient documentation

## 2018-04-14 LAB — COMPREHENSIVE METABOLIC PANEL
ALT: 19 U/L (ref 0–44)
AST: 26 U/L (ref 15–41)
Albumin: 4.4 g/dL (ref 3.5–5.0)
Alkaline Phosphatase: 42 U/L (ref 38–126)
Anion gap: 10 (ref 5–15)
BUN: 18 mg/dL (ref 6–20)
CHLORIDE: 105 mmol/L (ref 98–111)
CO2: 25 mmol/L (ref 22–32)
CREATININE: 1.11 mg/dL (ref 0.61–1.24)
Calcium: 9.1 mg/dL (ref 8.9–10.3)
GFR calc non Af Amer: >60 mL/min (ref >60)
Glucose, Bld: 118 mg/dL — ABNORMAL HIGH (ref 70–99)
POTASSIUM: 3.5 mmol/L (ref 3.5–5.1)
SODIUM: 140 mmol/L (ref 135–145)
Total Bilirubin: 0.5 mg/dL (ref 0.3–1.2)
Total Protein: 7.5 g/dL (ref 6.5–8.1)

## 2018-04-14 LAB — SALICYLATE LEVEL

## 2018-04-14 LAB — CBC WITH DIFFERENTIAL/PLATELET
ABS IMMATURE GRANULOCYTES: 0.02 10*3/uL (ref 0.00–0.07)
BASOS PCT: 1 %
Basophils Absolute: 0 10*3/uL (ref 0.0–0.1)
EOS ABS: 0 10*3/uL (ref 0.0–0.5)
Eosinophils Relative: 1 %
HCT: 37.8 % — ABNORMAL LOW (ref 39.0–52.0)
Hemoglobin: 12.2 g/dL — ABNORMAL LOW (ref 13.0–17.0)
IMMATURE GRANULOCYTES: 0 %
Lymphocytes Relative: 29 %
Lymphs Abs: 1.5 10*3/uL (ref 0.7–4.0)
MCH: 29.8 pg (ref 26.0–34.0)
MCHC: 32.3 g/dL (ref 30.0–36.0)
MCV: 92.2 fL (ref 80.0–100.0)
Monocytes Absolute: 0.5 10*3/uL (ref 0.1–1.0)
Monocytes Relative: 10 %
NEUTROS ABS: 3.1 10*3/uL (ref 1.7–7.7)
NEUTROS PCT: 59 %
NRBC: 0 % (ref 0.0–0.2)
PLATELETS: 192 10*3/uL (ref 150–400)
RBC: 4.1 MIL/uL — AB (ref 4.22–5.81)
RDW: 13.1 % (ref 11.5–15.5)
WBC: 5.2 10*3/uL (ref 4.0–10.5)

## 2018-04-14 LAB — ETHANOL: Alcohol, Ethyl (B): <10 mg/dL (ref <10)

## 2018-04-14 LAB — ACETAMINOPHEN LEVEL: Acetaminophen (Tylenol), Serum: <10 ug/mL — ABNORMAL LOW (ref 10–30)

## 2018-04-14 MED ORDER — NICOTINE 21 MG/24HR TD PT24
21.0000 mg | MEDICATED_PATCH | Freq: Every day | TRANSDERMAL | Status: DC
  Administered 2018-04-14: 21 mg via TRANSDERMAL
  Filled 2018-04-14: qty 1

## 2018-04-14 MED ORDER — ONDANSETRON HCL 4 MG PO TABS: 4.0000 mg | ORAL_TABLET | Freq: Three times a day (TID) | ORAL | Status: DC | PRN

## 2018-04-14 MED ORDER — ZOLPIDEM TARTRATE 5 MG PO TABS: 5.0000 mg | ORAL_TABLET | Freq: Every evening | ORAL | Status: DC | PRN

## 2018-04-14 MED ORDER — LORAZEPAM 1 MG PO TABS: 1.0000 mg | ORAL_TABLET | Freq: Four times a day (QID) | ORAL | Status: DC | PRN

## 2018-04-14 MED ORDER — LORAZEPAM 1 MG PO TABS
1.0000 mg | ORAL_TABLET | Freq: Once | ORAL | Status: AC
  Administered 2018-04-14: 1 mg via ORAL
  Filled 2018-04-14: qty 1

## 2018-04-14 MED ORDER — TRAZODONE HCL 100 MG PO TABS
100.0000 mg | ORAL_TABLET | Freq: Every day | ORAL | Status: DC
  Administered 2018-04-14: 100 mg via ORAL
  Filled 2018-04-14: qty 1

## 2018-04-14 MED ORDER — ARIPIPRAZOLE 5 MG PO TABS
15.0000 mg | ORAL_TABLET | Freq: Every day | ORAL | Status: DC
  Administered 2018-04-14: 15 mg via ORAL
  Filled 2018-04-14: qty 1

## 2018-04-14 MED ORDER — ALUM & MAG HYDROXIDE-SIMETH 200-200-20 MG/5ML PO SUSP: 30.0000 mL | Freq: Four times a day (QID) | ORAL | Status: DC | PRN

## 2018-04-14 MED ORDER — TRAZODONE HCL 100 MG PO TABS: 100.0000 mg | ORAL_TABLET | Freq: Every day | ORAL | Status: DC

## 2018-04-14 MED ORDER — HYDROXYZINE HCL 25 MG PO TABS: 25.0000 mg | ORAL_TABLET | Freq: Three times a day (TID) | ORAL | Status: DC | PRN

## 2018-04-14 MED ORDER — ACETAMINOPHEN 325 MG PO TABS: 650.0000 mg | ORAL_TABLET | ORAL | Status: DC | PRN

## 2018-04-14 MED ORDER — DIVALPROEX SODIUM ER 500 MG PO TB24
750.0000 mg | ORAL_TABLET | Freq: Every day | ORAL | Status: DC
  Administered 2018-04-14: 750 mg via ORAL
  Filled 2018-04-14: qty 1

## 2018-04-14 NOTE — ED Notes (Signed)
Pt oriented to room and unit.  Pt is quiet and withdrawn but did say a few words to me.  Pt appears paranoid,  He will not eat his dinner tray but ate some graham crackers.   15 minute checks and video monitoring in place.

## 2018-04-14 NOTE — ED Triage Notes (Addendum)
Patient here via EMS, voluntary. Off meds for "months". Patient refuses to answer questions. When ask questions, patient stares at wall. When asked about SI/HI, patient again stares at wall. Will assume SI.

## 2018-04-14 NOTE — ED Provider Notes (Signed)
[facility]-EMERGENCY DEPT Provider Note   CSN: [id number] Arrival date & time: 04/14/18  1321     History   Chief Complaint Chief Complaint  Patient presents with  . Psychiatric Evaluation    HPI Carole Brisky is a 23 y.o. male with a PMHx of schizophrenia, who presents to the ED with complaints of psychiatric evaluation.  LEVEL 5 CAVEAT DUE TO PSYCHIATRIC CONDITION, most of the information was provided by his CST worker Roosevelt LocksJacquie Davis [phone]).  She states that he has been off his meds for the last 2 months, and has been in his current state for about a month.  She states that he pretty much stands in the corner in his room not talking and not caring for himself, having poor hygiene, and not wanting to interact with anyone.  He will occasionally answer yes and no questions but does not speak very much.  When asked whether he has any pain anywhere, he shakes his head no.  He will not answer any further questions and therefore his history and review of systems is severely limited.  His CST worker gives me his mother's information to call: Rodney Boozeasha [phone].  Addendum: more information obtained from his mother, who states that he has been depressed, not eating or responding much, he occasionally will have outbursts, she believes he may be hearing voices but she is not sure.  She states that the act team came out today and wanted him to be admitted for stabilization.  She endorses that he has not been taking his medications for 2 months. She denies any recent illnesses or other concerns at this time.   The history is provided by the patient and medical records. No language interpreter was used.    Past Medical History:  Diagnosis Date  . Schizophrenia, acute [facility](HCC)     Patient Active Problem List   Diagnosis Date Noted  . Aggression   . 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 Schizophrenia, paranoid type (HCC) 08/17/2015  . Hyperprolactinemia (HCC)     . Cannabis use disorder, severe, dependence (HCC) 11/26/2014    History reviewed. No pertinent surgical history.      Home Medications    Prior to Admission medications   Medication Sig Start Date End Date Taking? Authorizing Provider  ARIPiprazole (ABILIFY) 15 MG tablet Take 1 tablet (15 mg total) by mouth daily. For mood control 04/02/17   Armandina StammerNwoko, Agnes I, NP  ARIPiprazole ER 400 MG SRER Inject 400 mg into the muscle every 30 (thirty) days. (Due on 04-24-17): For mood control 04/24/17   Armandina StammerNwoko, Agnes I, NP  divalproex (DEPAKOTE ER) 250 MG 24 hr tablet Take 3 tablets (750 mg total) by mouth at bedtime. For mood stabilization 04/01/17   Armandina StammerNwoko, Agnes I, NP  hydrOXYzine (ATARAX/VISTARIL) 25 MG tablet Take 1 tablet (25 mg total) by mouth 3 (three) times daily as needed for anxiety. 04/01/17   Armandina StammerNwoko, Agnes I, NP  LORazepam (ATIVAN) 1 MG tablet Take 1 tablet (1 mg total) by mouth every 6 (six) hours as needed for anxiety. 04/01/17   Armandina StammerNwoko, Agnes I, NP  nicotine (NICODERM CQ - DOSED IN MG/24 HOURS) 14 mg/24hr patch Place 1 patch (14 mg total) onto the skin daily. (May purchase from over the counter): For smoking cessation 04/02/17   Armandina StammerNwoko, Agnes I, NP  traZODone (DESYREL) 100 MG tablet Take 1 tablet (100 mg total) by mouth at bedtime as needed for sleep. 04/01/17   Sanjuana KavaNwoko, Agnes I, NP  Family History Family History  Problem Relation Age of Onset  . Mental illness Cousin     Social History Social History   Tobacco Use  . Smoking status: Current Every Day Smoker    Packs/day: 0.00    Years: 4.00    Pack years: 0.00  . Smokeless tobacco: Never Used  Substance Use Topics  . Alcohol use: Yes    Comment: a little   . Drug use: No    Comment: denies     Allergies   Chocolate; Latex; Peanuts [peanut oil]; and Tape   Review of Systems Review of Systems  Unable to perform ROS: Psychiatric disorder  Allergic/Immunologic: Negative for immunocompromised state.   LEVEL 5 CAVEAT DUE TO  PSYCHIATRIC CONDITION  Physical Exam Updated Vital Signs BP 136/71 (BP Location: Left Arm)   Pulse (!) 103   Temp 98.3 F (36.8 C) (Oral)   Resp 19   SpO2 99%   Physical Exam Vitals signs and nursing note reviewed.  Constitutional:      General: He is not in acute distress.    Appearance: Normal appearance. He is well-developed. He is not toxic-appearing.     Comments: Afebrile, nontoxic, NAD, stares blankly forward, does not answer questions but will occasionally nod yes and no, obeys commands. Poor hygiene.   HENT:     Head: Normocephalic and atraumatic.  Eyes:     General:        Right eye: No discharge.        Left eye: No discharge.     Conjunctiva/sclera: Conjunctivae normal.  Neck:     Musculoskeletal: Normal range of motion and neck supple.  Cardiovascular:     Rate and Rhythm: Normal rate and regular rhythm.     Pulses: Normal pulses.     Heart sounds: Normal heart sounds, S1 normal and S2 normal. No murmur. No friction rub. No gallop.      Comments: No tachycardia on exam Pulmonary:     Effort: Pulmonary effort is normal. No respiratory distress.     Breath sounds: Normal breath sounds. No decreased breath sounds, wheezing, rhonchi or rales.  Abdominal:     General: Bowel sounds are normal. There is no distension.     Palpations: Abdomen is soft. Abdomen is not rigid.     Tenderness: There is no abdominal tenderness. There is no right CVA tenderness, left CVA tenderness, guarding or rebound. Negative signs include Murphy's sign and McBurney's sign.  Musculoskeletal: Normal range of motion.  Skin:    General: Skin is warm and dry.     Findings: No rash.  Neurological:     Mental Status: He is alert.     Sensory: Sensation is intact. No sensory deficit.     Motor: Motor function is intact.     Comments: Will not answer questions but obeys commands properly  Psychiatric:        Attention and Perception: He is inattentive.        Mood and Affect: Affect is  blunt.        Speech: He is noncommunicative.        Behavior: Behavior is withdrawn.     Comments: Withdrawn, noncommunicative, blunt affect, stares blankly forward and occasionally nods yes and no to questions but doesn't really answer questions verbally. Seems distracted at times.       ED Treatments / Results  Labs (all labs ordered are listed, but only abnormal results are displayed) Labs Reviewed  CBC  WITH DIFFERENTIAL/PLATELET - Abnormal; Notable for the following components:      Result Value   RBC 4.10 (*)    Hemoglobin 12.2 (*)    HCT 37.8 (*)    All other components within normal limits  COMPREHENSIVE METABOLIC PANEL - Abnormal; Notable for the following components:   Glucose, Bld 118 (*)    All other components within normal limits  ACETAMINOPHEN LEVEL - Abnormal; Notable for the following components:   Acetaminophen (Tylenol), Serum <10 (*)    All other components within normal limits  ETHANOL  SALICYLATE LEVEL  RAPID URINE DRUG SCREEN, HOSP PERFORMED    EKG None  Radiology No results found.  Procedures Procedures (including critical care time)  Medications Ordered in ED Medications  acetaminophen (TYLENOL) tablet 650 mg (has no administration in time range)  zolpidem (AMBIEN) tablet 5 mg (has no administration in time range)  ondansetron (ZOFRAN) tablet 4 mg (has no administration in time range)  alum & mag hydroxide-simeth (MAALOX/MYLANTA) 200-200-20 MG/5ML suspension 30 mL (has no administration in time range)  nicotine (NICODERM CQ - dosed in mg/24 hours) patch 21 mg (has no administration in time range)     Initial Impression / Assessment and Plan / ED Course  I have reviewed the triage vital signs and the nursing notes.  Pertinent labs & imaging results that were available during my care of the patient were reviewed by me and considered in my medical decision making (see chart for details).     23 y.o. male here for psychiatric evaluation.   Patient nonverbal, will not answer questions, just stares blankly forward.  His CST worker provides most of his information, states that he has been off his medications for the last 2 months, has not been taking care of himself and not being very interactive for the last month.  Attempted to call his mother who did not answer, message was left.  He denies having pain anywhere.  He is unable to answer any other questions although he follows commands well and does not appear altered.  Poor hygiene.  Suspect psychiatric condition.  We will get psych labs and attempt to get in touch with his mother for further clarification on what is going on.  Will reassess shortly.  3:39 PM Spoke with mother who reports similar things that the CST worker stated, reporting that he's depressed, not eating or responding, will occasionally have outbursts and may have auditory hallucinations hearing voices but she's not sure. She denies that he's had any other concerning symptoms or recent illnesses. He's not taking his meds x2 months, meds include abilify IM, haldol, and depakote. She plans on coming down here as soon as she can get a ride.  CBC w/diff with marginal anemia similar to prior labs, but overall unremarkable. CMP unremarkable. EtOH level undetectable. Salicylate and acetaminophen levels WNL. UDS pending, but does not interfere with med clearance. Pt medically cleared at this time. Psych hold orders placed; home med orders not placed since pt has been off his meds for 2 months; will defer to psych to reorder those. Please see TTS notes for further documentation of care/dispo. PLEASE NOTE THAT PT IS HERE VOLUNTARILY AT THIS TIME, IF PT TRIES TO LEAVE THEY WOULD NEED REASSESSMENT TO SEE IF IVC PAPERWORK NEEDS TO BE TAKEN OUT. Pt stable at time of med clearance.     Final Clinical Impressions(s) / ED Diagnoses   Final diagnoses:  Schizophrenia, unspecified type [facility])  Medical clearance for psychiatric admission  ED Discharge Orders    [address] [place], [place] 04/14/18 1539    Wynetta Fines, MD 04/14/18 [phone]

## 2018-04-14 NOTE — BH Assessment (Signed)
Assessment Note  [name] Love is an 23 y.o. male that presents this date voluntary. Patient is observed to be non-verbal and cannot participate in the assessment. Information to complete assessment is obtained from admission notes and previous history. Per notes, patient has a history of schizophrenia, who presents to the ED for a psychiatric evaluation. Information was provided by his CST worker [name] Love [phone]). She states that patient has been off his medications for the last 2 months, and has been in his current state for about a month.  She states that he pretty much stands in the corner in his room not talking and not caring for himself, having poor hygiene, and not wanting to interact with anyone. He will occasionally answer yes and no questions but does not speak. When asked whether he has any pain anywhere, he shakes his head no. He will not answer any further questions and therefore his history is severely limited.  His CST worker gives me his mother's information to call: [name] Love [phone]. Mother states patient has been depressed, not eating or responding much, he occasionally will have outbursts, she believes he may be hearing voices but she is not sure. She states that the ACT team came out today and wanted him to be admitted for stabilization. She endorses that he has not been taking his medications for 2 months. She denies any recent illnesses or other concerns at this time. Per note review patient was last seen at [facility] on 04/01/17 when he presented at that time with IVC. IVC paperwork indicated on that date (04/01/17) that patient has not been compliant with his medications and is exhibiting increasingly aggressive behavior towards family members. Case was staffed with [name] Love who recommended a inpatient admission to assist with stabilization.      Diagnosis: Schizophrenia (F20.9)   Past Medical History:  Past Medical History:  Diagnosis Date  . Schizophrenia, acute (HCC)      History reviewed. No pertinent surgical history.  Family History:  Family History  Problem Relation Age of Onset  . Mental illness Cousin     Social History:  reports that he has been smoking. He has been smoking about 0.00 packs per day for the past 4.00 years. He has never used smokeless tobacco. He reports current alcohol use. He reports that he does not use drugs.  Additional Social History:  Alcohol / Drug Use Pain Medications: see PTA list Prescriptions: see PTA list Over the Counter: see PTA list History of alcohol / drug use?: No history of alcohol / drug abuse Longest period of sobriety (when/how long): NA Negative Consequences of Use: (UTA) Withdrawal Symptoms: (UTA)  CIWA: CIWA-Ar BP: 136/71 Pulse Rate: (!) 103 COWS:    Allergies:  Allergies  Allergen Reactions  . Chocolate Rash    unknown  . Latex Rash    Condoms only  . Peanuts [Peanut Oil] Rash    All Nuts  . Tape Rash    Home Medications: (Not in a hospital admission)   OB/GYN Status:  No LMP for male patient.  General Assessment Data Location of Assessment: [facility] TTS Assessment: In system Is this a Tele or Face-to-Face Assessment?: Face-to-Face Is this an Initial Assessment or a Re-assessment for this encounter?: Initial Assessment Patient Accompanied by:: (NA) Language Other than English: No Living Arrangements: Other (Comment) What gender do you identify as?: Male Marital status: Single Living Arrangements: Parent Can pt return to current living arrangement?: Yes Admission Status: Voluntary Is patient capable of signing  voluntary admission?: Yes Referral Source: Self/Family/Friend Insurance type: Medicaid     Crisis Care Plan Living Arrangements: Parent Legal Guardian: (NA) Name of Psychiatrist: Evans Love(Per notes) Name of Therapist: None  Education Status Is patient currently in school?: No Is the patient employed, unemployed or receiving disability?: Unemployed  Risk  to self with the past 6 months Suicidal Ideation: No Has patient been a risk to self within the past 6 months prior to admission? : No Suicidal Intent: No Has patient had any suicidal intent within the past 6 months prior to admission? : No Is patient at risk for suicide?: No, but patient needs Medical Clearance Suicidal Plan?: No Has patient had any suicidal plan within the past 6 months prior to admission? : No Access to Means: No What has been your use of drugs/alcohol within the last 12 months?: NA Previous Attempts/Gestures: No How many times?: 0 Other Self Harm Risks: (Off medications) Triggers for Past Attempts: Unknown Intentional Self Injurious Behavior: None Family Suicide History: No Recent stressful life event(s): (Unk) Persecutory voices/beliefs?: [name] Love(UTA) Depression: (UTA) Depression Symptoms: (UTA) Substance abuse history and/or treatment for substance abuse?: No Suicide prevention information given to non-admitted patients: Not applicable  Risk to Others within the past 6 months Homicidal Ideation: No Does patient have any lifetime risk of violence toward others beyond the six months prior to admission? : No Thoughts of Harm to Others: No Current Homicidal Intent: No Current Homicidal Plan: No Access to Homicidal Means: No Identified Victim: NA History of harm to others?: No Assessment of Violence: None Noted Violent Behavior Description: NA Does patient have access to weapons?: No Criminal Charges Pending?: No Does patient have a court date: No Is patient on probation?: No  Psychosis Hallucinations: (UTA) Delusions: (UTA)  Mental Status Report Appearance/Hygiene: In scrubs Eye Contact: Poor Motor Activity: Freedom of movement Speech: Unable to assess Level of Consciousness: Quiet/awake Mood: Depressed Affect: Flat Anxiety Level: None Thought Processes: Unable to Assess Judgement: Unable to Assess Orientation: Unable to assess Obsessive Compulsive  Thoughts/Behaviors: Unable to Assess  Cognitive Functioning Concentration: Unable to Assess Memory: Unable to Assess Is patient IDD: No Insight: Unable to Assess Impulse Control: Unable to Assess Appetite: (UTA) Have you had any weight changes? : (UTA) Sleep: (UTA) Total Hours of Sleep: (UTA) Vegetative Symptoms: (UTA)  ADLScreening [facility] Assessment Services) Patient's cognitive ability adequate to safely complete daily activities?: Yes Patient able to express need for assistance with ADLs?: Yes Independently performs ADLs?: Yes (appropriate for developmental age)  Prior Inpatient Therapy Prior Inpatient Therapy: Yes Prior Therapy Dates: 2018 [facility] Reason for Treatment: MH issues  Prior Outpatient Therapy Prior Outpatient Therapy: Yes Prior Therapy Dates: Ongoing Prior Therapy Facilty/Provider(s): [facility] Reason for Treatment: Med mang Does patient have an ACCT team?: Yes Does patient have Intensive In-House Services?  : No Does patient have [facility] services? : No Does patient have P4CC services?: No  ADL Screening (condition at time of admission) Patient's cognitive ability adequate to safely complete daily activities?: Yes Is the patient deaf or have difficulty hearing?: No Does the patient have difficulty seeing, even when wearing glasses/contacts?: No Does the patient have difficulty concentrating, remembering, or making decisions?: Yes Patient able to express need for assistance with ADLs?: Yes Does the patient have difficulty dressing or bathing?: No Independently performs ADLs?: Yes (appropriate for developmental age) Does the patient have difficulty walking or climbing stairs?: No Weakness of Legs: None Weakness of Arms/Hands: None  Home Assistive Devices/Equipment Home  Assistive Devices/Equipment: None  Therapy Consults (therapy consults require a physician order) PT Evaluation Needed: No OT Evalulation Needed: No SLP  Evaluation Needed: No Abuse/Neglect Assessment (Assessment to be complete while patient is alone) Physical Abuse: Denies Verbal Abuse: Denies Sexual Abuse: Denies Exploitation of patient/patient's resources: Denies Self-Neglect: Denies Values / Beliefs Cultural Requests During Hospitalization: None Spiritual Requests During Hospitalization: None Consults Spiritual Care Consult Needed: No Social Work Consult Needed: No Merchant navy officerAdvance Directives (For Healthcare) Does Patient Have a Medical Advance Directive?: No Would patient like information on creating a medical advance directive?: No - Patient declined          Disposition: Case was staffed with [name] CareParks Love who recommended a inpatient admission to assist with stabilization.     Disposition Initial Assessment Completed for this Encounter: Yes Disposition of Patient: Admit Type of inpatient treatment program: Adult Patient refused recommended treatment: No Mode of transportation if patient is discharged/movement?: (Unk)  On Site Evaluation by:   Reviewed with Physician:    Alfredia Fergusonavid L Ulices Maack 04/14/2018 4:44 PM

## 2018-04-14 NOTE — ED Notes (Signed)
Shower given to patient. Refused initially, urged patient in shower. Patient refuses to eat or drink, looks at food as if paranoid.

## 2018-04-14 NOTE — ED Notes (Signed)
Patient refused

## 2018-04-14 NOTE — ED Notes (Signed)
Pt awake, alert & responsive, no distress noted, calm at present, resting in bed at present.  Monitoring for safety, Q 15 min checks in effect.

## 2018-04-14 NOTE — BH Assessment (Signed)
[facility] Assessment Progress Note  Case was staffed with Parks FNP who recommended a inpatient admission to assist with stabilization.     

## 2018-04-15 ENCOUNTER — Inpatient Hospital Stay (HOSPITAL_COMMUNITY)
Admission: AD | Admit: 2018-04-15 | Discharge: 2018-04-21 | DRG: 885 | Disposition: A | Discharge status: Home / Self Care | Payer: Medicaid Other | Attending: Psychiatry | Admitting: Psychiatry

## 2018-04-15 ENCOUNTER — Inpatient Hospital Stay (HOSPITAL_COMMUNITY): Admission: AD | Admit: 2018-04-15 | Payer: Medicaid Other | Source: Intra-hospital | Admitting: Psychiatry

## 2018-04-15 ENCOUNTER — Other Ambulatory Visit: Payer: Self-pay

## 2018-04-15 ENCOUNTER — Encounter (HOSPITAL_COMMUNITY): Payer: Self-pay

## 2018-04-15 DIAGNOSIS — F23 Brief psychotic disorder: Secondary | ICD-10-CM | POA: Diagnosis present

## 2018-04-15 DIAGNOSIS — F1220 Cannabis dependence, uncomplicated: Secondary | ICD-10-CM | POA: Diagnosis present

## 2018-04-15 DIAGNOSIS — Z9114 Patient's other noncompliance with medication regimen: Secondary | ICD-10-CM

## 2018-04-15 DIAGNOSIS — F329 Major depressive disorder, single episode, unspecified: Secondary | ICD-10-CM | POA: Diagnosis present

## 2018-04-15 DIAGNOSIS — G4700 Insomnia, unspecified: Secondary | ICD-10-CM | POA: Diagnosis present

## 2018-04-15 DIAGNOSIS — F419 Anxiety disorder, unspecified: Secondary | ICD-10-CM | POA: Diagnosis present

## 2018-04-15 DIAGNOSIS — R4701 Aphasia: Secondary | ICD-10-CM | POA: Diagnosis present

## 2018-04-15 DIAGNOSIS — R4183 Borderline intellectual functioning: Secondary | ICD-10-CM | POA: Diagnosis present

## 2018-04-15 DIAGNOSIS — Z79899 Other long term (current) drug therapy: Secondary | ICD-10-CM

## 2018-04-15 DIAGNOSIS — F200 Paranoid schizophrenia: Principal | ICD-10-CM | POA: Diagnosis present

## 2018-04-15 DIAGNOSIS — F17200 Nicotine dependence, unspecified, uncomplicated: Secondary | ICD-10-CM | POA: Diagnosis present

## 2018-04-15 DIAGNOSIS — F209 Schizophrenia, unspecified: Secondary | ICD-10-CM | POA: Diagnosis not present

## 2018-04-15 MED ORDER — ENSURE ENLIVE PO LIQD
237.0000 mL | Freq: Two times a day (BID) | ORAL | Status: DC
  Administered 2018-04-16 – 2018-04-21 (×8): 237 mL via ORAL

## 2018-04-15 MED ORDER — DIVALPROEX SODIUM ER 500 MG PO TB24: 500.0000 mg | ORAL_TABLET | Freq: Two times a day (BID) | ORAL | Status: DC

## 2018-04-15 MED ORDER — TRAZODONE HCL 50 MG PO TABS
50.0000 mg | ORAL_TABLET | Freq: Every evening | ORAL | Status: DC | PRN
  Administered 2018-04-15: 50 mg via ORAL
  Filled 2018-04-15 (×5): qty 1

## 2018-04-15 MED ORDER — ARIPIPRAZOLE ER 400 MG IM SRER
400.0000 mg | INTRAMUSCULAR | Status: DC
  Administered 2018-04-15: 400 mg via INTRAMUSCULAR
  Filled 2018-04-15: qty 2

## 2018-04-15 MED ORDER — ACETAMINOPHEN 325 MG PO TABS
650.0000 mg | ORAL_TABLET | Freq: Four times a day (QID) | ORAL | Status: DC | PRN
  Administered 2018-04-16 – 2018-04-20 (×3): 650 mg via ORAL
  Filled 2018-04-15 (×3): qty 2

## 2018-04-15 MED ORDER — ALUM & MAG HYDROXIDE-SIMETH 200-200-20 MG/5ML PO SUSP: 30.0000 mL | ORAL | Status: DC | PRN

## 2018-04-15 MED ORDER — MAGNESIUM HYDROXIDE 400 MG/5ML PO SUSP: 30.0000 mL | Freq: Every day | ORAL | Status: DC | PRN

## 2018-04-15 NOTE — ED Notes (Signed)
Pt is calm and cooperative but appears to be thought blocking.   Pt appears paranoid and confused.  He knows he is in the hospital but does not seem to know how to shower.  He has very poor hygiene and we have tried multiple times to get him to shower.  Pt denies S/I, H/I, and AVH.  Pt is eating and drinking and compliant with his medication.

## 2018-04-15 NOTE — Consult Note (Addendum)
[facility]   Reason for Consult:  Paranoia and catatonia Referring Physician:  EDP Patient Identification: [name] Love MRN:  [id number] Principal Diagnosis: Schizophrenia, paranoid type (HCC) Diagnosis:  Principal Problem:   Schizophrenia, paranoid type (HCC)   Total Time spent with patient: 30 minutes  Subjective:   [name] Love is a 23 y.o. male patient admitted with paranoid schizophrenia and off his medications for 2 months.  HPI:  Pt was seen and chart reviewed with treatment team and Dr [name]. Pt is standing in his room with a blank stare. He is able to whisper "yes" in answer to a question but is extremely paranoid. He does not engage with the treatment team. He lives at home with his mother and frequently will stop taking his medications for schizophrenia and become paranoid and catatonic. His CST worker Roosevelt LocksJacquie Davis [phone], provided information yesterday that he has been in his current state for one month and been off his medications for two months. He stands in the corner of his room and will not eat or take care of his personal hygiene. Pt has taken a shower here in the emergency room with much encouragement. Pt will be recommended for inpatient psychiatric admission.   Past Psychiatric History: As above  Risk to Self: Suicidal Ideation: No Suicidal Intent: No Is patient at risk for suicide?: No, but patient needs Medical Clearance Suicidal Plan?: No Access to Means: No What has been your use of drugs/alcohol within the last 12 months?: NA How many times?: 0 Other Self Harm Risks: (Off medications) Triggers for Past Attempts: Unknown Intentional Self Injurious Behavior: None Risk to Others: Homicidal Ideation: No Thoughts of Harm to Others: No Current Homicidal Intent: No Current Homicidal Plan: No Access to Homicidal Means: No Identified Victim: NA History of harm to others?: No Assessment of Violence: None Noted Violent Behavior  Description: NA Does patient have access to weapons?: No Criminal Charges Pending?: No Does patient have a court date: No Prior Inpatient Therapy: Prior Inpatient Therapy: Yes Prior Therapy Dates: 2018 [facility] Reason for Treatment: MH issues Prior Outpatient Therapy: Prior Outpatient Therapy: Yes Prior Therapy Dates: Ongoing Prior Therapy Facilty/Provider(s): [facility] Reason for Treatment: Med mang Does patient have an ACCT team?: Yes Does patient have Intensive In-House Services?  : No Does patient have [facility] services? : No Does patient have P4CC services?: No  Past Medical History:  Past Medical History:  Diagnosis Date  . Schizophrenia, acute (HCC)    History reviewed. No pertinent surgical history. Family History:  Family History  Problem Relation Age of Onset  . Mental illness Cousin    Family Psychiatric  History: Unknown, Pt unable to give this information.  Social History:  Social History   Substance and Sexual Activity  Alcohol Use Yes   Comment: a little      Social History   Substance and Sexual Activity  Drug Use No   Comment: denies    Social History   Socioeconomic History  . Marital status: Single    Spouse name: Not on file  . Number of children: Not on file  . Years of education: Not on file  . Highest education level: Not on file  Occupational History  . Not on file  Social Needs  . Financial resource strain: Not on file  . Food insecurity:    Worry: Not on file    Inability: Not on file  . Transportation needs:    Medical: Not on  file    Non-medical: Not on file  Tobacco Use  . Smoking status: Current Every Day Smoker    Packs/day: 0.00    Years: 4.00    Pack years: 0.00  . Smokeless tobacco: Never Used  Substance and Sexual Activity  . Alcohol use: Yes    Comment: a little   . Drug use: No    Comment: denies  . Sexual activity: Not Currently  Lifestyle  . Physical activity:    Days per  week: Not on file    Minutes per session: Not on file  . Stress: Not on file  Relationships  . Social connections:    Talks on phone: Not on file    Gets together: Not on file    Attends religious service: Not on file    Active member of club or organization: Not on file    Attends meetings of clubs or organizations: Not on file    Relationship status: Not on file  Other Topics Concern  . Not on file  Social History Narrative  . Not on file   Additional Social History:    Allergies:   Allergies  Allergen Reactions  . Chocolate Rash    unknown  . Latex Rash    Condoms only  . Peanuts [Peanut Oil] Rash    All Nuts  . Tape Rash    Labs:  Results for orders placed or performed during the hospital encounter of 04/14/18 (from the past 48 hour(s))  CBC with Differential/Platelet     Status: Abnormal   Collection Time: 04/14/18  2:25 PM  Result Value Ref Range   WBC 5.2 4.0 - 10.5 K/uL   RBC 4.10 (L) 4.22 - 5.81 MIL/uL   Hemoglobin 12.2 (L) 13.0 - 17.0 g/dL   HCT 08.637.8 (L) 57.839.0 - 46.952.0 %   MCV 92.2 80.0 - 100.0 fL   MCH 29.8 26.0 - 34.0 pg   MCHC 32.3 30.0 - 36.0 g/dL   RDW 62.913.1 52.811.5 - 41.315.5 %   Platelets 192 150 - 400 K/uL   nRBC 0.0 0.0 - 0.2 %   Neutrophils Relative % 59 %   Neutro Abs 3.1 1.7 - 7.7 K/uL   Lymphocytes Relative 29 %   Lymphs Abs 1.5 0.7 - 4.0 K/uL   Monocytes Relative 10 %   Monocytes Absolute 0.5 0.1 - 1.0 K/uL   Eosinophils Relative 1 %   Eosinophils Absolute 0.0 0.0 - 0.5 K/uL   Basophils Relative 1 %   Basophils Absolute 0.0 0.0 - 0.1 K/uL   Immature Granulocytes 0 %   Abs Immature Granulocytes 0.02 0.00 - 0.07 K/uL    Comment: Performed at [facility], [address] [place], [place] [address]  Comprehensive metabolic panel     Status: Abnormal   Collection Time: 04/14/18  2:25 PM  Result Value Ref Range   Sodium 140 135 - 145 mmol/L   Potassium 3.5 3.5 - 5.1 mmol/L   Chloride 105 98 - 111 mmol/L   CO2 25 22 - 32  mmol/L   Glucose, Bld 118 (H) 70 - 99 mg/dL   BUN 18 6 - 20 mg/dL   Creatinine, Ser 0.271.11 0.61 - 1.24 mg/dL   Calcium 9.1 8.9 - 25.310.3 mg/dL   Total Protein 7.5 6.5 - 8.1 g/dL   Albumin 4.4 3.5 - 5.0 g/dL   AST 26 15 - 41 U/L   ALT 19 0 - 44 U/L   Alkaline Phosphatase 42  38 - 126 U/L   Total Bilirubin 0.5 0.3 - 1.2 mg/dL   GFR calc non Af Amer >60 >60 mL/min   GFR calc Af Amer >60 >60 mL/min   Anion gap 10 5 - 15    Comment: Performed at [facility], [address], [place], [place] [address]  Ethanol     Status: None   Collection Time: 04/14/18  2:25 PM  Result Value Ref Range   Alcohol, Ethyl (B) <10 <10 mg/dL    Comment: (NOTE) Lowest detectable limit for serum alcohol is 10 mg/dL. For medical purposes only. Performed at [facility], [address], [place], [place] [address]   Salicylate level     Status: None   Collection Time: 04/14/18  2:25 PM  Result Value Ref Range   Salicylate Lvl <7.0 2.8 - 30.0 mg/dL    Comment: Performed at [facility], [address], [place], [place] [address]  Acetaminophen level     Status: Abnormal   Collection Time: 04/14/18  2:25 PM  Result Value Ref Range   Acetaminophen (Tylenol), Serum <10 (L) 10 - 30 ug/mL    Comment: (NOTE) Therapeutic concentrations vary significantly. A range of 10-30 ug/mL  may be an effective concentration for many patients. However, some  are Devora treated at concentrations outside of this range. Acetaminophen concentrations >150 ug/mL at 4 hours after ingestion  and >50 ug/mL at 12 hours after ingestion are often associated with  toxic reactions. Performed at [facility], [address], [place], [place] [address]     Current Facility-Administered Medications  Medication Dose Route Frequency Provider Last Rate Last Dose  . acetaminophen (TYLENOL) tablet 650 mg  650 mg Oral Q4H PRN Street, [place], [place]      . alum & mag  hydroxide-simeth (MAALOX/MYLANTA) 200-200-20 MG/5ML suspension 30 mL  30 mL Oral Q6H PRN Street, [place], [place]      . ARIPiprazole (ABILIFY) tablet 15 mg  15 mg Oral QHS Laveda Abbe, NP   15 mg at 04/14/18 2155  . ARIPiprazole ER (ABILIFY MAINTENA) injection 400 mg  400 mg Intramuscular Q28 days Asante Blanda, MD      . divalproex (DEPAKOTE ER) 24 hr tablet 500 mg  500 mg Oral BID Garrick Midgley, MD      . hydrOXYzine (ATARAX/VISTARIL) tablet 25 mg  25 mg Oral TID PRN Laveda Abbe, NP      . nicotine (NICODERM CQ - dosed in mg/24 hours) patch 21 mg  21 mg Transdermal Daily [address] [place], PA-C   21 mg at 04/14/18 1655  . ondansetron (ZOFRAN) tablet 4 mg  4 mg Oral Q8H PRN Street, [place], [place]      . traZODone (DESYREL) tablet 100 mg  100 mg Oral QHS Laveda Abbe, NP   100 mg at 04/14/18 2154   Current Outpatient Medications  Medication Sig Dispense Refill  . ARIPiprazole (ABILIFY) 15 MG tablet Take 1 tablet (15 mg total) by mouth daily. For mood control (Patient not taking: Reported on 04/14/2018) 15 tablet 0  . ARIPiprazole ER 400 MG SRER Inject 400 mg into the muscle every 30 (thirty) days. (Due on 04-24-17): For mood control (Patient not taking: Reported on 04/14/2018) 1 each 0  . divalproex (DEPAKOTE ER) 250 MG 24 hr tablet Take 3 tablets (750 mg total) by mouth at bedtime. For mood stabilization (Patient not taking: Reported on 04/14/2018) 90 tablet 0  . hydrOXYzine (ATARAX/VISTARIL)  25 MG tablet Take 1 tablet (25 mg total) by mouth 3 (three) times daily as needed for anxiety. (Patient not taking: Reported on 04/14/2018) 90 tablet 0  . LORazepam (ATIVAN) 1 MG tablet Take 1 tablet (1 mg total) by mouth every 6 (six) hours as needed for anxiety. (Patient not taking: Reported on 04/14/2018) 30 tablet 0  . nicotine (NICODERM CQ - DOSED IN MG/24 HOURS) 14 mg/24hr patch Place 1 patch (14 mg total) onto the skin daily. (May purchase from over the counter): For smoking  cessation (Patient not taking: Reported on 04/14/2018) 28 patch 0  . traZODone (DESYREL) 100 MG tablet Take 1 tablet (100 mg total) by mouth at bedtime as needed for sleep. (Patient not taking: Reported on 04/14/2018) 30 tablet 0    Musculoskeletal: Strength & Muscle Tone: within normal limits Gait & Station: normal Patient leans: N/A  Psychiatric Specialty Exam: Physical Exam  Constitutional: He appears well-developed and well-nourished.  HENT:  Head: Normocephalic.  Respiratory: Effort normal.  Musculoskeletal: Normal range of motion.  Psychiatric: His affect is blunt. He is withdrawn. Thought content is paranoid. Cognition and memory are impaired (due to paranoia).    ROS  Blood pressure 136/71, pulse (!) 103, temperature 98.3 F (36.8 C), temperature source Oral, resp. rate 19, SpO2 99 %.There is no height or weight on file to calculate BMI.  General Appearance: Casual  Eye Contact:  stares straight ahead, poor eye contact  Speech:  Pt is non-communicative  Volume:  when he does talk it is in whispers  Mood:  Pt is catatonic  Affect:  Congruent and Constricted  Thought Process:  Disorganized  Orientation:  Other:  person  Thought Content:  Ideas of Reference:   Paranoia  Suicidal Thoughts:  UTA, pt is paranoid and catatonic  Homicidal Thoughts:  UTA, pt is paranoid and catatonic  Memory:  Immediate;   UTA, pt is paranoid and catatonic Recent;   UTA, pt is paranoid and catatonic Remote;   UTA, pt is paranoid and catatonic  Judgement:  Impaired  Insight:  UTA, pt is paranoid and catatonic  Psychomotor Activity:  Decreased  Concentration:  Concentration: UTA, pt is paranoid and catatonic and Attention Span: UTA, pt is paranoid and catatonic  Recall:  UTA, pt is paranoid and catatonic  Fund of Knowledge:  UTA, pt is paranoid and catatonic  Language:  pt speaks in yes or no whispers  Akathisia:  No  Handed:  Right  AIMS (if indicated):     Assets:  Nature conservation officer Housing  ADL's:  Intact  Cognition:  Impaired,  Severe due to paranoid schizophrenia    Sleep:   Poor    Treatment Plan Summary: Daily contact with patient to assess and evaluate symptoms and progress in treatment and Medication management  Disposition: Recommend psychiatric Inpatient admission when medically cleared. TTS to seek placement  Laveda Abbe, NP 04/15/2018 1:23 PM  Patient seen face-to-face for psychiatric evaluation, chart reviewed and case discussed with the physician extender and developed treatment plan. Reviewed the information documented and agree with the treatment plan. [name], MD

## 2018-04-15 NOTE — ED Notes (Signed)
Pt discharged safely with [facility].  Pt was calm and cooperative.  All belongings were sent with patient. 

## 2018-04-15 NOTE — Progress Notes (Signed)
Psychiatric team directed SW to prepare IVC paperwork which was completed and signed by the psychiatric EPD and admin notarized paperwork, and faxed in to the magistrate for pt to be served.  Please reconsult if future social work needs arise.  CSW signing off, as social work intervention is no longer needed.  Dorothe PeaJonathan F. Karna Abed, LCSW, LCAS, CSI Clinical Social Worker Ph: [phone]

## 2018-04-15 NOTE — Tx Team (Signed)
Initial Treatment Plan 04/15/2018 7:08 PM [name] Love HRC:[id number]    PATIENT STRESSORS: Other: UTA   PATIENT STRENGTHS: Physical Health Supportive family/friends   PATIENT IDENTIFIED PROBLEMS:        UTA / paranoid and catatonic                 DISCHARGE CRITERIA:  Improved stabilization in mood, thinking, and/or behavior Reduction of life-threatening or endangering symptoms to within safe limits Verbal commitment to aftercare and medication compliance  PRELIMINARY DISCHARGE PLAN: Outpatient therapy Return to previous living arrangement  PATIENT/FAMILY INVOLVEMENT: This treatment plan has been presented to and reviewed with the patient, [name] Love, The patient has been given the opportunity to ask questions and make suggestions.  Shela Nevin, RN 04/15/2018, 7:08 PM

## 2018-04-15 NOTE — Progress Notes (Signed)
D: Pt very confused on the unit, pt appeared to have thought blocking at times and a hard time processing information. Pt visible in dayroom much of the evening.   A: Pt was offered support and encouragement. Pt was given scheduled medications. Pt was encourage to attend groups. Q 15 minute checks were done for safety. EKG done placed on chart  R: safety maintained on unit.

## 2018-04-15 NOTE — Progress Notes (Signed)
Patient is a 23 year old male admitted from [facility] under IVC with paranoid schizophrenia and had reportedly been off his meds x 2 months- having become paranoid and catatonic- not eating well and poor personal hygiene.Marland Kitchen Pt presents with thought blocking at time of admission interview- unable to answer most questions- occasionally responding in whispers. Pt's VSS. Unable to sign admission paperwork. Skin search revealed no skin abnormalities. Belongings searched, revealing no contraband, Q 15 min checks initiated for safety

## 2018-04-16 DIAGNOSIS — F209 Schizophrenia, unspecified: Secondary | ICD-10-CM

## 2018-04-16 LAB — VALPROIC ACID LEVEL: Valproic Acid Lvl: 25 ug/mL — ABNORMAL LOW (ref 50.0–100.0)

## 2018-04-16 MED ORDER — ARIPIPRAZOLE 10 MG PO TABS
10.0000 mg | ORAL_TABLET | Freq: Every day | ORAL | Status: DC
  Administered 2018-04-16: 10 mg via ORAL
  Filled 2018-04-16 (×3): qty 1

## 2018-04-16 MED ORDER — DIVALPROEX SODIUM 250 MG PO DR TAB
750.0000 mg | DELAYED_RELEASE_TABLET | Freq: Every day | ORAL | Status: DC
  Administered 2018-04-16 – 2018-04-20 (×5): 750 mg via ORAL
  Filled 2018-04-16 (×7): qty 3

## 2018-04-16 MED ORDER — ARIPIPRAZOLE ER 400 MG IM SRER
400.0000 mg | Freq: Once | INTRAMUSCULAR | Status: AC
  Administered 2018-04-16: 400 mg via INTRAMUSCULAR

## 2018-04-16 MED ORDER — TRAZODONE HCL 100 MG PO TABS
100.0000 mg | ORAL_TABLET | Freq: Every evening | ORAL | Status: DC | PRN
  Administered 2018-04-16 – 2018-04-19 (×4): 100 mg via ORAL
  Filled 2018-04-16 (×11): qty 1

## 2018-04-16 MED ORDER — LORAZEPAM 1 MG PO TABS
1.0000 mg | ORAL_TABLET | Freq: Two times a day (BID) | ORAL | Status: DC
  Administered 2018-04-16 (×2): 1 mg via ORAL
  Filled 2018-04-16 (×2): qty 1

## 2018-04-16 NOTE — H&P (Signed)
Psychiatric Admission Assessment Adult  Patient Identification: [name] Love MRN:  [id number] Date of Evaluation:  04/16/2018 Chief Complaint:  Schizophrenia Principal Diagnosis: <principal problem not specified> Diagnosis:  Active Problems:   Schizophrenia, acute (HCC)  History of Present Illness: Patient is seen and examined.  Patient is a 23 year old male with a known past psychiatric history significant for paranoid schizophrenia who presented to the [facility] emergency department on 04/15/2018.  The patient is essentially mute this a.m., and so all the history is collected from the electronic medical record.  He was apparently taken to the [facility] emergency department secondary to paranoid state.  His community service team worker provided information in the emergency department that he had been in his current state for approximately 1 month, and been off his medications for the last 2 months.  He apparently stands in the corner of his room and will not eat or take care of himself.  He was essentially mute in the emergency department.  He has unknown past psychiatric history significant for schizophrenia.  His last psychiatric hospitalization at our facility was on 03/22/2017.  He had been placed on Abilify and Abilify maintain at that time.  The electronic medical record revealed that his last admission prior to that was on 03/23/2016 at [facility] health.  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  His laboratories revealed a mild anemia, and his Depakote level was 25 on 04/16/2018.  He was admitted to the hospital for evaluation and stabilization.  Associated Signs/Symptoms: Depression Symptoms:  psychomotor retardation, disturbed sleep, weight loss, decreased appetite, (Hypo) Manic Symptoms:  Delusions, Hallucinations, Anxiety Symptoms:  Excessive Worry, Psychotic Symptoms:  Delusions, Hallucinations:  Auditory Visual Ideas of Reference, Paranoia, PTSD Symptoms: Negative Total Time spent with patient: 30 minutes  Past Psychiatric History: Previous diagnosis of schizophrenia.  Multiple inpatient stays with his last admission being on 03/22/2017.  He has been followed at [facility] as an outpatient.  Is the patient at risk to self? Yes.    Has the patient been a risk to self in the past 6 months? Yes.    Has the patient been a risk to self within the distant past? No.  Is the patient a risk to others? No.  Has the patient been a risk to others in the past 6 months? No.  Has the patient been a risk to others within the distant past? No.   Prior Inpatient Therapy:   Prior Outpatient Therapy:    Alcohol Screening:   Substance Abuse History in the last 12 months:  No. Consequences of Substance Abuse: Negative Previous Psychotropic Medications: Yes  Psychological Evaluations: Yes  Past Medical History:  Past Medical History:  Diagnosis Date  . Schizophrenia, acute (HCC)    History reviewed. No pertinent surgical history. Family History:  Family History  Problem Relation Age of Onset  . Mental illness Cousin    Family Psychiatric  History: Unspecified psychiatric illness in a cousin. Tobacco Screening:   Social History:  Social History   Substance and Sexual Activity  Alcohol Use Yes   Comment: a little      Social History   Substance and Sexual Activity  Drug Use No   Comment: denies    Additional Social History:                           Allergies:   Allergies  Allergen Reactions  .  Chocolate Rash    unknown  . Latex Rash    Condoms only  . Peanuts [Peanut Oil] Rash    All Nuts  . Tape Rash   Lab Results:  Results for orders placed or performed during the hospital encounter of 04/15/18 (from the past 48 hour(s))  Valproic acid level     Status: Abnormal   Collection Time: 04/16/18  6:40 AM  Result Value Ref Range   Valproic Acid Lvl 25 (L) 50.0  - 100.0 ug/mL    Comment: Performed at [facility], [address], [place], [place] [address]    Blood Alcohol level:  Lab Results  Component Value Date   [facility] <10 04/14/2018   ETH <10 03/21/2017    Metabolic Disorder Labs:  Lab Results  Component Value Date   HGBA1C 5.4 07/15/2015   MPG 117 11/27/2014   Lab Results  Component Value Date   PROLACTIN 57.5 (H) 08/20/2015   PROLACTIN 23.2 (H) 02/04/2015   Lab Results  Component Value Date   CHOL 225 (H) 07/15/2015   TRIG 74 07/15/2015   HDL 58 07/15/2015   CHOLHDL 3.9 07/15/2015   VLDL 15 07/15/2015   LDLCALC 152 (H) 07/15/2015   LDLCALC 164 (H) 11/27/2014    Current Medications: Current Facility-Administered Medications  Medication Dose Route Frequency Provider Last Rate Last Dose  . acetaminophen (TYLENOL) tablet 650 mg  650 mg Oral Q6H PRN Nira Conn A, NP   650 mg at 04/16/18 1122  . alum & mag hydroxide-simeth (MAALOX/MYLANTA) 200-200-20 MG/5ML suspension 30 mL  30 mL Oral Q4H PRN Nira Conn A, NP      . ARIPiprazole (ABILIFY) tablet 10 mg  10 mg Oral Daily Antonieta Pert, MD   10 mg at 04/16/18 1109  . divalproex (DEPAKOTE) DR tablet 750 mg  750 mg Oral QHS Antonieta Pert, MD      . feeding supplement (ENSURE ENLIVE) (ENSURE ENLIVE) liquid 237 mL  237 mL Oral BID BM Antonieta Pert, MD   237 mL at 04/16/18 1116  . LORazepam (ATIVAN) tablet 1 mg  1 mg Oral BID Antonieta Pert, MD   1 mg at 04/16/18 1109  . magnesium hydroxide (MILK OF MAGNESIA) suspension 30 mL  30 mL Oral Daily PRN Nira Conn A, NP      . traZODone (DESYREL) tablet 100 mg  100 mg Oral QHS,MR X 1 Alexys Lobello, Marlane Mingle, MD       PTA Medications: Medications Prior to Admission  Medication Sig Dispense Refill Last Dose  . ARIPiprazole (ABILIFY) 15 MG tablet Take 1 tablet (15 mg total) by mouth daily. For mood control (Patient not taking: Reported on 04/14/2018) 15 tablet 0 Not Taking at Unknown time  . ARIPiprazole  ER 400 MG SRER Inject 400 mg into the muscle every 30 (thirty) days. (Due on 04-24-17): For mood control (Patient not taking: Reported on 04/14/2018) 1 each 0 Not Taking at Unknown time  . divalproex (DEPAKOTE ER) 250 MG 24 hr tablet Take 3 tablets (750 mg total) by mouth at bedtime. For mood stabilization (Patient not taking: Reported on 04/14/2018) 90 tablet 0 Not Taking at Unknown time  . hydrOXYzine (ATARAX/VISTARIL) 25 MG tablet Take 1 tablet (25 mg total) by mouth 3 (three) times daily as needed for anxiety. (Patient not taking: Reported on 04/14/2018) 90 tablet 0 Not Taking at Unknown time  . LORazepam (ATIVAN) 1 MG tablet Take 1 tablet (1 mg total) by mouth  every 6 (six) hours as needed for anxiety. (Patient not taking: Reported on 04/14/2018) 30 tablet 0 Not Taking at Unknown time  . nicotine (NICODERM CQ - DOSED IN MG/24 HOURS) 14 mg/24hr patch Place 1 patch (14 mg total) onto the skin daily. (May purchase from over the counter): For smoking cessation (Patient not taking: Reported on 04/14/2018) 28 patch 0 Not Taking at Unknown time  . traZODone (DESYREL) 100 MG tablet Take 1 tablet (100 mg total) by mouth at bedtime as needed for sleep. (Patient not taking: Reported on 04/14/2018) 30 tablet 0 Not Taking at Unknown time    Musculoskeletal: Strength & Muscle Tone: within normal limits Gait & Station: Lying in bed under the covers Patient leans: Lying in bed under the covers  Psychiatric Specialty Exam: Physical Exam  Nursing note and vitals reviewed. Constitutional: He appears well-developed and well-nourished.  HENT:  Head: Normocephalic and atraumatic.  Respiratory: Effort normal.  Neurological: He is alert.    ROS  Blood pressure 125/65, pulse 71, temperature 98.5 F (36.9 C), temperature source Oral, resp. rate 16, height 5\' 11"  (1.803 m), weight 66.7 kg, SpO2 99 %.Body mass index is 20.5 kg/m.  General Appearance: Disheveled  Eye Contact:  Fair  Speech:  Essentially mute  Volume:   Decreased  Mood:  Dysphoric  Affect:  Flat  Thought Process:  Goal Directed and Descriptions of Associations: Loose  Orientation:  Negative  Thought Content:  Delusions, Hallucinations: Auditory Visual and Paranoid Ideation  Suicidal Thoughts:  No  Homicidal Thoughts:  No  Memory:  Negative  Judgement:  Impaired  Insight:  Lacking  Psychomotor Activity:  Psychomotor Retardation  Concentration:  Concentration: Poor and Attention Span: Poor  Recall:  Poor  Fund of Knowledge:  Poor  Language:  NA  Akathisia:  Negative  Handed:  Right  AIMS (if indicated):     Assets:  Desire for Improvement Resilience  ADL's:  Intact  Cognition:  WNL  Sleep:  Number of Hours: 5.5    Treatment Plan Summary: Daily contact with patient to assess and evaluate symptoms and progress in treatment, Medication management and Plan : Patient is seen and examined.  Patient is a 23 year old male with a past psychiatric history significant for schizophrenia as well as borderline intellectual function who presented to the emergency department secondary to noncompliance with his medications and worsening mental state.  He will be admitted to the hospital.  He will be integrated into the milieu.  He will be restarted on Abilify oral as well as the Abilify maintain a.  He will also be restarted on his Depakote 750 mg p.o. nightly.  We will also continue his hydroxyzine, Ativan and trazodone.  Observation Level/Precautions:  15 minute checks  Laboratory:  Chemistry Profile  Psychotherapy:    Medications:    Consultations:    Discharge Concerns:    Estimated LOS:  Other:     Physician Treatment Plan for Primary Diagnosis: <principal problem not specified> Long Term Goal(s): Improvement in symptoms so as ready for discharge  Short Term Goals: Ability to identify changes in lifestyle to reduce recurrence of condition will improve, Ability to verbalize feelings will improve, Ability to disclose and discuss suicidal  ideas, Ability to demonstrate self-control will improve, Ability to identify and develop effective coping behaviors will improve, Ability to maintain clinical measurements within normal limits will improve and Compliance with prescribed medications will improve  Physician Treatment Plan for Secondary Diagnosis: Active Problems:   Schizophrenia, acute (HCC)  Long Term Goal(s): Improvement in symptoms so as ready for discharge  Short Term Goals: Ability to identify changes in lifestyle to reduce recurrence of condition will improve, Ability to verbalize feelings will improve, Ability to disclose and discuss suicidal ideas, Ability to demonstrate self-control will improve, Ability to identify and develop effective coping behaviors will improve, Ability to maintain clinical measurements within normal limits will improve and Compliance with prescribed medications will improve  I certify that inpatient services furnished can reasonably be expected to improve the patient's condition.    Antonieta PertGreg Lawson Ambrosia Wisnewski, MD 2/2/20202:01 PM

## 2018-04-16 NOTE — Progress Notes (Signed)
D: Pt denies SI/HI/AVH. Pt is pleasant and cooperative. Pt continues to present confused at times. Pt was reluctant to take night medications, pt appeared to have an issue with the number of medications. Pt continues to be paranoid at times.   A: Pt was offered support and encouragement. Pt was given scheduled medications. Pt was encourage to attend groups. Q 15 minute checks were done for safety.   R: safety maintained on unit.   Problem: Activity: Goal: Interest or engagement in activities will improve Outcome: Progressing

## 2018-04-16 NOTE — B[facility] Suicide Risk Assessment (Signed)
[facility] Admission Suicide Risk Assessment   Nursing information obtained from:  Patient Demographic factors:  Male, Unemployed Current Mental Status:  NA Loss Factors:  NA Historical Factors:  NA Risk Reduction Factors:  Positive social support  Total Time spent with patient: 30 minutes Principal Problem: <principal problem not specified> Diagnosis:  Active Problems:   Schizophrenia, acute (HCC)  Subjective Data: Patient is seen and examined.  Patient is a 23 year old male with a known past psychiatric history significant for paranoid schizophrenia who presented to the [facility] emergency department on 04/15/2018.  The patient is essentially mute this a.m., and so all the history is collected from the electronic medical record.  He was apparently taken to the [facility] emergency department secondary to paranoid state.  His community service team worker provided information in the emergency department that he had been in his current state for approximately 1 month, and been off his medications for the last 2 months.  He apparently stands in the corner of his room and will not eat or take care of himself.  He was essentially mute in the emergency department.  He has unknown past psychiatric history significant for schizophrenia.  His last psychiatric hospitalization at our facility was on 03/22/2017.  He had been placed on Abilify and Abilify maintain at that time.  The electronic medical record revealed that his last admission prior to that was on 03/23/2016 at [facility].  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  His laboratories revealed a mild anemia, and his Depakote level was 25 on 04/16/2018.  He was admitted to the hospital for evaluation and stabilization.  Continued Clinical Symptoms:    The "Alcohol Use Disorders Identification Test", Guidelines for Use in Primary Care, Second Edition.  World Science writer  [facility]). Score between 0-7:  no or low risk or alcohol related problems. Score between 8-15:  moderate risk of alcohol related problems. Score between 16-19:  high risk of alcohol related problems. Score 20 or above:  warrants further diagnostic evaluation for alcohol dependence and treatment.   CLINICAL FACTORS:   Schizophrenia:   Paranoid or undifferentiated type   Musculoskeletal: Strength & Muscle Tone: within normal limits Gait & Station: normal Patient leans: N/A  Psychiatric Specialty Exam: Physical Exam  Nursing note and vitals reviewed. Constitutional: He is oriented to person, place, and time. He appears well-developed and well-nourished.  HENT:  Head: Normocephalic and atraumatic.  Respiratory: Effort normal.  Neurological: He is alert and oriented to person, place, and time.    ROS  Blood pressure 125/65, pulse 71, temperature 98.5 F (36.9 C), temperature source Oral, resp. rate 16, height 5\' 11"  (1.803 m), weight 66.7 kg, SpO2 99 %.Body mass index is 20.5 kg/m.  General Appearance: Disheveled  Eye Contact:  Fair  Speech:  He is essentially mute  Volume:  Essentially mute  Mood:  Blunted  Affect:  Blunt  Thought Process:  Coherent and Descriptions of Associations: Loose  Orientation:  NA  Thought Content:  Paranoid Ideation  Suicidal Thoughts:  No  Homicidal Thoughts:  No  Memory:  Immediate;   Poor Recent;   Poor Remote;   Poor  Judgement:  Impaired  Insight:  Lacking  Psychomotor Activity:  Psychomotor Retardation  Concentration:  Concentration: Poor and Attention Span: Poor  Recall:  Poor  Fund of Knowledge:  Poor  Language:  Poor  Akathisia:  Negative  Handed:  Right  AIMS (if indicated):  Assets:  Housing  ADL's:  Impaired  Cognition:  Impaired,  Moderate  Sleep:  Number of Hours: 5.5      COGNITIVE FEATURES THAT CONTRIBUTE TO RISK:  None    SUICIDE RISK:   Minimal: No identifiable suicidal ideation.  Patients presenting with no risk  factors but with morbid ruminations; may be classified as minimal risk based on the severity of the depressive symptoms  PLAN OF CARE: Patient is seen and examined.  Patient is a 23 year old male with a past psychiatric history significant for schizophrenia as well as borderline intellectual function who presented to the emergency department secondary to noncompliance with his medications and worsening mental state.  He will be admitted to the hospital.  He will be integrated into the milieu.  He will be restarted on Abilify oral as well as the Abilify maintain a.  He will also be restarted on his Depakote 750 mg p.o. nightly.  We will also continue his hydroxyzine, Ativan and trazodone.  I certify that inpatient services furnished can reasonably be expected to improve the patient's condition.   Antonieta Pert, MD 04/16/2018, 10:12 AM

## 2018-04-16 NOTE — Progress Notes (Signed)
NUTRITION ASSESSMENT  Pt identified as at risk on the Malnutrition Screen Tool  INTERVENTION: 1. Supplements: continue Ensure Enlive po BID, each supplement provides 350 kcal and 20 grams of protein  NUTRITION DIAGNOSIS: Unintentional weight loss related to sub-optimal intake as evidenced by pt report.   Goal: Pt to meet >/= 90% of their estimated nutrition needs.  Monitor:  PO intake  Assessment:  Pt admitted with schizophrenia. Pt has not been compliant with medications and has had poor PO intakes PTA. Pt has been ordered Ensure supplements. Per weight records, pt has lost 13 lb since 03/13/18 (8% wt loss x 1 month, significant for time frame).   Height: Ht Readings from Last 1 Encounters:  04/15/18 5\' 11"  (1.803 m)    Weight: Wt Readings from Last 1 Encounters:  04/15/18 66.7 kg    Weight Hx: Wt Readings from Last 10 Encounters:  04/15/18 66.7 kg  03/13/18 72.6 kg  03/22/17 74.8 kg  03/21/17 86.2 kg  11/10/16 86.2 kg  09/01/16 86.2 kg  10/08/15 94.8 kg  08/17/15 98 kg  08/07/15 93 kg  07/09/15 91.2 kg    BMI:  Body mass index is 20.5 kg/m. Pt meets criteria for normal based on current BMI.  Estimated Nutritional Needs: Kcal: 25-30 kcal/kg Protein: > 1 gram protein/kg Fluid: 1 ml/kcal  Diet Order:  Diet Order            Diet regular Room service appropriate? Yes; Fluid consistency: Thin  Diet effective now             Pt is also offered choice of unit snacks mid-morning and mid-afternoon.  Pt is eating as desired.   Lab results and medications reviewed.   Tilda Franco, MS, RD, LDN [name] Inpatient Clinical Dietitian Pager: [phone] After Hours Pager: [phone]

## 2018-04-16 NOTE — BHH Group Notes (Signed)
BHH LCSW Group Therapy Note  Date/Time:  04/16/2018  11:00AM-12:00PM  Type of Therapy and Topic:  Group Therapy:  Music and Mood  Participation Level:  Did Not Attend   Description of Group: In this process group, members listened to a variety of genres of music and identified that different types of music evoke different responses.  Patients were encouraged to identify music that was soothing for them and music that was energizing for them.  Patients discussed how this knowledge can help with wellness and recovery in various ways including managing depression and anxiety as well as encouraging healthy sleep habits.    Therapeutic Goals: 1. Patients will explore the impact of different varieties of music on mood 2. Patients will verbalize the thoughts they have when listening to different types of music 3. Patients will identify music that is soothing to them as well as music that is energizing to them 4. Patients will discuss how to use this knowledge to assist in maintaining wellness and recovery 5. Patients will explore the use of music as a coping skill  Summary of Patient Progress:  N/A  Therapeutic Modalities: Solution Focused Brief Therapy Activity   Jamiria Langill Grossman-Orr, LCSW    

## 2018-04-16 NOTE — Plan of Care (Signed)
D: Patient isolates to room this morning. He was provided shower supplies and encouraged to take a shower, but still has not and is malodorous. He is medication compliant and received his LAI this morning. Patient denies SI/HI/AVH. He still has thought blocking, but seems clearer this morning than yesterday when he was admitted.  A: Patient checked q15 min, and checks reviewed. Reviewed medication changes with patient and educated on side effects. Educated patient on importance of attending group therapy sessions and educated on several coping skills. Encouarged participation in milieu through recreation therapy and attending meals with peers. Support and encouragement provided. Fluids offered. R: Patient receptive to education on medications, and is medication compliant. Patient contracts for safety on the unit.

## 2018-04-17 DIAGNOSIS — F23 Brief psychotic disorder: Secondary | ICD-10-CM

## 2018-04-17 MED ORDER — HALOPERIDOL 5 MG PO TABS
5.0000 mg | ORAL_TABLET | Freq: Three times a day (TID) | ORAL | Status: DC
  Administered 2018-04-17 – 2018-04-18 (×3): 5 mg via ORAL
  Filled 2018-04-17 (×6): qty 1

## 2018-04-17 MED ORDER — CLONAZEPAM 0.5 MG PO TABS
0.5000 mg | ORAL_TABLET | Freq: Two times a day (BID) | ORAL | Status: DC
  Administered 2018-04-17 – 2018-04-18 (×3): 0.5 mg via ORAL
  Filled 2018-04-17 (×3): qty 1

## 2018-04-17 MED ORDER — BENZTROPINE MESYLATE 0.5 MG PO TABS
0.5000 mg | ORAL_TABLET | Freq: Two times a day (BID) | ORAL | Status: DC
  Administered 2018-04-17 – 2018-04-19 (×5): 0.5 mg via ORAL
  Filled 2018-04-17 (×7): qty 1

## 2018-04-17 NOTE — BHH Counselor (Signed)
CSW attempted to meet with pt to complete PSA.  Pt unable to provide any meaningful information.  When asked what he wanted help with pt responded, "more smores."  Pt said everything was going "fine". Garner Nash, MSW, LCSW Clinical Social Worker 04/17/2018 10:43 AM

## 2018-04-17 NOTE — B[facility] Group Notes (Signed)
[facility] LCSW Group Therapy Note  Date/Time: 04/17/18, 1300  Type of Therapy and Topic:  Group Therapy:  Overcoming Obstacles  Participation Level:  none  Description of Group:    In this group patients will be encouraged to explore what they see as obstacles to their own wellness and recovery. They will be guided to discuss their thoughts, feelings, and behaviors related to these obstacles. The group will process together ways to cope with barriers, with attention given to specific choices patients can make. Each patient will be challenged to identify changes they are motivated to make in order to overcome their obstacles. This group will be process-oriented, with patients participating in exploration of their own experiences as well as giving and receiving support and challenge from other group members.  Therapeutic Goals: 1. Patient will identify personal and current obstacles as they relate to admission. 2. Patient will identify barriers that currently interfere with their wellness or overcoming obstacles.  3. Patient will identify feelings, thought process and behaviors related to these barriers. 4. Patient will identify two changes they are willing to make to overcome these obstacles:    Summary of Patient Progress: Pt attended group, appeared to be asleep several times.  Pt did not participate in group discussion and what he did say was not relevant.  When CSW asked him about current obstacles, he replied;"Food stamps".        Therapeutic Modalities:   Cognitive Behavioral Therapy Solution Focused Therapy Motivational Interviewing Relapse Prevention Therapy  Daleen Squibb, LCSW

## 2018-04-17 NOTE — Progress Notes (Signed)
Recreation Therapy Notes  Date: 2.3.20 Time: 1000 Location: 500 Hall Dayroom  Group Topic: Anxiety  Goal Area(s) Addresses:  Patient will identify triggers for anxiety.  Patient will identify physical symptoms to anxiety.  Patient will identify coping skills to deal with anxiety.   Intervention:  Worksheet, pencils  Activity:  Introduction to Anxiety.  Patients were to identify at least 3 things that trigger anxiety, physical symptoms they have when anxious, thoughts they have when anxious and coping skills the use to deal with anxiety.  Education: Anger Management, Discharge Planning   Education Outcome: Acknowledges education/In group clarification offered/Needs additional education.   Clinical Observations/Feedback: Patient did not attend group.      [name], LRT/CTRS         [name] A 04/17/2018 11:39 AM

## 2018-04-17 NOTE — Progress Notes (Signed)
D: Pt denies SI/HI/AVH. Pt is pleasant and cooperative. Pt spent much of the evening in his room. Pt very paranoid and suspicious, pt needing much encouragement to take medication.  A: Pt was offered support and encouragement. Pt was encourage to attend groups. Q 15 minute checks were done for safety.  R: safety maintained on unit.  Problem: Activity: Goal: Sleeping patterns will improve Outcome: Progressing

## 2018-04-17 NOTE — Progress Notes (Signed)
Pt up appeared confused, but was more talkative this morning" I didn't appreciate you making fun of me, are you my dad, you;re an amazing dude, you help people, I saw you walking down [address] 2 years ago" pt appeared disorganized and paranoid. Pt appeared to be more interactive with peers in the dayroom

## 2018-04-17 NOTE — Progress Notes (Signed)
[facility]  04/17/2018 10:32 AM [name] Love  MRN:  [id number] Subjective:    Patient somewhat vague on interview cannot articulate why he is here, he is no longer mute of course and he does show some improvement but is not providing a great deal of new information. Vitals are stable and he denied auditory visual hallucinations, denies thoughts of harming self or others is tangential and distracted by the ward, No EPS or TD Principal Problem: Schizophrenia with recent mutism  diagnosis: Active Problems:   Schizophrenia, acute (HCC)  Total Time spent with patient: 20 minutes  Past Medical History:  Past Medical History:  Diagnosis Date  . Schizophrenia, acute (HCC)    History reviewed. No pertinent surgical history. Family History:  Family History  Problem Relation Age of Onset  . Mental illness Cousin   Social History:  Social History   Substance and Sexual Activity  Alcohol Use Yes   Comment: a little      Social History   Substance and Sexual Activity  Drug Use No   Comment: denies    Social History   Socioeconomic History  . Marital status: Single    Spouse name: Not on file  . Number of children: Not on file  . Years of education: Not on file  . Highest education level: Not on file  Occupational History  . Not on file  Social Needs  . Financial resource strain: Not on file  . Food insecurity:    Worry: Not on file    Inability: Not on file  . Transportation needs:    Medical: Not on file    Non-medical: Not on file  Tobacco Use  . Smoking status: Current Every Day Smoker    Packs/day: 0.00    Years: 4.00    Pack years: 0.00  . Smokeless tobacco: Never Used  Substance and Sexual Activity  . Alcohol use: Yes    Comment: a little   . Drug use: No    Comment: denies  . Sexual activity: Not Currently  Lifestyle  . Physical activity:    Days per week: Not on file    Minutes per session: Not on file  . Stress: Not on file  Relationships  .  Social connections:    Talks on phone: Not on file    Gets together: Not on file    Attends religious service: Not on file    Active member of club or organization: Not on file    Attends meetings of clubs or organizations: Not on file    Relationship status: Not on file  Other Topics Concern  . Not on file  Social History Narrative  . Not on file   Additional Social History:                         Sleep: Good  Appetite:  Fair  Current Medications: Current Facility-Administered Medications  Medication Dose Route Frequency Provider Last Rate Last Dose  . acetaminophen (TYLENOL) tablet 650 mg  650 mg Oral Q6H PRN Nira ConnBerry, Jason A, NP   650 mg at 04/16/18 1122  . alum & mag hydroxide-simeth (MAALOX/MYLANTA) 200-200-20 MG/5ML suspension 30 mL  30 mL Oral Q4H PRN Nira ConnBerry, Jason A, NP      . benztropine (COGENTIN) tablet 0.5 mg  0.5 mg Oral BID Malvin JohnsFarah, Melville Engen, MD      . divalproex (DEPAKOTE) DR tablet 750 mg  750 mg Oral QHS Clary,  Marlane Mingle, MD   750 mg at 04/16/18 2121  . feeding supplement (ENSURE ENLIVE) (ENSURE ENLIVE) liquid 237 mL  237 mL Oral BID BM Antonieta Pert, MD   237 mL at 04/16/18 1448  . haloperidol (HALDOL) tablet 5 mg  5 mg Oral TID Malvin Johns, MD      . magnesium hydroxide (MILK OF MAGNESIA) suspension 30 mL  30 mL Oral Daily PRN Nira Conn A, NP      . traZODone (DESYREL) tablet 100 mg  100 mg Oral QHS,MR X 1 Antonieta Pert, MD   100 mg at 04/16/18 2121    Lab Results:  Results for orders placed or performed during the hospital encounter of 04/15/18 (from the past 48 hour(s))  Valproic acid level     Status: Abnormal   Collection Time: 04/16/18  6:40 AM  Result Value Ref Range   Valproic Acid Lvl 25 (L) 50.0 - 100.0 ug/mL    Comment: Performed at [facility], [address] [place], [place] [address]    Blood Alcohol level:  Lab Results  Component Value Date   [facility] <10 04/14/2018   ETH <10 03/21/2017    Metabolic  Disorder Labs: Lab Results  Component Value Date   HGBA1C 5.4 07/15/2015   MPG 117 11/27/2014   Lab Results  Component Value Date   PROLACTIN 57.5 (H) 08/20/2015   PROLACTIN 23.2 (H) 02/04/2015   Lab Results  Component Value Date   CHOL 225 (H) 07/15/2015   TRIG 74 07/15/2015   HDL 58 07/15/2015   CHOLHDL 3.9 07/15/2015   VLDL 15 07/15/2015   LDLCALC 152 (H) 07/15/2015   LDLCALC 164 (H) 11/27/2014    Physical Findings: AIMS: Facial and Oral Movements Muscles of Facial Expression: None, normal Lips and Perioral Area: None, normal Jaw: None, normal Tongue: None, normal,Extremity Movements Upper (arms, wrists, hands, fingers): None, normal Lower (legs, knees, ankles, toes): None, normal, Trunk Movements Neck, shoulders, hips: None, normal, Overall Severity Severity of abnormal movements (highest score from questions above): None, normal Incapacitation due to abnormal movements: None, normal Patient's awareness of abnormal movements (rate only patient's report): No Awareness, Dental Status Current problems with teeth and/or dentures?: No Does patient usually wear dentures?: No  CIWA:  CIWA-Ar Total: 0 COWS:  COWS Total Score: 0  Musculoskeletal: Strength & Muscle Tone: within normal limits Gait & Station: normal Patient leans: N/A  Psychiatric Specialty Exam: Physical Exam  ROS  Blood pressure 116/74, pulse (!) 109, temperature 98.1 F (36.7 C), temperature source Oral, resp. rate 16, height 5\' 11"  (1.803 m), weight 66.7 kg, SpO2 99 %.Body mass index is 20.5 kg/m.  General Appearance: Casual  Eye Contact:  Good  Speech:  Slow  Volume:  Decreased  Mood:  Dysphoric  Affect:  Blunt  Thought Process:  Irrelevant  Orientation:  Full (Time, Place, and Person)  Thought Content:  Illogical and Tangential  Suicidal Thoughts:  No  Homicidal Thoughts:  No  Memory:  Immediate;   Fair  Judgement:  Fair  Insight:  Fair  Psychomotor Activity:  Decreased  Concentration:   Concentration: Fair  Recall:  [employer] of Knowledge:  Fair  Language:  Fair  Akathisia:  No  Handed:  Right  AIMS (if indicated):     Assets:  Leisure Time Physical Health  ADL's:  Intact  Cognition:  WNL  Sleep:  Number of Hours: 6     Treatment Plan Summary: Daily contact with  patient to assess and evaluate symptoms and progress in treatment, Medication management and Plan Continue current precautions switch to a more potent oral antipsychotic in anticipation of long-acting injectable, add low-dose clonazepam as to the lorazepam to new reality based therapy and current precautions and 15-minute checks  Yang Rack, MD 04/17/2018, 10:32 AM

## 2018-04-17 NOTE — Progress Notes (Signed)
Recreation Therapy Notes  INPATIENT RECREATION THERAPY ASSESSMENT  Patient Details Name: [name] Love MRN: [id number] DOB: 05-Jul-1995 Today's Date: 04/17/2018       Information Obtained From: Patient  Able to Participate in Assessment/Interview: Yes(Some patients answers were confusing and didn't make sense.)  Patient Presentation: Confused(Pt seemed to be responding to internal stimuli at times.)  Reason for Admission (Per Patient): Other (Comments)("Eating a lot of stuff in the kitchen")  Patient Stressors: Other (Comment)(Connections)  Coping Skills:   TV, Sports, Music, Substance Abuse, Art  Leisure Interests (2+):  Individual - Other (Comment)(Cook, Give out cookies to adults, Raise dogs)  Frequency of Recreation/Participation: Other (Comment)("It never happens")  Awareness of Community Resources:  Yes  Community Resources:  Recreation Center  Current Use: No  If no, Barriers?:    Expressed Interest in [employer] Information: No  County of Residence:  [place]  Patient Main Form of Transportation: Bicycle  Patient Strengths:  Heritage manager, Focus  Patient Identified Areas of Improvement:  Move faster, Binding/Stretching  Patient Goal for Hospitalization:  "Get [place] to my neighborhood"  Current SI (including self-harm):  Yes(Rated a 2: Contracts for safety)  Current HI:  No  Current AVH: No  Staff Intervention Plan: Group Attendance, Collaborate with Interdisciplinary Treatment Team  Consent to Intern Participation: N/A     [name], LRT/CTRS  [name] A 04/17/2018, 1:57 PM

## 2018-04-17 NOTE — Tx Team (Signed)
Interdisciplinary Treatment and Diagnostic Plan Update  04/17/2018 Time of Session: 1761 [name] Love MRN: [id number]  Principal Diagnosis: <principal problem not specified>  Secondary Diagnoses: Active Problems:   Schizophrenia, acute ([place])   Current Medications:  Current Facility-Administered Medications  Medication Dose Route Frequency Provider Last Rate Last Dose  . acetaminophen (TYLENOL) tablet 650 mg  650 mg Oral Q6H PRN Lindon Romp A, NP   650 mg at 04/16/18 1122  . alum & mag hydroxide-simeth (MAALOX/MYLANTA) 200-200-20 MG/5ML suspension 30 mL  30 mL Oral Q4H PRN Lindon Romp A, NP      . benztropine (COGENTIN) tablet 0.5 mg  0.5 mg Oral BID Johnn Hai, MD   0.5 mg at 04/17/18 1154  . clonazePAM (KLONOPIN) tablet 0.5 mg  0.5 mg Oral BID Johnn Hai, MD   0.5 mg at 04/17/18 1155  . divalproex (DEPAKOTE) DR tablet 750 mg  750 mg Oral QHS Sharma Covert, MD   750 mg at 04/16/18 2121  . feeding supplement (ENSURE ENLIVE) (ENSURE ENLIVE) liquid 237 mL  237 mL Oral BID BM Sharma Covert, MD   237 mL at 04/16/18 1448  . haloperidol (HALDOL) tablet 5 mg  5 mg Oral TID Johnn Hai, MD   5 mg at 04/17/18 1154  . magnesium hydroxide (MILK OF MAGNESIA) suspension 30 mL  30 mL Oral Daily PRN Lindon Romp A, NP      . traZODone (DESYREL) tablet 100 mg  100 mg Oral QHS,MR X 1 Sharma Covert, MD   100 mg at 04/16/18 2121   PTA Medications: Medications Prior to Admission  Medication Sig Dispense Refill Last Dose  . ARIPiprazole (ABILIFY) 15 MG tablet Take 1 tablet (15 mg total) by mouth daily. For mood control (Patient not taking: Reported on 04/14/2018) 15 tablet 0 Not Taking at Unknown time  . ARIPiprazole ER 400 MG SRER Inject 400 mg into the muscle every 30 (thirty) days. (Due on 04-24-17): For mood control (Patient not taking: Reported on 04/14/2018) 1 each 0 Not Taking at Unknown time  . divalproex (DEPAKOTE ER) 250 MG 24 hr tablet Take 3 tablets (750 mg total) by mouth at  bedtime. For mood stabilization (Patient not taking: Reported on 04/14/2018) 90 tablet 0 Not Taking at Unknown time  . hydrOXYzine (ATARAX/VISTARIL) 25 MG tablet Take 1 tablet (25 mg total) by mouth 3 (three) times daily as needed for anxiety. (Patient not taking: Reported on 04/14/2018) 90 tablet 0 Not Taking at Unknown time  . LORazepam (ATIVAN) 1 MG tablet Take 1 tablet (1 mg total) by mouth every 6 (six) hours as needed for anxiety. (Patient not taking: Reported on 04/14/2018) 30 tablet 0 Not Taking at Unknown time  . nicotine (NICODERM CQ - DOSED IN MG/24 HOURS) 14 mg/24hr patch Place 1 patch (14 mg total) onto the skin daily. (May purchase from over the counter): For smoking cessation (Patient not taking: Reported on 04/14/2018) 28 patch 0 Not Taking at Unknown time  . traZODone (DESYREL) 100 MG tablet Take 1 tablet (100 mg total) by mouth at bedtime as needed for sleep. (Patient not taking: Reported on 04/14/2018) 30 tablet 0 Not Taking at Unknown time    Patient Stressors: Other: UTA  Patient Strengths: Physical Health Supportive family/friends  Treatment Modalities: Medication Management, Group therapy, Case management,  1 to 1 session with clinician, Psychoeducation, Recreational therapy.   Physician Treatment Plan for Primary Diagnosis: <principal problem not specified> Long Term Goal(s): Improvement in symptoms so as ready  for discharge Improvement in symptoms so as ready for discharge   Short Term Goals: Ability to identify changes in lifestyle to reduce recurrence of condition will improve Ability to verbalize feelings will improve Ability to disclose and discuss suicidal ideas Ability to demonstrate self-control will improve Ability to identify and develop effective coping behaviors will improve Ability to maintain clinical measurements within normal limits will improve Compliance with prescribed medications will improve Ability to identify changes in lifestyle to reduce  recurrence of condition will improve Ability to verbalize feelings will improve Ability to disclose and discuss suicidal ideas Ability to demonstrate self-control will improve Ability to identify and develop effective coping behaviors will improve Ability to maintain clinical measurements within normal limits will improve Compliance with prescribed medications will improve  Medication Management: Evaluate patient's response, side effects, and tolerance of medication regimen.  Therapeutic Interventions: 1 to 1 sessions, Unit Group sessions and Medication administration.  Evaluation of Outcomes: Not Met  Physician Treatment Plan for Secondary Diagnosis: Active Problems:   Schizophrenia, acute ([place])  Long Term Goal(s): Improvement in symptoms so as ready for discharge Improvement in symptoms so as ready for discharge   Short Term Goals: Ability to identify changes in lifestyle to reduce recurrence of condition will improve Ability to verbalize feelings will improve Ability to disclose and discuss suicidal ideas Ability to demonstrate self-control will improve Ability to identify and develop effective coping behaviors will improve Ability to maintain clinical measurements within normal limits will improve Compliance with prescribed medications will improve Ability to identify changes in lifestyle to reduce recurrence of condition will improve Ability to verbalize feelings will improve Ability to disclose and discuss suicidal ideas Ability to demonstrate self-control will improve Ability to identify and develop effective coping behaviors will improve Ability to maintain clinical measurements within normal limits will improve Compliance with prescribed medications will improve     Medication Management: Evaluate patient's response, side effects, and tolerance of medication regimen.  Therapeutic Interventions: 1 to 1 sessions, Unit Group sessions and Medication  administration.  Evaluation of Outcomes: Not Met   RN Treatment Plan for Primary Diagnosis: <principal problem not specified> Long Term Goal(s): Knowledge of disease and therapeutic regimen to maintain health will improve  Short Term Goals: Ability to identify and develop effective coping behaviors will improve and Compliance with prescribed medications will improve  Medication Management: RN will administer medications as ordered by provider, will assess and evaluate patient's response and provide education to patient for prescribed medication. RN will report any adverse and/or side effects to prescribing provider.  Therapeutic Interventions: 1 on 1 counseling sessions, Psychoeducation, Medication administration, Evaluate responses to treatment, Monitor vital signs and CBGs as ordered, Perform/monitor CIWA, COWS, AIMS and Fall Risk screenings as ordered, Perform wound care treatments as ordered.  Evaluation of Outcomes: Not Met   LCSW Treatment Plan for Primary Diagnosis: <principal problem not specified> Long Term Goal(s): Safe transition to appropriate next level of care at discharge, Engage patient in therapeutic group addressing interpersonal concerns.  Short Term Goals: Engage patient in aftercare planning with referrals and resources, Increase social support and Increase skills for wellness and recovery  Therapeutic Interventions: Assess for all discharge needs, 1 to 1 time with Social worker, Explore available resources and support systems, Assess for adequacy in community support network, Educate family and significant other(s) on suicide prevention, Complete Psychosocial Assessment, Interpersonal group therapy.  Evaluation of Outcomes: Not Met   Progress in Treatment: Attending groups: Yes. Participating in groups: No. Taking medication  as prescribed: Yes. Toleration medication: Yes. Family/Significant other contact made: No, will contact:  when given permission Patient  understands diagnosis: No. Discussing patient identified problems/goals with staff: No. Medical problems stabilized or resolved: Yes. Denies suicidal/homicidal ideation: Yes. Issues/concerns per patient self-inventory: No. Other: none  New problem(s) identified: No, Describe:  none  New Short Term/Long Term Goal(s):  Patient Goals:  Pt unable to identify goal.   Discharge Plan or Barriers:   Reason for Continuation of Hospitalization: Delusions  Medication stabilization  Estimated Length of Stay: 5-7 days  Attendees: Patient: [name] Love 04/17/2018   Physician: Dr. Jake Samples, MD 04/17/2018   Nursing: Elesa Massed, RN 04/17/2018   RN Care Manager: 04/17/2018   Social Worker: Lurline Idol, LCSW 04/17/2018   Recreational Therapist:  04/17/2018   Other:  04/17/2018   Other:  04/17/2018   Other: 04/17/2018        Scribe for Treatment Team: Joanne Chars, [place] 04/17/2018 2:52 PM

## 2018-04-18 MED ORDER — HALOPERIDOL 5 MG PO TABS
10.0000 mg | ORAL_TABLET | Freq: Every day | ORAL | Status: DC
  Administered 2018-04-18 – 2018-04-20 (×3): 10 mg via ORAL
  Filled 2018-04-18 (×4): qty 2

## 2018-04-18 MED ORDER — CLONAZEPAM 1 MG PO TABS
1.0000 mg | ORAL_TABLET | Freq: Two times a day (BID) | ORAL | Status: DC
  Administered 2018-04-18 – 2018-04-21 (×6): 1 mg via ORAL
  Filled 2018-04-18 (×6): qty 1

## 2018-04-18 MED ORDER — HALOPERIDOL 5 MG PO TABS
5.0000 mg | ORAL_TABLET | Freq: Two times a day (BID) | ORAL | Status: DC
  Administered 2018-04-18 – 2018-04-21 (×6): 5 mg via ORAL
  Filled 2018-04-18 (×9): qty 1

## 2018-04-18 NOTE — Progress Notes (Signed)
D: Pt denies SI/HI/AVH. Pt is pleasant and cooperative. Pt continues to be confused at times and disorganized. Pt continues to present paranoid.  A: Pt was offered support and encouragement. Pt was given scheduled medications. Pt was encourage to attend groups. Q 15 minute checks were done for safety.   R:Pt attends groups and interacts well with peers and staff. Pt is taking medication. Pt has no complaints.Pt receptive to treatment and safety maintained on unit.   Problem: Education: Goal: Emotional status will improve Outcome: Not Progressing Goal: Mental status will improve Outcome: Not Progressing   Problem: Activity: Goal: Interest or engagement in activities will improve Outcome: Progressing Goal: Sleeping patterns will improve Outcome: Progressing

## 2018-04-18 NOTE — Progress Notes (Signed)
Pt up at the nursing station , appeared to be afraid to go back in the room. Pt asked staff to check his room out because he was claustrophobic. Pt informed when checked the room out earlier and we check it every 15 min. Pt up asking for multiple snacks, pt given extra snacks due to medication possibly increasing appetite. Pt informed writer " I keep pissing on myself" pt informed he needed to go to the bathroom when he felt the need. Pt continues to be paranoid .

## 2018-04-18 NOTE — Progress Notes (Signed)
Pt stated he was scared due to him thinking about dead people

## 2018-04-18 NOTE — Progress Notes (Signed)
[facility] MD Progress Note  04/18/2018 8:02 AM [name] Love  MRN:  [id number] Subjective:    Patient still somewhat guarded was very paranoid last night afraid to go into his room he denies hallucinations when specifically questioned and on interview he tends to perseverate and tell me he is doing "okay" repeatedly in amount of what the question is but he does provide some answers that are meaningful Does not require much redirection today he did receive his Abilify Maintena on 2/1 at 400 mg and further his QTC is 411 so we can combine the Haldol and escalated as he is clearly more paranoid on this exam and last night Denies hallucinations No EPS or TD still vague and not fully oriented  Principal Problem: Schizophrenia acute exacerbation Diagnosis: Active Problems:   Schizophrenia, acute (HCC)  Total Time spent with patient: 20 minutes  Past Medical History:  Past Medical History:  Diagnosis Date  . Schizophrenia, acute (HCC)    History reviewed. No pertinent surgical history. Family History:  Family History  Problem Relation Age of Onset  . Mental illness Cousin   Social History:  Social History   Substance and Sexual Activity  Alcohol Use Yes   Comment: a little      Social History   Substance and Sexual Activity  Drug Use No   Comment: denies    Social History   Socioeconomic History  . Marital status: Single    Spouse name: Not on file  . Number of children: Not on file  . Years of education: Not on file  . Highest education level: Not on file  Occupational History  . Not on file  Social Needs  . Financial resource strain: Not on file  . Food insecurity:    Worry: Not on file    Inability: Not on file  . Transportation needs:    Medical: Not on file    Non-medical: Not on file  Tobacco Use  . Smoking status: Current Every Day Smoker    Packs/day: 0.00    Years: 4.00    Pack years: 0.00  . Smokeless tobacco: Never Used  Substance and Sexual Activity  .  Alcohol use: Yes    Comment: a little   . Drug use: No    Comment: denies  . Sexual activity: Not Currently  Lifestyle  . Physical activity:    Days per week: Not on file    Minutes per session: Not on file  . Stress: Not on file  Relationships  . Social connections:    Talks on phone: Not on file    Gets together: Not on file    Attends religious service: Not on file    Active member of club or organization: Not on file    Attends meetings of clubs or organizations: Not on file    Relationship status: Not on file  Other Topics Concern  . Not on file  Social History Narrative  . Not on file   Additional Social History:                         Sleep: Good  Appetite:  Good  Current Medications: Current Facility-Administered Medications  Medication Dose Route Frequency Provider Last Rate Last Dose  . acetaminophen (TYLENOL) tablet 650 mg  650 mg Oral Q6H PRN Nira Conn A, NP   650 mg at 04/18/18 0026  . alum & mag hydroxide-simeth (MAALOX/MYLANTA) 200-200-20 MG/5ML suspension 30 mL  30  mL Oral Q4H PRN Nira ConnBerry, Jason A, NP      . benztropine (COGENTIN) tablet 0.5 mg  0.5 mg Oral BID Malvin JohnsFarah, Calel Pisarski, MD   0.5 mg at 04/18/18 0755  . clonazePAM (KLONOPIN) tablet 1 mg  1 mg Oral BID Malvin JohnsFarah, Zalan Shidler, MD      . divalproex (DEPAKOTE) DR tablet 750 mg  750 mg Oral QHS Antonieta Pertlary, Greg Lawson, MD   750 mg at 04/17/18 2338  . feeding supplement (ENSURE ENLIVE) (ENSURE ENLIVE) liquid 237 mL  237 mL Oral BID BM Antonieta Pertlary, Greg Lawson, MD   237 mL at 04/16/18 1448  . haloperidol (HALDOL) tablet 10 mg  10 mg Oral QHS Malvin JohnsFarah, Mattheu Brodersen, MD      . haloperidol (HALDOL) tablet 5 mg  5 mg Oral BID Malvin JohnsFarah, Ellison Leisure, MD      . magnesium hydroxide (MILK OF MAGNESIA) suspension 30 mL  30 mL Oral Daily PRN Nira ConnBerry, Jason A, NP      . traZODone (DESYREL) tablet 100 mg  100 mg Oral QHS,MR X 1 Antonieta Pertlary, Greg Lawson, MD   100 mg at 04/17/18 2338    Lab Results: No results found for this or any previous visit (from the  past 48 hour(s)).  Blood Alcohol level:  Lab Results  Component Value Date   ETH <10 04/14/2018   ETH <10 03/21/2017    Metabolic Disorder Labs: Lab Results  Component Value Date   HGBA1C 5.4 07/15/2015   MPG 117 11/27/2014   Lab Results  Component Value Date   PROLACTIN 57.5 (H) 08/20/2015   PROLACTIN 23.2 (H) 02/04/2015   Lab Results  Component Value Date   CHOL 225 (H) 07/15/2015   TRIG 74 07/15/2015   HDL 58 07/15/2015   CHOLHDL 3.9 07/15/2015   VLDL 15 07/15/2015   LDLCALC 152 (H) 07/15/2015   LDLCALC 164 (H) 11/27/2014    Physical Findings: AIMS: Facial and Oral Movements Muscles of Facial Expression: None, normal Lips and Perioral Area: None, normal Jaw: None, normal Tongue: None, normal,Extremity Movements Upper (arms, wrists, hands, fingers): None, normal Lower (legs, knees, ankles, toes): None, normal, Trunk Movements Neck, shoulders, hips: None, normal, Overall Severity Severity of abnormal movements (highest score from questions above): None, normal Incapacitation due to abnormal movements: None, normal Patient's awareness of abnormal movements (rate only patient's report): No Awareness, Dental Status Current problems with teeth and/or dentures?: No Does patient usually wear dentures?: No  CIWA:  CIWA-Ar Total: 0 COWS:  COWS Total Score: 0  Musculoskeletal: Strength & Muscle Tone: within normal limits Gait & Station: normal Patient leans: N/A  Psychiatric Specialty Exam: Physical Exam  ROS  Blood pressure 115/68, pulse (!) 103, temperature 98.2 F (36.8 C), temperature source Oral, resp. rate 18, height 5\' 11"  (1.803 m), weight 66.7 kg, SpO2 99 %.Body mass index is 20.5 kg/m.  General Appearance: Casual  Eye Contact:  Minimal  Speech:  Clear and Coherent  Volume:  Decreased  Mood:  Dysphoric  Affect:  Blunt  Thought Process:  Irrelevant  Orientation: Person place general situation not exact date  Thought Content:  Delusions  Suicidal  Thoughts:  No  Homicidal Thoughts:  No  Memory:  Did not repeat 3/3  Judgement:  Intact  Insight:  Fair  Psychomotor Activity:  Decreased  Concentration:  Concentration: Fair  Recall:  Fair  Fund of Knowledge:  Poor  Language:  Poor  Akathisia:  Negative  Handed:  Right  AIMS (if indicated):     Assets:  Resilience Social Support  ADL's:  Intact  Cognition:  WNL  Sleep:  Number of Hours: 5.25     Treatment Plan Summary: Daily contact with patient to assess and evaluate symptoms and progress in treatment, Medication management and Plan Continue current meds escalate Haldol continue reality and cognitive based therapies  [name] Herda, MD 04/18/2018, 8:02 AM

## 2018-04-18 NOTE — Progress Notes (Signed)
Pt in hall pacing , appears to be paranoid about going in his room. Pt encouraged to go in the room and try to go to sleep

## 2018-04-18 NOTE — [facility] Suicide Risk Assessment (Signed)
[facility] INPATIENT:  Family/Significant Other Suicide Prevention Education  Suicide Prevention Education:  Education Completed; Addison Naegeli, mother, [phone], has been identified by the patient as the family member/significant other with whom the patient will be residing, and identified as the person(s) who will aid the patient in the event of a mental health crisis (suicidal ideations/suicide attempt).  With written consent from the patient, the family member/significant other has been provided the following suicide prevention education, prior to the and/or following the discharge of the patient.  The suicide prevention education provided includes the following:  Suicide risk factors  Suicide prevention and interventions  National Suicide Hotline telephone number  [facility] assessment telephone number  [facility] Emergency Assistance 911  [facility] and/or Residential Mobile Crisis Unit telephone number  Request made of family/significant other to:  Remove weapons (e.g., guns, rifles, knives), all items previously/currently identified as safety concern.  No guns in the home, per mother.    Remove drugs/medications (over-the-counter, prescriptions, illicit drugs), all items previously/currently identified as a safety concern.  The family member/significant other verbalizes understanding of the suicide prevention education information provided.  The family member/significant other agrees to remove the items of safety concern listed above.  Mother reports past year since last admission to [facility] has been up and down.  Pt frequently refuses his meds, has been aggressive when they try to press the issue.  Community support team through [facility] just started in the past month.  Pt also going to day program at [facility].  They tried injections, but pt would refuse to go to MD appt to get injection so ended up still unmedicated.   Lorri Frederick,  LCSW 04/18/2018, 9:44 AM

## 2018-04-18 NOTE — Progress Notes (Addendum)
Patient ID: [name] Love, male   DOB: Jul 13, 1995, 23 y.o.   MRN: [id number] D: Patient denies SI/HI and auditory and visual hallucinations. Patient is extremely paranoid.Patient has been sitting in the day room much of the am with no interaction with peers. Defecated and urinated on himself. Asking bizarre questions "Where is my grade paper".  A: Patient given emotional support from RN. Patient given medications per MD orders. Patient encouraged to attend groups and unit activities. Patient encouraged to come to staff with any questions or concerns.  R: Patient remains cooperative and appropriate. Will continue to monitor patient for safety.

## 2018-04-18 NOTE — [facility] Counselor (Signed)
Adult Comprehensive Assessment  Patient ID: Merrilyn Pumayrell Boyde, male   DOB: 25-Nov-1995, 25 y.o.   MRN: [id number]  Information Source: Information source: (and from mother)  Current Stressors:  Patient states their primary concerns and needs for treatment are:: "food stamps" Patient states their goals for this hospitilization and ongoing recovery are:: Unable to state goal Educational / Learning stressors: (Pt unable to identify stressors: "I'm eating too much food")  Living/Environment/Situation:  Living Arrangements: Parent, Other relatives Living conditions (as described by patient or guardian): Still stressful with pt being non compliant with meds frequently. Mother moved out for 3 months, but moved back in a month ago to more closely supervise.  Who else lives in the home?: mother, brother, sister How long has patient lived in current situation?: over a year What is atmosphere in current home: Supportive    Family History: Marital status: Single Are you sexually active?: No What is your sexual orientation?: heterosexual Has your sexual activity been affected by drugs, alcohol, medication, or emotional stress?: unknown Does patient have children?: No  Childhood History: By whom was/is the patient raised?: Mother Additional childhood history information: Does not know who his father is. Description of patient's relationship with caregiver when they were a child: Pt has good relationship w mother, father unknown Patient's description of current relationship with people who raised him/her: good w mother, has always lived w her How were you disciplined when you got in trouble as a child/adolescent?: unable to assess Does patient have siblings?: Yes Number of Siblings: 2 Description of patient's current relationship with siblings: brother and sister, pretty good relationships. Did patient suffer any verbal/emotional/physical/sexual abuse as a child?: No Did patient suffer from severe  childhood neglect?: No Has patient ever been sexually abused/assaulted/raped as an adolescent or adult?: No Witnessed domestic violence?: No Has patient been effected by domestic violence as an adult?: No  Education: Highest grade of school patient has completed: 12th grade graduate at [employer] Currently a student?: No Learning disability?: No  Employment/Work Situation: Employment situation: disability, mental illness since 2018. 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  Financial Resources: Financial resources: Medicaid, Support from parents, SSI Does patient have a representative payee or guardian?: No  Alcohol/Substance Abuse: What has been your use of drugs/alcohol within the last 12 months?: mother reports no alcohol/drug use. If attempted suicide, did drugs/alcohol play a role in this?: No Alcohol/Substance Abuse Treatment Hx: Denies past history Has alcohol/substance abuse ever caused legal problems?: No  Social Support System: Forensic psychologistatient's Community Support System: good Museum/gallery exhibitions officerDescribe Community Support System: mother, community support, day program Type of faith/religion: unknown How does patient's faith help to cope with current illness?: na  Leisure/Recreation: Leisure and Hobbies: "I like to cook and eat spaghetti"  Strengths/Needs:   What is the patient's perception of their strengths?: "reading" Patient states they can use these personal strengths during their treatment to contribute to their recovery: Pt unable to answer this question.  Patient states these barriers may  affect/interfere with their treatment: none Patient states these barriers may affect their return to the community: none: mother  and CST team involved to support. Other important information patient would like considered in planning for their treatment: none  Discharge Plan:   Currently receiving community mental health services: Yes (From [facility])([facility] Community support team, [facility] for meds, [facility]Day program.) Patient states concerns and preferences for aftercare planning are: willing to continue with same providers. Patient states they will know when they are safe and ready for discharge when: pt unable to answer Does patient have access to transportation?: Yes Does patient have financial barriers related to discharge medications?: No Will patient be returning to same living situation after discharge?: Yes  Summary/Recommendations:   Summary and Recommendations (to be completed by the evaluator): Pt is 23 year old male from [place].  Pt is diagnosed with schizophrenia and was admitted due to decompensation related to being off medications for several months.  Pt had become non responsive, not talking, not eating, and hardly coming out of his room.  Recommendations for pt include crisis stabilization, therapeutic milieu, attend and participate in groups, medication management, and development of comprehensive mental wellness plan.   Lorri Frederick. 04/18/2018

## 2018-04-18 NOTE — Progress Notes (Signed)
Recreation Therapy Notes  Date: 2.4.20 Time: 1000 Location: 500 Hall Dayroom  Group Topic: Wellness  Goal Area(s) Addresses:  Patient will define components of whole wellness. Patient will verbalize benefit of whole wellness.  Behavioral Response: Engaged  Intervention: Exercise, Music  Activity: Exercise.  LRT introduced the concept of wellness to patients.  LRT and patients then engaged in a series of stretches and exercises.  Patients took turns leading group in exercises of their choosing.  Patients could take breaks and get water as needed.  Education: Wellness, Building control surveyorDischarge Planning.   Education Outcome: Acknowledges education/In group clarification offered/Needs additional education.   Clinical Observations/Feedback:  Pt was a little delayed in completing exercises with the rest of the group.  Pt eventually started doing his own exercises.  Pt appropriate and able to focus on completing the activity.    [name], LRT/CTRS         [name], [name] 04/18/2018 11:04 AM

## 2018-04-19 MED ORDER — BETHANECHOL CHLORIDE 10 MG PO TABS: 10.0000 mg | ORAL_TABLET | Freq: Every day | ORAL | Status: DC

## 2018-04-19 MED ORDER — BENZTROPINE MESYLATE 1 MG PO TABS
1.0000 mg | ORAL_TABLET | Freq: Two times a day (BID) | ORAL | Status: DC
  Administered 2018-04-19 – 2018-04-21 (×5): 1 mg via ORAL
  Filled 2018-04-19 (×8): qty 1

## 2018-04-19 NOTE — Progress Notes (Signed)
[facility]  04/19/2018 8:12 AM [name] Love  MRN:  [id number] Subjective:    In summary Mr. [name] Love was admitted through the emergency department on 1/31 for psychotic symptoms in the context of an undertreated psychotic disorder/paranoid schizophrenia, and a negative drug screen.  He initially presented with mutism. Since here he was initially guarded displaying several negative symptoms denying positive symptoms for the most part but received his Abilify Maintena on 2/2, with supplemental Haldol as well at 20 mg a day He has no EPS or TD He still gives some vague answers but he is much improved in mood and affect he is more coherent and thought. He denies current auditory and visual hallucinations, lacks full orientation however is much improved and denies wanting to harm self or others  Will discuss with team but my anticipation is he will probably go tomorrow  Principal Problem: Exacerbation of underlying psychotic disorder Diagnosis: Active Problems:   Schizophrenia, acute (HCC)  Total Time spent with patient: 20 minutes  Past Psychiatric History: Prior combination of Abilify oral and long-acting injectable  Past Medical History:  Past Medical History:  Diagnosis Date  . Schizophrenia, acute (HCC)    History reviewed. No pertinent surgical history. Family History:  Family History  Problem Relation Age of Onset  . Mental illness Cousin    Family Psychiatric  History: ukn Social History:  Social History   Substance and Sexual Activity  Alcohol Use Yes   Comment: a little      Social History   Substance and Sexual Activity  Drug Use No   Comment: denies    Social History   Socioeconomic History  . Marital status: Single    Spouse name: Not on file  . Number of children: Not on file  . Years of education: Not on file  . Highest education level: Not on file  Occupational History  . Not on file  Social Needs  . Financial resource strain: Not on file  . Food  insecurity:    Worry: Not on file    Inability: Not on file  . Transportation needs:    Medical: Not on file    Non-medical: Not on file  Tobacco Use  . Smoking status: Current Every Day Smoker    Packs/day: 0.00    Years: 4.00    Pack years: 0.00  . Smokeless tobacco: Never Used  Substance and Sexual Activity  . Alcohol use: Yes    Comment: a little   . Drug use: No    Comment: denies  . Sexual activity: Not Currently  Lifestyle  . Physical activity:    Days per week: Not on file    Minutes per session: Not on file  . Stress: Not on file  Relationships  . Social connections:    Talks on phone: Not on file    Gets together: Not on file    Attends religious service: Not on file    Active member of club or organization: Not on file    Attends meetings of clubs or organizations: Not on file    Relationship status: Not on file  Other Topics Concern  . Not on file  Social History Narrative  . Not on file   Additional Social History:                         Sleep: Good  Appetite:  Good  Current Medications: Current Facility-Administered Medications  Medication Dose  Route Frequency Provider Last Rate Last Dose  . acetaminophen (TYLENOL) tablet 650 mg  650 mg Oral Q6H PRN Nira Conn A, NP   650 mg at 04/18/18 0026  . alum & mag hydroxide-simeth (MAALOX/MYLANTA) 200-200-20 MG/5ML suspension 30 mL  30 mL Oral Q4H PRN Nira Conn A, NP      . benztropine (COGENTIN) tablet 0.5 mg  0.5 mg Oral BID Malvin Johns, MD   0.5 mg at 04/19/18 0802  . clonazePAM (KLONOPIN) tablet 1 mg  1 mg Oral BID Malvin Johns, MD   1 mg at 04/19/18 0801  . divalproex (DEPAKOTE) DR tablet 750 mg  750 mg Oral QHS Antonieta Pert, MD   750 mg at 04/18/18 2059  . feeding supplement (ENSURE ENLIVE) (ENSURE ENLIVE) liquid 237 mL  237 mL Oral BID BM Antonieta Pert, MD   237 mL at 04/18/18 0953  . haloperidol (HALDOL) tablet 10 mg  10 mg Oral QHS Malvin Johns, MD   10 mg at 04/18/18 2059   . haloperidol (HALDOL) tablet 5 mg  5 mg Oral BID Malvin Johns, MD   5 mg at 04/19/18 0801  . magnesium hydroxide (MILK OF MAGNESIA) suspension 30 mL  30 mL Oral Daily PRN Nira Conn A, NP      . traZODone (DESYREL) tablet 100 mg  100 mg Oral QHS,MR X 1 Antonieta Pert, MD   100 mg at 04/18/18 2059    Lab Results: No results found for this or any previous visit (from the past 48 hour(s)).  Blood Alcohol level:  Lab Results  Component Value Date   ETH <10 04/14/2018   ETH <10 03/21/2017    Metabolic Disorder Labs: Lab Results  Component Value Date   HGBA1C 5.4 07/15/2015   MPG 117 11/27/2014   Lab Results  Component Value Date   PROLACTIN 57.5 (H) 08/20/2015   PROLACTIN 23.2 (H) 02/04/2015   Lab Results  Component Value Date   CHOL 225 (H) 07/15/2015   TRIG 74 07/15/2015   HDL 58 07/15/2015   CHOLHDL 3.9 07/15/2015   VLDL 15 07/15/2015   LDLCALC 152 (H) 07/15/2015   LDLCALC 164 (H) 11/27/2014    Physical Findings: AIMS: Facial and Oral Movements Muscles of Facial Expression: None, normal Lips and Perioral Area: None, normal Jaw: None, normal Tongue: None, normal,Extremity Movements Upper (arms, wrists, hands, fingers): None, normal Lower (legs, knees, ankles, toes): None, normal, Trunk Movements Neck, shoulders, hips: None, normal, Overall Severity Severity of abnormal movements (highest score from questions above): None, normal Incapacitation due to abnormal movements: None, normal Patient's awareness of abnormal movements (rate only patient's report): No Awareness, Dental Status Current problems with teeth and/or dentures?: No Does patient usually wear dentures?: No  CIWA:  CIWA-Ar Total: 0 COWS:  COWS Total Score: 0  Musculoskeletal: Strength & Muscle Tone: within normal limits Gait & Station: normal Patient leans: N/A  Psychiatric Specialty Exam: Physical Exam no stiffness no cogwheel rigidity  ROS planing of incontinence at night  Blood pressure  115/68, pulse (!) 103, temperature 98.2 F (36.8 C), temperature source Oral, resp. rate 18, height 5\' 11"  (1.803 m), weight 66.7 kg, SpO2 99 %.Body mass index is 20.5 kg/m.  General Appearance: Casual  Eye Contact:  Fair  Speech:  Slow  Volume:  Decreased  Mood:  Euthymic  Affect:  Congruent and Flat  Thought Process:  Linear  Orientation:  Full (Time, Place, and Person)  Thought Content:  Tangential  Suicidal Thoughts:  No  Homicidal Thoughts:  No  Memory:  Immediate;   Fair  Judgement:  Fair  Insight:  Fair  Psychomotor Activity:  Normal  Concentration:  Concentration: Fair  Recall:  [employer]  Fund of Knowledge:  Fair  Language:  Fair  Akathisia:  Negative  Handed:  Right  AIMS (if indicated):     Assets:  Leisure Time Physical Health Social Support  ADL's:  Intact  Cognition:  WNL  Sleep:  Number of Hours: 6.25  To person place general situation day month and year not exact date   Treatment Plan Summary: Daily contact with patient to assess and evaluate symptoms and progress in treatment, Medication management and Plan Plans are to continue current precautions and meds and probable discharge tomorrow once arrangements are made no change in reality based therapy or precautions  Wenzel Backlund, MD 04/19/2018, 8:12 AM

## 2018-04-19 NOTE — Progress Notes (Signed)
D: Pt denies SI/HI/AVH. Pt is pleasant and cooperative. Pt continues to be disorganized and confused at times. Pt continues to be paranoid , especially with roommate. Pt  Has a hard time laying down in the bed if his roommate is lying down sleep , this has been an issue the past couple of nights , whichever one goes to sleep first the other will have a hard time falling asleep.   A: Pt was offered support and encouragement. Pt was given scheduled medications. Pt was encourage to attend groups. Q 15 minute checks were done for safety.   R:Pt attends groups and interacts  with peers and staff. Pt is taking medication. Pt receptive to treatment and safety maintained on unit.   Problem: Education: Goal: Mental status will improve Outcome: Not Progressing   Problem: Activity: Goal: Interest or engagement in activities will improve Outcome: Progressing

## 2018-04-19 NOTE — Therapy (Signed)
Occupational Therapy Group Note  Date:  04/19/2018 Time:  2:52 PM  Group Topic/Focus:  Leisure Group  Participation Level:  Active  Participation Quality:  Appropriate  Affect:  Flat  Cognitive:  Disorganized  Insight: Limited  Engagement in Group:  Off Topic  Modes of Intervention:  Activity, Discussion, Education and Socialization  Additional Comments:    S: "Making myself breakfast is a good healthy choice"  O: OT group focus on leisure, by facilitating engagement in task. Pt additionally challenged to recall stress management and coping skills. Pt to engage in game of uno, specific cards are chosen for pt to name a variety of coping skills. Pt asked to share variety of skills with group for continued coping skills education and review.  A: Pt presents with flat affect and disorganized thought patterns. Pt with off topic and bizarre responses to questions/statements. Pt appropriately engaged in [place] game, attempting to bend rules, but would look at OT and said "no" and correct himself. Pt responding in bizarre fashion when asked about coping skills. With cues, pt able to state that making breakfast is a healthy choice. 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  P: OT group will be once per week while pt inpatient.   Dalphine Handing, MSOT, OTR/L Behavioral Health OT/ Acute Relief OT PHP Office: [phone]  Dalphine Handing 04/19/2018, 2:52 PM

## 2018-04-19 NOTE — Plan of Care (Signed)
  Problem: Activity: Goal: Interest or engagement in activities will improve Outcome: Progressing   Problem: Safety: Goal: Periods of time without injury will increase Outcome: Progressing  DAR NOTE: Patient presents with anxious affect and depressed mood.  Denies suicidal thoughts, pain, auditory and visual hallucinations.  Described energy level as normal and concentration as good.  Rates depression at 1, hopelessness at 1, and anxiety at 2.  Maintained on routine safety checks.  Medications given as prescribed.  Support and encouragement offered as needed.  Attended group and participated.  States goal for today is "health."  Patient observed socializing with peers in the dayroom.  Offered no complaint.

## 2018-04-19 NOTE — Progress Notes (Signed)
Recreation Therapy Notes  Date: 2.5.20 Time: 1000 Location: 500 Hall Dayroom  Group Topic: Coping Skills  Goal Area(s) Addresses:  Pt will be able to identify consequences of using unhealthy coping skills. Pt will be able to identify healthy coping skills. Pt will be able to identify outcomes of using health coping skills.  Behavioral Response:  None  Intervention:  Worksheets, pencils  Activity: Healthy vs. Unhealthy Coping Strategies.  Patients were to read up on the difference between healthy and unhealthy coping skills.  Patients were to then identify the problem they are currently facing.  Patient then identified the negative coping skills they use and they consequences of those coping skills.  Patients would then identify healthy coping skills they could use, benefits and barriers that would prevent them from using the positive coping skills.  Education: Pharmacologist, Building control surveyor.   Education Outcome: Acknowledges understanding/In group clarification offered/Needs additional education.   Clinical Observations/Feedback: Pt was unable to focus and concentrate on activity.  Pt was staring down at his shirt the entire group.    [name], LRT/CTRS      [name], Myka Lukins A 04/19/2018 11:01 AM

## 2018-04-19 NOTE — [facility] Group Notes (Signed)
[facility] LCSW Group Therapy Note  Date/Time: 04/18/18, 1100  Type of Therapy/Topic:  Group Therapy:  Feelings about Diagnosis  Participation Level:  Active   Mood:pleasant   Description of Group:    This group will allow patients to explore their thoughts and feelings about diagnoses they have received. Patients will be guided to explore their level of understanding and acceptance of these diagnoses. Facilitator will encourage patients to process their thoughts and feelings about the reactions of others to their diagnosis, and will guide patients in identifying ways to discuss their diagnosis with significant others in their lives. This group will be process-oriented, with patients participating in exploration of their own experiences as well as giving and receiving support and challenge from other group members.   Therapeutic Goals: 1. Patient will demonstrate understanding of diagnosis as evidence by identifying two or more symptoms of the disorder:  2. Patient will be able to express two feelings regarding the diagnosis 3. Patient will demonstrate ability to communicate their needs through discussion and/or role plays  Summary of Patient Progress: Pt continues to not make much sense when he speaks.  Pt was engaged during group, often raising his hand to speak, however his comments were nonsensical and completely unrelated to the topic, discussion or question.  Pt lacked awareness that his comments were not making sense.          Therapeutic Modalities:   Cognitive Behavioral Therapy Brief Therapy Feelings Identification   Daleen Squibb, LCSW

## 2018-04-19 NOTE — Progress Notes (Signed)
Adult Psychoeducational Group Note  Date:  04/19/2018 Time:  9:02 PM  Group Topic/Focus:  Wrap-Up Group:   The focus of this group is to help patients review their daily goal of treatment and discuss progress on daily workbooks.  Participation Level:  Minimal  Participation Quality:  Inattentive  Affect:  Flat  Cognitive:  Disorganized  Insight: Lacking  Engagement in Group:  Engaged  Modes of Intervention:  Discussion  Additional Comments: The patient attend groups.The patient insight was lacking.  [name] 04/19/2018, 9:02 PM

## 2018-04-20 MED ORDER — CELECOXIB 100 MG PO CAPS
200.0000 mg | ORAL_CAPSULE | Freq: Two times a day (BID) | ORAL | Status: DC
  Administered 2018-04-21 (×2): 200 mg via ORAL
  Filled 2018-04-20 (×2): qty 2
  Filled 2018-04-20: qty 1
  Filled 2018-04-20 (×2): qty 2
  Filled 2018-04-20 (×3): qty 1

## 2018-04-20 MED ORDER — TRAZODONE HCL 100 MG PO TABS
200.0000 mg | ORAL_TABLET | Freq: Every day | ORAL | Status: DC
  Administered 2018-04-20: 200 mg via ORAL
  Filled 2018-04-20 (×3): qty 2

## 2018-04-20 NOTE — Plan of Care (Signed)
  Problem: Activity: Goal: Interest or engagement in activities will improve Outcome: Progressing   Problem: Safety: Goal: Periods of time without injury will increase Outcome: Progressing  DAR NOTE: Patient presents with paranoid behavior and mood.  Denies suicidal thoughts, pain, auditory and visual hallucinations.  Reports poor night sleep.  Described energy level as normal and concentration as good.  Rates depression at 0, hopelessness at 0, and anxiety at 0.  Maintained on routine safety checks.  Medications given as prescribed.  Support and encouragement offered as needed.  Attended group and participated.  Patient observed socializing with peers in the dayroom.  Offered no complaint.

## 2018-04-20 NOTE — Progress Notes (Signed)
Pt up and is visibly sleepy , but appears too paranoid to sleep while roommate is sleep in the room , pt given Tylenol per [facility] for generalized pain. Pt continues to be disorganized

## 2018-04-20 NOTE — Progress Notes (Signed)
[facility]  04/20/2018 10:05 AM [name] Love  MRN:  [id number] Subjective:    Patient showing more organization but still makes disjointed statements No thoughts of harming self or others Signs stable no EPS or TD  Discussed meds in detail some warnings are lost on the patient due to loss of executive functioning Particular side effects  Principal Problem: <principal problem not specified> Diagnosis: Active Problems:   Schizophrenia, acute (HCC)  Total Time spent with patient: 20 minutes  Past Medical History:  Past Medical History:  Diagnosis Date  . Schizophrenia, acute (HCC)    History reviewed. No pertinent surgical history. Family History:  Family History  Problem Relation Age of Onset  . Mental illness Cousin   Social History:  Social History   Substance and Sexual Activity  Alcohol Use Yes   Comment: a little      Social History   Substance and Sexual Activity  Drug Use No   Comment: denies    Social History   Socioeconomic History  . Marital status: Single    Spouse name: Not on file  . Number of children: Not on file  . Years of education: Not on file  . Highest education level: Not on file  Occupational History  . Not on file  Social Needs  . Financial resource strain: Not on file  . Food insecurity:    Worry: Not on file    Inability: Not on file  . Transportation needs:    Medical: Not on file    Non-medical: Not on file  Tobacco Use  . Smoking status: Current Every Day Smoker    Packs/day: 0.00    Years: 4.00    Pack years: 0.00  . Smokeless tobacco: Never Used  Substance and Sexual Activity  . Alcohol use: Yes    Comment: a little   . Drug use: No    Comment: denies  . Sexual activity: Not Currently  Lifestyle  . Physical activity:    Days per week: Not on file    Minutes per session: Not on file  . Stress: Not on file  Relationships  . Social connections:    Talks on phone: Not on file    Gets together: Not on file     Attends religious service: Not on file    Active member of club or organization: Not on file    Attends meetings of clubs or organizations: Not on file    Relationship status: Not on file  Other Topics Concern  . Not on file  Social History Narrative  . Not on file   Additional Social History:                         Sleep: Fair  Appetite:  Fair  Current Medications: Current Facility-Administered Medications  Medication Dose Route Frequency Provider Last Rate Last Dose  . acetaminophen (TYLENOL) tablet 650 mg  650 mg Oral Q6H PRN Nira ConnBerry, Jason A, NP   650 mg at 04/20/18 [id number]  . alum & mag hydroxide-simeth (MAALOX/MYLANTA) 200-200-20 MG/5ML suspension 30 mL  30 mL Oral Q4H PRN Nira ConnBerry, Jason A, NP      . benztropine (COGENTIN) tablet 1 mg  1 mg Oral BID Malvin JohnsFarah, Kirsi Hugh, MD   1 mg at 04/20/18 0758  . celecoxib (CELEBREX) capsule 200 mg  200 mg Oral BID Malvin JohnsFarah, Patryck Kilgore, MD      . clonazePAM [facility](KLONOPIN) tablet 1 mg  1 mg  Oral BID Malvin Johns, MD   1 mg at 04/20/18 0758  . divalproex (DEPAKOTE) DR tablet 750 mg  750 mg Oral QHS Antonieta Pert, MD   750 mg at 04/19/18 2057  . feeding supplement (ENSURE ENLIVE) (ENSURE ENLIVE) liquid 237 mL  237 mL Oral BID BM Antonieta Pert, MD   237 mL at 04/19/18 1427  . haloperidol (HALDOL) tablet 10 mg  10 mg Oral QHS Malvin Johns, MD   10 mg at 04/19/18 2057  . haloperidol (HALDOL) tablet 5 mg  5 mg Oral BID Malvin Johns, MD   5 mg at 04/20/18 0757  . magnesium hydroxide (MILK OF MAGNESIA) suspension 30 mL  30 mL Oral Daily PRN Nira Conn A, NP      . traZODone (DESYREL) tablet 100 mg  100 mg Oral QHS,MR X 1 Antonieta Pert, MD   100 mg at 04/19/18 2057    Lab Results: No results found for this or any previous visit (from the past 48 hour(s)).  Blood Alcohol level:  Lab Results  Component Value Date   ETH <10 04/14/2018   ETH <10 03/21/2017    Metabolic Disorder Labs: Lab Results  Component Value Date   HGBA1C 5.4  07/15/2015   MPG 117 11/27/2014   Lab Results  Component Value Date   PROLACTIN 57.5 (H) 08/20/2015   PROLACTIN 23.2 (H) 02/04/2015   Lab Results  Component Value Date   CHOL 225 (H) 07/15/2015   TRIG 74 07/15/2015   HDL 58 07/15/2015   CHOLHDL 3.9 07/15/2015   VLDL 15 07/15/2015   LDLCALC 152 (H) 07/15/2015   LDLCALC 164 (H) 11/27/2014    Physical Findings: AIMS: Facial and Oral Movements Muscles of Facial Expression: None, normal Lips and Perioral Area: None, normal Jaw: None, normal Tongue: None, normal,Extremity Movements Upper (arms, wrists, hands, fingers): None, normal Lower (legs, knees, ankles, toes): None, normal, Trunk Movements Neck, shoulders, hips: None, normal, Overall Severity Severity of abnormal movements (highest score from questions above): None, normal Incapacitation due to abnormal movements: None, normal Patient's awareness of abnormal movements (rate only patient's report): No Awareness, Dental Status Current problems with teeth and/or dentures?: No Does patient usually wear dentures?: No  CIWA:  CIWA-Ar Total: 0 COWS:  COWS Total Score: 0  Musculoskeletal: Strength & Muscle Tone: within normal limits Gait & Station: normal Patient leans: N/A  Psychiatric Specialty Exam: Physical Exam  ROS  Blood pressure 112/74, pulse 88, temperature 97.9 F (36.6 C), temperature source Oral, resp. rate 16, height 5\' 11"  (1.803 m), weight 66.7 kg, SpO2 99 %.Body mass index is 20.5 kg/m.  General Appearance: Casual  Eye Contact:  Good  Speech:  nl  Volume:  Decreased  Mood:  Euthymic  Affect:  Blunt  Thought Process:  Irrelevant  Orientation:  Full (Time, Place, and Person)  Thought Content:  Illogical and Tangential  Suicidal Thoughts:  No  Homicidal Thoughts:  No  Memory:  Immediate;   Fair  Judgement:  Impaired  Insight:  Fair  Psychomotor Activity:  Decreased  Concentration:  Concentration: Poor  Recall:  [employer] of Knowledge:  Fair   Language:  Fair  Akathisia:  Negative  Handed:  Right  AIMS (if indicated):     Assets:  Physical Health Resilience Social Support  ADL's:  Intact  Cognition:  WNL  Sleep:  Number of Hours: 3.5     Treatment Plan Summary: Daily contact with patient to assess and evaluate symptoms  and progress in treatment, Medication management and Plan Continue current antipsychotic therapy add sleep aid add Celebrex for treatment resistant psychosis continue cognitive based work as well as Forensic scientist based work  Malvin Johns, MD 04/20/2018, 10:05 AM

## 2018-04-20 NOTE — Progress Notes (Signed)
CSW spoke with Thalia Bloodgood at [facility], [phone].  They do med management as well and plan was for pt to get his medications there.  Their MD is there on Wed and Friday and they would take him for a walk in appt as soon as he is discharged.  CSW updated her that MD here at [facility] thinks pt needs more time on inpt unit and CSW agrees with this.  She asked that we stay in touch with discharge date. Dallas Schimke is other [facility] staff: [phone]. Garner Nash, MSW, LCSW Clinical Social Worker 04/20/2018 9:10 AM

## 2018-04-20 NOTE — Progress Notes (Signed)
Pt up wanting to sit in the dayroom , pt encouraged to go to bed and get sleep

## 2018-04-20 NOTE — Progress Notes (Signed)
Recreation Therapy Notes  Date: 2.6.20 Time: 1000 Location: 500 Hall Dayroom  Group Topic: Communication, Team Building, Problem Solving  Goal Area(s) Addresses:  Patient will effectively work with peer towards shared goal.  Patient will identify skill used to make activity successful.  Patient will identify how skills used during activity can be used to reach post d/c goals.   Behavioral Response:  None  Intervention: STEM Activity   Activity: [employer]. In teams, patients were asked to build the tallest freestanding tower possible out of 15 pipe cleaners. Systematically resources were removed, for example patient ability to use both hands and patient ability to verbally communicate.    Education: Pharmacist, community, Building control surveyor.   Education Outcome: Acknowledges education/In group clarification offered/Needs additional education.   Clinical Observations/Feedback:  Pt was sleep in group.    [name] Love, LRT/CTRS     [name], [name] A 04/20/2018 11:35 AM

## 2018-04-20 NOTE — Progress Notes (Signed)
D: Pt denies SI/HI/AVH. Pt is pleasant and cooperative. Pt continues to be disorganized with flight of ideas and loose associations. Pt continues to need prompting and much encouragement to take medications , but will take them. Pt appears very paranoid.   A: Pt was offered support and encouragement. Pt was given scheduled medications. Pt was encourage to attend groups. Q 15 minute checks were done for safety.   R:Pt attends groups and interacts  with peers and staff , at times. Pt is taking medication. Pt has no complaints.Pt receptive to treatment and safety maintained on unit.   Problem: Education: Goal: Mental status will improve Outcome: Progressing   Problem: Activity: Goal: Interest or engagement in activities will improve Outcome: Progressing   Problem: Coping: Goal: Ability to demonstrate self-control will improve Outcome: Progressing

## 2018-04-20 NOTE — Progress Notes (Signed)
Adult Psychoeducational Group Note  Date:  04/20/2018 Time:  8:44 PM  Group Topic/Focus:  Wrap-Up Group:   The focus of this group is to help patients review their daily goal of treatment and discuss progress on daily workbooks.  Participation Level:  Active  Participation Quality:  Appropriate  Affect:  Appropriate  Cognitive:  Appropriate  Insight: Appropriate  Engagement in Group:  Engaged  Modes of Intervention:  Discussion  Additional Comments: The patient expressed that he rates today a 8.The patient also said that he attended groups.  [name] 04/20/2018, 8:44 PM

## 2018-04-21 MED ORDER — HALOPERIDOL 5 MG PO TABS
20.0000 mg | ORAL_TABLET | Freq: Every day | ORAL | Status: DC
  Filled 2018-04-21 (×2): qty 4

## 2018-04-21 MED ORDER — ARIPIPRAZOLE ER 400 MG IM SRER: 400.0000 mg | INTRAMUSCULAR | 11 refills | Status: DC

## 2018-04-21 MED ORDER — HALOPERIDOL 20 MG PO TABS: 20.0000 mg | ORAL_TABLET | Freq: Every day | ORAL | 2 refills | Status: DC

## 2018-04-21 MED ORDER — OSELTAMIVIR PHOSPHATE 75 MG PO CAPS
75.0000 mg | ORAL_CAPSULE | Freq: Every day | ORAL | Status: DC
  Administered 2018-04-21: 75 mg via ORAL
  Filled 2018-04-21: qty 1
  Filled 2018-04-21: qty 9
  Filled 2018-04-21: qty 1

## 2018-04-21 MED ORDER — BENZTROPINE MESYLATE 1 MG PO TABS: 1.0000 mg | ORAL_TABLET | Freq: Two times a day (BID) | ORAL | 5 refills | Status: DC

## 2018-04-21 MED ORDER — DIVALPROEX SODIUM 250 MG PO DR TAB: 750.0000 mg | DELAYED_RELEASE_TABLET | Freq: Every day | ORAL | 2 refills | Status: DC

## 2018-04-21 NOTE — Progress Notes (Signed)
Recreation Therapy Notes  Date: 2.7.20 Time: 1000 Location: 500 Hall Dayroom  Group Topic: Communication, Team Building, Problem Solving  Goal Area(s) Addresses:  Patient will effectively work with peer towards shared goal.  Patient will identify skill used to make activity successful.  Patient will identify how skills used during activity can be used to reach post d/c goals.   Behavioral Response: Engaged  Intervention: STEM Activity   Activity: In team's, using 10 medium plastic cups, patients were asked to build the tallest free standing tower possible.    Education: Pharmacist, communityocial Skills, Building control surveyorDischarge Planning.   Education Outcome: Acknowledges education/In group clarification offered/Needs additional education.   Clinical Observations/Feedback: Pt started off observing the group.  Pt eventually joined in and was able to concentrate on the activity.  Pt was quiet but worked well with the group.  Pt stated he has a support system of 8 people and that he wanted to put his daycare worker on his list of people to call.      Caroll RancherMarjette Erilyn Pearman, LRT/CTRS      [name], Leiby Pigeon A 04/21/2018 12:05 PM

## 2018-04-21 NOTE — Progress Notes (Signed)
Patient discharged to lobby. Patient was stable and appreciative at that time. All papers, samples and prescriptions were given and valuables returned. Verbal understanding expressed. Denies SI/HI and A/VH. Patient given opportunity to express concerns and ask questions.  

## 2018-04-21 NOTE — Progress Notes (Signed)
Recreation Therapy Notes  INPATIENT RECREATION TR PLAN  Patient Details Name: [name] Love MRN: [id number] DOB: 12-23-1995 Today's Date: 04/21/2018  Rec Therapy Plan Is patient appropriate for Therapeutic Recreation?: Yes Treatment times per week: about 3 days Estimated Length of Stay: 5-7 days TR Treatment/Interventions: Group participation (Comment)  Discharge Criteria Pt will be discharged from therapy if:: Discharged Treatment plan/goals/alternatives discussed and agreed upon by:: Patient/family  Discharge Summary Short term goals set: See patient care plan Short term goals met: Complete Progress toward goals comments: Groups attended Which groups?: Communication, Coping skills, Wellness, Other (Comment)(Team building) Reason goals not met: None Therapeutic equipment acquired: N/A Reason patient discharged from therapy: Discharge from hospital Pt/family agrees with progress & goals achieved: Yes Date patient discharged from therapy: 04/21/18    Victorino Sparrow, LRT/CTRS  Ria Comment, [place] 04/21/2018, 1:05 PM

## 2018-04-21 NOTE — Progress Notes (Signed)
Progress note: Patient has been identified as potentially exposed to other patient with influenza. We will provide prophylaxis with tamiflu. 

## 2018-04-21 NOTE — Plan of Care (Signed)
Pt attended and engaged in recreation therapy group sessions.   [name], LRT/CTRS
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
## 2018-04-21 NOTE — Progress Notes (Signed)
  [facility] Adult Case Management Discharge Plan :  Will you be returning to the same living situation after discharge:  Yes,  with mother At discharge, do you have transportation home?: Yes,  mother Do you have the ability to pay for your medications: Yes,  medicaid  Release of information consent forms completed and in the chart;  Patient's signature needed at discharge.  Patient to Follow up at: Follow-up Information    [facility] Follow up on 04/26/2018.   Why:  Your community support team will transport you to your next medication appt on Wed, 04/26/18.  They will pick you up at 2:00pm. Contact information: [address] [place] [address] [phone]           Next level of care provider has access to [facility] Link:no  Safety Planning and Suicide Prevention discussed: Yes,  with mother     Has patient been referred to the Quitline?: Patient refused referral  Patient has been referred for addiction treatment: N/A  Lorri Frederick, LCSW 04/21/2018, 10:05 AM

## 2018-04-21 NOTE — [facility] Suicide Risk Assessment (Signed)
[facility] Discharge Suicide Risk Assessment   Principal Problem: Masturbation and underlying psychotic disorder  discharge Diagnoses: Active Problems:   Schizophrenia, acute (HCC)   Total Time spent with patient: 45 minutes  Currently alert oriented to person place time situation still makes a few disjointed statements but able to discuss his housing and denies auditory visual loose [place] denies thoughts of harming self or others no EPS no TD no involuntary movements Mental Status Per Nursing Assessment::   On Admission:  NA  Demographic Factors:  Male  Loss Factors: Decrease in vocational status  Historical Factors: NA  Risk Reduction Factors:   Sense of responsibility to family  Continued Clinical Symptoms:  Schizophrenia:   Paranoid or undifferentiated type  Cognitive Features That Contribute To Risk:  Loss of executive function    Suicide Risk:  Minimal: No identifiable suicidal ideation.  Patients presenting with no risk factors but with morbid ruminations; may be classified as minimal risk based on the severity of the depressive symptoms  Follow-up Information    [facility] Follow up on 04/27/2018.   Specialty:  Behavioral Health Why:  Hospital follow up appointment is 2/13 at 8:00a. Please bring your photo ID, proof of insurance, SSN, current medications, and discharge paperwork from this hospitalization.  Contact information: [address] [place] [address] [phone]           Plan Of Care/Follow-up recommendations:  Activity:  full  Satoshi Kalas, MD 04/21/2018, 9:41 AM
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
## 2019-12-11 ENCOUNTER — Other Ambulatory Visit: Payer: Self-pay

## 2019-12-11 ENCOUNTER — Encounter (HOSPITAL_BASED_OUTPATIENT_CLINIC_OR_DEPARTMENT_OTHER): Payer: Self-pay | Admitting: *Deleted

## 2019-12-11 ENCOUNTER — Emergency Department (HOSPITAL_BASED_OUTPATIENT_CLINIC_OR_DEPARTMENT_OTHER)
Admission: EM | Admit: 2019-12-11 | Discharge: 2019-12-11 | Disposition: A | Discharge status: Home / Self Care | Payer: Medicaid Other | Attending: Emergency Medicine | Admitting: Emergency Medicine

## 2019-12-11 DIAGNOSIS — Z91040 Latex allergy status: Secondary | ICD-10-CM | POA: Diagnosis not present

## 2019-12-11 DIAGNOSIS — M79644 Pain in right finger(s): Secondary | ICD-10-CM

## 2019-12-11 DIAGNOSIS — Z91010 Allergy to peanuts: Secondary | ICD-10-CM | POA: Diagnosis not present

## 2019-12-11 DIAGNOSIS — F17200 Nicotine dependence, unspecified, uncomplicated: Secondary | ICD-10-CM | POA: Insufficient documentation

## 2019-12-11 MED ORDER — BACITRACIN ZINC 500 UNIT/GM EX OINT
TOPICAL_OINTMENT | Freq: Once | CUTANEOUS | Status: AC
  Administered 2019-12-11: 1 via TOPICAL

## 2019-12-11 NOTE — ED Notes (Signed)
Ring given to mother with EDP in attendance

## 2019-12-11 NOTE — Discharge Instructions (Signed)
You can take Tylenol or Ibuprofen as directed for pain. You can alternate Tylenol and Ibuprofen every 4 hours. If you take Tylenol at 1pm, then you can take Ibuprofen at 5pm. Then you can take Tylenol again at 9pm.   Follow the RICE (Rest, Ice, Compression, Elevation) protocol as directed.    Return the emergency room for any worsening pain, redness or swelling of the finger, fevers or any other worsening concerning symptoms.

## 2019-12-11 NOTE — ED Notes (Signed)
Scrape to right ring finger cleansed with NS, pat dry and Bacitracin ointment applied and covered with band aid.

## 2019-12-11 NOTE — ED Triage Notes (Signed)
Pt put his mother's ring to his right ring finger. Ring is very small for pt's finger.  Right ring finger is swollen.

## 2019-12-11 NOTE — ED Provider Notes (Signed)
[facility] EMERGENCY DEPARTMENT Provider Note   CSN: [id number] Arrival date & time: 12/11/19  1234     History Chief Complaint  Patient presents with   Ring finger pain    [name] Love is a 24 y.o. male possible history of schizophrenia who presents for evaluation of right ring finger pain and swelling.  Mom reports that patient put on her ring and states that it got stuck there.  They have tried to remove it with soap and oil but were unsuccessful.  Patient states that he did this this morning.  Mom states she noticed it about 5 AM.  EM LEVEL 5 CAVEAT DUE TO HISTORY OF SCHIZOPHRENIA  The history is provided by a parent.       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 No pertinent surgical history.     Family History  Problem Relation Age of Onset   Mental illness Cousin     Social History   Tobacco Use   Smoking status: Current Every Day Smoker    Packs/day: 0.00    Years: 4.00    Pack years: 0.00   Smokeless tobacco: Never Used  Substance Use Topics   Alcohol use: Yes    Comment: a little    Drug use: No    Comment: denies    Home Medications Prior to Admission medications   Medication Sig Start Date End Date Taking? Authorizing Provider  ARIPiprazole ER (ABILIFY MAINTENA) 400 MG SRER injection Inject 2 mLs (400 mg total) into the muscle every 28 (twenty-eight) days. Due 05/15/18 04/21/18   Malvin Johns, MD  benztropine (COGENTIN) 1 MG tablet Take 1 tablet (1 mg total) by mouth 2 (two) times daily. 04/21/18   Malvin Johns, MD  divalproex (DEPAKOTE) 250 MG DR tablet Take 3 tablets (750 mg total)  by mouth at bedtime. 04/21/18   Malvin Johns, MD  haloperidol (HALDOL) 20 MG tablet Take 1 tablet (20 mg total) by mouth at bedtime. 04/21/18   Malvin Johns, MD  hydrOXYzine (ATARAX/VISTARIL) 25 MG tablet Take 1 tablet (25 mg total) by mouth 3 (three) times daily as needed for anxiety. Patient not taking: Reported on 04/14/2018 04/01/17   Armandina Stammer I, NP  traZODone (DESYREL) 100 MG tablet Take 1 tablet (100 mg total) by mouth at bedtime as needed for sleep. Patient not taking: Reported on 04/14/2018 04/01/17   Armandina Stammer I, NP    Allergies    Chocolate, Latex, Peanuts [peanut oil], and Tape  Review of Systems   Review of Systems  Unable to perform ROS: Psychiatric disorder    Physical Exam Updated Vital Signs BP 133/83 (BP Location: Left Arm)    Pulse 97    Temp 99.3 F (37.4 C) (Oral)    Resp 18    Ht 6' (1.829 m)    Wt 74.4 kg    SpO2 99%    BMI 22.24 kg/m   Physical Exam Vitals and nursing note reviewed.  Constitutional:      Appearance: He is well-developed.  HENT:     Head: Normocephalic and atraumatic.  Eyes:     General: No scleral icterus.  Right eye: No discharge.        Left eye: No discharge.     Conjunctiva/sclera: Conjunctivae normal.  Cardiovascular:     Pulses:          Radial pulses are 2+ on the right side and 2+ on the left side.  Pulmonary:     Effort: Pulmonary effort is normal.  Musculoskeletal:     Comments: Right ring finger with a ring in place.  There is some mild soft tissue swelling.  No warmth, erythema.  Skin:    General: Skin is warm and dry.     Comments: Good distal cap refill.  RUE is not dusky in appearance or cool to touch.  Neurological:     Mental Status: He is alert.  Psychiatric:        Speech: Speech normal.        Behavior: Behavior normal.     ED Results / Procedures / Treatments   Labs (all labs ordered are listed, but only abnormal results are displayed) Labs Reviewed - No data to display  EKG None  Radiology No  results found.  Procedures .Foreign Body Removal  Date/Time: 12/11/2019 1:17 PM Performed by: Maxwell Caul, PA-C Authorized by: Maxwell Caul, PA-C  Body area: skin General location: upper extremity Location details: right ring finger  Sedation: Patient sedated: no  Patient restrained: no Removal mechanism: ring cutter. Dressing: antibiotic ointment and dressing applied Complexity: simple 1 objects recovered. Objects recovered: ring Post-procedure assessment: foreign body removed Patient tolerance: patient tolerated the procedure well with no immediate complications Comments: I attempted to remove the ring with just lubrication but was unsuccessful given amount of swelling.  Ring cutter was used.  The ring was removed.  Are moving it, there is a small abrasion to the lateral aspect.  No deep wound noted.  Patient with good distal cap refill.   (including critical care time)  Medications Ordered in ED Medications  bacitracin ointment (has no administration in time range)    ED Course  I have reviewed the triage vital signs and the nursing notes.  Pertinent labs & imaging results that were available during my care of the patient were reviewed by me and considered in my medical decision making (see chart for details).    MDM Rules/Calculators/A&P                          24 year old male who presents for evaluation of ring stuck on his right fourth digit.  Mom states she noticed it about 5 AM this morning.  They attempted to remove it at home.  On initially arrival, he is afebrile, nontoxic-appearing.  Vital signs are stable.  On exam, his right fourth digit is slightly swollen with a ring in place.  Unable to remove it with surgical lube.  He is otherwise neurovascularly intact.  No signs infection.  Plan to attempt to remove it with ring cutter.  Foreign body removed as documented above.  Patient tolerated procedure well.  Once the ring was slightly removed, there  was a small abrasion noted to the finger.  No deep wound.  No signs of infection.  Encouraged at home supportive care measures.  Bacitracin and dressing applied. At this time, patient exhibits no emergent life-threatening condition that require further evaluation in ED or admission. Patient had ample opportunity for questions and discussion. All patient's questions were answered with full understanding. Strict return precautions discussed. Patient expresses  understanding and agreement to plan.   Portions of this note were generated with Scientist, clinical (histocompatibility and immunogenetics). Dictation errors may occur despite Haithcock attempts at proofreading.    Final Clinical Impression(s) / ED Diagnoses Final diagnoses:  Pain of finger of right hand    Rx / DC Orders ED Discharge Orders    None       Maxwell Caul, PA-C 12/11/19 1331    Curatolo, Adam, DO 12/11/19 1343

## 2019-12-11 NOTE — ED Notes (Signed)
ED Provider at bedside. 

## 2023-01-08 ENCOUNTER — Emergency Department (HOSPITAL_COMMUNITY)
Admission: EM | Admit: 2023-01-08 | Discharge: 2023-01-10 | Disposition: A | Discharge status: Home / Self Care | Payer: MEDICAID | Attending: Emergency Medicine | Admitting: Emergency Medicine

## 2023-01-08 ENCOUNTER — Encounter (HOSPITAL_COMMUNITY): Payer: Self-pay | Admitting: Emergency Medicine

## 2023-01-08 ENCOUNTER — Other Ambulatory Visit: Payer: Self-pay

## 2023-01-08 DIAGNOSIS — Z91010 Allergy to peanuts: Secondary | ICD-10-CM | POA: Insufficient documentation

## 2023-01-08 DIAGNOSIS — Z91040 Latex allergy status: Secondary | ICD-10-CM | POA: Diagnosis not present

## 2023-01-08 DIAGNOSIS — F1220 Cannabis dependence, uncomplicated: Secondary | ICD-10-CM | POA: Diagnosis present

## 2023-01-08 DIAGNOSIS — F29 Unspecified psychosis not due to a substance or known physiological condition: Secondary | ICD-10-CM | POA: Diagnosis not present

## 2023-01-08 DIAGNOSIS — R443 Hallucinations, unspecified: Secondary | ICD-10-CM

## 2023-01-08 DIAGNOSIS — F1410 Cocaine abuse, uncomplicated: Secondary | ICD-10-CM | POA: Diagnosis present

## 2023-01-08 DIAGNOSIS — Z79899 Other long term (current) drug therapy: Secondary | ICD-10-CM | POA: Insufficient documentation

## 2023-01-08 DIAGNOSIS — R456 Violent behavior: Secondary | ICD-10-CM | POA: Diagnosis present

## 2023-01-08 LAB — ACETAMINOPHEN LEVEL: Acetaminophen (Tylenol), Serum: <10 ug/mL — ABNORMAL LOW (ref 10–30)

## 2023-01-08 LAB — RAPID URINE DRUG SCREEN, HOSP PERFORMED
Amphetamines: NOT DETECTED
Barbiturates: NOT DETECTED
Benzodiazepines: NOT DETECTED
Cocaine: POSITIVE — AB
Opiates: NOT DETECTED
Tetrahydrocannabinol: POSITIVE — AB

## 2023-01-08 LAB — CBC
HCT: 36.5 % — ABNORMAL LOW (ref 39.0–52.0)
Hemoglobin: 12 g/dL — ABNORMAL LOW (ref 13.0–17.0)
MCH: 29.7 pg (ref 26.0–34.0)
MCHC: 32.9 g/dL (ref 30.0–36.0)
MCV: 90.3 fL (ref 80.0–100.0)
Platelets: 199 10*3/uL (ref 150–400)
RBC: 4.04 MIL/uL — ABNORMAL LOW (ref 4.22–5.81)
RDW: 13 % (ref 11.5–15.5)
WBC: 4.2 10*3/uL (ref 4.0–10.5)
nRBC: 0 % (ref 0.0–0.2)

## 2023-01-08 LAB — SALICYLATE LEVEL: Salicylate Lvl: <7 mg/dL — ABNORMAL LOW (ref 7.0–30.0)

## 2023-01-08 LAB — COMPREHENSIVE METABOLIC PANEL
ALT: 17 U/L (ref 0–44)
AST: 23 U/L (ref 15–41)
Albumin: 4.4 g/dL (ref 3.5–5.0)
Alkaline Phosphatase: 63 U/L (ref 38–126)
Anion gap: 7 (ref 5–15)
BUN: 11 mg/dL (ref 6–20)
CO2: 28 mmol/L (ref 22–32)
Calcium: 8.9 mg/dL (ref 8.9–10.3)
Chloride: 102 mmol/L (ref 98–111)
Creatinine, Ser: 0.87 mg/dL (ref 0.61–1.24)
GFR, Estimated: >60 mL/min (ref >60)
Glucose, Bld: 93 mg/dL (ref 70–99)
Potassium: 3.8 mmol/L (ref 3.5–5.1)
Sodium: 137 mmol/L (ref 135–145)
Total Bilirubin: 0.5 mg/dL (ref 0.3–1.2)
Total Protein: 7.7 g/dL (ref 6.5–8.1)

## 2023-01-08 LAB — ETHANOL: Alcohol, Ethyl (B): <10 mg/dL (ref <10)

## 2023-01-08 NOTE — BH Assessment (Addendum)
This patient has been referred to [facility] for his teleassessment.  RN Lamar Laundry was notified via secure messaging.  Lauren w/ [facility] will reach out with a time and a provider to see patient.
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
## 2023-01-08 NOTE — Consult Note (Incomplete)
[facility] Consult Note  Patient Name: [name] Love MRN: [id number] DOB: 1995/09/13 DATE OF Consult: 01/08/2023  PRIMARY PSYCHIATRIC DIAGNOSES  1.  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   3.    RECOMMENDATIONS  Recommendations: Medication recommendations: zyprexa 5mg  po at bedtime, haldol/ativan 5mg /2mg  po/IM if refused q6hrs prn extreme agitation Non-Medication/therapeutic recommendations: routine observation, increase to 1:1 if agitated Is inpatient psychiatric hospitalization recommended for this patient? Yes (Explain why): pt. demonstrating psychotic symptoms with recent aggressive behavior, likely decompensating Is another care setting recommended for this patient? (examples may include Crisis Stabilization Unit, Residential/Recovery Treatment, ALF/SNF, Memory Care Unit)  No (Explain why):   From a psychiatric perspective, is this patient appropriate for discharge to an outpatient setting/resource or other less restrictive environment for continued care?  No (Explain why): pt. Is likely to con Follow-Up Telepsychiatry C/L services: {Teles[facility]ych PP:[id number]} Communication: Treatment team members (and family members if applicable) who were involved in treatment/care discussions and planning, and with whom we spoke or engaged with via secure text/chat, include the following: ***  Thank you for involving [place] in the care of this patient. If you have any additional questions or concerns, please call [phone] and ask for me or the provider on-call.  TELEPSYCHIATRY ATTESTATION & CONSENT  As the provider for this telehealth consult, I attest that I verified the patient's identity using two separate identifiers, introduced myself to the patient, provided my credentials, disclosed my location, and performed this encounter via a HIPAA-compliant, real-time, face-to-face, two-way, interactive audio and video platform and with the full consent and agreement of the  patient (or guardian as applicable.)  Patient physical location: ED at [facility]. Telehealth provider physical location: home office in state of [place].  Video start time: 2200(Central Time) Video end time: 2250(Central Time)  IDENTIFYING DATA  [name] Love is a 27 y.o. year-old male for whom a psychiatric consultation has been ordered by the primary provider. The patient was identified using two separate identifiers.  CHIEF COMPLAINT/REASON FOR CONSULT  Erratic behavior  HISTORY OF PRESENT ILLNESS (HPI)  The patient ***.  PAST PSYCHIATRIC HISTORY  Pt. Denies having regular outpatient psychiatrist, denies inpatient treatment, reports hx of cogentin, abilify, depakote, "you know, Sprite and food, a little bit of snacks."  He reports hx of schizophrenia diagnosis, with which he does not agree.  Denies suicide attempts. Otherwise as per HPI above.  PAST MEDICAL HISTORY  Past Medical History:  Diagnosis Date  . Schizophrenia, acute (HCC)      HOME MEDICATIONS  PTA Medications  Medication Sig  . hydrOXYzine (ATARAX/VISTARIL) 25 MG tablet Take 1 tablet (25 mg total) by mouth 3 (three) times daily as needed for anxiety. (Patient not taking: Reported on 01/08/2023)  . traZODone (DESYREL) 100 MG tablet Take 1 tablet (100 mg total) by mouth at bedtime as needed for sleep. (Patient not taking: Reported on 01/08/2023)  . ARIPiprazole ER (ABILIFY MAINTENA) 400 MG SRER injection Inject 2 mLs (400 mg total) into the muscle every 28 (twenty-eight) days. Due 05/15/18 (Patient not taking: Reported on 01/08/2023)  . benztropine (COGENTIN) 1 MG tablet Take 1 tablet (1 mg total) by mouth 2 (two) times daily. (Patient not taking: Reported on 01/08/2023)  . divalproex (DEPAKOTE) 250 MG DR tablet Take 3 tablets (750 mg total) by mouth at bedtime. (Patient not taking: Reported on 01/08/2023)  . haloperidol (HALDOL) 20 MG tablet Take 1 tablet (20 mg total) by mouth at bedtime. (Patient not taking: Reported on  01/08/2023)  ALLERGIES  Allergies  Allergen Reactions  . Chocolate Rash  . Latex Rash and Other (See Comments)    Condoms only  . Other Rash and Other (See Comments)    ALL NUTS = RASHES  Permanent markers = mental status change  . Peanuts [Peanut Oil] Rash  . Tape Rash    SOCIAL & SUBSTANCE USE HISTORY  Social History   Socioeconomic History  . Marital status: Single    Spouse name: Not on file  . Number of children: Not on file  . Years of education: Not on file  . Highest education level: Not on file  Occupational History  . Not on file  Tobacco Use  . Smoking status: Every Day    Current packs/day: 0.00    Types: Cigarettes  . Smokeless tobacco: Never  Substance and Sexual Activity  . Alcohol use: Yes    Comment: a little   . Drug use: No    Comment: denies  . Sexual activity: Not Currently  Other Topics Concern  . Not on file  Social History Narrative  . Not on file   Social Determinants of Health   Financial Resource Strain: Not on file  Food Insecurity: Not on file  Transportation Needs: Not on file  Physical Activity: Not on file  Stress: Not on file  Social Connections: Not on file   Social History   Tobacco Use  Smoking Status Every Day  . Current packs/day: 0.00  . Types: Cigarettes  Smokeless Tobacco Never   Social History   Substance and Sexual Activity  Alcohol Use Yes   Comment: a little    Social History   Substance and Sexual Activity  Drug Use No   Comment: denies    Additional pertinent information: pt. denies substance use of any kind, including tobacco, though UDS is positive for cannabis, cocaine.  FAMILY HISTORY  Family History  Problem Relation Age of Onset  . Mental illness Cousin    Family Psychiatric History (if known):  "I don't know-no."    MENTAL STATUS EXAM (MSE)  Presentation  General Appearance: No data recorded Eye Contact:No data recorded Speech:No data recorded Speech Volume:No data  recorded Handedness:No data recorded  Mood and Affect  Mood:No data recorded Affect:No data recorded  Thought Process  Thought Processes:No data recorded Descriptions of Associations:No data recorded Orientation:No data recorded Thought Content:No data recorded History of Schizophrenia/Schizoaffective disorder:No data recorded Duration of Psychotic Symptoms:No data recorded Hallucinations:No data recorded Ideas of Reference:No data recorded Suicidal Thoughts:No data recorded Homicidal Thoughts:No data recorded  Sensorium  Memory:No data recorded Judgment:No data recorded Insight:No data recorded  Executive Functions  Concentration:No data recorded Attention Span:No data recorded Recall:No data recorded Fund of Knowledge:No data recorded Language:No data recorded  Psychomotor Activity  Psychomotor Activity:No data recorded  Assets  Assets:No data recorded  Sleep  Sleep:No data recorded  VITALS  Blood pressure (!) 145/85, pulse 99, temperature 98.3 F (36.8 C), temperature source Oral, resp. rate 16, SpO2 100%.  LABS  Admission on 01/08/2023  Component Date Value Ref Range Status  . Sodium 01/08/2023 137  135 - 145 mmol/L Final  . Potassium 01/08/2023 3.8  3.5 - 5.1 mmol/L Final  . Chloride 01/08/2023 102  98 - 111 mmol/L Final  . CO2 01/08/2023 28  22 - 32 mmol/L Final  . Glucose, Bld 01/08/2023 93  70 - 99 mg/dL Final   Glucose reference range applies only to samples taken after fasting for at least 8 hours.  Marland Kitchen  BUN 01/08/2023 11  6 - 20 mg/dL Final  . Creatinine, Ser 01/08/2023 0.87  0.61 - 1.24 mg/dL Final  . Calcium 57/32/2025 8.9  8.9 - 10.3 mg/dL Final  . Total Protein 01/08/2023 7.7  6.5 - 8.1 g/dL Final  . Albumin 42/70/6237 4.4  3.5 - 5.0 g/dL Final  . AST 62/83/1517 23  15 - 41 U/L Final  . ALT 01/08/2023 17  0 - 44 U/L Final  . Alkaline Phosphatase 01/08/2023 63  38 - 126 U/L Final  . Total Bilirubin 01/08/2023 0.5  0.3 - 1.2 mg/dL Final  . GFR,  Estimated 01/08/2023 >60  >60 mL/min Final   Comment: (NOTE) Calculated using the CKD-EPI Creatinine Equation (2021)   . Anion gap 01/08/2023 7  5 - 15 Final   Performed at [facility], [address] [place], [place] [address]  . Alcohol, Ethyl (B) 01/08/2023 <10  <10 mg/dL Final   Comment: (NOTE) Lowest detectable limit for serum alcohol is 10 mg/dL.  For medical purposes only. Performed at [facility], [address] [place], [place] [address]   . Salicylate Lvl 01/08/2023 <7.0 (L)  7.0 - 30.0 mg/dL Final   Performed at [facility], [address] [place], [place] [address]  . Acetaminophen (Tylenol), Serum 01/08/2023 <10 (L)  10 - 30 ug/mL Final   Comment: (NOTE) Therapeutic concentrations vary significantly. A range of 10-30 ug/mL  may be an effective concentration for many patients. However, some  are [place] treated at concentrations outside of this range. Acetaminophen concentrations >150 ug/mL at 4 hours after ingestion  and >50 ug/mL at 12 hours after ingestion are often associated with  toxic reactions.  Performed at [facility], [address], [place], [place] [address]   . WBC 01/08/2023 4.2  4.0 - 10.5 K/uL Final  . RBC 01/08/2023 4.04 (L)  4.22 - 5.81 MIL/uL Final  . Hemoglobin 01/08/2023 12.0 (L)  13.0 - 17.0 g/dL Final  . HCT 03/50/0938 36.5 (L)  39.0 - 52.0 % Final  . MCV 01/08/2023 90.3  80.0 - 100.0 fL Final  . MCH 01/08/2023 29.7  26.0 - 34.0 pg Final  . MCHC 01/08/2023 32.9  30.0 - 36.0 g/dL Final  . RDW 18/29/9371 13.0  11.5 - 15.5 % Final  . Platelets 01/08/2023 199  150 - 400 K/uL Final  . nRBC 01/08/2023 0.0  0.0 - 0.2 % Final   Performed at [facility], [address] [place], [place] [address]  . Opiates 01/08/2023 NONE DETECTED  NONE DETECTED Final  . Cocaine 01/08/2023 POSITIVE (A)  NONE DETECTED Final  . Benzodiazepines 01/08/2023 NONE DETECTED   NONE DETECTED Final  . Amphetamines 01/08/2023 NONE DETECTED  NONE DETECTED Final  . Tetrahydrocannabinol 01/08/2023 POSITIVE (A)  NONE DETECTED Final  . Barbiturates 01/08/2023 NONE DETECTED  NONE DETECTED Final   Comment: (NOTE) DRUG SCREEN FOR MEDICAL PURPOSES ONLY.  IF CONFIRMATION IS NEEDED FOR ANY PURPOSE, NOTIFY LAB WITHIN 5 DAYS.  LOWEST DETECTABLE LIMITS FOR URINE DRUG SCREEN Drug Class                     Cutoff (ng/mL) Amphetamine and metabolites    1000 Barbiturate and metabolites    200 Benzodiazepine                 200 Opiates and metabolites        300 Cocaine and metabolites  300 THC                            50 Performed at [facility], [address] [place], [place] [address]     PSYCHIATRIC REVIEW OF SYSTEMS (ROS)  ROS: Notable for the following relevant positive findings: ROS  Additional findings:      Musculoskeletal: {Musculoskeletal neeeds/assessment:304550014}      Gait & Station: {Gait and Station:304550016}      Pain Screening: {Pain Description:304550015}      Nutrition & Dental Concerns: {Nutrition & Dental Concerns:304550017}  RISK FORMULATION/ASSESSMENT  Is the patient experiencing any suicidal or homicidal ideations: {yes/no:20286}       Explain if yes: *** Protective factors considered for safety management: ***  Risk factors/concerns considered for safety management: *** {CHL BH Risk Factors Safety Management:304550011}  Is there a safety management plan with the patient and treatment team to minimize risk factors and promote protective factors: {yes/no:20286}           Explain: *** Is crisis care placement or psychiatric hospitalization recommended: {yes/no:20286}     Based on my current evaluation and risk assessment, patient is determined at this time to be at:  High risk  *RISK ASSESSMENT Risk assessment is a dynamic process; it is possible that this patient's condition, and risk level, may change. This  should be re-evaluated and managed over time as appropriate. Please re-consult psychiatric consult services if additional assistance is needed in terms of risk assessment and management. If your team decides to discharge this patient, please advise the patient how to [place] access emergency psychiatric services, or to call 911, if their condition worsens or they feel unsafe in any way.   Lavona Mound, MD Telepsychiatry Consult Services

## 2023-01-08 NOTE — Consult Note (Signed)
[facility] Consult Note  Patient Name: [name] Love MRN: [id number] DOB: 04-09-1995 DATE OF Consult: 01/08/2023  PRIMARY PSYCHIATRIC DIAGNOSES  1.  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   3.    RECOMMENDATIONS  Recommendations: Medication recommendations: zyprexa 5mg  po at bedtime, haldol/ativan 5mg /2mg  po/IM if refused q6hrs prn extreme agitation Non-Medication/therapeutic recommendations: routine observation, increase to 1:1 if agitated Is inpatient psychiatric hospitalization recommended for this patient? Yes (Explain why): pt. demonstrating psychotic symptoms with recent aggressive behavior, likely decompensating Is another care setting recommended for this patient? (examples may include Crisis Stabilization Unit, Residential/Recovery Treatment, ALF/SNF, Memory Care Unit)  No (Explain why):   From a psychiatric perspective, is this patient appropriate for discharge to an outpatient setting/resource or other less restrictive environment for continued care?  No (Explain why): pt. is likely to continue to decompensate without inpatient treatment, has recently been non-compliant with medications as an outpatient and demonstrates limited insight. Follow-Up Telepsychiatry C/L services: We will continue to follow this patient with you until stabilized or discharged.  If you have any questions or concerns, please call our TeleCare Coordination service at  [phone] and ask for myself or the provider on-call. Communication: Treatment team members (and family members if applicable) who were involved in treatment/care discussions and planning, and with whom we spoke or engaged with via secure text/chat, include the following: nursing staff via video chat  Thank you for involving [place] in the care of this patient. If you have any additional questions or concerns, please call [phone] and ask for me or the provider on-call.  TELEPSYCHIATRY ATTESTATION & CONSENT   As the provider for this telehealth consult, I attest that I verified the patient's identity using two separate identifiers, introduced myself to the patient, provided my credentials, disclosed my location, and performed this encounter via a HIPAA-compliant, real-time, face-to-face, two-way, interactive audio and video platform and with the full consent and agreement of the patient (or guardian as applicable.)  Patient physical location: ED at [facility]. Telehealth provider physical location: home office in state of [place].  Video start time: 2200(Central Time) Video end time: 2250(Central Time)  IDENTIFYING DATA  [name] Love is a 27 y.o. year-old male for whom a psychiatric consultation has been ordered by the primary provider. The patient was identified using two separate identifiers.  CHIEF COMPLAINT/REASON FOR CONSULT  Erratic behavior  HISTORY OF PRESENT ILLNESS (HPI)  The patient is a 27 y/o BM brought in by police after apparently acting in aggressive and erratic manor-mother called police.  Pt. reports he gets along with his mother well, demonstrates limited insight into the events of this evening.  He reports he's been noncompliant with medications, including long-acting injectables, for at least the last several months.  Denies sleep concerns.  He demonstrates disorganized and tangential speech intermittently.  Not oriented to date but is oriented to month, year, person, place.  He denies S/H ideation or A/V hallucinations and has no other concerns at this time.  PAST PSYCHIATRIC HISTORY  Pt. denies having regular outpatient psychiatrist, denies inpatient treatment, reports hx of cogentin, abilify, depakote, "you know, Sprite and food, a little bit of snacks."  He reports hx of schizophrenia diagnosis, with which he does not agree.  Denies suicide attempts. Otherwise as per HPI above.  PAST MEDICAL HISTORY  Past Medical History:  Diagnosis Date   Schizophrenia, acute (HCC)       HOME MEDICATIONS  PTA Medications  Medication Sig   hydrOXYzine (ATARAX/VISTARIL) 25 MG  tablet Take 1 tablet (25 mg total) by mouth 3 (three) times daily as needed for anxiety. (Patient not taking: Reported on 01/08/2023)   traZODone (DESYREL) 100 MG tablet Take 1 tablet (100 mg total) by mouth at bedtime as needed for sleep. (Patient not taking: Reported on 01/08/2023)   ARIPiprazole ER (ABILIFY MAINTENA) 400 MG SRER injection Inject 2 mLs (400 mg total) into the muscle every 28 (twenty-eight) days. Due 05/15/18 (Patient not taking: Reported on 01/08/2023)   benztropine (COGENTIN) 1 MG tablet Take 1 tablet (1 mg total) by mouth 2 (two) times daily. (Patient not taking: Reported on 01/08/2023)   divalproex (DEPAKOTE) 250 MG DR tablet Take 3 tablets (750 mg total) by mouth at bedtime. (Patient not taking: Reported on 01/08/2023)   haloperidol (HALDOL) 20 MG tablet Take 1 tablet (20 mg total) by mouth at bedtime. (Patient not taking: Reported on 01/08/2023)     ALLERGIES  Allergies  Allergen Reactions   Chocolate Rash   Latex Rash and Other (See Comments)    Condoms only   Other Rash and Other (See Comments)    ALL NUTS = RASHES  Permanent markers = mental status change   Peanuts [Peanut Oil] Rash   Tape Rash    SOCIAL & SUBSTANCE USE HISTORY  Social History   Socioeconomic History   Marital status: Single    Spouse name: Not on file   Number of children: Not on file   Years of education: Not on file   Highest education level: Not on file  Occupational History   Not on file  Tobacco Use   Smoking status: Every Day    Current packs/day: 0.00    Types: Cigarettes   Smokeless tobacco: Never  Substance and Sexual Activity   Alcohol use: Yes    Comment: a little    Drug use: No    Comment: denies   Sexual activity: Not Currently  Other Topics Concern   Not on file  Social History Narrative   Not on file   Social Determinants of Health   Financial Resource Strain: Not on  file  Food Insecurity: Not on file  Transportation Needs: Not on file  Physical Activity: Not on file  Stress: Not on file  Social Connections: Not on file   Social History   Tobacco Use  Smoking Status Every Day   Current packs/day: 0.00   Types: Cigarettes  Smokeless Tobacco Never   Social History   Substance and Sexual Activity  Alcohol Use Yes   Comment: a little    Social History   Substance and Sexual Activity  Drug Use No   Comment: denies    Additional pertinent information: pt. denies substance use of any kind, including tobacco, though UDS is positive for cannabis, cocaine.  FAMILY HISTORY  Family History  Problem Relation Age of Onset   Mental illness Cousin    Family Psychiatric History (if known):  "I don't know-no."    MENTAL STATUS EXAM (MSE)  Presentation  General Appearance: hospital gown, good grooming and hygiene Eye Contact: fair Speech: normal tone Speech Volume: normal   Mood and Affect  Mood: dysthymic Affect: blunted  Thought Process  Thought Processes: intermittently disorganized and tangential Descriptions of Associations: some loosening Orientation: oriented to month, year, person, place Thought Content: denies A/V hallucinations or delusions History of Schizophrenia/Schizoaffective disorder: yes, by records Duration of Psychotic Symptoms: unknown-pt. denies Hallucinations: pt. denies Ideas of Reference: pt. denies Suicidal Thoughts: pt. denies Homicidal Thoughts:  pt. denies  Sensorium  Memory: WNL Judgment: impaired Insight: impaired  Art therapist  Concentration: WNL Attention Span: fair Recall: fair Fund of Knowledge: fair Language: good  Psychomotor Activity  Psychomotor Activity: WNL  Assets  Assets: family support  Sleep  Sleep: reports no sleep issues  VITALS  Blood pressure (!) 145/85, pulse 99, temperature 98.3 F (36.8 C), temperature source Oral, resp. rate 16, SpO2 100%.  LABS  Admission on  01/08/2023  Component Date Value Ref Range Status   Sodium 01/08/2023 137  135 - 145 mmol/L Final   Potassium 01/08/2023 3.8  3.5 - 5.1 mmol/L Final   Chloride 01/08/2023 102  98 - 111 mmol/L Final   CO2 01/08/2023 28  22 - 32 mmol/L Final   Glucose, Bld 01/08/2023 93  70 - 99 mg/dL Final   Glucose reference range applies only to samples taken after fasting for at least 8 hours.   BUN 01/08/2023 11  6 - 20 mg/dL Final   Creatinine, Ser 01/08/2023 0.87  0.61 - 1.24 mg/dL Final   Calcium 78/46/9629 8.9  8.9 - 10.3 mg/dL Final   Total Protein 52/84/1324 7.7  6.5 - 8.1 g/dL Final   Albumin 40/12/2723 4.4  3.5 - 5.0 g/dL Final   AST 36/64/4034 23  15 - 41 U/L Final   ALT 01/08/2023 17  0 - 44 U/L Final   Alkaline Phosphatase 01/08/2023 63  38 - 126 U/L Final   Total Bilirubin 01/08/2023 0.5  0.3 - 1.2 mg/dL Final   GFR, Estimated 01/08/2023 >60  >60 mL/min Final   Comment: (NOTE) Calculated using the CKD-EPI Creatinine Equation (2021)    Anion gap 01/08/2023 7  5 - 15 Final   Performed at [facility], [address] [place], [place] [address]   Alcohol, Ethyl (B) 01/08/2023 <10  <10 mg/dL Final   Comment: (NOTE) Lowest detectable limit for serum alcohol is 10 mg/dL.  For medical purposes only. Performed at [facility], [address] [place], [place] [address]    Salicylate Lvl 01/08/2023 <7.0 (L)  7.0 - 30.0 mg/dL Final   Performed at [facility], [address] [place], [place] [address]   Acetaminophen (Tylenol), Serum 01/08/2023 <10 (L)  10 - 30 ug/mL Final   Comment: (NOTE) Therapeutic concentrations vary significantly. A range of 10-30 ug/mL  may be an effective concentration for many patients. However, some  are [place] treated at concentrations outside of this range. Acetaminophen concentrations >150 ug/mL at 4 hours after ingestion  and >50 ug/mL at 12 hours after ingestion are often associated with  toxic  reactions.  Performed at [facility], [address], [place], [place] [address]    WBC 01/08/2023 4.2  4.0 - 10.5 K/uL Final   RBC 01/08/2023 4.04 (L)  4.22 - 5.81 MIL/uL Final   Hemoglobin 01/08/2023 12.0 (L)  13.0 - 17.0 g/dL Final   HCT 84/16/6063 36.5 (L)  39.0 - 52.0 % Final   MCV 01/08/2023 90.3  80.0 - 100.0 fL Final   MCH 01/08/2023 29.7  26.0 - 34.0 pg Final   MCHC 01/08/2023 32.9  30.0 - 36.0 g/dL Final   RDW 01/60/1093 13.0  11.5 - 15.5 % Final   Platelets 01/08/2023 199  150 - 400 K/uL Final   nRBC 01/08/2023 0.0  0.0 - 0.2 % Final   Performed at [facility], [address], [place], [place] [address]   Opiates  01/08/2023 NONE DETECTED  NONE DETECTED Final   Cocaine 01/08/2023 POSITIVE (A)  NONE DETECTED Final   Benzodiazepines 01/08/2023 NONE DETECTED  NONE DETECTED Final   Amphetamines 01/08/2023 NONE DETECTED  NONE DETECTED Final   Tetrahydrocannabinol 01/08/2023 POSITIVE (A)  NONE DETECTED Final   Barbiturates 01/08/2023 NONE DETECTED  NONE DETECTED Final   Comment: (NOTE) DRUG SCREEN FOR MEDICAL PURPOSES ONLY.  IF CONFIRMATION IS NEEDED FOR ANY PURPOSE, NOTIFY LAB WITHIN 5 DAYS.  LOWEST DETECTABLE LIMITS FOR URINE DRUG SCREEN Drug Class                     Cutoff (ng/mL) Amphetamine and metabolites    1000 Barbiturate and metabolites    200 Benzodiazepine                 200 Opiates and metabolites        300 Cocaine and metabolites        300 THC                            50 Performed at [facility], [address] [place], [place] [address]     PSYCHIATRIC REVIEW OF SYSTEMS (ROS)    Additional findings:      Musculoskeletal: No abnormal movements observed      Gait & Station: Normal      Pain Screening: Denies      Nutrition & Dental Concerns: none  RISK FORMULATION/ASSESSMENT  Is the patient experiencing any suicidal or homicidal ideations: No        Protective factors considered for  safety management: family support  Risk factors/concerns considered for safety management:  Substance abuse/dependence Impulsivity Aggression Unwillingness to seek help Male gender Unmarried  Is there a safety management plan with the patient and treatment team to minimize risk factors and promote protective factors: Yes           Explain: inpatient treatment Is crisis care placement or psychiatric hospitalization recommended: Yes, involuntary placement is justified if patient is not voluntary.     Based on my current evaluation and risk assessment, patient is determined at this time to be at:  High risk  *RISK ASSESSMENT Risk assessment is a dynamic process; it is possible that this patient's condition, and risk level, may change. This should be re-evaluated and managed over time as appropriate. Please contact psychiatric consult services if additional assistance is needed in terms of risk assessment and management. If your team decides to discharge this patient, please advise the patient how to [place] access emergency psychiatric services, or to call 911, if their condition worsens or they feel unsafe in any way.   Lavona Mound, MD Telepsychiatry Consult Services

## 2023-01-08 NOTE — ED Notes (Addendum)
Pt changed into purple scrubs. Pt was given a urine cup to give urine sample. Pt stated he was not ready to give one yet. Pt belongings placed on top of cabinet 5-8

## 2023-01-09 MED ORDER — OLANZAPINE 5 MG PO TBDP
5.0000 mg | ORAL_TABLET | Freq: Once | ORAL | Status: AC
  Administered 2023-01-09: 5 mg via ORAL
  Filled 2023-01-09: qty 1

## 2023-01-09 MED ORDER — OLANZAPINE 10 MG PO TBDP
10.0000 mg | ORAL_TABLET | Freq: Three times a day (TID) | ORAL | Status: DC | PRN
  Administered 2023-01-10: 10 mg via ORAL
  Filled 2023-01-09: qty 1

## 2023-01-09 MED ORDER — NICOTINE 21 MG/24HR TD PT24
21.0000 mg | MEDICATED_PATCH | Freq: Once | TRANSDERMAL | Status: AC
  Administered 2023-01-09: 21 mg via TRANSDERMAL
  Filled 2023-01-09: qty 1

## 2023-01-09 MED ORDER — ZIPRASIDONE MESYLATE 20 MG IM SOLR: 20.0000 mg | INTRAMUSCULAR | Status: DC | PRN

## 2023-01-09 MED ORDER — OLANZAPINE 5 MG PO TBDP
5.0000 mg | ORAL_TABLET | Freq: Every day | ORAL | Status: DC
  Administered 2023-01-09: 5 mg via ORAL
  Filled 2023-01-09: qty 1

## 2023-01-09 MED ORDER — LORAZEPAM 1 MG PO TABS: 1.0000 mg | ORAL_TABLET | ORAL | Status: DC | PRN

## 2023-01-09 NOTE — Progress Notes (Signed)
Patient has been denied by [facility] due to no appropriate beds available. Patient meets BH inpatient criteria per Dahlia Byes, NP. Patient has been faxed out to the following facilities:  [facility] Pending - Request [address] [place] [place] [phone]--NGEXB-MWUXL [facility] Pending - Request Sent--[address] [place] [phone]--LOVFI-EPPIRJ [place] Pending - Request [place] [phone]--KGUR [facility] Pending - Request Sent--Outpatient Rehab [address] [place], [place] [phone]--GYIRS-WNIO [facility] Pending - Request Sent--[address], [place] [phone]--YBOFB-PZWCH [facility] Pending - Request Sent--[address] [place] [phone]--YPPJK-DTOIZTIW [facility] Pending - Request [address] [place] [phone]--KWIOX-BDZHGDJ [facility] Pending - Request [address] [place] [phone]--EYCXK-GYJEHUD [facility] Pending - Request [address] [place], [place] [phone]--VEHMC-NOBS [facility] Pending - Request Sent--[address], [place] [phone]--EXNTZ-GYFVCBS [facility] Pending - Request Sent--[address] [place] [facility] [phone]--[facility] Pending - Request [address] [place] [facility] [phone]--JQZES-PQZRAQ Health Pending - Request Sent--[address] [place] [phone]--GOTLX-BWIOMB Health-Behavioral Health Patient Placement Pending - Request Sent--[facility], [facility] [phone]--[facility] Pending  - Request Sent--[address] [place] [phone]--BVQXI-HWTUU [facility] Pending - Request [address] [place] [phone]--CCMB[facility] Pending - Request [address] [place] [phone]--VVZSM-OLMBEMLJ [facility] Pending - Request [address] [place] [phone]--  Damita Dunnings, MSW, LCSW-A  5:01 PM 01/09/2023

## 2023-01-09 NOTE — ED Notes (Signed)
Pt provided breakfast tray at this time. 

## 2023-01-09 NOTE — ED Provider Notes (Signed)
Emergency Medicine Observation Re-evaluation Note  [name] Love is a 27 y.o. male, seen on rounds today.  Pt initially presented to the ED for complaints of No chief complaint on file. Currently, the patient is sitting in the room.  Physical Exam  BP 120/67 (BP Location: Left Arm)   Pulse 66   Temp 98.4 F (36.9 C) (Oral)   Resp 12   SpO2 99%  Physical Exam General: resting comfortably, NAD Lungs: normal WOB Psych: currently calm and resting  ED Course / MDM  EKG:   I have reviewed the labs performed to date as well as medications administered while in observation.  Recent changes in the last 24 hours include evaluation by psychiatry and recommendation for inpatient psychiatric care.  Plan  Current plan is for inpatient psychiatric care and placement.    Rozelle Logan, DO 01/09/23 [id number]

## 2023-01-10 DIAGNOSIS — F1410 Cocaine abuse, uncomplicated: Secondary | ICD-10-CM | POA: Diagnosis present

## 2023-01-10 NOTE — Discharge Instructions (Signed)
Please follow up with your primary doctors. Return immediately if you develop fevers, chills, chest pain, shortness of breath, worsening mental health with thoughts of harming yourself or others.  Or increased hallucinations.  You may also return to the emergency department if develop any new or worsening symptoms that are concerning to you.

## 2023-01-10 NOTE — ED Notes (Signed)
Brother is supposed to come and pick him up at 5:30    IVC papers are being recinded per [facility]

## 2023-01-10 NOTE — ED Provider Notes (Signed)
Emergency Medicine Observation Re-evaluation Note  [name] Love is a 27 y.o. male, seen on rounds today.  Pt initially presented to the ED for complaints of No chief complaint on file. Currently, the patient is resting.  Physical Exam  BP 127/72   Pulse 67   Temp 98.1 F (36.7 C)   Resp 16   SpO2 98%  Physical Exam General: NAD Psych: No agitation  ED Course / MDM  EKG:   I have reviewed the labs performed to date as well as medications administered while in observation.  Recent changes in the last 24 hours include pt seen by telepsych and diagnosed with psychosis, plan for inpatient admission.  Plan  Current plan is for inpatient psychiatric admission.    Ernie Avena, MD 01/10/23 [phone]

## 2023-01-10 NOTE — ED Notes (Signed)
Patient willing took his medication to help calm himself down some

## 2023-01-10 NOTE — ED Notes (Signed)
Patient has came to the window multiple times wanting something else to eat   he seems to be getting more and more anxious

## 2023-01-10 NOTE — ED Notes (Addendum)
Patient's belongings placed in locker 38 by NT.
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
## 2023-01-10 NOTE — ED Notes (Signed)
Called his mother to check on where his brother was because he was supposed to pick him up at 5:30    mother said she was going to call and see where he was and to get over here to pick him up
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
## 2023-02-06 ENCOUNTER — Emergency Department (HOSPITAL_COMMUNITY)
Admission: EM | Admit: 2023-02-06 | Discharge: 2023-02-06 | Disposition: A | Discharge status: Home / Self Care | Payer: MEDICAID | Attending: Emergency Medicine | Admitting: Emergency Medicine

## 2023-02-06 ENCOUNTER — Other Ambulatory Visit: Payer: Self-pay

## 2023-02-06 ENCOUNTER — Encounter (HOSPITAL_COMMUNITY): Payer: Self-pay

## 2023-02-06 DIAGNOSIS — R4183 Borderline intellectual functioning: Secondary | ICD-10-CM

## 2023-02-06 DIAGNOSIS — R451 Restlessness and agitation: Secondary | ICD-10-CM | POA: Insufficient documentation

## 2023-02-06 DIAGNOSIS — Z91010 Allergy to peanuts: Secondary | ICD-10-CM | POA: Diagnosis not present

## 2023-02-06 DIAGNOSIS — R4689 Other symptoms and signs involving appearance and behavior: Secondary | ICD-10-CM | POA: Diagnosis present

## 2023-02-06 DIAGNOSIS — Z91040 Latex allergy status: Secondary | ICD-10-CM | POA: Diagnosis not present

## 2023-02-06 LAB — COMPREHENSIVE METABOLIC PANEL
ALT: 17 U/L (ref 0–44)
AST: 25 U/L (ref 15–41)
Albumin: 4.3 g/dL (ref 3.5–5.0)
Alkaline Phosphatase: 69 U/L (ref 38–126)
Anion gap: 6 (ref 5–15)
BUN: 7 mg/dL (ref 6–20)
CO2: 27 mmol/L (ref 22–32)
Calcium: 8.9 mg/dL (ref 8.9–10.3)
Chloride: 104 mmol/L (ref 98–111)
Creatinine, Ser: 0.96 mg/dL (ref 0.61–1.24)
GFR, Estimated: >60 mL/min (ref >60)
Glucose, Bld: 83 mg/dL (ref 70–99)
Potassium: 4 mmol/L (ref 3.5–5.1)
Sodium: 137 mmol/L (ref 135–145)
Total Bilirubin: 0.5 mg/dL (ref <1.2)
Total Protein: 7.6 g/dL (ref 6.5–8.1)

## 2023-02-06 LAB — CBC WITH DIFFERENTIAL/PLATELET
Abs Immature Granulocytes: 0 10*3/uL (ref 0.00–0.07)
Basophils Absolute: 0 10*3/uL (ref 0.0–0.1)
Basophils Relative: 1 %
Eosinophils Absolute: 0.3 10*3/uL (ref 0.0–0.5)
Eosinophils Relative: 6 %
HCT: 39 % (ref 39.0–52.0)
Hemoglobin: 13 g/dL (ref 13.0–17.0)
Immature Granulocytes: 0 %
Lymphocytes Relative: 51 %
Lymphs Abs: 2 10*3/uL (ref 0.7–4.0)
MCH: 30.7 pg (ref 26.0–34.0)
MCHC: 33.3 g/dL (ref 30.0–36.0)
MCV: 92.2 fL (ref 80.0–100.0)
Monocytes Absolute: 0.3 10*3/uL (ref 0.1–1.0)
Monocytes Relative: 9 %
Neutro Abs: 1.3 10*3/uL — ABNORMAL LOW (ref 1.7–7.7)
Neutrophils Relative %: 33 %
Platelets: 193 10*3/uL (ref 150–400)
RBC: 4.23 MIL/uL (ref 4.22–5.81)
RDW: 13.1 % (ref 11.5–15.5)
WBC: 3.9 10*3/uL — ABNORMAL LOW (ref 4.0–10.5)
nRBC: 0 % (ref 0.0–0.2)

## 2023-02-06 LAB — ETHANOL: Alcohol, Ethyl (B): <10 mg/dL (ref <10)

## 2023-02-06 LAB — SALICYLATE LEVEL: Salicylate Lvl: <7 mg/dL — ABNORMAL LOW (ref 7.0–30.0)

## 2023-02-06 LAB — ACETAMINOPHEN LEVEL: Acetaminophen (Tylenol), Serum: <10 ug/mL — ABNORMAL LOW (ref 10–30)

## 2023-02-06 NOTE — ED Provider Notes (Signed)
[facility] EMERGENCY DEPARTMENT AT [facility] Provider Note   CSN: [id number] Arrival date & time: 02/06/23  1358     History  Chief Complaint  Patient presents with   Psychiatric Evaluation    Mazen Faraone is a 27 y.o. male.  27 year old male with prior medical history as detailed below presents for evaluation.  Patient apparently brought here by his family.  Patient with history of schizophrenia.  Family reportedly concerned regarding patient's care at home.  Patient is reportedly fighting with his sibling, urinated on himself, kicking and bedroom doors.  Family is requesting group home placement.  Patient is calm and cooperative in the ED.  He is without current complaint.  He does not confirm any of the behaviors described by family.  The history is provided by the patient and medical records.       Home Medications Prior to Admission medications   Medication Sig Start Date End Date Taking? Authorizing Provider  ARIPiprazole ER (ABILIFY MAINTENA) 400 MG SRER injection Inject 2 mLs (400 mg total) into the muscle every 28 (twenty-eight) days. Due 05/15/18 Patient not taking: Reported on 01/08/2023 04/21/18   Malvin Johns, MD  benztropine (COGENTIN) 1 MG tablet Take 1 tablet (1 mg total) by mouth 2 (two) times daily. Patient not taking: Reported on 01/08/2023 04/21/18   Malvin Johns, MD  divalproex (DEPAKOTE) 250 MG DR tablet Take 3 tablets (750 mg total) by mouth at bedtime. Patient not taking: Reported on 01/08/2023 04/21/18   Malvin Johns, MD  haloperidol (HALDOL) 20 MG tablet Take 1 tablet (20 mg total) by mouth at bedtime. Patient not taking: Reported on 01/08/2023 04/21/18   Malvin Johns, MD  hydrOXYzine (ATARAX/VISTARIL) 25 MG tablet Take 1 tablet (25 mg total) by mouth 3 (three) times daily as needed for anxiety. Patient not taking: Reported on 01/08/2023 04/01/17   Armandina Stammer I, NP  traZODone (DESYREL) 100 MG tablet Take 1 tablet (100 mg total) by mouth at bedtime  as needed for sleep. Patient not taking: Reported on 01/08/2023 04/01/17   Armandina Stammer I, NP      Allergies    Chocolate, Latex, Other, Peanuts [peanut oil], and Tape    Review of Systems   Review of Systems  All other systems reviewed and are negative.   Physical Exam Updated Vital Signs BP (!) 139/99 (BP Location: Left Arm)   Pulse 75   Temp 98.5 F (36.9 C) (Oral)   Resp 16   Wt 74 kg   SpO2 100%   BMI 22.13 kg/m  Physical Exam Vitals and nursing note reviewed.  Constitutional:      General: He is not in acute distress.    Appearance: Normal appearance. He is well-developed.  HENT:     Head: Normocephalic and atraumatic.  Eyes:     Conjunctiva/sclera: Conjunctivae normal.     Pupils: Pupils are equal, round, and reactive to light.  Cardiovascular:     Rate and Rhythm: Normal rate and regular rhythm.     Heart sounds: Normal heart sounds.  Pulmonary:     Effort: Pulmonary effort is normal. No respiratory distress.     Breath sounds: Normal breath sounds.  Abdominal:     General: There is no distension.     Palpations: Abdomen is soft.     Tenderness: There is no abdominal tenderness.  Musculoskeletal:        General: No deformity. Normal range of motion.     Cervical back: Normal range  of motion and neck supple.  Skin:    General: Skin is warm and dry.  Neurological:     General: No focal deficit present.     Mental Status: He is alert and oriented to person, place, and time.     ED Results / Procedures / Treatments   Labs (all labs ordered are listed, but only abnormal results are displayed) Labs Reviewed - No data to display  EKG None  Radiology No results found.  Procedures Procedures    Medications Ordered in ED Medications - No data to display  ED Course/ Medical Decision Making/ A&P                                 Medical Decision Making Amount and/or Complexity of Data Reviewed Labs: ordered.    Medical Screen Complete  This  patient presented to the ED with complaint of agitation, unsafe behaviors.  This complaint involves an extensive number of treatment options. The initial differential diagnosis includes, but is not limited to, mental health evaluation  This presentation is: Chronic, Self-Limited, Previously Undiagnosed, Uncertain Prognosis, Complicated, Systemic Symptoms, and Threat to Life/Bodily Function  Patient with reported agitation, combative behavior, unsafe behaviors at home.  Patient brought to the ED for psychiatric evaluation.  Patient is calm and cooperative.  He is without medical complaint.  Screening labs obtained are without significant abnormality.    Patient is medically clear for TTS/psych evaluation.  Final disposition depending upon psychiatric/TTS evaluation.  Additional history obtained:  External records from outside sources obtained and reviewed including prior ED visits and prior Inpatient records.   Problem List / ED Course:  Agitation   Reevaluation:  After the interventions noted above, I reevaluated the patient and found that they have: improved    Disposition:  After consideration of the diagnostic results and the patients response to treatment, I feel that the patent would benefit from psychiatric evaluation, likely discharge.        Final Clinical Impression(s) / ED Diagnoses Final diagnoses:  Agitation    Rx / DC Orders ED Discharge Orders     None         Wynetta Fines, MD 02/06/23 2151

## 2023-02-06 NOTE — Discharge Summary (Signed)
[facility] Psych ED Discharge  02/06/2023 6:06 PM [name] Love  MRN:  [id number]  Principal Problem: Borderline intellectual functioning Discharge Diagnoses: Principal Problem:   Borderline intellectual functioning  Clinical Impression:  Final diagnoses:  Agitation   Subjective: [name], 27 years old with hx of  Borderline Intellectual functioning, Cannabis use disorder, severe dependence , Schizophrenia paranoid type brought in by his mother who want him to be placed in a group home. Patient was seen this evening calm ,cooperative and engaged in meaningful conversation.  Patient told me he came to the ER because of misunderstanding between him , his mother and brother.  He says there is poor communication in the house hold.  Patient states that he only takes Trazodone for sleep and cogentin after his monthly injection.  He said he received his monthly LAI Haldol recently but could not remember the date.  He admits that he smokes Marijuana daily and denies using any other drugs or Alcohol.  H e added that his mother is always wanting to send him to a group home.  Patient was in the ER last month for same request of placing him in a group home by his mother. Collateral information from mom [name] Love is that patient constantly argues and fights with his brother.  Patient urinates on himself and kicks on her door at times.  Mother states patient received his Monthly injection Haldol from [place] on the 19 th of November and that he willingly goes for his treatment.  Mother admits that he takes his Trazodone for sleep.  Mother admits that there are arguments in the house and that patient has not been aggressive and did not attack member of the family.  She just want to place him in a group home.  MS [name] Love was informed that ER does not process group home placement.  She was informed that she will have to initiate the process from home by visiting Social services department. Or involve the SW at [facility] to help  start the process.  Mother agrees with the plan. [name], well groomed, calm and cooperative brought to the ER for group home placement due to mother stating she cannot take care of him any longer.  Patient is in no distress, he is alert and oriented x4.  He named all of his Medications and his ACT TEAM.  He also stated that his mother is always wanting to place him in a group home.  We discussed the need to stop using Marijuana due to the effect on his mood.  He denies SI/HI/AVH.   We reviewed safety plan- go to [facility] for Mental health crisis- suicide thought or ideation, call 911 or 26 for  Mental health crisis or contact [facility] Mental health providers. Patient is Psychiatrically cleared.  DR Clovis Riley Psychiatrist is in agreement with discharge.  ED Assessment Time Calculation: Start Time: 1737 Stop Time: 1802 Total Time in Minutes (Assessment Completion): 25   Past Psychiatric History: Borderline Intellectual functioning, Cannabis use disorder, severe dependence , Schizophrenia paranoid type.  Receives care at [facility], Haldol LAI given November 19th.  Past Medical History:  Past Medical History:  Diagnosis Date   Schizophrenia, acute (HCC)    History reviewed. No pertinent surgical history. Family History:  Family History  Problem Relation Age of Onset   Mental illness Cousin    Family Psychiatric  History: denies Social History:  Social History   Substance and Sexual Activity  Alcohol Use Yes   Comment:  a little      Social History   Substance and Sexual Activity  Drug Use No   Comment: denies    Social History   Socioeconomic History   Marital status: Single    Spouse name: Not on file   Number of children: Not on file   Years of education: Not on file   Highest education level: Not on file  Occupational History   Not on file  Tobacco Use   Smoking status: Every Day    Current packs/day: 0.00    Types: Cigarettes   Smokeless tobacco: Never   Substance and Sexual Activity   Alcohol use: Yes    Comment: a little    Drug use: No    Comment: denies   Sexual activity: Not Currently  Other Topics Concern   Not on file  Social History Narrative   Not on file   Social Determinants of Health   Financial Resource Strain: Not on file  Food Insecurity: Not on file  Transportation Needs: Not on file  Physical Activity: Not on file  Stress: Not on file  Social Connections: Not on file    Tobacco Cessation:  N/A, patient does not currently use tobacco products  Current Medications: No current facility-administered medications for this encounter.   Current Outpatient Medications  Medication Sig Dispense Refill   ARIPiprazole ER (ABILIFY MAINTENA) 400 MG SRER injection Inject 2 mLs (400 mg total) into the muscle every 28 (twenty-eight) days. Due 05/15/18 (Patient not taking: Reported on 01/08/2023) 1 each 11   benztropine (COGENTIN) 1 MG tablet Take 1 tablet (1 mg total) by mouth 2 (two) times daily. (Patient not taking: Reported on 01/08/2023) 60 tablet 5   divalproex (DEPAKOTE) 250 MG DR tablet Take 3 tablets (750 mg total) by mouth at bedtime. (Patient not taking: Reported on 01/08/2023) 30 tablet 2   haloperidol (HALDOL) 20 MG tablet Take 1 tablet (20 mg total) by mouth at bedtime. (Patient not taking: Reported on 01/08/2023) 30 tablet 2   hydrOXYzine (ATARAX/VISTARIL) 25 MG tablet Take 1 tablet (25 mg total) by mouth 3 (three) times daily as needed for anxiety. (Patient not taking: Reported on 01/08/2023) 90 tablet 0   traZODone (DESYREL) 100 MG tablet Take 1 tablet (100 mg total) by mouth at bedtime as needed for sleep. (Patient not taking: Reported on 01/08/2023) 30 tablet 0   PTA Medications: (Not in a hospital admission)   [place] Scale:  Flowsheet Row ED from 02/06/2023 in [facility] Emergency Department at [facility] ED from 01/08/2023 in [facility] Emergency Department at [facility] Admission  (Discharged) [facility]  C-SSRS RISK CATEGORY No Risk No Risk No Risk       Musculoskeletal: Strength & Muscle Tone: within normal limits Gait & Station: normal Patient leans: Front  Psychiatric Specialty Exam: Presentation  General Appearance:  Casual; Well Groomed  Eye Contact: Good  Speech: Clear and Coherent; Normal Rate  Speech Volume: Normal  Handedness: Right   Mood and Affect  Mood: Euthymic  Affect: Congruent   Thought Process  Thought Processes: Coherent  Descriptions of Associations:Intact  Orientation:Partial  Thought Content:Logical  History of Schizophrenia/Schizoaffective disorder:No data recorded Duration of Psychotic Symptoms:No data recorded Hallucinations:Hallucinations: None  Ideas of Reference:None  Suicidal Thoughts:Suicidal Thoughts: No  Homicidal Thoughts:Homicidal Thoughts: No   Sensorium  Memory: Immediate Good; Recent Fair; Remote Fair  Judgment: Intact  Insight: Armed forces training and education officer  Concentration: Good  Attention Span: Good  Recall: Good  Fund of Knowledge: Good  Language: Good   Psychomotor Activity  Psychomotor Activity: Psychomotor Activity: Normal   Assets  Assets: Communication Skills; Housing; Social Support; Physical Health   Sleep  Sleep: Sleep: Good    Physical Exam: Physical Exam Vitals and nursing note reviewed.  Constitutional:      Appearance: Normal appearance. He is normal weight.  HENT:     Nose: Nose normal.  Cardiovascular:     Rate and Rhythm: Normal rate and regular rhythm.  Pulmonary:     Effort: Pulmonary effort is normal.  Musculoskeletal:        General: Normal range of motion.  Skin:    General: Skin is dry.  Neurological:     Mental Status: He is alert and oriented to person, place, and time.  Psychiatric:        Attention and Perception: Attention and perception normal.        Mood and  Affect: Mood normal.        Speech: Speech normal.        Behavior: Behavior normal. Behavior is cooperative.        Thought Content: Thought content normal.    Review of Systems  Constitutional: Negative.   HENT: Negative.    Eyes: Negative.   Respiratory: Negative.    Cardiovascular: Negative.   Gastrointestinal: Negative.   Genitourinary: Negative.   Musculoskeletal: Negative.   Skin: Negative.   Neurological: Negative.   Endo/Heme/Allergies: Negative.   Psychiatric/Behavioral:  Positive for substance abuse.    Blood pressure (!) 139/99, pulse 75, temperature 98.5 F (36.9 C), temperature source Oral, resp. rate 16, weight 74 kg, SpO2 100%. Body mass index is 22.13 kg/m.   Demographic Factors:  Male, Adolescent or young adult, Low socioeconomic status, and Unemployed  Loss Factors: NA  Historical Factors: NA  Risk Reduction Factors:   Living with another person, especially a relative and Positive social support  Continued Clinical Symptoms:  Alcohol/Substance Abuse/Dependencies  Cognitive Features That Contribute To Risk:  None    Suicide Risk:  Minimal: No identifiable suicidal ideation.  Patients presenting with no risk factors but with morbid ruminations; may be classified as minimal risk based on the severity of the depressive symptoms    Plan Of Care/Follow-up recommendations:  Activity:  as tolerated Diet:  Regular  Medical Decision Making: Patient does not meet criteria for inpatient Psychiatry hospitalization.  Patient denies SI/HI/AVH.  Patient received his monthly Injection November 19th and his   ACT team is [place].  Patient is Psychiatrically cleared.  Disposition: Psychiatrically cleared. Earney Navy, NP-PMHNP-BC 02/06/2023, 6:06 PM
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
## 2023-02-06 NOTE — Discharge Instructions (Signed)
Return for any problem.  ?

## 2023-02-11 ENCOUNTER — Encounter (HOSPITAL_COMMUNITY): Payer: Self-pay | Admitting: Emergency Medicine

## 2023-02-11 ENCOUNTER — Ambulatory Visit (HOSPITAL_COMMUNITY)
Admission: EM | Admit: 2023-02-11 | Discharge: 2023-02-12 | Disposition: A | Discharge status: Other Acute Inpatient Hospital | Payer: MEDICAID | Attending: Physician Assistant | Admitting: Physician Assistant

## 2023-02-11 DIAGNOSIS — R4183 Borderline intellectual functioning: Secondary | ICD-10-CM | POA: Diagnosis not present

## 2023-02-11 DIAGNOSIS — F250 Schizoaffective disorder, bipolar type: Secondary | ICD-10-CM | POA: Insufficient documentation

## 2023-02-11 LAB — CBC WITH DIFFERENTIAL/PLATELET
Abs Immature Granulocytes: 0.03 10*3/uL (ref 0.00–0.07)
Basophils Absolute: 0 10*3/uL (ref 0.0–0.1)
Basophils Relative: 1 %
Eosinophils Absolute: 0.2 10*3/uL (ref 0.0–0.5)
Eosinophils Relative: 5 %
HCT: 39.4 % (ref 39.0–52.0)
Hemoglobin: 12.8 g/dL — ABNORMAL LOW (ref 13.0–17.0)
Immature Granulocytes: 1 %
Lymphocytes Relative: 39 %
Lymphs Abs: 1.8 10*3/uL (ref 0.7–4.0)
MCH: 29.2 pg (ref 26.0–34.0)
MCHC: 32.5 g/dL (ref 30.0–36.0)
MCV: 90 fL (ref 80.0–100.0)
Monocytes Absolute: 0.4 10*3/uL (ref 0.1–1.0)
Monocytes Relative: 9 %
Neutro Abs: 2.1 10*3/uL (ref 1.7–7.7)
Neutrophils Relative %: 45 %
Platelets: 215 10*3/uL (ref 150–400)
RBC: 4.38 MIL/uL (ref 4.22–5.81)
RDW: 13.1 % (ref 11.5–15.5)
WBC: 4.6 10*3/uL (ref 4.0–10.5)
nRBC: 0 % (ref 0.0–0.2)

## 2023-02-11 LAB — POCT URINE DRUG SCREEN - MANUAL ENTRY (I-SCREEN)
POC Amphetamine UR: NOT DETECTED
POC Buprenorphine (BUP): NOT DETECTED
POC Cocaine UR: NOT DETECTED
POC Marijuana UR: POSITIVE — AB
POC Methadone UR: NOT DETECTED
POC Methamphetamine UR: NOT DETECTED
POC Morphine: NOT DETECTED
POC Oxazepam (BZO): NOT DETECTED
POC Oxycodone UR: NOT DETECTED
POC Secobarbital (BAR): NOT DETECTED

## 2023-02-11 LAB — LIPID PANEL
Cholesterol: 202 mg/dL — ABNORMAL HIGH (ref 0–200)
HDL: 83 mg/dL (ref >40)
LDL Cholesterol: 110 mg/dL — ABNORMAL HIGH (ref 0–99)
Total CHOL/HDL Ratio: 2.4 {ratio}
Triglycerides: 46 mg/dL (ref <150)
VLDL: 9 mg/dL (ref 0–40)

## 2023-02-11 LAB — HEMOGLOBIN A1C
Hgb A1c MFr Bld: 5.3 % (ref 4.8–5.6)
Mean Plasma Glucose: 105.41 mg/dL

## 2023-02-11 LAB — ETHANOL: Alcohol, Ethyl (B): <10 mg/dL (ref <10)

## 2023-02-11 MED ORDER — DIVALPROEX SODIUM 500 MG PO DR TAB
500.0000 mg | DELAYED_RELEASE_TABLET | Freq: Every day | ORAL | Status: DC
  Administered 2023-02-11: 500 mg via ORAL
  Filled 2023-02-11: qty 1

## 2023-02-11 MED ORDER — HYDROXYZINE HCL 25 MG PO TABS
25.0000 mg | ORAL_TABLET | Freq: Three times a day (TID) | ORAL | Status: DC | PRN
  Administered 2023-02-11: 25 mg via ORAL
  Filled 2023-02-11: qty 1

## 2023-02-11 MED ORDER — LORAZEPAM 1 MG PO TABS: 2.0000 mg | ORAL_TABLET | Freq: Four times a day (QID) | ORAL | Status: DC | PRN

## 2023-02-11 MED ORDER — TRAZODONE HCL 50 MG PO TABS
50.0000 mg | ORAL_TABLET | Freq: Every evening | ORAL | Status: DC | PRN
  Administered 2023-02-11: 50 mg via ORAL
  Filled 2023-02-11: qty 1

## 2023-02-11 MED ORDER — ACETAMINOPHEN 325 MG PO TABS
650.0000 mg | ORAL_TABLET | Freq: Four times a day (QID) | ORAL | Status: DC | PRN
  Administered 2023-02-12: 650 mg via ORAL
  Filled 2023-02-11: qty 2

## 2023-02-11 MED ORDER — HALOPERIDOL 5 MG PO TABS
5.0000 mg | ORAL_TABLET | Freq: Four times a day (QID) | ORAL | Status: DC | PRN
  Administered 2023-02-11: 5 mg via ORAL
  Filled 2023-02-11: qty 1

## 2023-02-11 MED ORDER — MAGNESIUM HYDROXIDE 400 MG/5ML PO SUSP: 30.0000 mL | Freq: Every day | ORAL | Status: DC | PRN

## 2023-02-11 MED ORDER — HALOPERIDOL LACTATE 5 MG/ML IJ SOLN: 5.0000 mg | Freq: Four times a day (QID) | INTRAMUSCULAR | Status: DC | PRN

## 2023-02-11 MED ORDER — LORAZEPAM 2 MG/ML IJ SOLN: 2.0000 mg | Freq: Four times a day (QID) | INTRAMUSCULAR | Status: DC | PRN

## 2023-02-11 MED ORDER — DIPHENHYDRAMINE HCL 50 MG PO CAPS: 50.0000 mg | ORAL_CAPSULE | Freq: Four times a day (QID) | ORAL | Status: DC | PRN

## 2023-02-11 MED ORDER — NICOTINE 7 MG/24HR TD PT24
7.0000 mg | MEDICATED_PATCH | Freq: Every day | TRANSDERMAL | Status: DC
  Administered 2023-02-11 – 2023-02-12 (×2): 7 mg via TRANSDERMAL
  Filled 2023-02-11 (×2): qty 1

## 2023-02-11 MED ORDER — DIPHENHYDRAMINE HCL 50 MG/ML IJ SOLN: 50.0000 mg | Freq: Four times a day (QID) | INTRAMUSCULAR | Status: DC | PRN

## 2023-02-11 MED ORDER — ALUM & MAG HYDROXIDE-SIMETH 200-200-20 MG/5ML PO SUSP: 30.0000 mL | ORAL | Status: DC | PRN
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
## 2023-02-11 NOTE — ED Notes (Signed)
Patient A&Ox4. Patient oriented to unit. Patient denies SI/HI and AVH. Patient denies any physical complaints when asked. No acute distress noted. Support and encouragement provided. Routine safety checks conducted according to facility protocol. Encouraged patient to notify staff if thoughts of harm toward self or others arise. Patient verbalize understanding and agreement. Will continue to monitor for safety.

## 2023-02-11 NOTE — ED Notes (Signed)
Patient resting quietly in bed with eyes closed. Respirations equal and unlabored, skin warm and dry, NAD. Routine safety checks conducted according to facility protocol. Will continue to monitor for safety.  

## 2023-02-11 NOTE — Progress Notes (Signed)
   02/11/23 1234  [facility] Triage Screening (Walk-ins at [facility] only)  How Did You Hear About [place]? Family/Friend  What Is the Reason for Your Visit/Call Today? Pt presents to [facility] voluntarily accompanied by family. Pt states that he gets killed every time he states what's wrong. Pt states that his body talks to him, such as his butt. Pt denies SI, HI, AH and alcohol use at this present time. Pt endorses VH, stating that he sees letters and smoking synthetic herbs. Pt states that we can [place] help him by providing him with liquids and blood work. Pt states that he doesn't have a therapist or psychiatrist.  How Long Has This Been Causing You Problems? <Week  Have You Recently Had Any Thoughts About Hurting Yourself? No  Are You Planning to Commit Suicide/Harm Yourself At This time? No  Have you Recently Had Thoughts About Hurting Someone Karolee Ohs? No  Are You Planning To Harm Someone At This Time? No  Physical Abuse Denies  Verbal Abuse Yes, present (Comment) (he says marijuana)  Sexual Abuse Denies  Exploitation of patient/patient's resources Denies  Self-Neglect Denies  Are you currently experiencing any auditory, visual or other hallucinations? Yes  Please explain the hallucinations you are currently experiencing: visual - sees letters  Have You Used Any Alcohol or Drugs in the Past 24 Hours? Yes  How long ago did you use Drugs or Alcohol? this morning  What Did You Use and How Much? synthetic herbs - 1 oz  Do you have any current medical co-morbidities that require immediate attention? No  Clinician description of patient physical appearance/behavior: thought blocking, cooperative, neatly dressed  What Do You Feel Would Help You the Most Today? Social Support;Medication(s);Treatment for Depression or other mood problem  If access to [facility] was not available, would you have sought care in the Emergency Department? No  Determination of Need Emergent (2 hours)  Options For Referral Inpatient  Hospitalization;Medication Management;Outpatient Therapy;Intensive Outpatient Therapy  Determination of Need filed? Yes

## 2023-02-11 NOTE — ED Notes (Signed)
Pt sleeping at present, no distress noted.  Monitoring for safety. 

## 2023-02-11 NOTE — ED Notes (Signed)
Pt A&O x 4, sleeping at present, no distress noted.  Monitoring for safety. 
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
## 2023-02-12 ENCOUNTER — Encounter (HOSPITAL_COMMUNITY): Payer: Self-pay

## 2023-02-12 ENCOUNTER — Emergency Department (HOSPITAL_COMMUNITY)
Admission: EM | Admit: 2023-02-12 | Discharge: 2023-02-12 | Disposition: A | Discharge status: Home / Self Care | Payer: MEDICAID | Attending: Emergency Medicine | Admitting: Emergency Medicine

## 2023-02-12 ENCOUNTER — Ambulatory Visit (HOSPITAL_COMMUNITY)
Admission: EM | Admit: 2023-02-12 | Discharge: 2023-02-14 | Disposition: A | Discharge status: Other Acute Inpatient Hospital | Payer: MEDICAID | Attending: Psychiatry | Admitting: Psychiatry

## 2023-02-12 ENCOUNTER — Other Ambulatory Visit: Payer: Self-pay

## 2023-02-12 ENCOUNTER — Emergency Department (HOSPITAL_COMMUNITY): Payer: MEDICAID

## 2023-02-12 DIAGNOSIS — R140 Abdominal distension (gaseous): Secondary | ICD-10-CM | POA: Insufficient documentation

## 2023-02-12 DIAGNOSIS — Z91040 Latex allergy status: Secondary | ICD-10-CM | POA: Diagnosis not present

## 2023-02-12 DIAGNOSIS — S0993XA Unspecified injury of face, initial encounter: Secondary | ICD-10-CM | POA: Diagnosis present

## 2023-02-12 DIAGNOSIS — S0240DA Maxillary fracture, left side, initial encounter for closed fracture: Secondary | ICD-10-CM | POA: Diagnosis not present

## 2023-02-12 DIAGNOSIS — Z23 Encounter for immunization: Secondary | ICD-10-CM | POA: Diagnosis not present

## 2023-02-12 DIAGNOSIS — F200 Paranoid schizophrenia: Secondary | ICD-10-CM | POA: Insufficient documentation

## 2023-02-12 MED ORDER — DIPHENHYDRAMINE HCL 50 MG/ML IJ SOLN: 50.0000 mg | Freq: Four times a day (QID) | INTRAMUSCULAR | Status: DC | PRN

## 2023-02-12 MED ORDER — TETANUS-DIPHTH-ACELL PERTUSSIS 5-2.5-18.5 LF-MCG/0.5 IM SUSY
0.5000 mL | PREFILLED_SYRINGE | Freq: Once | INTRAMUSCULAR | Status: AC
  Administered 2023-02-12: 0.5 mL via INTRAMUSCULAR
  Filled 2023-02-12: qty 0.5

## 2023-02-12 MED ORDER — LORAZEPAM 2 MG/ML IJ SOLN: 2.0000 mg | Freq: Four times a day (QID) | INTRAMUSCULAR | Status: DC | PRN

## 2023-02-12 MED ORDER — ALUM & MAG HYDROXIDE-SIMETH 200-200-20 MG/5ML PO SUSP: 30.0000 mL | ORAL | Status: DC | PRN

## 2023-02-12 MED ORDER — DIVALPROEX SODIUM 500 MG PO DR TAB
500.0000 mg | DELAYED_RELEASE_TABLET | Freq: Every day | ORAL | Status: DC
  Administered 2023-02-12 – 2023-02-13 (×2): 500 mg via ORAL
  Filled 2023-02-12 (×2): qty 1

## 2023-02-12 MED ORDER — ACETAMINOPHEN 325 MG PO TABS
650.0000 mg | ORAL_TABLET | Freq: Four times a day (QID) | ORAL | Status: DC | PRN
  Administered 2023-02-13: 650 mg via ORAL
  Filled 2023-02-12: qty 2

## 2023-02-12 MED ORDER — LORAZEPAM 1 MG PO TABS: 2.0000 mg | ORAL_TABLET | Freq: Four times a day (QID) | ORAL | Status: DC | PRN

## 2023-02-12 MED ORDER — HALOPERIDOL 5 MG PO TABS: 5.0000 mg | ORAL_TABLET | Freq: Three times a day (TID) | ORAL | Status: DC | PRN

## 2023-02-12 MED ORDER — HALOPERIDOL LACTATE 5 MG/ML IJ SOLN: 5.0000 mg | Freq: Three times a day (TID) | INTRAMUSCULAR | Status: DC | PRN

## 2023-02-12 MED ORDER — DIPHENHYDRAMINE HCL 50 MG PO CAPS: 50.0000 mg | ORAL_CAPSULE | Freq: Four times a day (QID) | ORAL | Status: DC | PRN

## 2023-02-12 MED ORDER — MAGNESIUM HYDROXIDE 400 MG/5ML PO SUSP: 30.0000 mL | Freq: Every day | ORAL | Status: DC | PRN
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
## 2023-02-12 NOTE — Progress Notes (Signed)
   02/12/23 1817  [facility] Triage Screening (Walk-ins at [facility] only)  How Did You Hear About [place]? Hospital Discharge  What Is the Reason for Your Visit/Call Today? Pt is being readmitted voluntarily back to [facility] after being medically cleared from [facility] ED.  How Long Has This Been Causing You Problems? <Week  Have You Recently Had Any Thoughts About Hurting Yourself? No  Are You Planning to Commit Suicide/Harm Yourself At This time? No  Have you Recently Had Thoughts About Hurting Someone Karolee Ohs? No  Are You Planning To Harm Someone At This Time? No  Physical Abuse Denies  Verbal Abuse Denies  Sexual Abuse Denies  Exploitation of patient/patient's resources Denies  Self-Neglect Denies  Are you currently experiencing any auditory, visual or other hallucinations? No  Have You Used Any Alcohol or Drugs in the Past 24 Hours? No  Do you have any current medical co-morbidities that require immediate attention? No  Clinician description of patient physical appearance/behavior: casually dressed, calm, cooperative  What Do You Feel Would Help You the Most Today? Social Support;Medication(s);Treatment for Depression or other mood problem  If access to [facility] was not available, would you have sought care in the Emergency Department? No  Determination of Need Routine (7 days)  Options For Referral Medication Management;Inpatient Hospitalization;Outpatient Therapy

## 2023-02-12 NOTE — ED Notes (Signed)
Attempted to call mother of pt for update, no answer at this time.

## 2023-02-12 NOTE — ED Notes (Signed)
PRN Tylenol given for 10/10 pain in nose. Patient in no acute distress, environment secured, safety checks in place per facility policy.
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
## 2023-02-12 NOTE — ED Notes (Signed)
Patient resting quietly in bed with eyes closed. Respirations equal and unlabored, skin warm and dry, NAD. Routine safety checks conducted according to facility protocol. Will continue to monitor for safety.  
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
## 2023-02-12 NOTE — ED Provider Notes (Signed)
Behavioral Health Progress Note  Date and Time: 02/12/2023 9:37 AM Name: [name] Love MRN:  [id number]  Subjective:  [name] Love, 27 y.o., male patient seen face to face by this provider, consulted with Dr. Dairl Ponder; and chart reviewed on 02/12/23.  On evaluation [name] Love reports he slept well.  When asked what brought him to the [facility], patient states "I see my grandma at Thanksgiving and I want to be like her".  Patient also states "my butt hurts and my left big toe hurts". When asked why they hurt, patient appears confused and says "I don't know".  He says he lives with his sister; but the chart indicates patient lives with his mother.     During evaluation [name] Love is standing in observation in no acute distress.  He is alert, oriented x 2, calm, cooperative and attentive.  His mood is euthymic with congruent affect.  He has clear speech that is rambling, and his behavior is bizarre.  Objectively there is evidence of psychosis and delusional thinking.  Patient is able to converse, but with distractibility and pre-occupation.  He denies suicidal/self-harm/homicidal ideation and paranoia.  Patient answered questions cooperatively.    Patient continues to meet criteria for inpatient psychiatric hospitalization.  Currently awaiting bed placement.   Diagnosis:  Final diagnoses:  Schizoaffective disorder, bipolar type (HCC)    Total Time spent with patient: 30 minutes  Past Psychiatric History: schizoaffective disorder (bipolar type) and borderline intellectual functioning  Past Medical History: None noted Family History: None noted Family Psychiatric  History: Cousins - mental illness Social History: Lives with family.  Additional Social History:    Pain Medications: See MAR Prescriptions: See MAR Over the Counter: See MAR History of alcohol / drug use?: Yes Longest period of sobriety (when/how long): Unknown Negative Consequences of Use: Personal relationships Withdrawal  Symptoms: None Name of Substance 1: Cannabis 1 - Age of First Use: Pt states he is unsure 1 - Amount (size/oz): Pt reports "maybe a blunt" 1 - Frequency: Daily 1 - Duration: Ongoing for the last month 1 - Last Use / Amount: Pt reports in the last 48 hours "a blunt" 1 - Method of Aquiring: Illegal 1- Route of Use: Smoking                  Sleep: Good  Appetite:  Good  Current Medications:  Current Facility-Administered Medications  Medication Dose Route Frequency Provider Last Rate Last Admin   acetaminophen (TYLENOL) tablet 650 mg  650 mg Oral Q6H PRN Nwoko, Uchenna E, PA       alum & mag hydroxide-simeth (MAALOX/MYLANTA) 200-200-20 MG/5ML suspension 30 mL  30 mL Oral Q4H PRN Nwoko, Uchenna E, PA       diphenhydrAMINE (BENADRYL) capsule 50 mg  50 mg Oral Q6H PRN Nwoko, Uchenna E, PA       Or   diphenhydrAMINE (BENADRYL) injection 50 mg  50 mg Intramuscular Q6H PRN Nwoko, Uchenna E, PA       divalproex (DEPAKOTE) DR tablet 500 mg  500 mg Oral QHS Nwoko, Uchenna E, PA   500 mg at 02/11/23 2227   haloperidol (HALDOL) tablet 5 mg  5 mg Oral Q6H PRN Nwoko, Uchenna E, PA   5 mg at 02/11/23 2227   Or   haloperidol lactate (HALDOL) injection 5 mg  5 mg Intramuscular Q6H PRN Nwoko, Uchenna E, PA       hydrOXYzine (ATARAX) tablet 25 mg  25 mg Oral TID PRN Karel Jarvis  E, PA   25 mg at 02/11/23 2227   LORazepam (ATIVAN) tablet 2 mg  2 mg Oral Q6H PRN Nwoko, Uchenna E, PA       Or   LORazepam (ATIVAN) injection 2 mg  2 mg Intramuscular Q6H PRN Nwoko, Uchenna E, PA       magnesium hydroxide (MILK OF MAGNESIA) suspension 30 mL  30 mL Oral Daily PRN Nwoko, Uchenna E, PA       nicotine (NICODERM CQ - dosed in mg/24 hr) patch 7 mg  7 mg Transdermal Daily Nwoko, Uchenna E, PA   7 mg at 02/11/23 1627   traZODone (DESYREL) tablet 50 mg  50 mg Oral QHS PRN Nwoko, Uchenna E, PA   50 mg at 02/11/23 2227   Current Outpatient Medications  Medication Sig Dispense Refill   benztropine (COGENTIN) 1  MG tablet Take 1 tablet (1 mg total) by mouth 2 (two) times daily. 60 tablet 5   divalproex (DEPAKOTE) 250 MG DR tablet Take 3 tablets (750 mg total) by mouth at bedtime. 30 tablet 2   traZODone (DESYREL) 100 MG tablet Take 1 tablet (100 mg total) by mouth at bedtime as needed for sleep. 30 tablet 0   ARIPiprazole ER (ABILIFY MAINTENA) 400 MG SRER injection Inject 2 mLs (400 mg total) into the muscle every 28 (twenty-eight) days. Due 05/15/18 1 each 11   haloperidol (HALDOL) 20 MG tablet Take 1 tablet (20 mg total) by mouth at bedtime. (Patient not taking: Reported on 01/08/2023) 30 tablet 2   hydrOXYzine (ATARAX/VISTARIL) 25 MG tablet Take 1 tablet (25 mg total) by mouth 3 (three) times daily as needed for anxiety. (Patient not taking: Reported on 01/08/2023) 90 tablet 0    Labs  Lab Results:  Admission on 02/11/2023  Component Date Value Ref Range Status   WBC 02/11/2023 4.6  4.0 - 10.5 K/uL Final   RBC 02/11/2023 4.38  4.22 - 5.81 MIL/uL Final   Hemoglobin 02/11/2023 12.8 (L)  13.0 - 17.0 g/dL Final   HCT 96/06/5407 39.4  39.0 - 52.0 % Final   MCV 02/11/2023 90.0  80.0 - 100.0 fL Final   MCH 02/11/2023 29.2  26.0 - 34.0 pg Final   MCHC 02/11/2023 32.5  30.0 - 36.0 g/dL Final   RDW 81/19/1478 13.1  11.5 - 15.5 % Final   Platelets 02/11/2023 215  150 - 400 K/uL Final   nRBC 02/11/2023 0.0  0.0 - 0.2 % Final   Neutrophils Relative % 02/11/2023 45  % Final   Neutro Abs 02/11/2023 2.1  1.7 - 7.7 K/uL Final   Lymphocytes Relative 02/11/2023 39  % Final   Lymphs Abs 02/11/2023 1.8  0.7 - 4.0 K/uL Final   Monocytes Relative 02/11/2023 9  % Final   Monocytes Absolute 02/11/2023 0.4  0.1 - 1.0 K/uL Final   Eosinophils Relative 02/11/2023 5  % Final   Eosinophils Absolute 02/11/2023 0.2  0.0 - 0.5 K/uL Final   Basophils Relative 02/11/2023 1  % Final   Basophils Absolute 02/11/2023 0.0  0.0 - 0.1 K/uL Final   Immature Granulocytes 02/11/2023 1  % Final   Abs Immature Granulocytes 02/11/2023  0.03  0.00 - 0.07 K/uL Final   Performed at [facility], [address], [place], [place] [address]   Hgb A1c MFr Bld 02/11/2023 5.3  4.8 - 5.6 % Final   Comment: (NOTE) Pre diabetes:          5.7%-6.4%  Diabetes:              >  6.4%  Glycemic control for   <7.0% adults with diabetes    Mean Plasma Glucose 02/11/2023 105.41  mg/dL Final   Performed at [facility], [address], [place], [place] [address]   Alcohol, Ethyl (B) 02/11/2023 <10  <10 mg/dL Final   Comment: (NOTE) Lowest detectable limit for serum alcohol is 10 mg/dL.  For medical purposes only. Performed at [facility], [address] [place], [place] [address]    Cholesterol 02/11/2023 202 (H)  0 - 200 mg/dL Final   Triglycerides 52/84/1324 46  <150 mg/dL Final   HDL 40/12/2723 83  >40 mg/dL Final   Total CHOL/HDL Ratio 02/11/2023 2.4  RATIO Final   VLDL 02/11/2023 9  0 - 40 mg/dL Final   LDL Cholesterol 02/11/2023 110 (H)  0 - 99 mg/dL Final   Comment:        Total Cholesterol/HDL:CHD Risk Coronary Heart Disease Risk Table                     Men   Women  1/2 Average Risk   3.4   3.3  Average Risk       5.0   4.4  2 X Average Risk   9.6   7.1  3 X Average Risk  23.4   11.0        Use the calculated Patient Ratio above and the CHD Risk Table to determine the patient's CHD Risk.        ATP III CLASSIFICATION (LDL):  <100     mg/dL   Optimal  366-440  mg/dL   Near or Above                    Optimal  130-159  mg/dL   Borderline  347-425  mg/dL   High  >956     mg/dL   Very High Performed at [facility], [address], [place], [place] [address]    POC Amphetamine UR 02/11/2023 None Detected  NONE DETECTED (Cut Off Level 1000 ng/mL) Final   POC Secobarbital (BAR) 02/11/2023 None Detected  NONE DETECTED (Cut Off Level 300 ng/mL) Final   POC Buprenorphine (BUP) 02/11/2023 None Detected  NONE DETECTED (Cut Off Level 10 ng/mL) Final   POC Oxazepam (BZO) 02/11/2023 None Detected   NONE DETECTED (Cut Off Level 300 ng/mL) Final   POC Cocaine UR 02/11/2023 None Detected  NONE DETECTED (Cut Off Level 300 ng/mL) Final   POC Methamphetamine UR 02/11/2023 None Detected  NONE DETECTED (Cut Off Level 1000 ng/mL) Final   POC Morphine 02/11/2023 None Detected  NONE DETECTED (Cut Off Level 300 ng/mL) Final   POC Methadone UR 02/11/2023 None Detected  NONE DETECTED (Cut Off Level 300 ng/mL) Final   POC Oxycodone UR 02/11/2023 None Detected  NONE DETECTED (Cut Off Level 100 ng/mL) Final   POC Marijuana UR 02/11/2023 Positive (A)  NONE DETECTED (Cut Off Level 50 ng/mL) Final  Admission on 02/06/2023, Discharged on 02/06/2023  Component Date Value Ref Range Status   WBC 02/06/2023 3.9 (L)  4.0 - 10.5 K/uL Final   RBC 02/06/2023 4.23  4.22 - 5.81 MIL/uL Final   Hemoglobin 02/06/2023 13.0  13.0 - 17.0 g/dL Final   HCT 43/32/9518 39.0  39.0 - 52.0 % Final   MCV 02/06/2023 92.2  80.0 - 100.0 fL Final   MCH 02/06/2023 30.7  26.0 - 34.0 pg Final   MCHC 02/06/2023 33.3  30.0 - 36.0 g/dL Final   RDW 11/91/4782 13.1  11.5 - 15.5 % Final   Platelets 02/06/2023 193  150 - 400 K/uL Final   nRBC 02/06/2023 0.0  0.0 - 0.2 % Final   Neutrophils Relative % 02/06/2023 33  % Final   Neutro Abs 02/06/2023 1.3 (L)  1.7 - 7.7 K/uL Final   Lymphocytes Relative 02/06/2023 51  % Final   Lymphs Abs 02/06/2023 2.0  0.7 - 4.0 K/uL Final   Monocytes Relative 02/06/2023 9  % Final   Monocytes Absolute 02/06/2023 0.3  0.1 - 1.0 K/uL Final   Eosinophils Relative 02/06/2023 6  % Final   Eosinophils Absolute 02/06/2023 0.3  0.0 - 0.5 K/uL Final   Basophils Relative 02/06/2023 1  % Final   Basophils Absolute 02/06/2023 0.0  0.0 - 0.1 K/uL Final   Immature Granulocytes 02/06/2023 0  % Final   Abs Immature Granulocytes 02/06/2023 0.00  0.00 - 0.07 K/uL Final   Performed at [facility], [address], [place], [place] [address]   Sodium 02/06/2023 137  135 - 145 mmol/L Final   Potassium  02/06/2023 4.0  3.5 - 5.1 mmol/L Final   Chloride 02/06/2023 104  98 - 111 mmol/L Final   CO2 02/06/2023 27  22 - 32 mmol/L Final   Glucose, Bld 02/06/2023 83  70 - 99 mg/dL Final   Glucose reference range applies only to samples taken after fasting for at least 8 hours.   BUN 02/06/2023 7  6 - 20 mg/dL Final   Creatinine, Ser 02/06/2023 0.96  0.61 - 1.24 mg/dL Final   Calcium 30/86/5784 8.9  8.9 - 10.3 mg/dL Final   Total Protein 69/62/9528 7.6  6.5 - 8.1 g/dL Final   Albumin 41/32/4401 4.3  3.5 - 5.0 g/dL Final   AST 02/72/5366 25  15 - 41 U/L Final   ALT 02/06/2023 17  0 - 44 U/L Final   Alkaline Phosphatase 02/06/2023 69  38 - 126 U/L Final   Total Bilirubin 02/06/2023 0.5  <1.2 mg/dL Final   GFR, Estimated 02/06/2023 >60  >60 mL/min Final   Comment: (NOTE) Calculated using the CKD-EPI Creatinine Equation (2021)    Anion gap 02/06/2023 6  5 - 15 Final   Performed at [facility], [address], [place], [place] [address]   Acetaminophen (Tylenol), Serum 02/06/2023 <10 (L)  10 - 30 ug/mL Final   Comment: (NOTE) Therapeutic concentrations vary significantly. A range of 10-30 ug/mL  may be an effective concentration for many patients. However, some  are [place] treated at concentrations outside of this range. Acetaminophen concentrations >150 ug/mL at 4 hours after ingestion  and >50 ug/mL at 12 hours after ingestion are often associated with  toxic reactions.  Performed at [facility], [address], [place], [place] [address]    Alcohol, Ethyl (B) 02/06/2023 <10  <10 mg/dL Final   Comment: (NOTE) Lowest detectable limit for serum alcohol is 10 mg/dL.  For medical purposes only. Performed at [facility], [address], [place], [place] [address]    Salicylate Lvl 02/06/2023 <7.0 (L)  7.0 - 30.0 mg/dL Final   Performed at [facility], [address], [place], [place] [address]  Admission on  01/08/2023, Discharged on 01/10/2023  Component Date Value Ref Range Status   Sodium 01/08/2023 137  135 - 145 mmol/L Final   Potassium 01/08/2023 3.8  3.5 - 5.1 mmol/L  Final   Chloride 01/08/2023 102  98 - 111 mmol/L Final   CO2 01/08/2023 28  22 - 32 mmol/L Final   Glucose, Bld 01/08/2023 93  70 - 99 mg/dL Final   Glucose reference range applies only to samples taken after fasting for at least 8 hours.   BUN 01/08/2023 11  6 - 20 mg/dL Final   Creatinine, Ser 01/08/2023 0.87  0.61 - 1.24 mg/dL Final   Calcium 16/12/9602 8.9  8.9 - 10.3 mg/dL Final   Total Protein 54/11/8117 7.7  6.5 - 8.1 g/dL Final   Albumin 14/78/2956 4.4  3.5 - 5.0 g/dL Final   AST 21/30/8657 23  15 - 41 U/L Final   ALT 01/08/2023 17  0 - 44 U/L Final   Alkaline Phosphatase 01/08/2023 63  38 - 126 U/L Final   Total Bilirubin 01/08/2023 0.5  0.3 - 1.2 mg/dL Final   GFR, Estimated 01/08/2023 >60  >60 mL/min Final   Comment: (NOTE) Calculated using the CKD-EPI Creatinine Equation (2021)    Anion gap 01/08/2023 7  5 - 15 Final   Performed at [facility], [address] [place], [place] [address]   Alcohol, Ethyl (B) 01/08/2023 <10  <10 mg/dL Final   Comment: (NOTE) Lowest detectable limit for serum alcohol is 10 mg/dL.  For medical purposes only. Performed at [facility], [address] [place], [place] [address]    Salicylate Lvl 01/08/2023 <7.0 (L)  7.0 - 30.0 mg/dL Final   Performed at [facility], [address] [place], [place] [address]   Acetaminophen (Tylenol), Serum 01/08/2023 <10 (L)  10 - 30 ug/mL Final   Comment: (NOTE) Therapeutic concentrations vary significantly. A range of 10-30 ug/mL  may be an effective concentration for many patients. However, some  are [place] treated at concentrations outside of this range. Acetaminophen concentrations >150 ug/mL at 4 hours after ingestion  and >50 ug/mL at 12 hours after ingestion are often  associated with  toxic reactions.  Performed at [facility], [address], [place], [place] [address]    WBC 01/08/2023 4.2  4.0 - 10.5 K/uL Final   RBC 01/08/2023 4.04 (L)  4.22 - 5.81 MIL/uL Final   Hemoglobin 01/08/2023 12.0 (L)  13.0 - 17.0 g/dL Final   HCT 72/53/6644 36.5 (L)  39.0 - 52.0 % Final   MCV 01/08/2023 90.3  80.0 - 100.0 fL Final   MCH 01/08/2023 29.7  26.0 - 34.0 pg Final   MCHC 01/08/2023 32.9  30.0 - 36.0 g/dL Final   RDW 03/47/4259 13.0  11.5 - 15.5 % Final   Platelets 01/08/2023 199  150 - 400 K/uL Final   nRBC 01/08/2023 0.0  0.0 - 0.2 % Final   Performed at [facility], [address], [place], [place] [address]   Opiates 01/08/2023 NONE DETECTED  NONE DETECTED Final   Cocaine 01/08/2023 POSITIVE (A)  NONE DETECTED Final   Benzodiazepines 01/08/2023 NONE DETECTED  NONE DETECTED Final   Amphetamines 01/08/2023 NONE DETECTED  NONE DETECTED Final   Tetrahydrocannabinol 01/08/2023 POSITIVE (A)  NONE DETECTED Final   Barbiturates 01/08/2023 NONE DETECTED  NONE DETECTED Final   Comment: (NOTE) DRUG SCREEN FOR MEDICAL PURPOSES ONLY.  IF CONFIRMATION IS NEEDED FOR ANY PURPOSE, NOTIFY LAB WITHIN 5 DAYS.  LOWEST DETECTABLE LIMITS FOR URINE DRUG SCREEN Drug Class  Cutoff (ng/mL) Amphetamine and metabolites    1000 Barbiturate and metabolites    200 Benzodiazepine                 200 Opiates and metabolites        300 Cocaine and metabolites        300 THC                            50 Performed at [facility], [address], [place], [place] [address]     Blood Alcohol level:  Lab Results  Component Value Date   [facility] <10 02/11/2023   ETH <10 02/06/2023    Metabolic Disorder Labs: Lab Results  Component Value Date   HGBA1C 5.3 02/11/2023   MPG 105.41 02/11/2023   MPG 117 11/27/2014   Lab Results  Component Value Date   PROLACTIN 57.5 (H) 08/20/2015   PROLACTIN 23.2  (H) 02/04/2015   Lab Results  Component Value Date   CHOL 202 (H) 02/11/2023   TRIG 46 02/11/2023   HDL 83 02/11/2023   CHOLHDL 2.4 02/11/2023   VLDL 9 02/11/2023   LDLCALC 110 (H) 02/11/2023   LDLCALC 152 (H) 07/15/2015    Therapeutic Lab Levels: No results found for: "LITHIUM" Lab Results  Component Value Date   VALPROATE 25 (L) 04/16/2018   VALPROATE <10 (L) 05/15/2016   Lab Results  Component Value Date   CBMZ 10.0 08/16/2015   CBMZ 8.2 08/10/2015    Physical Findings   AIMS    Flowsheet Row [facility]) [facility]charged[facility] Ad[facility] (Discharged) [facility] Ad[facility] (Discharged) [facility] ([facility]) [facility]  AIMS Total Score 0 0 0 0 0      AUDIT    Flowsheet Row [facility]) [facility] Ad[facility] (Discharged[facility] Ad[facility] (Discharged[facility] Ad[facility] (Discharged) [facility] Admission (Discharged) from 07/09/2015 in [facility]  Alcohol Use Disorder Identification Test Final Score (AUDIT) 0 0 1 1 0      Flowsheet Row ED from 02/11/2023 in [facility] ED from 02/06/2023 in [facility] at [facility] ED from 01/08/2023 in [facility] Department [facility]  C-SSRS RISK CATEGORY No Risk No Risk No Risk        Musculoskeletal  Strength & Muscle Tone: within normal limits Gait & Station: normal Patient leans: N/A  Psychiatric  Specialty Exam  Presentation  General Appearance:  Appropriate for Environment  Eye Contact: Fair  Speech: Clear and Coherent  Speech Volume: Normal  Handedness: Right   Mood and Affect  Mood: Euthymic  Affect: Congruent   Thought Process  Thought Processes: Disorganized  Descriptions of Associations:Loose  Orientation:Partial  Thought Content:Scattered  Diagnosis of Schizophrenia or Schizoaffective disorder in past: Yes  Duration of Psychotic Symptoms: Greater than six months   Hallucinations:Hallucinations: Auditory Description of Auditory Hallucinations: Patient reports the voices say "get out of my way"  Ideas of Reference:None  Suicidal Thoughts:Suicidal Thoughts: No  Homicidal Thoughts:Homicidal Thoughts: No  Sensorium  Memory: Immediate Fair; Recent Poor; Remote Poor  Judgment: Impaired  Insight: Poor   Executive Functions  Concentration: Fair  Attention Span: Fair  Recall: Fair  Fund of Knowledge: Fair  Language: Good   Psychomotor Activity  Psychomotor Activity: Psychomotor Activity: Normal   Assets  Assets: Communication Skills; Social Support; Physical Health; Leisure Time   Sleep  Sleep: Sleep: Good Number of Hours of Sleep: 8   Nutritional Assessment (For OBS and FBC admissions only) Has the patient had a weight loss or gain of 10 pounds or more in the last 3 months?: No Has the patient had a decrease in food intake/or appetite?: No Does the patient have dental problems?: No Does the patient have eating habits or behaviors that may be indicators of an eating disorder including binging or inducing vomiting?: No Has the patient recently lost weight without trying?: 0 Has the patient been eating poorly because of a decreased appetite?: 0 Malnutrition Screening Tool Score: 0    Physical Exam  Physical Exam Vitals and nursing note reviewed.  Eyes:     Pupils: Pupils are equal, round, and reactive to  light.  Pulmonary:     Effort: Pulmonary effort is normal.  Skin:    General: Skin is dry.  Neurological:     Mental Status: He is alert. He is disoriented.    Review of Systems  Psychiatric/Behavioral:  Positive for hallucinations and substance abuse.   All other systems reviewed and are negative.  Blood pressure 117/71, pulse 75, temperature 98.5 F (36.9 C), temperature source Oral, resp. rate 18, SpO2 100%. There is no height or weight on file to calculate BMI.  Treatment Plan Summary: Continue to await inpatient psychiatric hospitalization bed for stabilization and treatment  Thomes Lolling, NP 02/12/2023 9:37 AM
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
## 2023-02-12 NOTE — ED Notes (Signed)
Patient in bed resting. Rise and fall of the chest noted.
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
## 2023-02-12 NOTE — Progress Notes (Signed)
CSW requested [facility] Edythe Clarity, RN to review for inpatient [facility] placement. Pt meets inpatient behavioral health placement per Tad,MD. 1st shift Dispo CSW to follow up.  Maryjean Ka, MSW, [facility] 02/12/2023 12:54 AM
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
## 2023-02-12 NOTE — Progress Notes (Signed)
Patient has been denied by [facility] due to no appropriate beds available. Patient meets BH inpatient criteria per Phebe Colla, NP. Patient has been faxed out to the following facilities:   [facility]  [address] [place] [address] [phone]  [facility]  [address] [place] [address] [phone]  [facility] [address]  [address], [place] [address] [phone]  [facility] Patient Placement  [facility], [place] [phone]  [facility]  [address], [place] [address] [phone]  [facility] EFAX  [address] [place], [place] [phone]  [facility]  [address] [place] [address] [phone]  [facility]  [address], [place] [address] [phone]  [facility]  [address] [place] [address] [phone]  [facility]  [address] [place] [address] [phone]  [facility] [address]  [place] [address] [phone]  [facility]  [address] [facility] [place] [place] [address] [phone]  [facility]  [address] [place] [address] [phone]  [facility]  [address] [place], [place] [address] [phone]  [facility]  [address] [place] [address] [phone]  [facility]  [address], [place] [address] [phone]  [facility]  [address], [place] [address] [phone]  [facility]  [address], [place] [address] [phone]   Damita Dunnings, MSW, LCSW-A  12:41 PM 02/12/2023

## 2023-02-12 NOTE — ED Notes (Signed)
Snack was given. 

## 2023-02-12 NOTE — ED Notes (Signed)
Patient readmitted to obs from [facility] are being evaluated for nose swelling. Denies SI/H/AVH at this time. Denies any pain. Discharge paperwork placed in folder on unit. Paperwork states for patient to follow up with otolaryngology.

## 2023-02-12 NOTE — Progress Notes (Signed)
Patient was asked why he came to this facility and he responded, "I am not sure but it is because I eat too much and I need help with that."

## 2023-02-12 NOTE — ED Notes (Signed)
Pt sleeping at present, no distress noted,  Monitoring for safety. 

## 2023-02-13 MED ORDER — IBUPROFEN 600 MG PO TABS
600.0000 mg | ORAL_TABLET | Freq: Four times a day (QID) | ORAL | Status: DC | PRN
  Administered 2023-02-13: 600 mg via ORAL
  Filled 2023-02-13: qty 1
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
## 2023-02-13 NOTE — ED Notes (Signed)
Patient observed resting quietly, eyes closed. Respirations equal and unlabored. Will continue to monitor for safety.  

## 2023-02-13 NOTE — ED Notes (Signed)
Patient resting quietly in bed with eyes closed. Respirations equal and unlabored, skin warm and dry, NAD. Routine safety checks conducted according to facility protocol. Will continue to monitor for safety.  

## 2023-02-13 NOTE — ED Notes (Signed)
Patient observed resting quietly in the lounger. Pt is in no acute distress. No needs noted at this time. Will continue to monitor for safety.

## 2023-02-13 NOTE — ED Provider Notes (Signed)
Behavioral Health Progress Note  Date and Time: 02/13/2023 8:18 AM Name: [name] Love MRN:  [id number]  Subjective:  [name] Love, 27 y.o., male patient seen face to face by this provider, consulted with Dr. Dairl Ponder; and chart reviewed on 02/13/23.  On evaluation [name] Love reports "I'm feeling good today." Patient says he slept well and that the voices are quieter today.  In addition, he says his nose feels better.  Patient continues to answer some questions nonsensically.  He is calm and pleasant throughout interview.    Patient remains acutely psychotic and continues to meet criteria for inpatient psychiatric hospitalization for stabilization and treatment.     During evaluation [name] Love is laying on the pull out chair in no acute distress.  He is alert, oriented x 2, calm, cooperative and attentive.  His mood is euthymic with congruent affect.  He has normal speech, and behavior.  Objectively there is evidence of psychosis and delusional thinking.  Patient is able to converse coherently with goal directed thoughts.  He denies suicidal/self-harm/homicidal ideation and endorses psychosis.  Patient answered questions cooperatively.   Diagnosis:  Final diagnoses:  Schizophrenia, paranoid type (HCC)    Total Time spent with patient: 20 minutes  Past Psychiatric History: schizoaffective disorder (bipolar type) and borderline intellectual functioning  Past Medical History: None noted Family History: None noted Family Psychiatric  History: Cousins - mental illness Social History: Lives with family  Additional Social History:                         Sleep: Good  Appetite:  Good  Current Medications:  Current Facility-Administered Medications  Medication Dose Route Frequency Provider Last Rate Last Admin   acetaminophen (TYLENOL) tablet 650 mg  650 mg Oral Q6H PRN Saadiq Poche A, NP   650 mg at 02/13/23 0816   alum & mag hydroxide-simeth (MAALOX/MYLANTA) 200-200-20 MG/5ML  suspension 30 mL  30 mL Oral Q4H PRN Georgena Weisheit A, NP       diphenhydrAMINE (BENADRYL) capsule 50 mg  50 mg Oral Q6H PRN Rayelynn Loyal A, NP       Or   diphenhydrAMINE (BENADRYL) injection 50 mg  50 mg Intramuscular Q6H PRN Addisen Chappelle A, NP       divalproex (DEPAKOTE) DR tablet 500 mg  500 mg Oral QHS Quenten Nawaz A, NP   500 mg at 02/12/23 2110   haloperidol (HALDOL) tablet 5 mg  5 mg Oral Q8H PRN Arnika Larzelere A, NP       Or   haloperidol lactate (HALDOL) injection 5 mg  5 mg Intramuscular Q8H PRN Shaylyn Bawa A, NP       LORazepam (ATIVAN) tablet 2 mg  2 mg Oral Q6H PRN Tanaia Hawkey A, NP       Or   LORazepam (ATIVAN) injection 2 mg  2 mg Intramuscular Q6H PRN Arryn Terrones A, NP       magnesium hydroxide (MILK OF MAGNESIA) suspension 30 mL  30 mL Oral Daily PRN Reverie Vaquera A, NP       Current Outpatient Medications  Medication Sig Dispense Refill   ARIPiprazole ER (ABILIFY MAINTENA) 400 MG SRER injection Inject 2 mLs (400 mg total) into the muscle every 28 (twenty-eight) days. Due 05/15/18 1 each 11   benztropine (COGENTIN) 1 MG tablet Take 1 tablet (1 mg total) by mouth 2 (two) times daily. 60 tablet 5   divalproex (DEPAKOTE) 250 MG DR tablet Take  3 tablets (750 mg total) by mouth at bedtime. 30 tablet 2   haloperidol (HALDOL) 20 MG tablet Take 1 tablet (20 mg total) by mouth at bedtime. (Patient not taking: Reported on 01/08/2023) 30 tablet 2   hydrOXYzine (ATARAX/VISTARIL) 25 MG tablet Take 1 tablet (25 mg total) by mouth 3 (three) times daily as needed for anxiety. (Patient not taking: Reported on 01/08/2023) 90 tablet 0   traZODone (DESYREL) 100 MG tablet Take 1 tablet (100 mg total) by mouth at bedtime as needed for sleep. 30 tablet 0    Labs  Lab Results:  Admission on 02/11/2023, Discharged on 02/12/2023  Component Date Value Ref Range Status   WBC 02/11/2023 4.6  4.0 - 10.5 K/uL Final   RBC 02/11/2023 4.38  4.22 - 5.81 MIL/uL Final   Hemoglobin 02/11/2023 12.8 (L)  13.0 - 17.0 g/dL  Final   HCT 40/98/1191 39.4  39.0 - 52.0 % Final   MCV 02/11/2023 90.0  80.0 - 100.0 fL Final   MCH 02/11/2023 29.2  26.0 - 34.0 pg Final   MCHC 02/11/2023 32.5  30.0 - 36.0 g/dL Final   RDW 47/82/9562 13.1  11.5 - 15.5 % Final   Platelets 02/11/2023 215  150 - 400 K/uL Final   nRBC 02/11/2023 0.0  0.0 - 0.2 % Final   Neutrophils Relative % 02/11/2023 45  % Final   Neutro Abs 02/11/2023 2.1  1.7 - 7.7 K/uL Final   Lymphocytes Relative 02/11/2023 39  % Final   Lymphs Abs 02/11/2023 1.8  0.7 - 4.0 K/uL Final   Monocytes Relative 02/11/2023 9  % Final   Monocytes Absolute 02/11/2023 0.4  0.1 - 1.0 K/uL Final   Eosinophils Relative 02/11/2023 5  % Final   Eosinophils Absolute 02/11/2023 0.2  0.0 - 0.5 K/uL Final   Basophils Relative 02/11/2023 1  % Final   Basophils Absolute 02/11/2023 0.0  0.0 - 0.1 K/uL Final   Immature Granulocytes 02/11/2023 1  % Final   Abs Immature Granulocytes 02/11/2023 0.03  0.00 - 0.07 K/uL Final   Performed at [facility], [address] [place], [place] [address]   Hgb A1c MFr Bld 02/11/2023 5.3  4.8 - 5.6 % Final   Comment: (NOTE) Pre diabetes:          5.7%-6.4%  Diabetes:              >6.4%  Glycemic control for   <7.0% adults with diabetes    Mean Plasma Glucose 02/11/2023 105.41  mg/dL Final   Performed at [facility], [address], [place], [place] [address]   Alcohol, Ethyl (B) 02/11/2023 <10  <10 mg/dL Final   Comment: (NOTE) Lowest detectable limit for serum alcohol is 10 mg/dL.  For medical purposes only. Performed at [facility], [address], [place], [place] [address]    Cholesterol 02/11/2023 202 (H)  0 - 200 mg/dL Final   Triglycerides 28/41/3244 46  <150 mg/dL Final   HDL 03/17/7251 83  >40 mg/dL Final   Total CHOL/HDL Ratio 02/11/2023 2.4  RATIO Final   VLDL 02/11/2023 9  0 - 40 mg/dL Final   LDL Cholesterol 02/11/2023 110 (H)  0 - 99 mg/dL Final   Comment:        Total Cholesterol/HDL:CHD  Risk Coronary Heart Disease Risk Table                     Men  Women  1/2 Average Risk   3.4   3.3  Average Risk       5.0   4.4  2 X Average Risk   9.6   7.1  3 X Average Risk  23.4   11.0        Use the calculated Patient Ratio above and the CHD Risk Table to determine the patient's CHD Risk.        ATP III CLASSIFICATION (LDL):  <100     mg/dL   Optimal  161-096  mg/dL   Near or Above                    Optimal  130-159  mg/dL   Borderline  045-409  mg/dL   High  >811     mg/dL   Very High Performed at [facility], [address] [place], [place] [address]    POC Amphetamine UR 02/11/2023 None Detected  NONE DETECTED (Cut Off Level 1000 ng/mL) Final   POC Secobarbital (BAR) 02/11/2023 None Detected  NONE DETECTED (Cut Off Level 300 ng/mL) Final   POC Buprenorphine (BUP) 02/11/2023 None Detected  NONE DETECTED (Cut Off Level 10 ng/mL) Final   POC Oxazepam (BZO) 02/11/2023 None Detected  NONE DETECTED (Cut Off Level 300 ng/mL) Final   POC Cocaine UR 02/11/2023 None Detected  NONE DETECTED (Cut Off Level 300 ng/mL) Final   POC Methamphetamine UR 02/11/2023 None Detected  NONE DETECTED (Cut Off Level 1000 ng/mL) Final   POC Morphine 02/11/2023 None Detected  NONE DETECTED (Cut Off Level 300 ng/mL) Final   POC Methadone UR 02/11/2023 None Detected  NONE DETECTED (Cut Off Level 300 ng/mL) Final   POC Oxycodone UR 02/11/2023 None Detected  NONE DETECTED (Cut Off Level 100 ng/mL) Final   POC Marijuana UR 02/11/2023 Positive (A)  NONE DETECTED (Cut Off Level 50 ng/mL) Final  Admission on 02/06/2023, Discharged on 02/06/2023  Component Date Value Ref Range Status   WBC 02/06/2023 3.9 (L)  4.0 - 10.5 K/uL Final   RBC 02/06/2023 4.23  4.22 - 5.81 MIL/uL Final   Hemoglobin 02/06/2023 13.0  13.0 - 17.0 g/dL Final   HCT 29/56/2130 39.0  39.0 - 52.0 % Final   MCV 02/06/2023 92.2  80.0 - 100.0 fL Final   MCH 02/06/2023 30.7  26.0 - 34.0 pg Final   MCHC 02/06/2023 33.3  30.0 - 36.0  g/dL Final   RDW 86/57/8469 13.1  11.5 - 15.5 % Final   Platelets 02/06/2023 193  150 - 400 K/uL Final   nRBC 02/06/2023 0.0  0.0 - 0.2 % Final   Neutrophils Relative % 02/06/2023 33  % Final   Neutro Abs 02/06/2023 1.3 (L)  1.7 - 7.7 K/uL Final   Lymphocytes Relative 02/06/2023 51  % Final   Lymphs Abs 02/06/2023 2.0  0.7 - 4.0 K/uL Final   Monocytes Relative 02/06/2023 9  % Final   Monocytes Absolute 02/06/2023 0.3  0.1 - 1.0 K/uL Final   Eosinophils Relative 02/06/2023 6  % Final   Eosinophils Absolute 02/06/2023 0.3  0.0 - 0.5 K/uL Final   Basophils Relative 02/06/2023 1  % Final   Basophils Absolute 02/06/2023 0.0  0.0 - 0.1 K/uL Final   Immature Granulocytes 02/06/2023 0  % Final   Abs Immature Granulocytes 02/06/2023 0.00  0.00 - 0.07 K/uL Final   Performed at [facility], [address] [place], [place] [address]   Sodium  02/06/2023 137  135 - 145 mmol/L Final   Potassium 02/06/2023 4.0  3.5 - 5.1 mmol/L Final   Chloride 02/06/2023 104  98 - 111 mmol/L Final   CO2 02/06/2023 27  22 - 32 mmol/L Final   Glucose, Bld 02/06/2023 83  70 - 99 mg/dL Final   Glucose reference range applies only to samples taken after fasting for at least 8 hours.   BUN 02/06/2023 7  6 - 20 mg/dL Final   Creatinine, Ser 02/06/2023 0.96  0.61 - 1.24 mg/dL Final   Calcium 16/12/9602 8.9  8.9 - 10.3 mg/dL Final   Total Protein 54/11/8117 7.6  6.5 - 8.1 g/dL Final   Albumin 14/78/2956 4.3  3.5 - 5.0 g/dL Final   AST 21/30/8657 25  15 - 41 U/L Final   ALT 02/06/2023 17  0 - 44 U/L Final   Alkaline Phosphatase 02/06/2023 69  38 - 126 U/L Final   Total Bilirubin 02/06/2023 0.5  <1.2 mg/dL Final   GFR, Estimated 02/06/2023 >60  >60 mL/min Final   Comment: (NOTE) Calculated using the CKD-EPI Creatinine Equation (2021)    Anion gap 02/06/2023 6  5 - 15 Final   Performed at [facility], [address] [place], [place] [address]   Acetaminophen (Tylenol), Serum  02/06/2023 <10 (L)  10 - 30 ug/mL Final   Comment: (NOTE) Therapeutic concentrations vary significantly. A range of 10-30 ug/mL  may be an effective concentration for many patients. However, some  are Carll treated at concentrations outside of this range. Acetaminophen concentrations >150 ug/mL at 4 hours after ingestion  and >50 ug/mL at 12 hours after ingestion are often associated with  toxic reactions.  Performed at [facility], [address] [place], [place] [address]    Alcohol, Ethyl (B) 02/06/2023 <10  <10 mg/dL Final   Comment: (NOTE) Lowest detectable limit for serum alcohol is 10 mg/dL.  For medical purposes only. Performed at [facility], [address] [place], [place] [address]    Salicylate Lvl 02/06/2023 <7.0 (L)  7.0 - 30.0 mg/dL Final   Performed at [facility], [address] [place], [place] [address]  Admission on 01/08/2023, Discharged on 01/10/2023  Component Date Value Ref Range Status   Sodium 01/08/2023 137  135 - 145 mmol/L Final   Potassium 01/08/2023 3.8  3.5 - 5.1 mmol/L Final   Chloride 01/08/2023 102  98 - 111 mmol/L Final   CO2 01/08/2023 28  22 - 32 mmol/L Final   Glucose, Bld 01/08/2023 93  70 - 99 mg/dL Final   Glucose reference range applies only to samples taken after fasting for at least 8 hours.   BUN 01/08/2023 11  6 - 20 mg/dL Final   Creatinine, Ser 01/08/2023 0.87  0.61 - 1.24 mg/dL Final   Calcium 72/53/6644 8.9  8.9 - 10.3 mg/dL Final   Total Protein 03/47/4259 7.7  6.5 - 8.1 g/dL Final   Albumin 56/38/7564 4.4  3.5 - 5.0 g/dL Final   AST 33/29/5188 23  15 - 41 U/L Final   ALT 01/08/2023 17  0 - 44 U/L Final   Alkaline Phosphatase 01/08/2023 63  38 - 126 U/L Final   Total Bilirubin 01/08/2023 0.5  0.3 - 1.2 mg/dL Final   GFR, Estimated 01/08/2023 >60  >60 mL/min Final   Comment: (NOTE) Calculated using the CKD-EPI Creatinine Equation (2021)    Anion gap 01/08/2023 7   5 -  15 Final   Performed at [facility], [address] [place], [place] [address]   Alcohol, Ethyl (B) 01/08/2023 <10  <10 mg/dL Final   Comment: (NOTE) Lowest detectable limit for serum alcohol is 10 mg/dL.  For medical purposes only. Performed at [facility], [address] [place], [place] [address]    Salicylate Lvl 01/08/2023 <7.0 (L)  7.0 - 30.0 mg/dL Final   Performed at [facility], [address], [place], [place] [address]   Acetaminophen (Tylenol), Serum 01/08/2023 <10 (L)  10 - 30 ug/mL Final   Comment: (NOTE) Therapeutic concentrations vary significantly. A range of 10-30 ug/mL  may be an effective concentration for many patients. However, some  are [place] treated at concentrations outside of this range. Acetaminophen concentrations >150 ug/mL at 4 hours after ingestion  and >50 ug/mL at 12 hours after ingestion are often associated with  toxic reactions.  Performed at [facility], [address], [place], [place] [address]    WBC 01/08/2023 4.2  4.0 - 10.5 K/uL Final   RBC 01/08/2023 4.04 (L)  4.22 - 5.81 MIL/uL Final   Hemoglobin 01/08/2023 12.0 (L)  13.0 - 17.0 g/dL Final   HCT 29/56/2130 36.5 (L)  39.0 - 52.0 % Final   MCV 01/08/2023 90.3  80.0 - 100.0 fL Final   MCH 01/08/2023 29.7  26.0 - 34.0 pg Final   MCHC 01/08/2023 32.9  30.0 - 36.0 g/dL Final   RDW 86/57/8469 13.0  11.5 - 15.5 % Final   Platelets 01/08/2023 199  150 - 400 K/uL Final   nRBC 01/08/2023 0.0  0.0 - 0.2 % Final   Performed at [facility], [address], [place], [place] [address]   Opiates 01/08/2023 NONE DETECTED  NONE DETECTED Final   Cocaine 01/08/2023 POSITIVE (A)  NONE DETECTED Final   Benzodiazepines 01/08/2023 NONE DETECTED  NONE DETECTED Final   Amphetamines 01/08/2023 NONE DETECTED  NONE DETECTED Final   Tetrahydrocannabinol 01/08/2023 POSITIVE (A)  NONE DETECTED Final    Barbiturates 01/08/2023 NONE DETECTED  NONE DETECTED Final   Comment: (NOTE) DRUG SCREEN FOR MEDICAL PURPOSES ONLY.  IF CONFIRMATION IS NEEDED FOR ANY PURPOSE, NOTIFY LAB WITHIN 5 DAYS.  LOWEST DETECTABLE LIMITS FOR URINE DRUG SCREEN Drug Class                     Cutoff (ng/mL) Amphetamine and metabolites    1000 Barbiturate and metabolites    200 Benzodiazepine                 200 Opiates and metabolites        300 Cocaine and metabolites        300 THC                            50 Performed at [facility], [address], [place], [place] [address]     Blood Alcohol level:  Lab Results  Component Value Date   [facility] <10 02/11/2023   ETH <10 02/06/2023    Metabolic Disorder Labs: Lab Results  Component Value Date   HGBA1C 5.3 02/11/2023   MPG 105.41 02/11/2023   MPG 117 11/27/2014   Lab Results  Component Value Date   PROLACTIN 57.5 (H) 08/20/2015   PROLACTIN 23.2 (H) 02/04/2015   Lab Results  Component Value Date   CHOL 202 (H)  02/11/2023   TRIG 46 02/11/2023   HDL 83 02/11/2023   CHOLHDL 2.4 02/11/2023   VLDL 9 02/11/2023   LDLCALC 110 (H) 02/11/2023   LDLCALC 152 (H) 07/15/2015    Therapeutic Lab Levels: No results found for: "LITHIUM" Lab Results  Component Value Date   VALPROATE 25 (L) 04/16/2018   VALPROATE <10 (L) 05/15/2016   Lab Results  Component Value Date   CBMZ 10.0 08/16/2015   CBMZ 8.2 08/10/2015    Physical Findings   AIMS    Flowsheet Row [facility]) [facility] Ad[facility] (Discharged) [facility] Ad[facility] (Discharged) [facility] Ad[facility] (Dis[facility]) [facility]  AIMS Total Score 0 0 0 0 0      AUDIT     Flowsheet Row [facility]) [facility] Admission (Discharged) [facility] Ad[facility] (Discharged) [facility] Ad[facility] (Discharged) [facility] Admission (Discharged) from 07/09/2015 in [facility]  Alcohol Use Disorder Identification Test Final Score (AUDIT) 0 0 1 1 0      Flowsheet Row ED from 02/12/2023 in [facility] Most recent reading at 02/13/2023  7:51 AM ED from 02/12/2023 in [facility] Most recent reading at 02/12/2023  2:39 PM ED from 02/11/2023 in [facility] Most recent reading at 02/11/2023  3:37 PM  C-SSRS RISK CATEGORY No Risk No Risk No Risk        Musculoskeletal  Strength & Muscle Tone: within normal limits Gait & Station: normal Patient leans: N/A  Psychiatric Specialty Exam  Presentation  General Appearance:  Appropriate for Environment  Eye Contact: Good  Speech: Clear and Coherent  Speech Volume: Normal  Handedness: Right   Mood and Affect  Mood: Euthymic  Affect: Congruent   Thought Process  Thought Processes: Disorganized  Descriptions of Associations:Circumstantial  Orientation:Partial  Thought Content:Scattered  Diagnosis of Schizophrenia or Schizoaffective disorder in past: Yes  Duration of Psychotic Symptoms: Greater than six months   Hallucinations:Hallucinations: Auditory Description of Auditory Hallucinations: Patient states, "the voices are quiet today"  Ideas of Reference:None  Suicidal Thoughts:Suicidal Thoughts: No  Homicidal Thoughts:Homicidal Thoughts: No   Sensorium  Memory: Immediate Fair; Recent Fair; Remote Fair  Judgment: Impaired  Insight: Poor   Executive  Functions  Concentration: Fair  Attention Span: Fair  Recall: Fair  Fund of Knowledge: Fair  Language: Good   Psychomotor Activity  Psychomotor Activity: Psychomotor Activity: Normal   Assets  Assets: Communication Skills; Social Support; Physical Health; Leisure Time   Sleep  Sleep: Sleep: Good Number of Hours of Sleep: 8   No data recorded  Physical Exam  Physical Exam Vitals and nursing note reviewed.  HENT:     Nose:     Comments: Broken nose Eyes:     Pupils: Pupils are equal, round, and reactive to light.  Pulmonary:     Effort: Pulmonary effort is normal.  Skin:    General: Skin is dry.  Neurological:     Mental Status: He is alert. He is disoriented.    Review of Systems  HENT:  Broken nose and nose tenderness  Psychiatric/Behavioral:  Positive for hallucinations.   All other systems reviewed and are negative.  Blood pressure 117/85, pulse 78, temperature 98.7 F (37.1 C), temperature source Oral, resp. rate 18, SpO2 100%. There is no height or weight on file to calculate BMI.  Treatment Plan Summary: Daily contact with patient to assess and evaluate symptoms and progress in treatment and awaiting bed for inpatient psychiatric hospitalization for stabilization and treatment.  Thomes Lolling, NP 02/13/2023 8:18 AM

## 2023-02-13 NOTE — Progress Notes (Signed)
Patient has been denied by [facility] due to no appropriate beds available. Patient meets BH inpatient criteria per Phebe Colla, RN. Patient has been faxed out to the following facilities:   [facility]  [address] [place] [address] [phone]  [facility]  [address] [place] [address] [phone]  [facility] [address]  [address], [place] [address] [phone]  [facility] Patient [facility]acement  [facility], [place] [phone]  [facility]  [address] [place] [address] [phone]  [facility]  [address] [place] [address] [phone]  [facility] [address]  [place] [address] [phone]  [facility]  [address] [facility] [place] [place] [address] [phone]  [facility]  [address], [place] [address] [phone]  [facility]  [address] [place] [address] [phone]  [facility] EFAX  [address], [place] [phone]  [facility]  [address], [place] [address] [phone]  [facility]  [address] [place] [address] [phone]  [facility]  [address] [place], [place] [address] [phone]  [facility]  [address] [place], [place] [address] [phone]  [facility]  [address] [place] [address] [phone]  [facility]  [address] [place] [address] [phone]  [facility]  [address] [place] [address] [phone]   Damita Dunnings, MSW, LCSW-A  10:39 AM 02/13/2023

## 2023-02-14 ENCOUNTER — Encounter (HOSPITAL_COMMUNITY): Payer: Self-pay | Admitting: Student in an Organized Health Care Education/Training Program

## 2023-02-14 ENCOUNTER — Other Ambulatory Visit: Payer: Self-pay

## 2023-02-14 ENCOUNTER — Inpatient Hospital Stay (HOSPITAL_COMMUNITY)
Admission: AD | Admit: 2023-02-14 | Discharge: 2023-03-03 | DRG: 885 | Disposition: A | Discharge status: Home / Self Care | Payer: MEDICAID | Source: Intra-hospital | Attending: Psychiatry | Admitting: Psychiatry

## 2023-02-14 DIAGNOSIS — F259 Schizoaffective disorder, unspecified: Principal | ICD-10-CM | POA: Diagnosis present

## 2023-02-14 DIAGNOSIS — F1410 Cocaine abuse, uncomplicated: Secondary | ICD-10-CM | POA: Diagnosis present

## 2023-02-14 DIAGNOSIS — Z79899 Other long term (current) drug therapy: Secondary | ICD-10-CM | POA: Diagnosis not present

## 2023-02-14 DIAGNOSIS — R45851 Suicidal ideations: Secondary | ICD-10-CM | POA: Diagnosis present

## 2023-02-14 DIAGNOSIS — Z5941 Food insecurity: Secondary | ICD-10-CM

## 2023-02-14 DIAGNOSIS — F200 Paranoid schizophrenia: Secondary | ICD-10-CM | POA: Diagnosis present

## 2023-02-14 DIAGNOSIS — Z91048 Other nonmedicinal substance allergy status: Secondary | ICD-10-CM

## 2023-02-14 DIAGNOSIS — Z5982 Transportation insecurity: Secondary | ICD-10-CM

## 2023-02-14 DIAGNOSIS — G252 Other specified forms of tremor: Secondary | ICD-10-CM | POA: Diagnosis present

## 2023-02-14 DIAGNOSIS — Z888 Allergy status to other drugs, medicaments and biological substances status: Secondary | ICD-10-CM

## 2023-02-14 DIAGNOSIS — Z818 Family history of other mental and behavioral disorders: Secondary | ICD-10-CM

## 2023-02-14 DIAGNOSIS — Z91010 Allergy to peanuts: Secondary | ICD-10-CM

## 2023-02-14 DIAGNOSIS — F32A Depression, unspecified: Secondary | ICD-10-CM | POA: Diagnosis present

## 2023-02-14 DIAGNOSIS — R4183 Borderline intellectual functioning: Secondary | ICD-10-CM | POA: Diagnosis present

## 2023-02-14 DIAGNOSIS — Z91040 Latex allergy status: Secondary | ICD-10-CM | POA: Diagnosis not present

## 2023-02-14 DIAGNOSIS — F17210 Nicotine dependence, cigarettes, uncomplicated: Secondary | ICD-10-CM | POA: Diagnosis present

## 2023-02-14 DIAGNOSIS — F1220 Cannabis dependence, uncomplicated: Secondary | ICD-10-CM | POA: Diagnosis present

## 2023-02-14 MED ORDER — DIVALPROEX SODIUM 500 MG PO DR TAB
500.0000 mg | DELAYED_RELEASE_TABLET | Freq: Every day | ORAL | Status: DC
  Administered 2023-02-14: 500 mg via ORAL
  Filled 2023-02-14 (×3): qty 1

## 2023-02-14 MED ORDER — HALOPERIDOL 5 MG PO TABS: 5.0000 mg | ORAL_TABLET | Freq: Three times a day (TID) | ORAL | Status: DC | PRN

## 2023-02-14 MED ORDER — TRAZODONE HCL 50 MG PO TABS
50.0000 mg | ORAL_TABLET | Freq: Every evening | ORAL | Status: DC | PRN
  Administered 2023-02-16 – 2023-03-02 (×15): 50 mg via ORAL
  Filled 2023-02-14 (×2): qty 1
  Filled 2023-02-14: qty 7
  Filled 2023-02-14 (×14): qty 1

## 2023-02-14 MED ORDER — DIVALPROEX SODIUM 500 MG PO DR TAB: 500.0000 mg | DELAYED_RELEASE_TABLET | Freq: Every day | ORAL | Status: DC

## 2023-02-14 MED ORDER — ALUM & MAG HYDROXIDE-SIMETH 200-200-20 MG/5ML PO SUSP: 30.0000 mL | ORAL | Status: DC | PRN

## 2023-02-14 MED ORDER — ACETAMINOPHEN 325 MG PO TABS
650.0000 mg | ORAL_TABLET | Freq: Four times a day (QID) | ORAL | Status: DC | PRN
Start: 2023-02-14 — End: 2023-03-03
  Administered 2023-02-16 – 2023-02-25 (×5): 650 mg via ORAL
  Filled 2023-02-14 (×5): qty 2

## 2023-02-14 MED ORDER — HYDROXYZINE HCL 25 MG PO TABS
25.0000 mg | ORAL_TABLET | Freq: Three times a day (TID) | ORAL | Status: DC | PRN
  Administered 2023-02-16 – 2023-03-02 (×15): 25 mg via ORAL
  Filled 2023-02-14 (×10): qty 1
  Filled 2023-02-14: qty 10
  Filled 2023-02-14 (×6): qty 1

## 2023-02-14 MED ORDER — DIPHENHYDRAMINE HCL 25 MG PO CAPS: 50.0000 mg | ORAL_CAPSULE | Freq: Three times a day (TID) | ORAL | Status: DC | PRN

## 2023-02-14 MED ORDER — MAGNESIUM HYDROXIDE 400 MG/5ML PO SUSP
30.0000 mL | Freq: Every day | ORAL | Status: DC | PRN
Start: 2023-02-14 — End: 2023-03-03

## 2023-02-14 NOTE — ED Notes (Signed)
Patient is sleeping. Respirations equal and unlabored, skin warm and dry. No change in assessment or acuity. Routine safety checks conducted according to facility protocol. Will continue to monitor for safety.   

## 2023-02-14 NOTE — Progress Notes (Signed)
Pt has been accepted to [facility] on 02/14/2023 Bed assignment: 400-1. Pending VOL, Consent   Pt meets inpatient criteria per: Rhea Belton MD   Attending Physician will be: Dr. Sarita Bottom, MD   Report can be called to: Adult unit: [phone]  Pt can arrive after Consent is received  Care Team Notified: [facility] Rona Ravens RN, Rhea Belton MD, Myrlene Broker RN,    [place] Lota Leamer LCSW-A   02/14/2023 10:49 AM

## 2023-02-14 NOTE — ED Notes (Addendum)
Patient is sitting on his bed,not asleep. Respirations equal and unlabored, skin warm and dry. No change in assessment or acuity. Routine safety checks conducted according to facility protocol. Will continue to monitor for safety.

## 2023-02-14 NOTE — Tx Team (Signed)
Initial Treatment Plan 02/14/2023 4:51 PM Harshal Plitt ZHY:[id number]    PATIENT STRESSORS:  "I always fight with my brothers"   PATIENT STRENGTHS: Supportive family/friends    PATIENT IDENTIFIED PROBLEMS:  I always fight with my brother   DISCHARGE CRITERIA:  Ability to meet basic life and health needs Adequate post-discharge living arrangements Improved stabilization in mood, thinking, and/or behavior Motivation to continue treatment in a less acute level of care Reduction of life-threatening or endangering symptoms to within safe limits Safe-care adequate arrangements made  PRELIMINARY DISCHARGE PLAN: Attend aftercare/continuing care group Participate in family therapy  PATIENT/FAMILY INVOLVEMENT: This treatment plan has been presented to and reviewed with the patient, Nykeem Almendarez  The patient and family have been given the opportunity to ask questions and make suggestions.  Annitta Jersey, RN 02/14/2023, 4:51 PM

## 2023-02-14 NOTE — Group Note (Signed)
Date:  02/14/2023 Time:  9:28 PM  Group Topic/Focus:  Alcoholics Anonymous Meeting    Participation Level:  Active  Participation Quality:  Appropriate  Affect:  Appropriate  Cognitive:  Appropriate  Insight: Appropriate  Engagement in Group:  Engaged  Modes of Intervention:  Discussion and Support  Additional Comments:  Patient attended AA  [name] Love 02/14/2023, 9:28 PM
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
## 2023-02-15 DIAGNOSIS — F200 Paranoid schizophrenia: Principal | ICD-10-CM

## 2023-02-15 MED ORDER — HALOPERIDOL 5 MG PO TABS
5.0000 mg | ORAL_TABLET | Freq: Two times a day (BID) | ORAL | Status: DC
  Administered 2023-02-15 – 2023-02-16 (×3): 5 mg via ORAL
  Filled 2023-02-15 (×8): qty 1

## 2023-02-15 MED ORDER — LORAZEPAM 2 MG/ML IJ SOLN: 2.0000 mg | Freq: Three times a day (TID) | INTRAMUSCULAR | Status: DC | PRN

## 2023-02-15 MED ORDER — DIPHENHYDRAMINE HCL 50 MG/ML IJ SOLN: 50.0000 mg | Freq: Three times a day (TID) | INTRAMUSCULAR | Status: DC | PRN

## 2023-02-15 MED ORDER — HALOPERIDOL LACTATE 5 MG/ML IJ SOLN: 10.0000 mg | Freq: Three times a day (TID) | INTRAMUSCULAR | Status: DC | PRN

## 2023-02-15 MED ORDER — HALOPERIDOL LACTATE 5 MG/ML IJ SOLN: 5.0000 mg | Freq: Three times a day (TID) | INTRAMUSCULAR | Status: DC | PRN

## 2023-02-15 MED ORDER — BENZTROPINE MESYLATE 0.5 MG PO TABS
0.5000 mg | ORAL_TABLET | Freq: Two times a day (BID) | ORAL | Status: DC
  Administered 2023-02-15 – 2023-02-20 (×11): 0.5 mg via ORAL
  Filled 2023-02-15 (×18): qty 1

## 2023-02-15 MED ORDER — DIPHENHYDRAMINE HCL 25 MG PO CAPS: 50.0000 mg | ORAL_CAPSULE | Freq: Three times a day (TID) | ORAL | Status: DC | PRN

## 2023-02-15 MED ORDER — LORAZEPAM 2 MG/ML IJ SOLN
2.0000 mg | Freq: Three times a day (TID) | INTRAMUSCULAR | Status: DC | PRN
Start: 2023-02-15 — End: 2023-03-03

## 2023-02-15 MED ORDER — HALOPERIDOL 5 MG PO TABS
5.0000 mg | ORAL_TABLET | Freq: Three times a day (TID) | ORAL | Status: DC | PRN
Start: 2023-02-15 — End: 2023-03-03

## 2023-02-15 MED ORDER — DIVALPROEX SODIUM ER 500 MG PO TB24
750.0000 mg | ORAL_TABLET | Freq: Every day | ORAL | Status: DC
  Administered 2023-02-15 – 2023-02-23 (×9): 750 mg via ORAL
  Filled 2023-02-15 (×14): qty 1

## 2023-02-15 NOTE — Progress Notes (Signed)
   02/15/23 2200  Psych Admission Type (Psych Patients Only)  Admission Status Voluntary  Psychosocial Assessment  Patient Complaints Suspiciousness  Eye Contact Brief  Facial Expression Blank  Affect Appropriate to circumstance  Speech Tangential  Interaction Cautious  Motor Activity Slow  Appearance/Hygiene Unremarkable  Behavior Characteristics Cooperative  Mood Suspicious  Aggressive Behavior  Effect No apparent injury  Thought Process  Coherency Tangential  Content Preoccupation  Delusions Paranoid  Perception Hallucinations  Hallucination Auditory  Judgment Limited  Confusion Mild  Danger to Self  Current suicidal ideation? Denies  Agreement Not to Harm Self Yes  Description of Agreement Verbal  Danger to Others  Danger to Others None reported or observed

## 2023-02-15 NOTE — [facility] Suicide Risk Assessment (Signed)
Suicide Risk Assessment  Admission Assessment    [facility] Admission Suicide Risk Assessment   Nursing information obtained from:  Patient Demographic factors:  Male Current Mental Status:  NA Loss Factors:  NA Historical Factors:  NA Risk Reduction Factors:  NA  Total Time spent with patient: 1.5 hours Principal Problem: Schizophrenia, paranoid type (HCC) Diagnosis:  Principal Problem:   Schizophrenia, paranoid type (HCC) Active Problems:   Cannabis use disorder, severe, dependence (HCC)   Borderline intellectual functioning   Cocaine use disorder (HCC)  Subjective Data: Psychosis   Continued Clinical Symptoms: Disorganized thoughts, auditory hallucinations, thought broadcasting. Pt is very mentally sick, not coherent or logical, requires continuous hospitalization to treat and stabilize mental status.  Alcohol Use Disorder Identification Test Final Score (AUDIT): 0 The "Alcohol Use Disorders Identification Test", Guidelines for Use in Primary Care, Second Edition.  World Science writer [facility]). Score between 0-7:  no or low risk or alcohol related problems. Score between 8-15:  moderate risk of alcohol related problems. Score between 16-19:  high risk of alcohol related problems. Score 20 or above:  warrants further diagnostic evaluation for alcohol dependence and treatment.  CLINICAL FACTORS:   Depression:   Anhedonia Impulsivity Insomnia Severe More than one psychiatric diagnosis Currently Psychotic Previous Psychiatric Diagnoses and Treatments  Musculoskeletal: Strength & Muscle Tone: within normal limits Gait & Station: normal Patient leans: N/A  Psychiatric Specialty Exam:  Presentation  General Appearance:  Fairly Groomed  Eye Contact: Fair  Speech: Normal Rate  Speech Volume: Decreased  Handedness: Right   Mood and Affect  Mood: Depressed  Affect: Non-Congruent   Thought Process  Thought Processes: Disorganized  Descriptions of  Associations:Tangential  Orientation:Partial  Thought Content:Illogical  History of Schizophrenia/Schizoaffective disorder:Yes  Duration of Psychotic Symptoms:Greater than six months  Hallucinations:Hallucinations: Auditory Description of Auditory Hallucinations: Denies hearing voices today, last heard them yesterday  Ideas of Reference:Paranoia  Suicidal Thoughts:Suicidal Thoughts: No  Homicidal Thoughts:Homicidal Thoughts: No   Sensorium  Memory: Immediate Poor; Recent Poor; Remote Poor  Judgment: Poor  Insight: Poor   Executive Functions  Concentration: Fair  Attention Span: Poor; Fair  Recall: Poor  Fund of Knowledge: Poor  Language: Poor   Psychomotor Activity  Psychomotor Activity: Psychomotor Activity: Normal   Assets  Assets: Resilience; Social Support   Sleep  Sleep: Sleep: Poor    Physical Exam: Physical Exam Vitals and nursing note reviewed.    Review of Systems  Psychiatric/Behavioral:  Positive for depression, hallucinations and substance abuse. Negative for memory loss and suicidal ideas. The patient is nervous/anxious and has insomnia.   All other systems reviewed and are negative.  Blood pressure (!) 142/79, pulse 80, temperature 98.6 F (37 C), temperature source Oral, resp. rate 16, height 5\' 8"  (1.727 m), weight 74 kg, SpO2 100%. Body mass index is 24.81 kg/m.   COGNITIVE FEATURES THAT CONTRIBUTE TO RISK:  Thought constriction (tunnel vision)    SUICIDE RISK:   Severe:  Frequent, intense, and enduring suicidal ideation, specific plan, no subjective intent, but some objective markers of intent (i.e., choice of lethal method), the method is accessible, some limited preparatory behavior, evidence of impaired self-control, severe dysphoria/symptomatology, multiple risk factors present, and few if any protective factors, particularly a lack of social support.  PLAN OF CARE: Please See H & P  I certify that inpatient  services furnished can reasonably be expected to improve the patient's condition.   Starleen Blue, NP 02/15/2023, 5:01 PM
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
## 2023-02-15 NOTE — [facility] Suicide Risk Assessment (Signed)
[facility] INPATIENT:  Family/Significant Other Suicide Prevention Education  Suicide Prevention Education:  Contact Attempts: [name] (mom) [phone], (name of family member/significant other) has been identified by the patient as the family member/significant other with whom the patient will be residing, and identified as the person(s) who will aid the patient in the event of a mental health crisis.  With written consent from the patient, two attempts were made to provide suicide prevention education, prior to and/or following the patient's discharge.  We were unsuccessful in providing suicide prevention education.  A suicide education pamphlet was given to the patient to share with family/significant other.  CSW unable to leave VM, CSW will attempt another call prior to discharge.   Date and time of first attempt:02/15/23/1517   [name] Love 02/15/2023, 3:19 PM

## 2023-02-15 NOTE — Progress Notes (Signed)
   02/14/23 2041  Psych Admission Type (Psych Patients Only)  Admission Status Voluntary  Psychosocial Assessment  Patient Complaints Anxiety;Sadness  Eye Contact Fair  Facial Expression Anxious  Affect Anxious  Speech Soft  Interaction Cautious  Motor Activity Slow  Appearance/Hygiene Unremarkable  Behavior Characteristics Cooperative  Mood Sad  Thought Process  Coherency Disorganized  Content WDL  Delusions None reported or observed  Perception Hallucinations  Hallucination Auditory  Judgment Limited  Confusion Mild  Danger to Self  Current suicidal ideation? Denies  Agreement Not to Harm Self Yes  Description of Agreement verbal  Danger to Others  Danger to Others None reported or observed

## 2023-02-15 NOTE — BHH Counselor (Signed)
Adult Comprehensive Assessment  Patient ID: [name] Love, male   DOB: 1995-08-07, 27 y.o.   MRN: [id number]  Information Source: Information source: Patient  Current Stressors:  Patient states their primary concerns and needs for treatment are:: "I cannot talk about it. My behavior" Patient states their goals for this hospitilization and ongoing recovery are:: "I have to go to classes and learn" Educational / Learning stressors: None reported Employment / Job issues: None reported Family Relationships: None reported Surveyor, quantity / Lack of resources (include bankruptcy): None reported Housing / Lack of housing: None reported Physical health (include injuries & life threatening diseases): None reported Social relationships: None reported Substance abuse: None reported Bereavement / Loss: None reported  Living/Environment/Situation:  Living Arrangements: Parent, Other relatives Living conditions (as described by patient or guardian): "Good" Who else lives in the home?: Brother, sister, mother How long has patient lived in current situation?: 2 years, apartment What is atmosphere in current home: Comfortable, Paramedic  Family History:  Marital status: Single Are you sexually active?: No What is your sexual orientation?: heterosexual Has your sexual activity been affected by drugs, alcohol, medication, or emotional stress?: unknown Does patient have children?: No  Childhood History:  By whom was/is the patient raised?: Mother Description of patient's relationship with caregiver when they were a child: "It was real good" Patient's description of current relationship with people who raised him/her: "It's real good" How were you disciplined when you got in trouble as a child/adolescent?: "I would be whipped" Does patient have siblings?: Yes Number of Siblings: 2 Description of patient's current relationship with siblings: Says he has two siblings - brother and sister Did patient suffer any  verbal/emotional/physical/sexual abuse as a child?: No Did patient suffer from severe childhood neglect?: No Has patient ever been sexually abused/assaulted/raped as an adolescent or adult?: No Was the patient ever a victim of a crime or a disaster?: No Witnessed domestic violence?: No Has patient been affected by domestic violence as an adult?: No  Education:  Highest grade of school patient has completed: 12 Currently a Consulting civil engineer?: No Learning disability?: No  Employment/Work Situation:   Employment Situation: Unemployed Patient's Job has Been Impacted by Current Illness: No What is the Longest Time Patient has Held a Job?: 1 year Where was the Patient Employed at that Time?: "I don't know" Has Patient ever Been in the [employer]?: No  Financial Resources:   Financial resources: No income, Medicaid Does patient have a Lawyer or guardian?: No  Alcohol/Substance Abuse:   What has been your use of drugs/alcohol within the last 12 months?: None reported If attempted suicide, did drugs/alcohol play a role in this?: No Alcohol/Substance Abuse Treatment Hx: Denies past history Has alcohol/substance abuse ever caused legal problems?: No  Social Support System:   Conservation officer, nature Support System: Good Describe Community Support System: Family Type of faith/religion: "What?" How does patient's faith help to cope with current illness?: None reported  Leisure/Recreation:   Do You Have Hobbies?: Yes Leisure and Hobbies: "I watch tv, movies, smoke and drink with my family"  Strengths/Needs:   What is the patient's perception of their strengths?: "I am struggling in the kitchen" Patient states they can use these personal strengths during their treatment to contribute to their recovery: None reported Patient states these barriers may affect/interfere with their treatment: None reported Patient states these barriers may affect their return to the community: None  reported Other important information patient would like considered in planning for their treatment: None reported  Discharge Plan:   Currently receiving community mental health services: No Patient states concerns and preferences for aftercare planning are: "[facility]" Patient states they will know when they are safe and ready for discharge when: Not reported Does patient have access to transportation?: Yes Does patient have financial barriers related to discharge medications?: No Will patient be returning to same living situation after discharge?: Yes  Summary/Recommendations:   Summary and Recommendations (to be completed by the evaluator): [name] Love is a 27 year old male who is voluntarily admitted to [facility] due to bizarre behavior and thoughts that he is being "murdered" when people look at him. Pt had disorganized thoughts and pressured speech during assessment, did not make eye contact, and repeatedly asked this writer "do you know what I mean? Seriously do you get what I am saying?" Pt denies any and all substance use, states "I need to get to class and I need a cigarrette." A majority of responses initially started with "I need a cigarrette and to go to class." Pt would not elaborate or explain what class he was referring to, pt is not a student either. Pt currently denies AVH, SI and HI. Pt fixated on Thanksgiving and having trouble cooking in his kitchen. Given permission to contact pt's mother for collateral. Pt reports being seen at [facility] for class and enjoys learning through that "program." Pt unable to give additional information regarding this service. Denies therapy and MM providers. While here, Angela Stalzer can benefit from crisis stabilization, medication management, therapeutic milieu, and referrals for services.   Kathi Der. 02/15/2023

## 2023-02-15 NOTE — Group Note (Signed)
LCSW Group Therapy Note  Group Date: 02/15/2023 Start Time: 1110 End Time: 1210   Type of Therapy and Topic:  Group Therapy - Healthy vs Unhealthy Coping Skills  Participation Level:  Minimal   Description of Group The focus of this group was to determine what unhealthy coping techniques typically are used by group members and what healthy coping techniques would be helpful in coping with various problems. Patients were guided in becoming aware of the differences between healthy and unhealthy coping techniques. Patients were asked to identify 2-3 healthy coping skills they would like to learn to use more effectively.  Therapeutic Goals Patients learned that coping is what human beings do all day long to deal with various situations in their lives Patients defined and discussed healthy vs unhealthy coping techniques Patients identified their preferred coping techniques and identified whether these were healthy or unhealthy Patients determined 2-3 healthy coping skills they would like to become more familiar with and use more often. Patients provided support and ideas to each other   Summary of Patient Progress:  Patient participated in group on today, however lacked insight into the subject matter. Patient was fixated on talking about cigarettes and "dreamworks". Patient stated something related to "having bad dreams and nightmares but then you smoke a cigarette then something is working so I need to buy a whole pack of cigarettes". Patient was easily redirectable. Update to be provided to staff regarding his reaction to group.    Therapeutic Modalities Cognitive Behavioral Therapy Motivational Interviewing  Loleta Dicker, [place] 02/15/2023  1:57 PM

## 2023-02-15 NOTE — Group Note (Signed)
Recreation Therapy Group Note   Group Topic:Animal Assisted Therapy   Group Date: 02/15/2023 Start Time: 3244 End Time: 1033 Facilitators: Jerzi Tigert-McCall, LRT,CTRS Location: 300 [address] Dayroom   Animal-Assisted Activity (AAA) Program Checklist/Progress Notes Patient Eligibility Criteria Checklist & Daily Group note for Rec Tx Intervention  AAA/T Program Assumption of Risk Form signed by Patient/ or Parent Legal Guardian Yes  Patient understands his/her participation is voluntary Yes  Education: Charity fundraiser, Appropriate Animal Interaction   Education Outcome: Acknowledges education.    Affect/Mood: N/A   Participation Level: Did not attend    Clinical Observations/Individualized Feedback:     Plan: Continue to engage patient in RT group sessions 2-3x/week.   [name] Love, LRT,CTRS 02/15/2023 12:39 PM

## 2023-02-15 NOTE — Plan of Care (Addendum)
Problem: Health Behavior/Discharge Planning: Goal: Compliance with treatment plan for underlying cause of condition will improve Outcome: Progressing   Problem: Physical Regulation: Goal: Ability to maintain clinical measurements within normal limits will improve Outcome: Progressing   Problem: Safety: Goal: Periods of time without injury will increase Outcome: Progressing   Pt awake in dayroom on initial contact. Denies SI, HI, AVH and pain when assessed. Observed with delayed responses, thought blocking, confused, paranoid, preoccupied and suspicious / very watchful of others, scanning hall with avertive eye contact on interactions. Per pt "I'm not supposed to be here, my grandma made me come here. I was watching TV, listening to music and now I'm here. What are you writing, don't be writing too much on me. I need to see it". However he remains compliant with his medications when offered, despite pauses prior to taking taking. Safety checks maintained at Q 15 minutes intervals without incident.

## 2023-02-15 NOTE — [facility] Group Notes (Signed)
[facility] Group Notes:  (Nursing/MHT/Case Management/Adjunct)  Date:  02/15/2023  Time:  9:59 PM  Type of Therapy:  Psychoeducational Skills  Participation Level:  Did Not Attend  Participation Quality:  Resistant  Affect:  Resistant  Cognitive:  Lacking  Insight:  None  Engagement in Group:  None  Modes of Intervention:  Education  Summary of Progress/Problems: The patient did not attend group this evening.   Hazle Coca S 02/15/2023, 9:59 PM

## 2023-02-16 ENCOUNTER — Encounter (HOSPITAL_COMMUNITY): Payer: Self-pay

## 2023-02-16 DIAGNOSIS — F200 Paranoid schizophrenia: Secondary | ICD-10-CM | POA: Diagnosis not present

## 2023-02-16 LAB — VITAMIN D 25 HYDROXY (VIT D DEFICIENCY, FRACTURES): Vit D, 25-Hydroxy: 15.13 ng/mL — ABNORMAL LOW (ref 30–100)

## 2023-02-16 LAB — TSH: TSH: 1.196 u[IU]/mL (ref 0.350–4.500)

## 2023-02-16 MED ORDER — WHITE PETROLATUM EX OINT: TOPICAL_OINTMENT | CUTANEOUS | Status: DC | PRN

## 2023-02-16 MED ORDER — HALOPERIDOL 5 MG PO TABS
5.0000 mg | ORAL_TABLET | Freq: Every day | ORAL | Status: DC
  Administered 2023-02-17 – 2023-02-19 (×3): 5 mg via ORAL
  Filled 2023-02-16 (×5): qty 1

## 2023-02-16 MED ORDER — VITAMIN D (ERGOCALCIFEROL) 1.25 MG (50000 UNIT) PO CAPS
50000.0000 [IU] | ORAL_CAPSULE | ORAL | Status: DC
  Administered 2023-02-16 – 2023-03-02 (×3): 50000 [IU] via ORAL
  Filled 2023-02-16 (×4): qty 1

## 2023-02-16 MED ORDER — HALOPERIDOL 5 MG PO TABS
10.0000 mg | ORAL_TABLET | Freq: Every day | ORAL | Status: DC
  Administered 2023-02-16: 10 mg via ORAL
  Filled 2023-02-16 (×4): qty 2

## 2023-02-16 NOTE — Progress Notes (Signed)
   02/16/23 1200  Psychosocial Assessment  Patient Complaints Suspiciousness  Eye Contact Brief  Facial Expression Blank  Affect Appropriate to circumstance  Speech Tangential  Interaction Cautious  Motor Activity Slow  Appearance/Hygiene Unremarkable  Behavior Characteristics Cooperative  Mood Suspicious  Thought Process  Coherency Tangential  Content Preoccupation  Delusions Paranoid  Perception Hallucinations  Hallucination Auditory  Judgment Limited  Confusion Mild  Danger to Self  Current suicidal ideation? Denies  Self-Injurious Behavior No self-injurious ideation or behavior indicators observed or expressed

## 2023-02-16 NOTE — Group Note (Signed)
Date:  02/16/2023 Time:  4:07 PM  Group Topic/Focus:  Dimensions of Wellness:   The focus of this group is to introduce the topic of wellness and discuss the role each dimension of wellness plays in total health.    Participation Level:  Active  Participation Quality:  Appropriate  Affect:  Appropriate  Cognitive:  Appropriate  Insight: Appropriate  Engagement in Group:  Engaged  Modes of Intervention:  Activity, Discussion, and Socialization  Additional Comments:   Pt attended and actively participated in the Social Wellness group activity. Pt practiced active listening and effective communication skills during the Telephone Game.  [name] Love 02/16/2023, 4:07 PM

## 2023-02-16 NOTE — B[facility] Group Notes (Signed)
Spiritual care group facilitated by Chaplain Dyanne Carrel, [facility]  Group focused on topic of strength. Group members reflected on what thoughts and feelings emerge when they hear this topic. They then engaged in facilitated dialog around how strength is present in their lives. This dialog focused on representing what strength had been to them in their lives (images and patterns given) and what they saw as helpful in their life now (what they needed / wanted).  Activity drew on narrative framework.  Patient Progress: [name] Love attended group and actively engaged and participated in group conversation and activities. Sometimes his comments were difficult to follow.

## 2023-02-16 NOTE — BH IP Treatment Plan (Signed)
Interdisciplinary Treatment and Diagnostic Plan Update  02/16/2023 Time of Session: 10:30 am [name] Love MRN: [id number]  Principal Diagnosis: Schizophrenia, paranoid type (HCC)  Secondary Diagnoses: Principal Problem:   Schizophrenia, paranoid type (HCC) Active Problems:   Cannabis use disorder, severe, dependence (HCC)   Borderline intellectual functioning   Cocaine use disorder (HCC)   Current Medications:  Current Facility-Administered Medications  Medication Dose Route Frequency Provider Last Rate Last Admin   acetaminophen (TYLENOL) tablet 650 mg  650 mg Oral Q6H PRN Tomie China, MD       alum & mag hydroxide-simeth (MAALOX/MYLANTA) 200-200-20 MG/5ML suspension 30 mL  30 mL Oral Q4H PRN Tomie China, MD       benztropine (COGENTIN) tablet 0.5 mg  0.5 mg Oral BID Starleen Blue, NP   0.5 mg at 02/16/23 2841   haloperidol (HALDOL) tablet 5 mg  5 mg Oral TID PRN Phineas Inches, MD       And   diphenhydrAMINE (BENADRYL) capsule 50 mg  50 mg Oral TID PRN Massengill, Harrold Donath, MD       haloperidol lactate (HALDOL) injection 5 mg  5 mg Intramuscular TID PRN Massengill, Harrold Donath, MD       And   diphenhydrAMINE (BENADRYL) injection 50 mg  50 mg Intramuscular TID PRN Massengill, Harrold Donath, MD       And   LORazepam (ATIVAN) injection 2 mg  2 mg Intramuscular TID PRN Massengill, Harrold Donath, MD       haloperidol lactate (HALDOL) injection 10 mg  10 mg Intramuscular TID PRN Massengill, Harrold Donath, MD       And   diphenhydrAMINE (BENADRYL) injection 50 mg  50 mg Intramuscular TID PRN Massengill, Harrold Donath, MD       And   LORazepam (ATIVAN) injection 2 mg  2 mg Intramuscular TID PRN Phineas Inches, MD       divalproex (DEPAKOTE ER) 24 hr tablet 750 mg  750 mg Oral QHS Massengill, Harrold Donath, MD   750 mg at 02/15/23 2136   haloperidol (HALDOL) tablet 5 mg  5 mg Oral BID Starleen Blue, NP   5 mg at 02/16/23 3244   hydrOXYzine (ATARAX) tablet 25 mg  25 mg Oral TID PRN Tomie China, MD        magnesium hydroxide (MILK OF MAGNESIA) suspension 30 mL  30 mL Oral Daily PRN Tomie China, MD       traZODone (DESYREL) tablet 50 mg  50 mg Oral QHS PRN Tomie China, MD       PTA Medications: Medications Prior to Admission  Medication Sig Dispense Refill Last Dose   divalproex (DEPAKOTE) 500 MG DR tablet Take 1 tablet (500 mg total) by mouth at bedtime.       Patient Stressors:    Patient Strengths: Supportive family/friends   Treatment Modalities: Medication Management, Group therapy, Case management,  1 to 1 session with clinician, Psychoeducation, Recreational therapy.   Physician Treatment Plan for Primary Diagnosis: Schizophrenia, paranoid type (HCC) Long Term Goal(s): Improvement in symptoms so as ready for discharge   Short Term Goals: Ability to identify changes in lifestyle to reduce recurrence of condition will improve Ability to verbalize feelings will improve Ability to disclose and discuss suicidal ideas Ability to demonstrate self-control will improve Ability to identify and develop effective coping behaviors will improve Ability to maintain clinical measurements within normal limits will improve Compliance with prescribed medications will improve Ability to identify triggers associated with substance abuse/mental health issues will improve  Medication Management: Evaluate patient's  response, side effects, and tolerance of medication regimen.  Therapeutic Interventions: 1 to 1 sessions, Unit Group sessions and Medication administration.  Evaluation of Outcomes: Not Progressing  Physician Treatment Plan for Secondary Diagnosis: Principal Problem:   Schizophrenia, paranoid type (HCC) Active Problems:   Cannabis use disorder, severe, dependence (HCC)   Borderline intellectual functioning   Cocaine use disorder (HCC)  Long Term Goal(s): Improvement in symptoms so as ready for discharge   Short Term Goals: Ability to identify changes in lifestyle  to reduce recurrence of condition will improve Ability to verbalize feelings will improve Ability to disclose and discuss suicidal ideas Ability to demonstrate self-control will improve Ability to identify and develop effective coping behaviors will improve Ability to maintain clinical measurements within normal limits will improve Compliance with prescribed medications will improve Ability to identify triggers associated with substance abuse/mental health issues will improve     Medication Management: Evaluate patient's response, side effects, and tolerance of medication regimen.  Therapeutic Interventions: 1 to 1 sessions, Unit Group sessions and Medication administration.  Evaluation of Outcomes: Not Progressing   RN Treatment Plan for Primary Diagnosis: Schizophrenia, paranoid type (HCC) Long Term Goal(s): Knowledge of disease and therapeutic regimen to maintain health will improve  Short Term Goals: Ability to remain free from injury will improve, Ability to verbalize frustration and anger appropriately will improve, Ability to demonstrate self-control, Ability to participate in decision making will improve, Ability to verbalize feelings will improve, Ability to disclose and discuss suicidal ideas, Ability to identify and develop effective coping behaviors will improve, and Compliance with prescribed medications will improve  Medication Management: RN will administer medications as ordered by provider, will assess and evaluate patient's response and provide education to patient for prescribed medication. RN will report any adverse and/or side effects to prescribing provider.  Therapeutic Interventions: 1 on 1 counseling sessions, Psychoeducation, Medication administration, Evaluate responses to treatment, Monitor vital signs and CBGs as ordered, Perform/monitor CIWA, COWS, AIMS and Fall Risk screenings as ordered, Perform wound care treatments as ordered.  Evaluation of Outcomes: Not  Progressing   LCSW Treatment Plan for Primary Diagnosis: Schizophrenia, paranoid type (HCC) Long Term Goal(s): Safe transition to appropriate next level of care at discharge, Engage patient in therapeutic group addressing interpersonal concerns.  Short Term Goals: Engage patient in aftercare planning with referrals and resources, Increase social support, Increase ability to appropriately verbalize feelings, Increase emotional regulation, and Facilitate acceptance of mental health diagnosis and concerns  Therapeutic Interventions: Assess for all discharge needs, 1 to 1 time with Social worker, Explore available resources and support systems, Assess for adequacy in community support network, Educate family and significant other(s) on suicide prevention, Complete Psychosocial Assessment, Interpersonal group therapy.  Evaluation of Outcomes: Not Progressing   Progress in Treatment: Attending groups: Yes. Participating in groups: Yes. Taking medication as prescribed: Yes. Toleration medication: Yes. Family/Significant other contact made: Yes, individual(s) contacted:  [name], mother [phone] Patient understands diagnosis: Yes. Discussing patient identified problems/goals with staff: Yes. Medical problems stabilized or resolved: Yes. Denies suicidal/homicidal ideation: Yes. Issues/concerns per patient self-inventory: No. Other: na  New problem(s) identified: No, Describe:  none reported  New Short Term/Long Term Goal(s): medication stabilization, elimination of SI thoughts, development of comprehensive mental wellness plan.    Patient Goals:  " I would like to stay here for a long time and improve my behavior- I need a safe a place for a long time"  Discharge Plan or Barriers:  Patient recently admitted. CSW will  continue to follow and assess for appropriate referrals and possible discharge planning.    Reason for Continuation of Hospitalization: Aggression Delusions   Hallucinations Suicidal ideation  Estimated Length of Stay: 5-7 days  Last 3 [place] Suicide Severity Risk Score: Flowsheet Row Ad[facility] (Current[facility] from 02/12/2023 in [facility] ED from 02/11/2023 in [facility]  C-SSRS RISK CATEGORY No Risk No Risk No Risk       Last [facility] 2/9 Scores:     No data to display          Scribe for Treatment Team: [place], [place] 02/16/2023 10:24 AM

## 2023-02-16 NOTE — Group Note (Signed)
Recreation Therapy Group Note   Group Topic:Team Building  Group Date: 02/16/2023 Start Time: 0930 End Time: 0952 Facilitators: Catilyn Boggus-McCall, LRT,CTRS Location: [address] Hall Dayroom   Group Topic: Communication, Team Building, Problem Solving  Goal Area(s) Addresses:  Patient will effectively work with peer towards shared goal.  Patient will identify skills used to make activity successful.  Patient will share challenges and verbalize solution-driven approaches used. Patient will identify how skills used during activity can be used to reach post d/c goals.   Intervention: STEM Activity   Group Description: [employer]. Patients were provided the following materials: 4 drinking straws, 5 rubber bands, 5 paper clips, 2 index cards and 2 drinking cups. Using the provided materials patients were asked to build a launching mechanism to launch a ping pong ball across the room, approximately 10 feet. Patients were divided into teams of 3-5. Instructions required all materials be incorporated into the device, functionality of items left to the peer group's discretion.  Education: Pharmacist, community, Scientist, physiological, Air cabin crew, Building control surveyor.   Education Outcome: Acknowledges education/In group clarification offered/Needs additional education.    Affect/Mood: Appropriate   Participation Level: Engaged   Participation Quality: Independent   Behavior: Attentive    Speech/Thought Process: Focused   Insight: Moderate   Judgement: Moderate   Modes of Intervention: STEM Activity   Patient Response to Interventions:  Engaged   Education Outcome:  In group clarification offered    Clinical Observations/Individualized Feedback: Pt was quiet but engaged with peers. Pt was attentive and followed the instructions of peers in what he needed to do to help the team.      Plan: Continue to engage patient in RT group sessions 2-3x/week.   Makayla Confer-McCall,  LRT,CTRS 02/16/2023 12:00 PM

## 2023-02-16 NOTE — Progress Notes (Signed)
   02/16/23 0557  15 Minute Checks  Location Bedroom  Visual Appearance Calm  Behavior Sleeping  Sleep (Behavioral Health Patients Only)  Calculate sleep? (Click Yes once per 24 hr at 0600 safety check) Yes  Documented sleep last 24 hours 8

## 2023-02-16 NOTE — BHH Suicide Risk Assessment (Addendum)
[facility] INPATIENT:  Family/Significant Other Suicide Prevention Education  Suicide Prevention Education:  Contact Attempts: [name] (mom) [phone], (name of family member/significant other) has been identified by the patient as the family member/significant other with whom the patient will be residing, and identified as the person(s) who will aid the patient in the event of a mental health crisis.  With written consent from the patient, two attempts were made to provide suicide prevention education, prior to and/or following the patient's discharge.  We were unsuccessful in providing suicide prevention education.  A suicide education pamphlet was given to the patient to share with family/significant other.  Date and time of second attempt: 02/16/23 @ 9:45AM and 1:00PM.  Pt's mother phone goes straight to voicemail, the voicemail has not been set up. Attempted to call twice this morning. Will attempt again to gain collateral before discharge.   Attempted again 02/17/23 @ 2:30PM, unable to leave voice mail.   Kathi Der 02/16/2023, 9:47 AM

## 2023-02-16 NOTE — [facility] Group Notes (Signed)
[facility] Group Notes:  (Nursing/MHT/Case Management/Adjunct)  Date:  02/16/2023  Time:  2000  Type of Therapy:   Wrap up group  Participation Level:  Active  Participation Quality:  Attentive, Resistant, and Sharing  Affect:  Anxious and Resistant  Cognitive:  Disorganized  Insight:  Limited  Engagement in Group:  Improving  Modes of Intervention:  Clarification, Education, and Support  Summary of Progress/Problems:Positive thinking and positive change were discussed. Pt was anxious, paranoid, and at times illogical but tried to engage appropriately in group and was attentive.   Johann Capers S 02/16/2023, 9:10 PM

## 2023-02-16 NOTE — Progress Notes (Cosign Needed Addendum)
[facility] MD Progress Note  02/16/2023 3:20 PM [name] Love  MRN:  [id number]  HPI: Pt is a 27 yo [place] American male with prior mental health diagnoses of P. Schizophrenia, Borderline Intellectual functioning, Cannabis use disorder who was transferred & admitted voluntarily to this [facility] on 12/02 after initially presenting to the [facility] on 11/29 with psychosis; Presented with disorganized thoughts, and making illogical statements, accompanied by his family members. As per documentation from the [facility]: "Pt states that he gets killed every time he states what's wrong. Pt states that his body talks to him, such as his butt. Pt denies SI, HI, AH and alcohol use at this present time. Pt endorses VH, stating that he sees letters and smoking synthetic herbs. Pt states that we can Strick help him by providing him with liquids and blood work."   24-hour chart review: Vital signs within normal limits, with exception of heart rate slightly elevated at 101 earlier today morning.  As per nursing reports and documentation, psychosis has been persistent over the past 24 hours, rendering patient unable to actively participate in group session on the 300 Hall of 400 holes.  Patient slept for a total of 8 hours last night as per nursing documentation.  Assessment: Psychosis is persistent today, and we will be transferring patient today [address], as he is unable to program on the 300 or 400 halls due to current mental status.  During encounter with patient today, he is tangential,  illogical, presents with flight of ideas, provides nonlinear responses to questions being asked, he perseverates about things that are unrelated to questions being asked to him.  Patient is asked if he is in pain physically, and responds that he is.  Writer asked him what part of his body hurts, and he states "the world."  Writer asked for patient to elaborate further, and he states "I graduated, I did not learn anything.  I learned or  liked."  Patient has also consistently reported auditory hallucinations of a male voice that he hears in his head, states today that the voice is frightening, and that he is fearful of it.  He appeared guarded during encounter, had his eyes fixated on one spot on the wall, and when asked what he was looking at, he stated "the cross."  When asked what this entails, he stated "church."  He denies visual hallucinations, but also presents with thought broadcasting, states that other people are able to read his mind.  Denies other first rank symptoms.  Denies SI/HI.  He reports that he had a bowel movement today, denies medication related side effects, reports that he is sleeping well, reports a good appetite.  Denies any other concerns.  Due to the severity of psychosis being presented by patient, it is pertinent that he remains hospitalized at this time, and it is estimated at this time that he will be at the hospital for the next week to allow enough time for treatment and stabilization of his mental status.  We are increasing Haldol to 10 mg at night, and keeping morning dose at 5 mg, for psychosis and mood stabilization.  We will continue other medications as listed below.  We will start revisiting discharge planning once symptoms are stable enough for discharge.  Writer attempted again to call mother today, but there was no response, CSW also attempted to call mother today, with no response. No TD/EPS type symptoms found on assessment, and pt denies any feelings of stiffness. AIMS: 0.  Principal Problem: Schizophrenia, paranoid type (HCC) Diagnosis: Principal Problem:   Schizophrenia, paranoid type (HCC) Active Problems:   Cannabis use disorder, severe, dependence (HCC)   Borderline intellectual functioning   Cocaine use disorder (HCC)  Total Time spent with patient: 45 minutes  Past Psychiatric History: See H & P  Past Medical History:  Past Medical History:  Diagnosis Date   Schizophrenia,  acute (HCC)    History reviewed. No pertinent surgical history. Family History:  Family History  Problem Relation Age of Onset   Mental illness Cousin    Family Psychiatric  History: See H & P Social History:  Social History   Substance and Sexual Activity  Alcohol Use Yes   Comment: a little      Social History   Substance and Sexual Activity  Drug Use No   Comment: denies    Social History   Socioeconomic History   Marital status: Single    Spouse name: Not on file   Number of children: Not on file   Years of education: Not on file   Highest education level: Not on file  Occupational History   Not on file  Tobacco Use   Smoking status: Every Day    Current packs/day: 0.00    Types: Cigarettes   Smokeless tobacco: Never  Substance and Sexual Activity   Alcohol use: Yes    Comment: a little    Drug use: No    Comment: denies   Sexual activity: Not Currently  Other Topics Concern   Not on file  Social History Narrative   Not on file   Social Determinants of Health   Financial Resource Strain: Not on file  Food Insecurity: Food Insecurity Present (02/14/2023)   Hunger Vital Sign    Worried About Running Out of Food in the Last Year: Sometimes true    Ran Out of Food in the Last Year: Sometimes true  Transportation Needs: Unmet Transportation Needs (02/14/2023)   PRAPARE - Administrator, Civil Service (Medical): Yes    Lack of Transportation (Non-Medical): Yes  Physical Activity: Not on file  Stress: Not on file  Social Connections: Not on file   Sleep: Good  Appetite:  Good  Current Medications: Current Facility-Administered Medications  Medication Dose Route Frequency Provider Last Rate Last Admin   acetaminophen (TYLENOL) tablet 650 mg  650 mg Oral Q6H PRN Tomie China, MD       alum & mag hydroxide-simeth (MAALOX/MYLANTA) 200-200-20 MG/5ML suspension 30 mL  30 mL Oral Q4H PRN Tomie China, MD       benztropine (COGENTIN)  tablet 0.5 mg  0.5 mg Oral BID Starleen Blue, NP   0.5 mg at 02/16/23 1610   haloperidol (HALDOL) tablet 5 mg  5 mg Oral TID PRN Phineas Inches, MD       And   diphenhydrAMINE (BENADRYL) capsule 50 mg  50 mg Oral TID PRN Massengill, Harrold Donath, MD       haloperidol lactate (HALDOL) injection 5 mg  5 mg Intramuscular TID PRN Massengill, Harrold Donath, MD       And   diphenhydrAMINE (BENADRYL) injection 50 mg  50 mg Intramuscular TID PRN Massengill, Harrold Donath, MD       And   LORazepam (ATIVAN) injection 2 mg  2 mg Intramuscular TID PRN Massengill, Harrold Donath, MD       haloperidol lactate (HALDOL) injection 10 mg  10 mg Intramuscular TID PRN Phineas Inches, MD  And   diphenhydrAMINE (BENADRYL) injection 50 mg  50 mg Intramuscular TID PRN Massengill, Harrold Donath, MD       And   LORazepam (ATIVAN) injection 2 mg  2 mg Intramuscular TID PRN Phineas Inches, MD       divalproex (DEPAKOTE ER) 24 hr tablet 750 mg  750 mg Oral QHS Massengill, Harrold Donath, MD   750 mg at 02/15/23 2136   haloperidol (HALDOL) tablet 10 mg  10 mg Oral QHS Starleen Blue, NP       [START ON 02/17/2023] haloperidol (HALDOL) tablet 5 mg  5 mg Oral Daily Alanys Godino, NP       hydrOXYzine (ATARAX) tablet 25 mg  25 mg Oral TID PRN Tomie China, MD   25 mg at 02/16/23 1256   magnesium hydroxide (MILK OF MAGNESIA) suspension 30 mL  30 mL Oral Daily PRN Tomie China, MD       traZODone (DESYREL) tablet 50 mg  50 mg Oral QHS PRN Tomie China, MD       Vitamin D (Ergocalciferol) (DRISDOL) 1.25 MG (50000 UNIT) capsule 50,000 Units  50,000 Units Oral Q7 days Starleen Blue, NP       white petrolatum (VASELINE) gel   Topical PRN Starleen Blue, NP        Lab Results:  Results for orders placed or performed during the hospital encounter of 02/14/23 (from the past 48 hour(s))  TSH     Status: None   Collection Time: 02/16/23  6:39 AM  Result Value Ref Range   TSH 1.196 0.350 - 4.500 uIU/mL    Comment: Performed by a 3rd  Generation assay with a functional sensitivity of <=0.01 uIU/mL. Performed at [facility], [address], [place], [place] [address]   VITAMIN D 25 Hydroxy (Vit-D Deficiency, Fractures)     Status: Abnormal   Collection Time: 02/16/23  6:39 AM  Result Value Ref Range   Vit D, 25-Hydroxy 15.13 (L) 30 - 100 ng/mL    Comment: (NOTE) Vitamin D deficiency has been defined by the Institute of Medicine  and an Endocrine Society practice guideline as a level of serum 25-OH  vitamin D less than 20 ng/mL (1,2). The Endocrine Society went on to  further define vitamin D insufficiency as a level between 21 and 29  ng/mL (2).  1. IOM (Institute of Medicine). 2010. Dietary reference intakes for  calcium and D. [place]: [employer]. 2. Holick MF, Binkley [place], Bischoff-Ferrari HA, et al. Evaluation,  treatment, and prevention of vitamin D deficiency: an Endocrine  Society clinical practice guideline, JCEM. 2011 Jul; 96(7): 1911-30.  Performed at [facility], [address], [place], [place] [address]     Blood Alcohol level:  Lab Results  Component Value Date   [facility] <10 02/11/2023   ETH <10 02/06/2023    Metabolic Disorder Labs: Lab Results  Component Value Date   HGBA1C 5.3 02/11/2023   MPG 105.41 02/11/2023   MPG 117 11/27/2014   Lab Results  Component Value Date   PROLACTIN 57.5 (H) 08/20/2015   PROLACTIN 23.2 (H) 02/04/2015   Lab Results  Component Value Date   CHOL 202 (H) 02/11/2023   TRIG 46 02/11/2023   HDL 83 02/11/2023   CHOLHDL 2.4 02/11/2023   VLDL 9 02/11/2023   LDLCALC 110 (H) 02/11/2023   LDLCALC 152 (H) 07/15/2015    Physical Findings: AIMS:  , ,  ,  ,    CIWA:  COWS:     Musculoskeletal: Strength & Muscle Tone: within normal limits Gait & Station: normal Patient leans: N/A  Psychiatric Specialty Exam:  Presentation  General Appearance:  Fairly Groomed  Eye Contact: Fair  Speech: Normal  Rate  Speech Volume: Decreased  Handedness: Right   Mood and Affect  Mood: Depressed; Anxious  Affect: Non-Congruent   Thought Process  Thought Processes: Disorganized  Descriptions of Associations:Tangential  Orientation:Partial  Thought Content:Illogical  History of Schizophrenia/Schizoaffective disorder:Yes  Duration of Psychotic Symptoms:Greater than six months  Hallucinations:Hallucinations: Auditory  Ideas of Reference:Paranoia  Suicidal Thoughts:Suicidal Thoughts: No  Homicidal Thoughts:Homicidal Thoughts: No   Sensorium  Memory: Recent Poor  Judgment: Poor  Insight: Poor   Executive Functions  Concentration: Fair  Attention Span: Fair  Recall: Poor  Fund of Knowledge: Poor  Language: Fair   Psychomotor Activity  Psychomotor Activity: Psychomotor Activity: Normal   Assets  Assets: Resilience; Social Support   Sleep  Sleep: Sleep: Good  Physical Exam: Physical Exam Vitals and nursing note reviewed.  Constitutional:      Appearance: Normal appearance.  Musculoskeletal:     Cervical back: Normal range of motion.  Neurological:     Mental Status: He is alert. He is disoriented.    ROS Blood pressure 107/79, pulse (!) 101, temperature 98.5 F (36.9 C), temperature source Oral, resp. rate 16, height 5\' 8"  (1.727 m), weight 74 kg, SpO2 100%. Body mass index is 24.81 kg/m.  Treatment Plan Summary: Daily contact with patient to assess and evaluate symptoms and progress in treatment and Medication management   Safety and Monitoring: Voluntary admission to inpatient psychiatric unit for safety, stabilization and treatment Daily contact with patient to assess and evaluate symptoms and progress in treatment Patient's case to be discussed in multi-disciplinary team meeting Observation Level : q15 minute checks Vital signs: q12 hours Precautions: Safety   Long Term Goal(s): Improvement in symptoms so as ready for  discharge   Short Term Goals: Ability to identify changes in lifestyle to reduce recurrence of condition will improve, Ability to verbalize feelings will improve, Ability to disclose and discuss suicidal ideas, Ability to demonstrate self-control will improve, Ability to identify and develop effective coping behaviors will improve, Ability to maintain clinical measurements within normal limits will improve, Compliance with prescribed medications will improve, and Ability to identify triggers associated with substance abuse/mental health issues will improve   Diagnoses Principal Problem:   Schizophrenia, paranoid type (HCC) Active Problems:   Cannabis use disorder, severe, dependence (HCC)   Borderline intellectual functioning   Cocaine use disorder (HCC)   Medications -Increase nightly Haldol from 5 mg to 10 mg & keep AM dose at 5 mg each morning for psychosis  -Start Vitamin D 50.000 units Q 7 days for low Vit D -Continue Depakote 750 mg nightly for mood stabilization -Continue Cogentin 0.5 mg BID for EPS prophylaxis   PRNS -Continue Hydroxyzine 25 mg TID PRN for anxiety -Continue Trazodone 50 mg nightly PRN for sleep -Continue agitation protocol as per the MAR (Benadryl, Haldol, Benadryl, PO/IM PRN) -Continue Tylenol 650 mg every 6 hours PRN for mild pain -Continue Maalox 30 mg every 4 hrs PRN for indigestion -Continue Milk of Magnesia as needed every 6 hrs for constipation   Labs Reviewed: TSH is within normal limits, vitamin D low at 15.13, ordered 50,000 units of vitamin D weekly   Discharge Planning: Social work and case management to assist with discharge planning and identification of hospital follow-up needs prior to  discharge Estimated LOS: 5-7 days Discharge Concerns: Need to establish a safety plan; Medication compliance and effectiveness Discharge Goals: Return home with outpatient referrals for mental health follow-up including medication management/psychotherapy   I  certify that inpatient services furnished can reasonably be expected to improve the patient's condition.    Starleen Blue, NP 02/16/2023, 3:20 PM

## 2023-02-16 NOTE — Group Note (Unsigned)
Date:  02/17/2023 Time:  1:04 AM  Group Topic/Focus:  Narcotics Anonymous (NA) Meeting    Participation Level:  Did Not Attend  Participation Quality:   N/A  Affect:   N/A  Cognitive:   N/A  Insight: None  Engagement in Group:   N/A  Modes of Intervention:   N/A  Additional Comments:  Patient did not attend NA Meeting.  [name] Love 02/17/2023, 1:04 AM

## 2023-02-17 DIAGNOSIS — F200 Paranoid schizophrenia: Secondary | ICD-10-CM | POA: Diagnosis not present

## 2023-02-17 MED ORDER — HALOPERIDOL 5 MG PO TABS
15.0000 mg | ORAL_TABLET | Freq: Every day | ORAL | Status: DC
  Administered 2023-02-17 – 2023-02-18 (×2): 15 mg via ORAL
  Filled 2023-02-17 (×4): qty 3

## 2023-02-17 NOTE — Progress Notes (Signed)
   02/17/23 2117  Psych Admission Type (Psych Patients Only)  Admission Status Voluntary  Psychosocial Assessment  Patient Complaints Anxiety;Suspiciousness  Eye Contact Brief  Facial Expression Flat  Affect Apathetic  Speech Tangential  Interaction Cautious;Guarded  Motor Activity Slow  Appearance/Hygiene Unremarkable  Behavior Characteristics Cooperative  Mood Anxious;Suspicious;Preoccupied  Thought Process  Coherency Disorganized;Tangential  Content Preoccupation;Paranoia  Delusions Paranoid  Perception Hallucinations  Hallucination Auditory  Judgment Limited  Confusion Mild  Danger to Self  Current suicidal ideation? Denies  Danger to Others  Danger to Others None reported or observed

## 2023-02-17 NOTE — Progress Notes (Signed)
   02/16/23 2300  Psych Admission Type (Psych Patients Only)  Admission Status Voluntary  Psychosocial Assessment  Patient Complaints Anxiety  Eye Contact Brief  Facial Expression Blank  Affect Appropriate to circumstance  Speech Tangential  Interaction Cautious  Motor Activity Slow  Appearance/Hygiene Unremarkable  Behavior Characteristics Cooperative  Mood Suspicious  Thought Process  Coherency Disorganized;Tangential  Content Preoccupation  Delusions Paranoid  Perception Hallucinations  Hallucination Auditory  Judgment Limited  Confusion Mild  Danger to Self  Current suicidal ideation? Denies  Self-Injurious Behavior No self-injurious ideation or behavior indicators observed or expressed   Agreement Not to Harm Self Yes  Description of Agreement Verbal  Danger to Others  Danger to Others None reported or observed

## 2023-02-17 NOTE — Progress Notes (Signed)
   02/17/23 0602  15 Minute Checks  Location Bedroom  Visual Appearance Calm  Behavior Sleeping  Sleep (Behavioral Health Patients Only)  Calculate sleep? (Click Yes once per 24 hr at 0600 safety check) Yes  Documented sleep last 24 hours 6.25

## 2023-02-17 NOTE — Plan of Care (Signed)
  Problem: Education: Goal: Mental status will improve Outcome: Not Progressing Goal: Verbalization of understanding the information provided will improve Outcome: Not Progressing   

## 2023-02-17 NOTE — Progress Notes (Signed)
[facility] MD Progress Note  02/17/2023 2:22 PM [name] Love  MRN:  [id number]  HPI: Pt is a 27 yo [place] American male with prior mental health diagnoses of P. Schizophrenia, Borderline Intellectual functioning, Cannabis use disorder who was transferred & admitted voluntarily to this [facility] on 12/02 after initially presenting to the [facility] on 11/29 with psychosis; Presented with disorganized thoughts, and making illogical statements, accompanied by his family members. As per documentation from the [facility]: "Pt states that he gets killed every time he states what's wrong. Pt states that his body talks to him, such as his butt. Pt denies SI, HI, AH and alcohol use at this present time. Pt endorses VH, stating that he sees letters and smoking synthetic herbs. Pt states that we can Vu help him by providing him with liquids and blood work."    Per nursing: Patient has thought blocking, staring spells, telling them he sees everyone as players in a game.  On my examination today, the patient still demonstrates a high level of psychosis.  He is disorganized, tangential.  His thoughts are mostly illogical.  He states that he was in a group session earlier earlier where they told him how to stay safe, and to do this he must drink lots of coffee, drink lots of water, and keep his clothes on.  He denies having auditory hallucinations but reported this yesterday, and there is concern for myself and staff that he is responding to internal stimuli.  At first the patient stated he had suicidal thoughts, but when getting more details of this is response was about animals and out about safety, self harm, suicide, or thoughts of death.  Denies any HI.  He is agreeable with increasing the Haldol.  We will have to get a valproic acid level in 1 to 2 days.  He otherwise denies having any side effects to his current psychiatric medications.  No TD/EPS type symptoms found on assessment, and pt denies any feelings of stiffness. AIMS:  0.   Principal Problem: Schizophrenia, paranoid type (HCC) Diagnosis: Principal Problem:   Schizophrenia, paranoid type (HCC) Active Problems:   Cannabis use disorder, severe, dependence (HCC)   Borderline intellectual functioning   Cocaine use disorder (HCC)  Total Time spent with patient: 45 minutes  Past Psychiatric History: See H & P  Past Medical History:  Past Medical History:  Diagnosis Date   Schizophrenia, acute (HCC)    History reviewed. No pertinent surgical history. Family History:  Family History  Problem Relation Age of Onset   Mental illness Cousin    Family Psychiatric  History: See H & P Social History:  Social History   Substance and Sexual Activity  Alcohol Use Yes   Comment: a little      Social History   Substance and Sexual Activity  Drug Use No   Comment: denies    Social History   Socioeconomic History   Marital status: Single    Spouse name: Not on file   Number of children: Not on file   Years of education: Not on file   Highest education level: Not on file  Occupational History   Not on file  Tobacco Use   Smoking status: Every Day    Current packs/day: 0.00    Types: Cigarettes   Smokeless tobacco: Never  Substance and Sexual Activity   Alcohol use: Yes    Comment: a little    Drug use: No    Comment: denies   Sexual  activity: Not Currently  Other Topics Concern   Not on file  Social History Narrative   Not on file   Social Determinants of Health   Financial Resource Strain: Not on file  Food Insecurity: Food Insecurity Present (02/14/2023)   Hunger Vital Sign    Worried About Running Out of Food in the Last Year: Sometimes true    Ran Out of Food in the Last Year: Sometimes true  Transportation Needs: Unmet Transportation Needs (02/14/2023)   PRAPARE - Administrator, Civil Service (Medical): Yes    Lack of Transportation (Non-Medical): Yes  Physical Activity: Not on file  Stress: Not on file  Social  Connections: Not on file     Current Medications: Current Facility-Administered Medications  Medication Dose Route Frequency Provider Last Rate Last Admin   acetaminophen (TYLENOL) tablet 650 mg  650 mg Oral Q6H PRN Tomie China, MD   650 mg at 02/16/23 2159   alum & mag hydroxide-simeth (MAALOX/MYLANTA) 200-200-20 MG/5ML suspension 30 mL  30 mL Oral Q4H PRN Tomie China, MD       benztropine (COGENTIN) tablet 0.5 mg  0.5 mg Oral BID Starleen Blue, NP   0.5 mg at 02/17/23 5784   haloperidol (HALDOL) tablet 5 mg  5 mg Oral TID PRN Phineas Inches, MD       And   diphenhydrAMINE (BENADRYL) capsule 50 mg  50 mg Oral TID PRN Jozeph Persing, Harrold Donath, MD       haloperidol lactate (HALDOL) injection 5 mg  5 mg Intramuscular TID PRN Ladarious Kresse, Harrold Donath, MD       And   diphenhydrAMINE (BENADRYL) injection 50 mg  50 mg Intramuscular TID PRN Verna Hamon, Harrold Donath, MD       And   LORazepam (ATIVAN) injection 2 mg  2 mg Intramuscular TID PRN Carisha Kantor, Harrold Donath, MD       haloperidol lactate (HALDOL) injection 10 mg  10 mg Intramuscular TID PRN Yuvraj Pfeifer, Harrold Donath, MD       And   diphenhydrAMINE (BENADRYL) injection 50 mg  50 mg Intramuscular TID PRN Drue Camera, Harrold Donath, MD       And   LORazepam (ATIVAN) injection 2 mg  2 mg Intramuscular TID PRN Constance Hackenberg, Harrold Donath, MD       divalproex (DEPAKOTE ER) 24 hr tablet 750 mg  750 mg Oral QHS Kavonte Bearse, Harrold Donath, MD   750 mg at 02/16/23 2125   haloperidol (HALDOL) tablet 15 mg  15 mg Oral QHS Shakiyla Kook, MD       haloperidol (HALDOL) tablet 5 mg  5 mg Oral Daily Nkwenti, Doris, NP   5 mg at 02/17/23 6962   hydrOXYzine (ATARAX) tablet 25 mg  25 mg Oral TID PRN Tomie China, MD   25 mg at 02/16/23 1256   magnesium hydroxide (MILK OF MAGNESIA) suspension 30 mL  30 mL Oral Daily PRN Tomie China, MD       traZODone (DESYREL) tablet 50 mg  50 mg Oral QHS PRN Tomie China, MD   50 mg at 02/16/23 2159   Vitamin D (Ergocalciferol)  (DRISDOL) 1.25 MG (50000 UNIT) capsule 50,000 Units  50,000 Units Oral Q7 days Starleen Blue, NP   50,000 Units at 02/16/23 2126   white petrolatum (VASELINE) gel   Topical PRN Starleen Blue, NP        Lab Results:  Results for orders placed or performed during the hospital encounter of 02/14/23 (from the past 48 hour(s))  TSH     Status: None  Collection Time: 02/16/23  6:39 AM  Result Value Ref Range   TSH 1.196 0.350 - 4.500 uIU/mL    Comment: Performed by a 3rd Generation assay with a functional sensitivity of <=0.01 uIU/mL. Performed at [facility], [address] [place], [place] [address]   VITAMIN D 25 Hydroxy (Vit-D Deficiency, Fractures)     Status: Abnormal   Collection Time: 02/16/23  6:39 AM  Result Value Ref Range   Vit D, 25-Hydroxy 15.13 (L) 30 - 100 ng/mL    Comment: (NOTE) Vitamin D deficiency has been defined by the Institute of Medicine  and an Endocrine Society practice guideline as a level of serum 25-OH  vitamin D less than 20 ng/mL (1,2). The Endocrine Society went on to  further define vitamin D insufficiency as a level between 21 and 29  ng/mL (2).  1. IOM (Institute of Medicine). 2010. Dietary reference intakes for  calcium and D. [place]: [employer]. 2. Holick MF, Binkley [place], Bischoff-Ferrari HA, et al. Evaluation,  treatment, and prevention of vitamin D deficiency: an Endocrine  Society clinical practice guideline, JCEM. 2011 Jul; 96(7): 1911-30.  Performed at [facility], [address] [place], [place] [address]     Blood Alcohol level:  Lab Results  Component Value Date   [facility] <10 02/11/2023   ETH <10 02/06/2023    Metabolic Disorder Labs: Lab Results  Component Value Date   HGBA1C 5.3 02/11/2023   MPG 105.41 02/11/2023   MPG 117 11/27/2014   Lab Results  Component Value Date   PROLACTIN 57.5 (H) 08/20/2015   PROLACTIN 23.2 (H) 02/04/2015   Lab Results  Component Value Date    CHOL 202 (H) 02/11/2023   TRIG 46 02/11/2023   HDL 83 02/11/2023   CHOLHDL 2.4 02/11/2023   VLDL 9 02/11/2023   LDLCALC 110 (H) 02/11/2023   LDLCALC 152 (H) 07/15/2015    Physical Findings: AIMS:  , ,  ,  ,    CIWA:    COWS:     Musculoskeletal: Strength & Muscle Tone: within normal limits Gait & Station: normal Patient leans: N/A  Psychiatric Specialty Exam:  Presentation  General Appearance:  Casual; Disheveled  Eye Contact: Minimal  Speech: Slow  Speech Volume: Decreased  Handedness: Right   Mood and Affect  Mood: Anxious  Affect: Restricted   Thought Process  Thought Processes: Disorganized  Descriptions of Associations:Tangential  Orientation:Partial  Thought Content:Illogical; Tangential  History of Schizophrenia/Schizoaffective disorder:Yes  Duration of Psychotic Symptoms:Greater than six months  Hallucinations:Hallucinations: None  Ideas of Reference:Delusions  Suicidal Thoughts:Suicidal Thoughts: No  Homicidal Thoughts:Homicidal Thoughts: No   Sensorium  Memory: Immediate Poor; Recent Poor; Remote Poor  Judgment: Impaired  Insight: Lacking   Executive Functions  Concentration: Poor  Attention Span: Poor  Recall: Poor  Fund of Knowledge: Poor  Language: Fair   Psychomotor Activity  Psychomotor Activity: Psychomotor Activity: Normal   Assets  Assets: Resilience; Social Support   Sleep  Sleep: Sleep: Fair  Physical Exam: Physical Exam Vitals and nursing note reviewed.  Constitutional:      Appearance: Normal appearance. He is normal weight.  Musculoskeletal:     Cervical back: Normal range of motion.  Neurological:     Mental Status: He is alert. He is disoriented.     Motor: No weakness.     Gait: Gait normal.    Review of Systems  Constitutional:  Negative for chills and fever.  Cardiovascular:  Negative for chest pain and palpitations.  Neurological:  Negative for dizziness,  tingling, tremors and headaches.  Psychiatric/Behavioral:  Positive for hallucinations and memory loss. Negative for depression and suicidal ideas. The patient is not nervous/anxious and does not have insomnia.    Blood pressure (!) 132/90, pulse 85, temperature 97.8 F (36.6 C), temperature source Oral, resp. rate 18, height 5\' 8"  (1.727 m), weight 74 kg, SpO2 100%. Body mass index is 24.81 kg/m.  Treatment Plan Summary: Daily contact with patient to assess and evaluate symptoms and progress in treatment and Medication management   Assessment:  Diagnoses Principal Problem:   Schizophrenia, paranoid type (HCC) Active Problems:   Cannabis use disorder, severe, dependence (HCC)   Borderline intellectual functioning   Cocaine use disorder (HCC)   Plan: Safety and Monitoring: Voluntary admission to inpatient psychiatric unit for safety, stabilization and treatment Daily contact with patient to assess and evaluate symptoms and progress in treatment Patient's case to be discussed in multi-disciplinary team meeting Observation Level : q15 minute checks Vital signs: q12 hours Precautions: Safety   Medication plan: -Increase nightly Haldol from 10 mg to 15 mg -for psychosis -Continue morning Haldol at 5 mg - for psychosis  -Continue vitamin D 50.000 units Q 7 days for low Vit D -Continue Depakote 750 mg nightly for mood stabilization.  We will order valproic acid level in 1 to 2 days -Continue Cogentin 0.5 mg BID for EPS prophylaxis   PRNS -Continue Hydroxyzine 25 mg TID PRN for anxiety -Continue Trazodone 50 mg nightly PRN for sleep -Continue agitation protocol as per the MAR (Benadryl, Haldol, Benadryl, PO/IM PRN) -Continue Tylenol 650 mg every 6 hours PRN for mild pain -Continue Maalox 30 mg every 4 hrs PRN for indigestion -Continue Milk of Magnesia as needed every 6 hrs for constipation   Labs Reviewed: TSH is within normal limits, vitamin D low at 15.13, ordered 50,000 units  of vitamin D weekly   Discharge Planning: Social work and case management to assist with discharge planning and identification of hospital follow-up needs prior to discharge Estimated LOS: 5-7 days Discharge Concerns: Need to establish a safety plan; Medication compliance and effectiveness Discharge Goals: Return home with outpatient referrals for mental health follow-up including medication management/psychotherapy   I certify that inpatient services furnished can reasonably be expected to improve the patient's condition.    Phineas Inches, MD 02/17/2023, 2:22 PM  Total Time Spent in Direct Patient Care:  I personally spent 35 minutes on the unit in direct patient care. The direct patient care time included face-to-face time with the patient, reviewing the patient's chart, communicating with other professionals, and coordinating care. Greater than 50% of this time was spent in counseling or coordinating care with the patient regarding goals of hospitalization, psycho-education, and discharge planning needs.   Phineas Inches, MD Psychiatrist

## 2023-02-17 NOTE — Progress Notes (Signed)
DAR NOTE: Patient presents guarded, suspicious and paranoid.  Patient is observed responding to internal stimuli, making bizarre hand gestures.  Patient is disorganized with tangential thoughts process.  Rates depression at 9, hopelessness at 8, and anxiety at 7.  Maintained on routine safety checks.  Medications given as prescribed.  Support and encouragement offered as needed.  Attended group and participated but his responses were off topic.  States goal for today is "speed, no, but help defense."  Patient visible in milieu with suspicious stares.  Patient is safe on and off the unit.

## 2023-02-17 NOTE — Group Note (Signed)
Date:  02/17/2023 Time:  10:53 AM  Group Topic/Focus:  Goals Group:   The focus of this group is to help patients establish daily goals to achieve during treatment and discuss how the patient can incorporate goal setting into their daily lives to aide in recovery. Orientation:   The focus of this group is to educate the patient on the purpose and policies of crisis stabilization and provide a format to answer questions about their admission.  The group details unit policies and expectations of patients while admitted.    Participation Level:  Active  Participation Quality:  Appropriate  Affect:  Appropriate  Cognitive:  Appropriate  Insight: Appropriate  Engagement in Group:  Engaged  Modes of Intervention:  Discussion  Additional Comments:    Darrin Apodaca D Euline Kimbler 02/17/2023, 10:53 AM

## 2023-02-17 NOTE — Plan of Care (Signed)
  Problem: Activity: Goal: Interest or engagement in activities will improve Outcome: Progressing   Problem: Coping: Goal: Ability to demonstrate self-control will improve Outcome: Progressing   

## 2023-02-17 NOTE — Group Note (Signed)
LCSW Group Therapy Note  Group Date: 02/17/2023 Start Time: 1100 End Time: 1200   Type of Therapy and Topic:  Group Therapy - Healthy vs Unhealthy Coping Skills  Participation Level:  Active   Description of Group The focus of this group was to determine what unhealthy coping techniques typically are used by group members and what healthy coping techniques would be helpful in coping with various problems. Patients were guided in becoming aware of the differences between healthy and unhealthy coping techniques. Patients were asked to identify 2-3 healthy coping skills they would like to learn to use more effectively.  Therapeutic Goals Patients learned that coping is what human beings do all day long to deal with various situations in their lives Patients defined and discussed healthy vs unhealthy coping techniques Patients identified their preferred coping techniques and identified whether these were healthy or unhealthy Patients determined 2-3 healthy coping skills they would like to become more familiar with and use more often. Patients provided support and ideas to each other   Summary of Patient Progress:  During group, Bennie expressed healthy coping skills. Patient proved open to input from peers and feedback from CSW. Patient demonstrated some insight into the subject matter, was respectful of peers, and participated throughout the entire session.   Therapeutic Modalities Cognitive Behavioral Therapy Motivational Interviewing  Marinda Elk, [place] 02/17/2023  12:25 PM

## 2023-02-17 NOTE — [facility] Group Notes (Signed)
[facility] Group Notes:  (Nursing/MHT/Case Management/Adjunct)  Date:  02/17/2023  Time:  2000  Type of Therapy:   Wrap up group  Participation Level:  Active  Participation Quality:  Attentive and Sharing  Affect:  Anxious  Cognitive:  Disorganized  Insight:  Limited  Engagement in Group:  Developing/Improving  Modes of Intervention:  Clarification, Education, and Socialization  Summary of Progress/Problems:Positive thinking and self care were discussed. Pt reported not having anything to share but did end up participating. Some responses were appropriate and some were off topic or illogical.   Marcille Buffy 02/17/2023, 8:41 PM

## 2023-02-18 DIAGNOSIS — F200 Paranoid schizophrenia: Secondary | ICD-10-CM | POA: Diagnosis not present

## 2023-02-18 NOTE — Progress Notes (Signed)
   02/18/23 0547  15 Minute Checks  Location Bedroom  Visual Appearance Calm  Behavior Sleeping  Sleep (Behavioral Health Patients Only)  Calculate sleep? (Click Yes once per 24 hr at 0600 safety check) Yes  Documented sleep last 24 hours 6.75

## 2023-02-18 NOTE — Progress Notes (Signed)
  During patient's assessment, patient complained of moderate pain related to a headache. Patient also desired medication for sleep.  Administered PRN Tylenol & Trazodone, per [facility] per patient request.  The patient is safe on the unit at this time.  Will continue to monitor.

## 2023-02-18 NOTE — Progress Notes (Signed)
   02/18/23 2030  Psych Admission Type (Psych Patients Only)  Admission Status Voluntary  Psychosocial Assessment  Patient Complaints Suspiciousness;Worrying  Eye Contact Staring  Facial Expression Blank  Affect Apprehensive  Speech Tangential  Interaction Cautious;Guarded  Motor Activity Slow  Appearance/Hygiene Unremarkable  Behavior Characteristics Cooperative  Mood Anxious;Suspicious;Preoccupied  Thought Process  Coherency Disorganized;Tangential  Content Preoccupation;Paranoia  Delusions Paranoid  Perception Hallucinations  Hallucination Auditory  Judgment Limited  Confusion Mild  Danger to Self  Current suicidal ideation? Denies  Agreement Not to Harm Self Yes  Description of Agreement verbal  Danger to Others  Danger to Others None reported or observed

## 2023-02-18 NOTE — Group Note (Unsigned)
Date:  02/18/2023 Time:  8:46 PM  Group Topic/Focus:  Wrap-Up Group:   The focus of this group is to help patients review their daily goal of treatment and discuss progress on daily workbooks.     Participation Level:  {[facility] PARTICIPATION JXBJY:78295}  Participation Quality:  {[facility] PARTICIPATION QUALITY:22265}  Affect:  {[facility] AFFECT:22266}  Cognitive:  {[facility] COGNITIVE:22267}  Insight: {B[facility] Insight2:20797}  Engagement in Group:  {[facility] ENGAGEMENT IN AOZHY:86578}  Modes of Intervention:  {[facility] MODES OF INTERVENTION:22269}  Additional Comments:  ***  Scot Dock 02/18/2023, 8:46 PM

## 2023-02-18 NOTE — Progress Notes (Signed)
   02/18/23 1100  Psych Admission Type (Psych Patients Only)  Admission Status Voluntary  Psychosocial Assessment  Patient Complaints Suspiciousness  Eye Contact Brief  Facial Expression Blank  Affect Apprehensive  Speech Tangential  Interaction Guarded  Motor Activity Slow  Appearance/Hygiene Unremarkable  Behavior Characteristics Cooperative  Mood Preoccupied;Suspicious  Thought Process  Coherency Disorganized;Tangential  Content Preoccupation;Paranoia  Delusions Paranoid  Perception Hallucinations  Hallucination Auditory  Judgment Limited  Confusion Mild  Danger to Self  Current suicidal ideation? Denies  Danger to Others  Danger to Others None reported or observed

## 2023-02-18 NOTE — Progress Notes (Cosign Needed Addendum)
[facility] MD Progress Note  02/18/2023 2:21 PM [name] Love  MRN:  [id number]  HPI: Pt is a 27 yo [place] American male with prior mental health diagnoses of P. Schizophrenia, Borderline Intellectual functioning, Cannabis use disorder who was transferred & admitted voluntarily to this [facility] on 12/02 after initially presenting to the [facility] on 11/29 with psychosis; Presented with disorganized thoughts, and making illogical statements, accompanied by his family members. As per documentation from the [facility]: "Pt states that he gets killed every time he states what's wrong. Pt states that his body talks to him, such as his butt. Pt denies SI, HI, AH and alcohol use at this present time. Pt endorses VH, stating that he sees letters and smoking synthetic herbs. Pt states that we can [facility] help him by providing him with liquids and blood work."    24 hr chart review: Per nursing: Patient continues to have thought blocking & is going to make statements that are illogical. After group session last night, MHT notes: " Some responses were appropriate and some were off topic or illogical."  He is however staying compliant with medications, no agitation overnight, PRNs last night consisted of trazodone, hydroxyzine and Tylenol.  Vitals are within normal limits.  Patient assessment: Patient continues to have illogical thoughts, he is incoherent, is tangential, presents with flight of ideas, is nonlinear in his responses to questions.  Perseverates with unrelated topics when questions are being asked.  Continues to stare as if he is looking at someone or something during assessment, mood is depressed, affect is congruent and flat.  Patient reports auditory hallucinations of "dog voices."  He denies visual hallucinations, continues to state that people know what he is thinking.  Patient is asked if feeling suicidal or homicidal, in response is "I got to eat my hot dog with Ketchup." Eventually, question is repeated, and he denies  SI/HI/VH.  He is asked when he last heard the voices, and states "the day I got charged, and at my tacos." Pt [place] asked who charged him, and states: "the dogs." States that they are out to get him because they think that he is a celebrity and is popular.   He reports a good sleep quality last night, states that he is eating well, denies being in any physical pain, states last bowel movement was yesterday. No TD/EPS type symptoms found on assessment, and pt denies any feelings of stiffness. AIMS: 0.   Haldol was increased yesterday to 15 mg, and we are allowing patient to be at this dose for a few days, prior to deciding if we need to adjust dose again.  We will continue to monitor psychosis, but psychosis is continuing to be resistant at this time, and patient continues to require inpatient hospitalization.  Discharge planning will be revisited as symptoms begin to resolve.  As per CSW, patient has been referred for an ACT team, and we will follow up with this during treatment huddle in the morning. Continuing medications as listed below with no changes today.   Principal Problem: Schizophrenia, paranoid type (HCC) Diagnosis: Principal Problem:   Schizophrenia, paranoid type (HCC) Active Problems:   Cannabis use disorder, severe, dependence (HCC)   Borderline intellectual functioning   Cocaine use disorder (HCC)  Total Time spent with patient: 45 minutes  Past Psychiatric History: See H & P  Past Medical History:  Past Medical History:  Diagnosis Date   Schizophrenia, acute (HCC)    History reviewed. No pertinent surgical history. Family  History:  Family History  Problem Relation Age of Onset   Mental illness Cousin    Family Psychiatric  History: See H & P Social History:  Social History   Substance and Sexual Activity  Alcohol Use Yes   Comment: a little      Social History   Substance and Sexual Activity  Drug Use No   Comment: denies    Social History   Socioeconomic  History   Marital status: Single    Spouse name: Not on file   Number of children: Not on file   Years of education: Not on file   Highest education level: Not on file  Occupational History   Not on file  Tobacco Use   Smoking status: Every Day    Current packs/day: 0.00    Types: Cigarettes   Smokeless tobacco: Never  Substance and Sexual Activity   Alcohol use: Yes    Comment: a little    Drug use: No    Comment: denies   Sexual activity: Not Currently  Other Topics Concern   Not on file  Social History Narrative   Not on file   Social Determinants of Health   Financial Resource Strain: Not on file  Food Insecurity: Food Insecurity Present (02/14/2023)   Hunger Vital Sign    Worried About Running Out of Food in the Last Year: Sometimes true    Ran Out of Food in the Last Year: Sometimes true  Transportation Needs: Unmet Transportation Needs (02/14/2023)   PRAPARE - Administrator, Civil Service (Medical): Yes    Lack of Transportation (Non-Medical): Yes  Physical Activity: Not on file  Stress: Not on file  Social Connections: Not on file   Current Medications: Current Facility-Administered Medications  Medication Dose Route Frequency Provider Last Rate Last Admin   acetaminophen (TYLENOL) tablet 650 mg  650 mg Oral Q6H PRN Tomie China, MD   650 mg at 02/17/23 2117   alum & mag hydroxide-simeth (MAALOX/MYLANTA) 200-200-20 MG/5ML suspension 30 mL  30 mL Oral Q4H PRN Tomie China, MD       benztropine (COGENTIN) tablet 0.5 mg  0.5 mg Oral BID Starleen Blue, NP   0.5 mg at 02/18/23 0913   haloperidol (HALDOL) tablet 5 mg  5 mg Oral TID PRN Phineas Inches, MD       And   diphenhydrAMINE (BENADRYL) capsule 50 mg  50 mg Oral TID PRN Massengill, Harrold Donath, MD       haloperidol lactate (HALDOL) injection 5 mg  5 mg Intramuscular TID PRN Massengill, Harrold Donath, MD       And   diphenhydrAMINE (BENADRYL) injection 50 mg  50 mg Intramuscular TID PRN  Massengill, Harrold Donath, MD       And   LORazepam (ATIVAN) injection 2 mg  2 mg Intramuscular TID PRN Massengill, Harrold Donath, MD       haloperidol lactate (HALDOL) injection 10 mg  10 mg Intramuscular TID PRN Massengill, Harrold Donath, MD       And   diphenhydrAMINE (BENADRYL) injection 50 mg  50 mg Intramuscular TID PRN Massengill, Harrold Donath, MD       And   LORazepam (ATIVAN) injection 2 mg  2 mg Intramuscular TID PRN Massengill, Harrold Donath, MD       divalproex (DEPAKOTE ER) 24 hr tablet 750 mg  750 mg Oral QHS Massengill, Harrold Donath, MD   750 mg at 02/17/23 2113   haloperidol (HALDOL) tablet 15 mg  15 mg Oral QHS Massengill,  Harrold Donath, MD   15 mg at 02/17/23 2114   haloperidol (HALDOL) tablet 5 mg  5 mg Oral Daily Starleen Blue, NP   5 mg at 02/18/23 0913   hydrOXYzine (ATARAX) tablet 25 mg  25 mg Oral TID PRN Tomie China, MD   25 mg at 02/18/23 1313   magnesium hydroxide (MILK OF MAGNESIA) suspension 30 mL  30 mL Oral Daily PRN Tomie China, MD       traZODone (DESYREL) tablet 50 mg  50 mg Oral QHS PRN Tomie China, MD   50 mg at 02/17/23 2114   Vitamin D (Ergocalciferol) (DRISDOL) 1.25 MG (50000 UNIT) capsule 50,000 Units  50,000 Units Oral Q7 days Starleen Blue, NP   50,000 Units at 02/16/23 2126   white petrolatum (VASELINE) gel   Topical PRN Starleen Blue, NP       Lab Results:  No results found for this or any previous visit (from the past 48 hour(s)).  Blood Alcohol level:  Lab Results  Component Value Date   ETH <10 02/11/2023   ETH <10 02/06/2023   Metabolic Disorder Labs: Lab Results  Component Value Date   HGBA1C 5.3 02/11/2023   MPG 105.41 02/11/2023   MPG 117 11/27/2014   Lab Results  Component Value Date   PROLACTIN 57.5 (H) 08/20/2015   PROLACTIN 23.2 (H) 02/04/2015   Lab Results  Component Value Date   CHOL 202 (H) 02/11/2023   TRIG 46 02/11/2023   HDL 83 02/11/2023   CHOLHDL 2.4 02/11/2023   VLDL 9 02/11/2023   LDLCALC 110 (H) 02/11/2023   LDLCALC 152 (H)  07/15/2015   Physical Findings: AIMS: 0 CIWA:  n/a COWS:  n/a  Musculoskeletal: Strength & Muscle Tone: within normal limits Gait & Station: normal Patient leans: N/A  Psychiatric Specialty Exam:  Presentation  General Appearance:  Casual; Fairly Groomed  Eye Contact: Fair  Speech: Blocked  Speech Volume: Decreased  Handedness: Right   Mood and Affect  Mood: Depressed  Affect: Congruent  Thought Process  Thought Processes: Disorganized; Irrevelant  Descriptions of Associations:Tangential  Orientation:Partial  Thought Content:Illogical; Paranoid Ideation  History of Schizophrenia/Schizoaffective disorder:Yes  Duration of Psychotic Symptoms:Greater than six months  Hallucinations:Hallucinations: Auditory  Ideas of Reference:Paranoia  Suicidal Thoughts:Suicidal Thoughts: No  Homicidal Thoughts:Homicidal Thoughts: No  Sensorium  Memory: Immediate Poor  Judgment: Poor  Insight: Poor  Executive Functions  Concentration: Fair  Attention Span: Fair  Recall: Poor  Fund of Knowledge: Poor  Language: Fair   Psychomotor Activity  Psychomotor Activity: Psychomotor Activity: Normal   Assets  Assets: Resilience; Social Support   Sleep  Sleep: Sleep: Good  Physical Exam: Physical Exam Vitals and nursing note reviewed.  Constitutional:      Appearance: Normal appearance. He is normal weight.  Musculoskeletal:     Cervical back: Normal range of motion.  Neurological:     Mental Status: He is alert. He is disoriented.     Motor: No weakness.     Gait: Gait normal.    Review of Systems  Constitutional:  Negative for chills and fever.  Cardiovascular:  Negative for chest pain and palpitations.  Neurological:  Negative for dizziness, tingling, tremors and headaches.  Psychiatric/Behavioral:  Positive for hallucinations, memory loss and substance abuse. Negative for depression and suicidal ideas. The patient is not  nervous/anxious and does not have insomnia.    Blood pressure 138/80, pulse 93, temperature 98.5 F (36.9 C), temperature source Oral, resp. rate 18, height 5\' 8"  (1.727  m), weight 74 kg, SpO2 98%. Body mass index is 24.81 kg/m.  Treatment Plan Summary: Daily contact with patient to assess and evaluate symptoms and progress in treatment and Medication management   Diagnoses Principal Problem:   Schizophrenia, paranoid type (HCC) Active Problems:   Cannabis use disorder, severe, dependence (HCC)   Borderline intellectual functioning   Cocaine use disorder (HCC)   Plan: Safety and Monitoring: Voluntary admission to inpatient psychiatric unit for safety, stabilization and treatment Daily contact with patient to assess and evaluate symptoms and progress in treatment Patient's case to be discussed in multi-disciplinary team meeting Observation Level : q15 minute checks Vital signs: q12 hours Precautions: Safety   Medication plan: -Continue Haldol 15 mg nightly for psychosis -Continue morning Haldol at 5 mg -for psychosis  -Continue vitamin D 50.000 units Q 7 days for low Vit D -Continue Depakote 750 mg nightly for mood stabilization.-valproic acid level in  1 days-12/7 @ 1830 -Continue Cogentin 0.5 mg BID for EPS prophylaxis   PRNS -Continue Hydroxyzine 25 mg TID PRN for anxiety -Continue Trazodone 50 mg nightly PRN for sleep -Continue agitation protocol as per the MAR (Benadryl, Haldol, Benadryl, PO/IM PRN) -Continue Tylenol 650 mg every 6 hours PRN for mild pain -Continue Maalox 30 mg every 4 hrs PRN for indigestion -Continue Milk of Magnesia as needed every 6 hrs for constipation   Labs Reviewed: No new orders today   Discharge Planning: Social work and case management to assist with discharge planning and identification of hospital follow-up needs prior to discharge Estimated LOS: 5-7 days Discharge Concerns: Need to establish a safety plan; Medication compliance and  effectiveness Discharge Goals: Return home with outpatient referrals for mental health follow-up including medication management/psychotherapy   I certify that inpatient services furnished can reasonably be expected to improve the patient's condition.    Starleen Blue, NP 02/18/2023, 2:21 PM  Total Time Spent in Direct Patient Care:  I personally spent 45 minutes on the unit in direct patient care. The direct patient care time included face-to-face time with the patient, reviewing the patient's chart, communicating with other professionals, and coordinating care. Greater than 50% of this time was spent in counseling or coordinating care with the patient regarding goals of hospitalization, psycho-education, and discharge planning needs.   Patient ID: Merrilyn Puma, male   DOB: [birth year], 27 y.o.   MRN: [id number]

## 2023-02-18 NOTE — BHH Group Notes (Signed)
The focus of this group is to help patients establish daily goals to achieve during treatment and discuss how the patient can incorporate goal setting into their daily lives to aide in recovery.    Scale 1-10 1 out of 10    Goal: Grow

## 2023-02-18 NOTE — Plan of Care (Signed)
  Problem: Activity: Goal: Interest or engagement in activities will improve Outcome: Progressing   Problem: Safety: Goal: Periods of time without injury will increase Outcome: Progressing   

## 2023-02-18 NOTE — Group Note (Unsigned)
Date:  02/18/2023 Time:  9:01 PM  Group Topic/Focus:  Wrap-Up Group:   The focus of this group is to help patients review their daily goal of treatment and discuss progress on daily workbooks.     Participation Level:  {BHH PARTICIPATION HYQMV:78469}  Participation Quality:  {BHH PARTICIPATION QUALITY:22265}  Affect:  {BHH AFFECT:22266}  Cognitive:  {BHH COGNITIVE:22267}  Insight: {BHH Insight2:20797}  Engagement in Group:  {BHH ENGAGEMENT IN GEXBM:84132}  Modes of Intervention:  {BHH MODES OF INTERVENTION:22269}  Additional Comments:  ***  Scot Dock 02/18/2023, 9:01 PM

## 2023-02-18 NOTE — Progress Notes (Signed)
     02/18/2023       2:47 PM   Trevar Brunty   Type of Note: ACTT referrals  [facility]: Patient used to have this ACTT team but left "on his own terms." Genta with [facility] will talk with the intake team Monday morning and will call back to let this writer know if they can accept patient back to program. CSW will continue to update.  [facility]: Referral sent 02/17/23, reached out today, they are accepting new clients however it can take 3-4 weeks before intake coordinator can do assessment with patient.   [facility]: Not accepting new clients currently.  [facility]: Not accepting new clients currently.    Signed:  Datra Clary, LCSW-A 02/18/2023  2:47 PM

## 2023-02-18 NOTE — Group Note (Unsigned)
Date:  02/18/2023 Time:  8:38 PM  Group Topic/Focus:  Wrap-Up Group:   The focus of this group is to help patients review their daily goal of treatment and discuss progress on daily workbooks.     Participation Level:  {BHH PARTICIPATION ZOXWR:60454}  Participation Quality:  {BHH PARTICIPATION QUALITY:22265}  Affect:  {BHH AFFECT:22266}  Cognitive:  {BHH COGNITIVE:22267}  Insight: {BHH Insight2:20797}  Engagement in Group:  {BHH ENGAGEMENT IN UJWJX:91478}  Modes of Intervention:  {BHH MODES OF INTERVENTION:22269}  Additional Comments:  ***  Scot Dock 02/18/2023, 8:38 PM

## 2023-02-18 NOTE — Group Note (Unsigned)
Date:  02/18/2023 Time:  8:30 PM  Group Topic/Focus:  Wrap-Up Group:   The focus of this group is to help patients review their daily goal of treatment and discuss progress on daily workbooks.     Participation Level:  {BHH PARTICIPATION WNUUV:25366}  Participation Quality:  {BHH PARTICIPATION QUALITY:22265}  Affect:  {BHH AFFECT:22266}  Cognitive:  {BHH COGNITIVE:22267}  Insight: {BHH Insight2:20797}  Engagement in Group:  {BHH ENGAGEMENT IN YQIHK:74259}  Modes of Intervention:  {BHH MODES OF INTERVENTION:22269}  Additional Comments:  ***  Scot Dock 02/18/2023, 8:30 PM

## 2023-02-18 NOTE — Group Note (Signed)
Recreation Therapy Group Note   Group Topic:Problem Solving  Group Date: 02/18/2023 Start Time: 0930 End Time: 0950 Facilitators: Adylee Leonardo-McCall, LRT,CTRS Location: [address] Dayroom   Group Topic: Communication, Team Building, Problem Solving  Goal Area(s) Addresses:  Patient will effectively work with peer towards shared goal.  Patient will identify skills used to make activity successful.  Patient will identify how skills used during activity can be applied to reach post d/c goals.   Intervention: STEM Activity- Glass blower/designer  Group Description: Tallest Pharmacist, community. In teams of 5-6, patients were given 11 craft pipe cleaners. Using the materials provided, patients were instructed to compete again the opposing team(s) to build the tallest free-standing structure from floor level. The activity was timed; difficulty increased by Clinical research associate as Production designer, theatre/television/film continued.  Systematically resources were removed with additional directions for example, placing one arm behind their back, working in silence, and shape stipulations. LRT facilitated post-activity discussion reviewing team processes and necessary communication skills involved in completion. Patients were encouraged to reflect how the skills utilized, or not utilized, in this activity can be incorporated to positively impact support systems post discharge.  Education: Pharmacist, community, Scientist, physiological, Discharge Planning   Education Outcome: Acknowledges education/In group clarification offered/Needs additional education.    Affect/Mood: Appropriate   Participation Level: Engaged   Participation Quality: Independent   Behavior: Appropriate   Speech/Thought Process: Focused   Insight: Good   Judgement: Good   Modes of Intervention: STEM Activity   Patient Response to Interventions:  Engaged   Education Outcome:  In group clarification offered    Clinical Observations/Individualized Feedback: Pt  worked well with peer in coming up with ideas. Pt was attentive to suggestions from peer but also made suggestions. Pt ended up working on activity alone due peer being called out of group.     Plan: Continue to engage patient in RT group sessions 2-3x/week.   Ceciley Buist-McCall, LRT,CTRS 02/18/2023 11:45 AM

## 2023-02-19 DIAGNOSIS — F200 Paranoid schizophrenia: Secondary | ICD-10-CM | POA: Diagnosis not present

## 2023-02-19 LAB — VALPROIC ACID LEVEL: Valproic Acid Lvl: 39 ug/mL — ABNORMAL LOW (ref 50.0–100.0)

## 2023-02-19 MED ORDER — HALOPERIDOL LACTATE 2 MG/ML PO CONC
15.0000 mg | Freq: Every day | ORAL | Status: DC
  Administered 2023-02-19 – 2023-03-02 (×12): 15 mg via ORAL
  Filled 2023-02-19 (×15): qty 10

## 2023-02-19 MED ORDER — HALOPERIDOL LACTATE 2 MG/ML PO CONC
5.0000 mg | Freq: Every day | ORAL | Status: DC
  Administered 2023-02-20 – 2023-03-03 (×12): 5 mg via ORAL
  Filled 2023-02-19 (×16): qty 5

## 2023-02-19 NOTE — Progress Notes (Signed)
   02/19/23 0558  15 Minute Checks  Location Bedroom  Visual Appearance Calm  Behavior Sleeping  Sleep (Behavioral Health Patients Only)  Calculate sleep? (Click Yes once per 24 hr at 0600 safety check) Yes  Documented sleep last 24 hours 8

## 2023-02-19 NOTE — Progress Notes (Signed)
   02/19/23 2100  Psych Admission Type (Psych Patients Only)  Admission Status Voluntary  Psychosocial Assessment  Patient Complaints Anxiety;Suspiciousness  Eye Contact Staring  Facial Expression Blank  Affect Apprehensive  Speech Tangential  Interaction Cautious;Guarded  Motor Activity Slow  Appearance/Hygiene Unremarkable  Behavior Characteristics Cooperative  Mood Anxious;Suspicious  Thought Process  Coherency Disorganized;Tangential  Content Preoccupation;Paranoia  Delusions Paranoid  Perception Hallucinations  Hallucination Auditory  Judgment Limited  Confusion Mild  Danger to Self  Current suicidal ideation? Denies  Agreement Not to Harm Self Yes  Description of Agreement verbal  Danger to Others  Danger to Others None reported or observed

## 2023-02-19 NOTE — Plan of Care (Signed)

## 2023-02-19 NOTE — Plan of Care (Signed)
  Problem: Activity: Goal: Interest or engagement in activities will improve Outcome: Progressing   Problem: Safety: Goal: Periods of time without injury will increase Outcome: Progressing   

## 2023-02-19 NOTE — Progress Notes (Signed)
[facility] MD Progress Note  02/19/2023 10:45 AM [name] Love  MRN:  [id number]  Subjective:   Pt is a 27 yo African American male with prior mental health diagnoses of P. Schizophrenia, Borderline Intellectual functioning, Cannabis use disorder who was transferred & admitted voluntarily to this [facility] on 12/02 after initially presenting to the [facility] on 11/29 with psychosis; Presented with disorganized thoughts, and making illogical statements, accompanied by his family members. As per documentation from the [facility]: "Pt states that he gets killed every time he states what's wrong. Pt states that his body talks to him, such as his butt. Pt denies SI, HI, AH and alcohol use at this present time. Pt endorses VH, stating that he sees letters and smoking synthetic herbs. Pt states that we can Lacerda help him by providing him with liquids and blood work."    On interview today, the patient is still very psychotic.  He states that he slept "like a baby.  For 1 hour.  Then I was up walking for 8 hours.  It was great.  I slept so well.".  He reports for breakfast that he had "doubled eggs.  And bacon.  No bacon.  [name] Love.  No bacon.".  Patient response to most questions regarding symptoms in either a tangential or illogical manner.  He does state that he feels anxious.  He does report having auditory hallucinations but does not give this Clinical research associate details about the auditory hallucinations.  He is agreeable to switching his Haldol to liquid.  I told him I was concerned that he was not taking his medication, he did not respond.  He does deny having HI and HI.  At the end of the interview, I asked if the patient had any more questions any states "you like females right?".     Principal Problem: Schizophrenia, paranoid type (HCC) Diagnosis: Principal Problem:   Schizophrenia, paranoid type (HCC) Active Problems:   Cannabis use disorder, severe, dependence (HCC)   Borderline intellectual functioning   Cocaine use disorder  (HCC)  Total Time spent with patient: 20 minutes  Past Psychiatric History:  Previous Psych Diagnoses: P. Schizophrenia  Prior inpatient treatment: Multiple times at this hospital on the C/A unit: 10/26/2023, 11/25/2014, 01/30/2025, 08/07/2015, 08/17/2015, 09/28/2015, and on the adult unit at this hospital on 03/23/2016, 03/22/2017. Current/prior outpatient treatment: [facility]er, [facility].  Prior rehab hx: Denies  Psychotherapy hx: [facility] History of suicide attempt: Denies  History of homicide or aggression: As per collateral information obtained by the [facility], pt has aggressive behaviors at home  Psychiatric medication history: Abilify LAI in the past, Ambien, Depakote, Cogentin, Haldol, Ambien in the past as per chart review. Psychiatric medication compliance history: non compliant as per chart review Neuromodulation history: Denies  Current Psychiatrist:[facility] Current therapist: [facility]    Past Medical History:  Past Medical History:  Diagnosis Date   Schizophrenia, acute (HCC)    History reviewed. No pertinent surgical history. Family History:  Family History  Problem Relation Age of Onset   Mental illness Cousin    Family Psychiatric  History: See H&P  Social History:  Social History   Substance and Sexual Activity  Alcohol Use Yes   Comment: a little      Social History   Substance and Sexual Activity  Drug Use No   Comment: denies    Social History   Socioeconomic History   Marital status: Single    Spouse name: Not on file   Number of  children: Not on file   Years of education: Not on file   Highest education level: Not on file  Occupational History   Not on file  Tobacco Use   Smoking status: Every Day    Current packs/day: 0.00    Types: Cigarettes   Smokeless tobacco: Never  Substance and Sexual Activity   Alcohol use: Yes    Comment: a little    Drug use: No    Comment: denies   Sexual activity: Not Currently  Other Topics  Concern   Not on file  Social History Narrative   Not on file   Social Determinants of Health   Financial Resource Strain: Not on file  Food Insecurity: Food Insecurity Present (02/14/2023)   Hunger Vital Sign    Worried About Running Out of Food in the Last Year: Sometimes true    Ran Out of Food in the Last Year: Sometimes true  Transportation Needs: Unmet Transportation Needs (02/14/2023)   PRAPARE - Administrator, Civil Service (Medical): Yes    Lack of Transportation (Non-Medical): Yes  Physical Activity: Not on file  Stress: Not on file  Social Connections: Not on file   Additional Social History:                           Current Medications: Current Facility-Administered Medications  Medication Dose Route Frequency Provider Last Rate Last Admin   acetaminophen (TYLENOL) tablet 650 mg  650 mg Oral Q6H PRN Tomie China, MD   650 mg at 02/17/23 2117   alum & mag hydroxide-simeth (MAALOX/MYLANTA) 200-200-20 MG/5ML suspension 30 mL  30 mL Oral Q4H PRN Tomie China, MD       benztropine (COGENTIN) tablet 0.5 mg  0.5 mg Oral BID Starleen Blue, NP   0.5 mg at 02/19/23 0820   haloperidol (HALDOL) tablet 5 mg  5 mg Oral TID PRN Phineas Inches, MD       And   diphenhydrAMINE (BENADRYL) capsule 50 mg  50 mg Oral TID PRN Isaiah Cianci, Harrold Donath, MD       haloperidol lactate (HALDOL) injection 5 mg  5 mg Intramuscular TID PRN Hiroto Saltzman, Harrold Donath, MD       And   diphenhydrAMINE (BENADRYL) injection 50 mg  50 mg Intramuscular TID PRN Cintia Gleed, Harrold Donath, MD       And   LORazepam (ATIVAN) injection 2 mg  2 mg Intramuscular TID PRN Ovetta Bazzano, Harrold Donath, MD       haloperidol lactate (HALDOL) injection 10 mg  10 mg Intramuscular TID PRN Kalid Ghan, Harrold Donath, MD       And   diphenhydrAMINE (BENADRYL) injection 50 mg  50 mg Intramuscular TID PRN Airiana Elman, Harrold Donath, MD       And   LORazepam (ATIVAN) injection 2 mg  2 mg Intramuscular TID PRN Willies Laviolette, Harrold Donath,  MD       divalproex (DEPAKOTE ER) 24 hr tablet 750 mg  750 mg Oral QHS Taysean Wager, MD   750 mg at 02/18/23 2032   haloperidol (HALDOL) 2 MG/ML solution 15 mg  15 mg Oral QHS Medard Decuir, Harrold Donath, MD       Melene Muller ON 02/20/2023] haloperidol (HALDOL) 2 MG/ML solution 5 mg  5 mg Oral Q0600 Graclynn Vanantwerp, MD       hydrOXYzine (ATARAX) tablet 25 mg  25 mg Oral TID PRN Tomie China, MD   25 mg at 02/18/23 1313   magnesium hydroxide (MILK OF MAGNESIA) suspension 30  mL  30 mL Oral Daily PRN Tomie China, MD       traZODone (DESYREL) tablet 50 mg  50 mg Oral QHS PRN Tomie China, MD   50 mg at 02/18/23 2032   Vitamin D (Ergocalciferol) (DRISDOL) 1.25 MG (50000 UNIT) capsule 50,000 Units  50,000 Units Oral Q7 days Starleen Blue, NP   50,000 Units at 02/16/23 2126   white petrolatum (VASELINE) gel   Topical PRN Starleen Blue, NP        Lab Results: No results found for this or any previous visit (from the past 48 hour(s)).  Blood Alcohol level:  Lab Results  Component Value Date   ETH <10 02/11/2023   ETH <10 02/06/2023    Metabolic Disorder Labs: Lab Results  Component Value Date   HGBA1C 5.3 02/11/2023   MPG 105.41 02/11/2023   MPG 117 11/27/2014   Lab Results  Component Value Date   PROLACTIN 57.5 (H) 08/20/2015   PROLACTIN 23.2 (H) 02/04/2015   Lab Results  Component Value Date   CHOL 202 (H) 02/11/2023   TRIG 46 02/11/2023   HDL 83 02/11/2023   CHOLHDL 2.4 02/11/2023   VLDL 9 02/11/2023   LDLCALC 110 (H) 02/11/2023   LDLCALC 152 (H) 07/15/2015    Physical Findings: AIMS:  , ,  ,  ,    CIWA:    COWS:     Musculoskeletal: Strength & Muscle Tone: within normal limits Gait & Station: normal Patient leans: N/A  Psychiatric Specialty Exam:  Presentation  General Appearance:  Casual  Eye Contact: Fleeting  Speech: Slow  Speech Volume: Decreased  Handedness: Right   Mood and Affect  Mood: Anxious  Affect: Flat   Thought  Process  Thought Processes: Disorganized  Descriptions of Associations:Tangential  Orientation:Partial  Thought Content:Illogical; Rumination; Tangential  History of Schizophrenia/Schizoaffective disorder:Yes  Duration of Psychotic Symptoms:Greater than six months  Hallucinations:Hallucinations: Auditory  Ideas of Reference:-- (denies)  Suicidal Thoughts:Suicidal Thoughts: No  Homicidal Thoughts:Homicidal Thoughts: No   Sensorium  Memory: Immediate Poor; Remote Fair; Recent Poor  Judgment: Impaired  Insight: Lacking   Executive Functions  Concentration: Poor  Attention Span: Poor  Recall: Poor  Fund of Knowledge: Poor  Language: Fair   Psychomotor Activity  Psychomotor Activity: Psychomotor Activity: Normal   Assets  Assets: Resilience; Social Support   Sleep  Sleep: Sleep: Fair    Physical Exam: Physical Exam Vitals reviewed.  Constitutional:      General: He is not in acute distress.    Appearance: He is normal weight. He is not toxic-appearing.  Pulmonary:     Effort: Pulmonary effort is normal. No respiratory distress.  Neurological:     Mental Status: He is alert.     Motor: No weakness.     Gait: Gait normal.    Review of Systems  Constitutional:  Negative for chills and fever.  Cardiovascular:  Negative for chest pain and palpitations.  Neurological:  Negative for dizziness, tingling, tremors and headaches.  Psychiatric/Behavioral:  Positive for hallucinations and substance abuse. Negative for depression, memory loss and suicidal ideas. The patient is nervous/anxious. The patient does not have insomnia.   All other systems reviewed and are negative.  Blood pressure 128/79, pulse 99, temperature 98.5 F (36.9 C), temperature source Oral, resp. rate 18, height 5\' 8"  (1.727 m), weight 74 kg, SpO2 98%. Body mass index is 24.81 kg/m.   Treatment Plan Summary: Daily contact with patient to assess and evaluate symptoms and  progress  in treatment and Medication management   ASSESSMENT:  Diagnoses / Active Problems: Schizophrenia, paranoid type (HCC)   Cannabis use disorder, severe, dependence (HCC)   Borderline intellectual functioning   Cocaine use disorder (HCC)  PLAN: Safety and Monitoring:  --  Voluntary admission to inpatient psychiatric unit for safety, stabilization and treatment  -- Daily contact with patient to assess and evaluate symptoms and progress in treatment  -- Patient's case to be discussed in multi-disciplinary team meeting  -- Observation Level : q15 minute checks  -- Vital signs:  q12 hours  -- Precautions: suicide, elopement, and assault  2. Psychiatric Diagnoses and Treatment:    Change Haldol from tablet to liquid formulation, due to concern the patient is nonadherent or cheeking his medication.  The dose will be the same, 5 mg in the morning and 15 mg at bedtime for schizophrenia.  Continue Cogentin 0.5 mg twice daily for EPS prophylaxis  Continue vitamin D supplementation  Continue hydroxyzine as needed for anxiety and trazodone as needed and insomnia  --  The risks/benefits/side-effects/alternatives to this medication were discussed in detail with the patient and time was given for questions. The patient consents to medication trial.      3. Medical Issues Being Addressed:     4. Discharge Planning:   -- Social work and case management to assist with discharge planning and identification of hospital follow-up needs prior to discharge  -- Estimated LOS: 5-7 days  -- Discharge Concerns: Need to establish a safety plan; Medication compliance and effectiveness  -- Discharge Goals: Return home with outpatient referrals for mental health follow-up including medication management/psychotherapy   Phineas Inches, MD 02/19/2023, 10:45 AM  Total Time Spent in Direct Patient Care:  I personally spent 35 minutes on the unit in direct patient care. The direct patient care  time included face-to-face time with the patient, reviewing the patient's chart, communicating with other professionals, and coordinating care. Greater than 50% of this time was spent in counseling or coordinating care with the patient regarding goals of hospitalization, psycho-education, and discharge planning needs.   Phineas Inches, MD Psychiatrist

## 2023-02-19 NOTE — Group Note (Signed)
Date:  02/19/2023 Time:  8:45 PM  Group Topic/Focus:  Wrap-Up Group:   The focus of this group is to help patients review their daily goal of treatment and discuss progress on daily workbooks.    Participation Level:  Active  Participation Quality:  Appropriate  Affect:  Appropriate  Cognitive:  Appropriate  Insight: Appropriate  Engagement in Group:  Engaged  Modes of Intervention:  Discussion  Additional Comments:  Patient attended and participated in group tonight. He reports that he like that he does not lie to himself.  Lita Mains [facility] 02/19/2023, 8:45 PM

## 2023-02-20 DIAGNOSIS — F200 Paranoid schizophrenia: Secondary | ICD-10-CM | POA: Diagnosis not present

## 2023-02-20 MED ORDER — ARIPIPRAZOLE 10 MG PO TABS
10.0000 mg | ORAL_TABLET | Freq: Every day | ORAL | Status: DC
  Administered 2023-02-20 – 2023-02-24 (×5): 10 mg via ORAL
  Filled 2023-02-20 (×8): qty 1

## 2023-02-20 MED ORDER — BENZTROPINE MESYLATE 1 MG PO TABS
1.0000 mg | ORAL_TABLET | Freq: Two times a day (BID) | ORAL | Status: DC
  Administered 2023-02-20 – 2023-03-03 (×22): 1 mg via ORAL
  Filled 2023-02-20 (×5): qty 1
  Filled 2023-02-20 (×2): qty 14
  Filled 2023-02-20 (×22): qty 1

## 2023-02-20 NOTE — [facility] Group Notes (Signed)
Adult Psychoeducational Group Note  Date:  02/20/2023 Time:  9:52 AM  Group Topic/Focus:  Goals Group:   The focus of this group is to help patients establish daily goals to achieve during treatment and discuss how the patient can incorporate goal setting into their daily lives to aide in recovery. Orientation:   The focus of this group is to educate the patient on the purpose and policies of crisis stabilization and provide a format to answer questions about their admission.  The group details unit policies and expectations of patients while admitted.  Participation Level:  Did Not Attend  Participation Quality:    Affect:    Cognitive:    Insight:   Engagement in Group:    Modes of Intervention:    Additional Comments:    Sheran Lawless 02/20/2023, 9:52 AM

## 2023-02-20 NOTE — Progress Notes (Signed)
[facility] MD Progress Note  02/20/2023 10:15 AM [name] Love  MRN:  [id number]  Subjective:   Pt is a 27 yo African American male with prior mental health diagnoses of P. Schizophrenia, Borderline Intellectual functioning, Cannabis use disorder who was transferred & admitted voluntarily to this [facility] on 12/02 after initially presenting to the [facility] on 11/29 with psychosis; Presented with disorganized thoughts, and making illogical statements, accompanied by his family members. As per documentation from the [facility]: "Pt states that he gets killed every time he states what's wrong. Pt states that his body talks to him, such as his butt. Pt denies SI, HI, AH and alcohol use at this present time. Pt endorses VH, stating that he sees letters and smoking synthetic herbs. Pt states that we can [place] help him by providing him with liquids and blood work."    On interview today, the patient is still psychotic.  He states that he had back plan last night because he uses the covers when he sleeps.  He reports he has auditory hallucinations of a friend from school that is telling him to he needs to return to class, but not eat when he is class and then write on the white board.  He reports he needs a heart scan because he has hiccups.  He reports having visual hallucinations but would not describe these, and told the nurse this as well.  He otherwise reports that sleep is okay.  He reports that his mood is "perfect".  He does not appear depressed or euphoric.  He is still confused, and there is thought blocking.  There is a mild pill-rolling tremor of his left upper extremity, that is present at rest.  He has no other EPS including rigidity or cogwheeling.  He denies having other side effects from current psychiatric medications.  He is agreeable with restarting Abilify, which she had taken in the past with apparent good effect, and continuing the Haldol.  He otherwise denies having any SI or HI.      Principal Problem:  Schizophrenia, paranoid type (HCC) Diagnosis: Principal Problem:   Schizophrenia, paranoid type (HCC) Active Problems:   Cannabis use disorder, severe, dependence (HCC)   Borderline intellectual functioning   Cocaine use disorder (HCC)  Total Time spent with patient: 20 minutes  Past Psychiatric History:  Previous Psych Diagnoses: P. Schizophrenia  Prior inpatient treatment: Multiple times at this hospital on the C/A unit: 10/26/2023, 11/25/2014, 01/30/2025, 08/07/2015, 08/17/2015, 09/28/2015, and on the adult unit at this hospital on 03/23/2016, 03/22/2017. Current/prior outpatient treatment: Vesta Mixer, [facility].  Prior rehab hx: Denies  Psychotherapy hx: [facility] History of suicide attempt: Denies  History of homicide or aggression: As per collateral information obtained by the [facility], pt has aggressive behaviors at home  Psychiatric medication history: Abilify LAI in the past, Ambien, Depakote, Cogentin, Haldol, Ambien in the past as per chart review. Psychiatric medication compliance history: non compliant as per chart review Neuromodulation history: Denies  Current Psychiatrist:[facility] Current therapist: [facility] Mixer    Past Medical History:  Past Medical History:  Diagnosis Date   Schizophrenia, acute (HCC)    History reviewed. No pertinent surgical history. Family History:  Family History  Problem Relation Age of Onset   Mental illness Cousin    Family Psychiatric  History: See H&P  Social History:  Social History   Substance and Sexual Activity  Alcohol Use Yes   Comment: a little      Social History   Substance  and Sexual Activity  Drug Use No   Comment: denies    Social History   Socioeconomic History   Marital status: Single    Spouse name: Not on file   Number of children: Not on file   Years of education: Not on file   Highest education level: Not on file  Occupational History   Not on file  Tobacco Use   Smoking status: Every Day     Current packs/day: 0.00    Types: Cigarettes   Smokeless tobacco: Never  Substance and Sexual Activity   Alcohol use: Yes    Comment: a little    Drug use: No    Comment: denies   Sexual activity: Not Currently  Other Topics Concern   Not on file  Social History Narrative   Not on file   Social Determinants of Health   Financial Resource Strain: Not on file  Food Insecurity: Food Insecurity Present (02/14/2023)   Hunger Vital Sign    Worried About Running Out of Food in the Last Year: Sometimes true    Ran Out of Food in the Last Year: Sometimes true  Transportation Needs: Unmet Transportation Needs (02/14/2023)   PRAPARE - Administrator, Civil Service (Medical): Yes    Lack of Transportation (Non-Medical): Yes  Physical Activity: Not on file  Stress: Not on file  Social Connections: Not on file   Additional Social History:                           Current Medications: Current Facility-Administered Medications  Medication Dose Route Frequency Provider Last Rate Last Admin   acetaminophen (TYLENOL) tablet 650 mg  650 mg Oral Q6H PRN Tomie China, MD   650 mg at 02/20/23 1610   alum & mag hydroxide-simeth (MAALOX/MYLANTA) 200-200-20 MG/5ML suspension 30 mL  30 mL Oral Q4H PRN Tomie China, MD       ARIPiprazole (ABILIFY) tablet 10 mg  10 mg Oral Daily Kaylena Pacifico, Harrold Donath, MD       benztropine (COGENTIN) tablet 1 mg  1 mg Oral BID Alizee Maple, MD       haloperidol (HALDOL) tablet 5 mg  5 mg Oral TID PRN Sarena Jezek, Harrold Donath, MD       And   diphenhydrAMINE (BENADRYL) capsule 50 mg  50 mg Oral TID PRN Mycal Conde, Harrold Donath, MD       haloperidol lactate (HALDOL) injection 5 mg  5 mg Intramuscular TID PRN Ezariah Nace, Harrold Donath, MD       And   diphenhydrAMINE (BENADRYL) injection 50 mg  50 mg Intramuscular TID PRN Yardley Beltran, Harrold Donath, MD       And   LORazepam (ATIVAN) injection 2 mg  2 mg Intramuscular TID PRN Gaye Scorza, Harrold Donath, MD        haloperidol lactate (HALDOL) injection 10 mg  10 mg Intramuscular TID PRN Cathleen Yagi, Harrold Donath, MD       And   diphenhydrAMINE (BENADRYL) injection 50 mg  50 mg Intramuscular TID PRN Tiegan Jambor, Harrold Donath, MD       And   LORazepam (ATIVAN) injection 2 mg  2 mg Intramuscular TID PRN Dewitte Vannice, Harrold Donath, MD       divalproex (DEPAKOTE ER) 24 hr tablet 750 mg  750 mg Oral QHS Yousof Alderman, Harrold Donath, MD   750 mg at 02/19/23 2057   haloperidol (HALDOL) 2 MG/ML solution 15 mg  15 mg Oral QHS Phineas Inches, MD   15 mg at 02/19/23 2058  haloperidol (HALDOL) 2 MG/ML solution 5 mg  5 mg Oral Q0600 Odalys Win, MD       hydrOXYzine (ATARAX) tablet 25 mg  25 mg Oral TID PRN Tomie China, MD   25 mg at 02/19/23 2058   magnesium hydroxide (MILK OF MAGNESIA) suspension 30 mL  30 mL Oral Daily PRN Tomie China, MD       traZODone (DESYREL) tablet 50 mg  50 mg Oral QHS PRN Tomie China, MD   50 mg at 02/19/23 2058   Vitamin D (Ergocalciferol) (DRISDOL) 1.25 MG (50000 UNIT) capsule 50,000 Units  50,000 Units Oral Q7 days Starleen Blue, NP   50,000 Units at 02/16/23 2126   white petrolatum (VASELINE) gel   Topical PRN Starleen Blue, NP        Lab Results:  Results for orders placed or performed during the hospital encounter of 02/14/23 (from the past 48 hour(s))  Valproic acid level     Status: Abnormal   Collection Time: 02/19/23  6:43 PM  Result Value Ref Range   Valproic Acid Lvl 39 (L) 50.0 - 100.0 ug/mL    Comment: Performed at [facility], [address] [place], [place] [address]    Blood Alcohol level:  Lab Results  Component Value Date   [facility] <10 02/11/2023   ETH <10 02/06/2023    Metabolic Disorder Labs: Lab Results  Component Value Date   HGBA1C 5.3 02/11/2023   MPG 105.41 02/11/2023   MPG 117 11/27/2014   Lab Results  Component Value Date   PROLACTIN 57.5 (H) 08/20/2015   PROLACTIN 23.2 (H) 02/04/2015   Lab Results  Component Value Date    CHOL 202 (H) 02/11/2023   TRIG 46 02/11/2023   HDL 83 02/11/2023   CHOLHDL 2.4 02/11/2023   VLDL 9 02/11/2023   LDLCALC 110 (H) 02/11/2023   LDLCALC 152 (H) 07/15/2015    Physical Findings: AIMS:  , ,  ,  ,    CIWA:    COWS:     + pill-rolling tremor left upper extremity at rest No other appreciable tremor, rigidity, or cogwheeling appreciated on exam on 12/8  Musculoskeletal: Strength & Muscle Tone: within normal limits Gait & Station: normal Patient leans: N/A  Psychiatric Specialty Exam:  Presentation  General Appearance:  Casual  Eye Contact: Fleeting  Speech: Slow  Speech Volume: Decreased  Handedness: Right   Mood and Affect  Mood: Anxious  Affect: Flat   Thought Process  Thought Processes: Disorganized  Descriptions of Associations:Tangential  Orientation:Partial  Thought Content:Illogical; Rumination; Tangential  History of Schizophrenia/Schizoaffective disorder:Yes  Duration of Psychotic Symptoms:Greater than six months  Hallucinations:Hallucinations: Auditory  Ideas of Reference:-- (denies)  Suicidal Thoughts:Suicidal Thoughts: No  Homicidal Thoughts:Homicidal Thoughts: No   Sensorium  Memory: Immediate Poor; Remote Fair; Recent Poor  Judgment: Impaired  Insight: Lacking   Executive Functions  Concentration: Poor  Attention Span: Poor  Recall: Poor  Fund of Knowledge: Poor  Language: Fair   Psychomotor Activity  Psychomotor Activity: Psychomotor Activity: Normal   Assets  Assets: Resilience; Social Support   Sleep  Sleep: Sleep: Fair    Physical Exam: Physical Exam Vitals reviewed.  Constitutional:      General: He is not in acute distress.    Appearance: He is normal weight. He is not toxic-appearing.  Pulmonary:     Effort: Pulmonary effort is normal. No respiratory distress.  Neurological:     Mental Status: He is alert.  Motor: No weakness.     Gait: Gait normal.     Review of Systems  Constitutional:  Negative for chills and fever.  Cardiovascular:  Negative for chest pain and palpitations.  Neurological:  Negative for dizziness, tingling, tremors and headaches.  Psychiatric/Behavioral:  Positive for hallucinations and substance abuse. Negative for depression, memory loss and suicidal ideas. The patient is nervous/anxious. The patient does not have insomnia.   All other systems reviewed and are negative.  Blood pressure (!) 126/54, pulse (!) 113, temperature 98.3 F (36.8 C), temperature source Oral, resp. rate 18, height 5\' 8"  (1.727 m), weight 74 kg, SpO2 100%. Body mass index is 24.81 kg/m.   Treatment Plan Summary: Daily contact with patient to assess and evaluate symptoms and progress in treatment and Medication management   ASSESSMENT:  Diagnoses / Active Problems: Schizophrenia, paranoid type (HCC)   Cannabis use disorder, severe, dependence (HCC)   Borderline intellectual functioning   Cocaine use disorder (HCC)  PLAN: Safety and Monitoring:  --  Voluntary admission to inpatient psychiatric unit for safety, stabilization and treatment  -- Daily contact with patient to assess and evaluate symptoms and progress in treatment  -- Patient's case to be discussed in multi-disciplinary team meeting  -- Observation Level : q15 minute checks  -- Vital signs:  q12 hours  -- Precautions: suicide, elopement, and assault  2. Psychiatric Diagnoses and Treatment:    Continue liquid Haldol 5 mg in the morning and 15 mg at bedtime for schizophrenia   Increase Cogentin from0.5 mg twice daily to 1 mg twice daily for pill-rolling tremor.    Start Abilify 10 mg once daily for psychosis that has not responded to Haldol.  Patient reports that he has previous responded well to both Haldol and Abilify in the past.  Continue vitamin D supplementation  Continue hydroxyzine as needed for anxiety and trazodone as needed and insomnia  --  The  risks/benefits/side-effects/alternatives to this medication were discussed in detail with the patient and time was given for questions. The patient consents to medication trial.      3. Medical Issues Being Addressed:     4. Discharge Planning:   -- Social work and case management to assist with discharge planning and identification of hospital follow-up needs prior to discharge  -- Estimated LOS: 5-7 more days  -- Discharge Concerns: Need to establish a safety plan; Medication compliance and effectiveness  -- Discharge Goals: Return home with outpatient referrals for mental health follow-up including medication management/psychotherapy   Phineas Inches, MD 02/20/2023, 10:15 AM  Total Time Spent in Direct Patient Care:  I personally spent 35 minutes on the unit in direct patient care. The direct patient care time included face-to-face time with the patient, reviewing the patient's chart, communicating with other professionals, and coordinating care. Greater than 50% of this time was spent in counseling or coordinating care with the patient regarding goals of hospitalization, psycho-education, and discharge planning needs.   Phineas Inches, MD Psychiatrist

## 2023-02-20 NOTE — Plan of Care (Signed)
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
## 2023-02-20 NOTE — Progress Notes (Signed)
   02/20/23 0552  15 Minute Checks  Location Bedroom  Visual Appearance Calm  Behavior Sleeping  Sleep (Behavioral Health Patients Only)  Calculate sleep? (Click Yes once per 24 hr at 0600 safety check) Yes  Documented sleep last 24 hours 8.5

## 2023-02-21 ENCOUNTER — Encounter (HOSPITAL_COMMUNITY): Payer: Self-pay

## 2023-02-21 DIAGNOSIS — F200 Paranoid schizophrenia: Secondary | ICD-10-CM | POA: Diagnosis not present

## 2023-02-21 NOTE — BH IP Treatment Plan (Signed)
Interdisciplinary Treatment and Diagnostic Plan Update  02/21/2023 Time of Session: 1:30 pm- Update [name] Love MRN: [id number]  Principal Diagnosis: Schizophrenia, paranoid type (HCC)  Secondary Diagnoses: Principal Problem:   Schizophrenia, paranoid type (HCC) Active Problems:   Cannabis use disorder, severe, dependence (HCC)   Borderline intellectual functioning   Cocaine use disorder (HCC)   Current Medications:  Current Facility-Administered Medications  Medication Dose Route Frequency Provider Last Rate Last Admin   acetaminophen (TYLENOL) tablet 650 mg  650 mg Oral Q6H PRN Tomie China, MD   650 mg at 02/20/23 2841   alum & mag hydroxide-simeth (MAALOX/MYLANTA) 200-200-20 MG/5ML suspension 30 mL  30 mL Oral Q4H PRN Tomie China, MD       ARIPiprazole (ABILIFY) tablet 10 mg  10 mg Oral Daily Massengill, Harrold Donath, MD   10 mg at 02/21/23 0844   benztropine (COGENTIN) tablet 1 mg  1 mg Oral BID Massengill, Harrold Donath, MD   1 mg at 02/21/23 0844   haloperidol (HALDOL) tablet 5 mg  5 mg Oral TID PRN Phineas Inches, MD       And   diphenhydrAMINE (BENADRYL) capsule 50 mg  50 mg Oral TID PRN Massengill, Harrold Donath, MD       haloperidol lactate (HALDOL) injection 5 mg  5 mg Intramuscular TID PRN Massengill, Harrold Donath, MD       And   diphenhydrAMINE (BENADRYL) injection 50 mg  50 mg Intramuscular TID PRN Massengill, Harrold Donath, MD       And   LORazepam (ATIVAN) injection 2 mg  2 mg Intramuscular TID PRN Massengill, Harrold Donath, MD       haloperidol lactate (HALDOL) injection 10 mg  10 mg Intramuscular TID PRN Massengill, Harrold Donath, MD       And   diphenhydrAMINE (BENADRYL) injection 50 mg  50 mg Intramuscular TID PRN Massengill, Harrold Donath, MD       And   LORazepam (ATIVAN) injection 2 mg  2 mg Intramuscular TID PRN Massengill, Harrold Donath, MD       divalproex (DEPAKOTE ER) 24 hr tablet 750 mg  750 mg Oral QHS Massengill, Harrold Donath, MD   750 mg at 02/20/23 2042   haloperidol (HALDOL) 2 MG/ML solution 15  mg  15 mg Oral QHS Massengill, Harrold Donath, MD   15 mg at 02/20/23 2043   haloperidol (HALDOL) 2 MG/ML solution 5 mg  5 mg Oral Q0600 Massengill, Harrold Donath, MD   5 mg at 02/21/23 0844   hydrOXYzine (ATARAX) tablet 25 mg  25 mg Oral TID PRN Tomie China, MD   25 mg at 02/20/23 2044   magnesium hydroxide (MILK OF MAGNESIA) suspension 30 mL  30 mL Oral Daily PRN Tomie China, MD       traZODone (DESYREL) tablet 50 mg  50 mg Oral QHS PRN Tomie China, MD   50 mg at 02/20/23 2043   Vitamin D (Ergocalciferol) (DRISDOL) 1.25 MG (50000 UNIT) capsule 50,000 Units  50,000 Units Oral Q7 days Starleen Blue, NP   50,000 Units at 02/16/23 2126   white petrolatum (VASELINE) gel   Topical PRN Starleen Blue, NP       PTA Medications: Medications Prior to Admission  Medication Sig Dispense Refill Last Dose   divalproex (DEPAKOTE) 500 MG DR tablet Take 1 tablet (500 mg total) by mouth at bedtime.       Patient Stressors:    Patient Strengths: Supportive family/friends   Treatment Modalities: Medication Management, Group therapy, Case management,  1 to 1 session with clinician, Psychoeducation, Recreational  therapy.   Physician Treatment Plan for Primary Diagnosis: Schizophrenia, paranoid type (HCC) Long Term Goal(s): Improvement in symptoms so as ready for discharge   Short Term Goals: Ability to identify changes in lifestyle to reduce recurrence of condition will improve Ability to verbalize feelings will improve Ability to disclose and discuss suicidal ideas Ability to demonstrate self-control will improve Ability to identify and develop effective coping behaviors will improve Ability to maintain clinical measurements within normal limits will improve Compliance with prescribed medications will improve Ability to identify triggers associated with substance abuse/mental health issues will improve  Medication Management: Evaluate patient's response, side effects, and tolerance of  medication regimen.  Therapeutic Interventions: 1 to 1 sessions, Unit Group sessions and Medication administration.  Evaluation of Outcomes: Not Progressing  Physician Treatment Plan for Secondary Diagnosis: Principal Problem:   Schizophrenia, paranoid type (HCC) Active Problems:   Cannabis use disorder, severe, dependence (HCC)   Borderline intellectual functioning   Cocaine use disorder (HCC)  Long Term Goal(s): Improvement in symptoms so as ready for discharge   Short Term Goals: Ability to identify changes in lifestyle to reduce recurrence of condition will improve Ability to verbalize feelings will improve Ability to disclose and discuss suicidal ideas Ability to demonstrate self-control will improve Ability to identify and develop effective coping behaviors will improve Ability to maintain clinical measurements within normal limits will improve Compliance with prescribed medications will improve Ability to identify triggers associated with substance abuse/mental health issues will improve     Medication Management: Evaluate patient's response, side effects, and tolerance of medication regimen.  Therapeutic Interventions: 1 to 1 sessions, Unit Group sessions and Medication administration.  Evaluation of Outcomes: Not Progressing   RN Treatment Plan for Primary Diagnosis: Schizophrenia, paranoid type (HCC) Long Term Goal(s): Knowledge of disease and therapeutic regimen to maintain health will improve  Short Term Goals: Ability to remain free from injury will improve, Ability to demonstrate self-control, Ability to verbalize feelings will improve, Ability to identify and develop effective coping behaviors will improve, and Compliance with prescribed medications will improve  Medication Management: RN will administer medications as ordered by provider, will assess and evaluate patient's response and provide education to patient for prescribed medication. RN will report any  adverse and/or side effects to prescribing provider.  Therapeutic Interventions: 1 on 1 counseling sessions, Psychoeducation, Medication administration, Evaluate responses to treatment, Monitor vital signs and CBGs as ordered, Perform/monitor CIWA, COWS, AIMS and Fall Risk screenings as ordered, Perform wound care treatments as ordered.  Evaluation of Outcomes: Not Progressing   LCSW Treatment Plan for Primary Diagnosis: Schizophrenia, paranoid type (HCC) Long Term Goal(s): Safe transition to appropriate next level of care at discharge, Engage patient in therapeutic group addressing interpersonal concerns.  Short Term Goals: Engage patient in aftercare planning with referrals and resources, Increase social support, Increase ability to appropriately verbalize feelings, and Increase skills for wellness and recovery  Therapeutic Interventions: Assess for all discharge needs, 1 to 1 time with Social worker, Explore available resources and support systems, Assess for adequacy in community support network, Educate family and significant other(s) on suicide prevention, Complete Psychosocial Assessment, Interpersonal group therapy.  Evaluation of Outcomes: Not Progressing   Progress in Treatment: Attending groups: Yes. Participating in groups: Yes. Taking medication as prescribed: Yes. Toleration medication: Yes. Family/Significant other contact made: Yes, individual(s) contacted:  [name], mother [phone] Patient understands diagnosis: Yes. Discussing patient identified problems/goals with staff: Yes. Medical problems stabilized or resolved: Yes. Denies suicidal/homicidal ideation: Yes. Issues/concerns  per patient self-inventory: No. Other: na   New problem(s) identified: No, Describe:  none reported   New Short Term/Long Term Goal(s): medication stabilization, elimination of SI thoughts, development of comprehensive mental wellness plan.      Patient Goals:  " I would like to  stay here for a long time and improve my behavior- I need a safe a place for a long time"   Discharge Plan or Barriers:  Patient recently admitted. CSW will continue to follow and assess for appropriate referrals and possible discharge planning.      Reason for Continuation of Hospitalization: Aggression Delusions  Hallucinations Suicidal ideation   Estimated Length of Stay: 5-7 days Last 3 [place] Suicide Severity Risk Score: Flowsheet Row [facility] (Current[facility] from [facility]20[facility] in [facility] from 02/11/2023 in [facility]  C-SSRS RISK CATEGORY No Risk No Risk No Risk       Last [facility] 2/9 Scores:     No data to display          Scribe for Treatment Team: Uwe, Kois 02/21/2023 7:31 PM

## 2023-02-21 NOTE — Group Note (Signed)
Date:  02/21/2023 Time:  8:53 PM  Group Topic/Focus:  Wrap-Up Group:   The focus of this group is to help patients review their daily goal of treatment and discuss progress on daily workbooks.    Participation Level:  Active  Participation Quality:  Appropriate  Affect:  Appropriate  Cognitive:  Appropriate  Insight: Appropriate  Engagement in Group:  Developing/Improving  Modes of Intervention:  Discussion  Additional Comments:  Pt stated his goal for today was to focus on his treatment plan. Pt stated he accomplished his goal today. Pt stated he talked with his doctor and social worker about his care today. Pt rated his overall day a 10. Pt stated he made no calls today. Pt stated he felt better about himself today. Pt stated he was able to attend all meals. Pt stated he took all medications provided today. Pt stated he attend all groups held today. Pt stated his appetite was pretty good today. Pt rated sleep last night was pretty good. Pt stated the goal tonight was to get some rest. Pt stated he had no physical pain tonight. Pt deny visual hallucinations and auditory issues tonight. Pt denies thoughts of harming himself or others. Pt stated he would alert staff if anything changed.  Felipa Furnace 02/21/2023, 8:53 PM

## 2023-02-21 NOTE — Plan of Care (Signed)
  Problem: Education: Goal: Emotional status will improve Outcome: Progressing   

## 2023-02-21 NOTE — Group Note (Unsigned)
LCSW Group Therapy Note   Group Date: 02/21/2023 Start Time: 1300 End Time: 1400  Type of Therapy and Topic:  Group Therapy:  Feelings About Hospitalization  Participation Level:  DID NOT ATTEND  Description of Group This process group involved patients discussing their feelings related to being hospitalized, as well as the benefits they see to being in the hospital.  These feelings and benefits were itemized.  The group then brainstormed specific ways in which they could seek those same benefits when they discharge and return home.  Kathrynn Humble 02/22/2023  9:39 AM

## 2023-02-21 NOTE — Progress Notes (Signed)
[facility] MD Progress Note  02/21/2023 4:15 PM [name] Love  MRN:  [id number]  Subjective:   Pt is a 27 yo African American male with prior mental health diagnoses of P. Schizophrenia, Borderline Intellectual functioning, Cannabis use disorder who was transferred & admitted voluntarily to this [facility] on 12/02 after initially presenting to the [facility] on 11/29 with psychosis; Presented with disorganized thoughts, and making illogical statements, accompanied by his family members. As per documentation from the [facility]: "Pt states that he gets killed every time he states what's wrong. Pt states that his body talks to him, such as his butt. Pt denies SI, HI, AH and alcohol use at this present time. Pt endorses VH, stating that he sees letters and smoking synthetic herbs. Pt states that we can [place] help him by providing him with liquids and blood work."    24-hour chart review: Vitals within normal limits, patient remains compliant with scheduled medications, no behavioral episodes in the past 24 hours, as needed medications overnight where trazodone, and hydroxyzine.  Patient also required Tylenol for pain.  As per CSW note today, patient has been accepted with the [facility], and they will be to the unit to see him tomorrow.  Patient has attended some group sessions in the past 24 hours.  Patient assessment: On interview today, the patient denies psychosis, but remains non linear in his responses to questions: Patient is asked if he is still hearing voices and he responds "I use the bathroom", he is asked if he is still feeling suicidal and he responds "no, I am healthy".  He is asked if he is seeing things that other people cannot see, and responds: "No, I do not speak Spanish".  He is asked if he is having side effects to his medications and states: "No just liquid".  He is asked if he is in any physical pain, and he says "I feel good right now".  He reports that appetite is good, reports that sleep  last night was good.  Attempts to reach patient's mother today to get collateral information regarding baseline of functioning failed as she would not answer the phone, but writer will make attempts again to reach her tomorrow to obtain this information.  As seen in the above statements made by patient, today, he denies SI/HI/AVH.  We will continue medications at this time as listed below, with no changes today.  Patient is continuing to present with disorganized thoughts, he is illogical, answers questions in a nonlinear manner, continues to require inpatient hospitalization at this time, while we obtain more information from family to determine level of functioning.  Discharge safety planning will be revisited on a daily basis.  Principal Problem: Schizophrenia, paranoid type (HCC) Diagnosis: Principal Problem:   Schizophrenia, paranoid type (HCC) Active Problems:   Cannabis use disorder, severe, dependence (HCC)   Borderline intellectual functioning   Cocaine use disorder (HCC)  Total Time spent with patient: 20 minutes  Past Psychiatric History:  Previous Psych Diagnoses: P. Schizophrenia  Prior inpatient treatment: Multiple times at this hospital on the C/A unit: 10/26/2023, 11/25/2014, 01/30/2025, 08/07/2015, 08/17/2015, 09/28/2015, and on the adult unit at this hospital on 03/23/2016, 03/22/2017. Current/prior outpatient treatment: [facility] Mixer, [facility].  Prior rehab hx: Denies  Psychotherapy hx: [facility] History of suicide attempt: Denies  History of homicide or aggression: As per collateral information obtained by the [facility], pt has aggressive behaviors at home  Psychiatric medication history: Abilify LAI in the  past, Ambien, Depakote, Cogentin, Haldol, Ambien in the past as per chart review. Psychiatric medication compliance history: non compliant as per chart review Neuromodulation history: Denies  Current Psychiatrist:[facility] Current therapist: [facility]er    Past  Medical History:  Past Medical History:  Diagnosis Date   Schizophrenia, acute (HCC)    History reviewed. No pertinent surgical history. Family History:  Family History  Problem Relation Age of Onset   Mental illness Cousin    Family Psychiatric  History: See H&P  Social History:  Social History   Substance and Sexual Activity  Alcohol Use Yes   Comment: a little      Social History   Substance and Sexual Activity  Drug Use No   Comment: denies    Social History   Socioeconomic History   Marital status: Single    Spouse name: Not on file   Number of children: Not on file   Years of education: Not on file   Highest education level: Not on file  Occupational History   Not on file  Tobacco Use   Smoking status: Every Day    Current packs/day: 0.00    Types: Cigarettes   Smokeless tobacco: Never  Substance and Sexual Activity   Alcohol use: Yes    Comment: a little    Drug use: No    Comment: denies   Sexual activity: Not Currently  Other Topics Concern   Not on file  Social History Narrative   Not on file   Social Determinants of Health   Financial Resource Strain: Not on file  Food Insecurity: Food Insecurity Present (02/14/2023)   Hunger Vital Sign    Worried About Running Out of Food in the Last Year: Sometimes true    Ran Out of Food in the Last Year: Sometimes true  Transportation Needs: Unmet Transportation Needs (02/14/2023)   PRAPARE - Administrator, Civil Service (Medical): Yes    Lack of Transportation (Non-Medical): Yes  Physical Activity: Not on file  Stress: Not on file  Social Connections: Not on file  Current Medications: Current Facility-Administered Medications  Medication Dose Route Frequency Provider Last Rate Last Admin   acetaminophen (TYLENOL) tablet 650 mg  650 mg Oral Q6H PRN Tomie China, MD   650 mg at 02/20/23 4010   alum & mag hydroxide-simeth (MAALOX/MYLANTA) 200-200-20 MG/5ML suspension 30 mL  30 mL Oral  Q4H PRN Tomie China, MD       ARIPiprazole (ABILIFY) tablet 10 mg  10 mg Oral Daily Massengill, Harrold Donath, MD   10 mg at 02/21/23 0844   benztropine (COGENTIN) tablet 1 mg  1 mg Oral BID Massengill, Harrold Donath, MD   1 mg at 02/21/23 0844   haloperidol (HALDOL) tablet 5 mg  5 mg Oral TID PRN Phineas Inches, MD       And   diphenhydrAMINE (BENADRYL) capsule 50 mg  50 mg Oral TID PRN Massengill, Harrold Donath, MD       haloperidol lactate (HALDOL) injection 5 mg  5 mg Intramuscular TID PRN Massengill, Harrold Donath, MD       And   diphenhydrAMINE (BENADRYL) injection 50 mg  50 mg Intramuscular TID PRN Massengill, Harrold Donath, MD       And   LORazepam (ATIVAN) injection 2 mg  2 mg Intramuscular TID PRN Massengill, Harrold Donath, MD       haloperidol lactate (HALDOL) injection 10 mg  10 mg Intramuscular TID PRN Phineas Inches, MD       And  diphenhydrAMINE (BENADRYL) injection 50 mg  50 mg Intramuscular TID PRN Massengill, Harrold Donath, MD       And   LORazepam (ATIVAN) injection 2 mg  2 mg Intramuscular TID PRN Massengill, Harrold Donath, MD       divalproex (DEPAKOTE ER) 24 hr tablet 750 mg  750 mg Oral QHS Massengill, Harrold Donath, MD   750 mg at 02/20/23 2042   haloperidol (HALDOL) 2 MG/ML solution 15 mg  15 mg Oral QHS Massengill, Harrold Donath, MD   15 mg at 02/20/23 2043   haloperidol (HALDOL) 2 MG/ML solution 5 mg  5 mg Oral Q0600 Massengill, Harrold Donath, MD   5 mg at 02/21/23 0844   hydrOXYzine (ATARAX) tablet 25 mg  25 mg Oral TID PRN Tomie China, MD   25 mg at 02/20/23 2044   magnesium hydroxide (MILK OF MAGNESIA) suspension 30 mL  30 mL Oral Daily PRN Tomie China, MD       traZODone (DESYREL) tablet 50 mg  50 mg Oral QHS PRN Tomie China, MD   50 mg at 02/20/23 2043   Vitamin D (Ergocalciferol) (DRISDOL) 1.25 MG (50000 UNIT) capsule 50,000 Units  50,000 Units Oral Q7 days Starleen Blue, NP   50,000 Units at 02/16/23 2126   white petrolatum (VASELINE) gel   Topical PRN Starleen Blue, NP        Lab Results:   Results for orders placed or performed during the hospital encounter of 02/14/23 (from the past 48 hour(s))  Valproic acid level     Status: Abnormal   Collection Time: 02/19/23  6:43 PM  Result Value Ref Range   Valproic Acid Lvl 39 (L) 50.0 - 100.0 ug/mL    Comment: Performed at [facility], [address], [place], [place] [address]    Blood Alcohol level:  Lab Results  Component Value Date   [facility] <10 02/11/2023   ETH <10 02/06/2023    Metabolic Disorder Labs: Lab Results  Component Value Date   HGBA1C 5.3 02/11/2023   MPG 105.41 02/11/2023   MPG 117 11/27/2014   Lab Results  Component Value Date   PROLACTIN 57.5 (H) 08/20/2015   PROLACTIN 23.2 (H) 02/04/2015   Lab Results  Component Value Date   CHOL 202 (H) 02/11/2023   TRIG 46 02/11/2023   HDL 83 02/11/2023   CHOLHDL 2.4 02/11/2023   VLDL 9 02/11/2023   LDLCALC 110 (H) 02/11/2023   LDLCALC 152 (H) 07/15/2015    Physical Findings: AIMS:  , ,  ,  ,    CIWA:    COWS:     + pill-rolling tremor left upper extremity at rest No other appreciable tremor, rigidity, or cogwheeling appreciated on exam on 12/8  Musculoskeletal: Strength & Muscle Tone: within normal limits Gait & Station: normal Patient leans: N/A  Psychiatric Specialty Exam:  Presentation  General Appearance:  Casual  Eye Contact: Fair  Speech: Clear and Coherent  Speech Volume: Normal  Handedness: Right   Mood and Affect  Mood: Depressed  Affect: Congruent   Thought Process  Thought Processes: Disorganized  Descriptions of Associations:Circumstantial  Orientation:Partial  Thought Content:Illogical  History of Schizophrenia/Schizoaffective disorder:Yes  Duration of Psychotic Symptoms:Greater than six months  Hallucinations:No data recorded  Ideas of Reference:Paranoia  Suicidal Thoughts:Suicidal Thoughts: No   Homicidal Thoughts:Homicidal Thoughts: No    Sensorium  Memory: Recent  Poor  Judgment: Poor  Insight: Poor   Executive Functions  Concentration: Poor  Attention Span: Fair  Recall: Poor  Fund of  Knowledge: Poor  Language: Poor   Psychomotor Activity  Psychomotor Activity: Psychomotor Activity: Normal    Assets  Assets: Resilience   Sleep  Sleep: Sleep: Good     Physical Exam: Physical Exam Vitals reviewed.  Constitutional:      General: He is not in acute distress.    Appearance: He is normal weight. He is not toxic-appearing.  Pulmonary:     Effort: Pulmonary effort is normal. No respiratory distress.  Neurological:     Mental Status: He is alert.     Motor: No weakness.     Gait: Gait normal.    Review of Systems  Constitutional:  Negative for chills and fever.  Cardiovascular:  Negative for chest pain and palpitations.  Neurological:  Negative for dizziness, tingling, tremors and headaches.  Psychiatric/Behavioral:  Positive for hallucinations and substance abuse. Negative for depression, memory loss and suicidal ideas. The patient is nervous/anxious. The patient does not have insomnia.   All other systems reviewed and are negative.  Blood pressure 132/82, pulse 90, temperature 98.1 F (36.7 C), temperature source Oral, resp. rate 18, height 5\' 8"  (1.727 m), weight 74 kg, SpO2 100%. Body mass index is 24.81 kg/m.   Treatment Plan Summary: Daily contact with patient to assess and evaluate symptoms and progress in treatment and Medication management   ASSESSMENT:  Diagnoses / Active Problems: Schizophrenia, paranoid type (HCC)   Cannabis use disorder, severe, dependence (HCC)   Borderline intellectual functioning   Cocaine use disorder (HCC)  PLAN: Safety and Monitoring:  --  Voluntary admission to inpatient psychiatric unit for safety, stabilization and treatment  -- Daily contact with patient to assess and evaluate symptoms and progress in treatment  -- Patient's case to be discussed in  multi-disciplinary team meeting  -- Observation Level : q15 minute checks  -- Vital signs:  q12 hours  -- Precautions: suicide, elopement, and assault  2. Psychiatric Diagnoses and Treatment:    Continue liquid Haldol 5 mg in the morning and 15 mg at bedtime for schizophrenia   Continue Cogentin 1 mg twice for pill-rolling tremor.    Continue Abilify 10 mg once daily for psychosis that has not responded to Haldol.  Patient reports that he has previous responded well to both Haldol and Abilify in the past.  Continue vitamin D supplementation  Continue hydroxyzine as needed for anxiety and trazodone as needed and insomnia  --  The risks/benefits/side-effects/alternatives to this medication were discussed in detail with the patient and time was given for questions. The patient consents to medication trial.      3. Medical Issues Being Addressed:     4. Discharge Planning:   -- Social work and case management to assist with discharge planning and identification of hospital follow-up needs prior to discharge  -- Estimated LOS: 5-7 more days  -- Discharge Concerns: Need to establish a safety plan; Medication compliance and effectiveness  -- Discharge Goals: Return home with outpatient referrals for mental health follow-up including medication management/psychotherapy   Starleen Blue, NP 02/21/2023, 4:15 PM  Total Time Spent in Direct Patient Care:  I personally spent 35 minutes on the unit in direct patient care. The direct patient care time included face-to-face time with the patient, reviewing the patient's chart, communicating with other professionals, and coordinating care. Greater than 50% of this time was spent in counseling or coordinating care with the patient regarding goals of hospitalization, psycho-education, and discharge planning needs.   Patient ID: [name] Love, male  DOB: Jul 22, 1995, 27 y.o.   MRN: [id number]

## 2023-02-21 NOTE — Plan of Care (Signed)
  Problem: Education: Goal: Emotional status will improve Outcome: Progressing Goal: Verbalization of understanding the information provided will improve Outcome: Progressing   Problem: Activity: Goal: Interest or engagement in activities will improve Outcome: Progressing Goal: Sleeping patterns will improve Outcome: Progressing   Problem: Safety: Goal: Periods of time without injury will increase Outcome: Progressing   Problem: Education: Goal: Mental status will improve Outcome: Not Progressing

## 2023-02-21 NOTE — Progress Notes (Signed)
Patient is med compliant and remains mostly to self. Appears preoccupied but pleasant on unit.    02/21/23 0844  Psych Admission Type (Psych Patients Only)  Admission Status Voluntary  Psychosocial Assessment  Patient Complaints None  Eye Contact Glaring  Facial Expression Flat  Affect Preoccupied  Speech Tangential  Interaction Guarded;Minimal  Motor Activity Slow  Appearance/Hygiene Unremarkable  Behavior Characteristics Cooperative;Guarded  Mood Suspicious  Thought Process  Coherency Disorganized  Content Preoccupation  Delusions Paranoid  Perception Hallucinations  Hallucination Auditory  Judgment Impaired  Confusion Mild  Danger to Self  Current suicidal ideation? Denies  Self-Injurious Behavior No self-injurious ideation or behavior indicators observed or expressed   Agreement Not to Harm Self Yes  Description of Agreement verbal  Danger to Others  Danger to Others None reported or observed

## 2023-02-21 NOTE — Group Note (Signed)
Recreation Therapy Group Note   Group Topic:Coping Skills  Group Date: 02/21/2023 Start Time: 1012 End Time: 1030 Facilitators: Dannon Perlow-McCall, LRT,CTRS Location: [address] Dayroom   Group Topic: Coping Skills   Goal Area(s) Addresses: Patient will define what a coping skill is. Patient will successfully identify positive coping skills they can use post d/c.  Patient will acknowledge benefit(s) of using learned coping skills post d/c.  Group Description: Coping A to Z. Patient asked to identify what a coping skill is and when they use them. Patients with Clinical research associate discussed healthy versus unhealthy coping skills. Next patients were given a blank worksheet titled "Coping Skills A-Z". Patients were instructed to come up with at least one positive coping skill per letter of the alphabet.. Patients were given 15 minutes to brainstorm before ideas were presented to the large group. Patients and LRT debriefed on the importance of coping skill selection based on situation and back-up plans when a skill tried is not effective. At the end of group, patients were given an handout of alphabetized strategies to keep for future reference.   Education: Pharmacologist, Scientist, physiological, Discharge Planning.    Education Outcome: Acknowledges education/Verbalizes understanding/In group clarification offered/Additional education needed   Affect/Mood: N/A   Participation Level: Did not attend    Clinical Observations/Individualized Feedback:      Plan: Continue to engage patient in RT group sessions 2-3x/week.   Zela Sobieski-McCall, LRT,CTRS 02/21/2023 1:25 PM

## 2023-02-21 NOTE — Plan of Care (Signed)
  Problem: Safety: Goal: Periods of time without injury will increase Outcome: Progressing   Problem: Health Behavior/Discharge Planning: Goal: Compliance with treatment plan for underlying cause of condition will improve Outcome: Progressing

## 2023-02-21 NOTE — Group Note (Signed)
Date:  02/21/2023 Time:  9:42 AM  Group Topic/Focus:  Goals Group:   The focus of this group is to help patients establish daily goals to achieve during treatment and discuss how the patient can incorporate goal setting into their daily lives to aide in recovery.    Participation Level:  Did Not Attend    Donell Beers 02/21/2023, 9:42 AM

## 2023-02-21 NOTE — Progress Notes (Signed)
   02/20/23 2055  Psych Admission Type (Psych Patients Only)  Admission Status Voluntary  Psychosocial Assessment  Patient Complaints Suspiciousness  Eye Contact Staring;Watchful  Facial Expression Flat  Affect Preoccupied  Speech Tangential  Interaction Guarded  Motor Activity Slow  Appearance/Hygiene Unremarkable  Behavior Characteristics Cooperative;Appropriate to situation  Mood Suspicious  Thought Process  Coherency Loose associations;Tangential  Content Preoccupation  Delusions Paranoid  Perception Hallucinations  Hallucination Auditory  Judgment Limited  Confusion Mild  Danger to Self  Current suicidal ideation? Denies

## 2023-02-21 NOTE — Progress Notes (Signed)
     02/21/2023       10:02 AM   Athen Beamer   Type of Note: ACTT   Pt was accepted with [facility] team, pt was a past client of theirs and they are able to accept him back. EOL will come and see patient tomorrow to check in with him.   Signed:  Jeven Topper, LCSW-A 02/21/2023  10:02 AM

## 2023-02-22 DIAGNOSIS — F200 Paranoid schizophrenia: Secondary | ICD-10-CM | POA: Diagnosis not present

## 2023-02-22 NOTE — Progress Notes (Signed)
   02/22/23 2200  Psych Admission Type (Psych Patients Only)  Admission Status Voluntary  Psychosocial Assessment  Patient Complaints None  Eye Contact Fair  Facial Expression Flat  Affect Preoccupied  Speech Logical/coherent  Interaction Guarded;Minimal  Motor Activity Slow  Appearance/Hygiene Unremarkable  Behavior Characteristics Cooperative  Mood Pleasant;Suspicious  Thought Process  Coherency Disorganized  Content Preoccupation;Delusions  Delusions Paranoid  Perception Hallucinations  Hallucination Auditory;Visual  Judgment Impaired  Confusion Mild  Danger to Self  Current suicidal ideation? Denies  Self-Injurious Behavior No self-injurious ideation or behavior indicators observed or expressed   Agreement Not to Harm Self Yes  Description of Agreement Verbal  Danger to Others  Danger to Others None reported or observed

## 2023-02-22 NOTE — Progress Notes (Signed)
   02/21/23 2030  Psych Admission Type (Psych Patients Only)  Admission Status Voluntary  Psychosocial Assessment  Patient Complaints None  Eye Contact Staring  Facial Expression Flat  Affect Preoccupied  Speech Tangential  Interaction Guarded;Minimal  Motor Activity Slow  Appearance/Hygiene Unremarkable  Behavior Characteristics Cooperative;Guarded  Mood Suspicious  Thought Process  Coherency Disorganized  Content Preoccupation  Delusions Paranoid  Perception Hallucinations  Hallucination Auditory  Judgment Impaired  Confusion Mild  Danger to Self  Current suicidal ideation? Denies  Self-Injurious Behavior No self-injurious ideation or behavior indicators observed or expressed   Agreement Not to Harm Self Yes  Description of Agreement Verbal  Danger to Others  Danger to Others None reported or observed

## 2023-02-22 NOTE — Progress Notes (Signed)
[facility] MD Progress Note  02/22/2023 3:33 PM [name] Love  MRN:  [id number]  Subjective:   Pt is a 27 yo African American male with prior mental health diagnoses of P. Schizophrenia, Borderline Intellectual functioning, Cannabis use disorder who was transferred & admitted voluntarily to this [facility] on 12/02 after initially presenting to the [facility] on 11/29 with psychosis; Presented with disorganized thoughts, and making illogical statements, accompanied by his family members. As per documentation from the [facility]: "Pt states that he gets killed every time he states what's wrong. Pt states that his body talks to him, such as his butt. Pt denies SI, HI, AH and alcohol use at this present time. Pt endorses VH, stating that he sees letters and smoking synthetic herbs. Pt states that we can Dauria help him by providing him with liquids and blood work."    24-hour chart review: Vitals continue to be within normal limits, patient remains compliant with scheduled medications, no behavioral episodes in the past 24 hours, PRN medications overnight were trazodone, & hydroxyzine. Staff is having difficulty reaching pt's mother in an effort to get collateral info on pt. She will not answer phone.  Patient assessment: During assessment today, patient continues to answer questions in a nonlinear manner.  He is asked what brought you to the hospital?,  And response "movies".  Question is repeated, to which she answers "stupid".  Patient seems to be clueless regarding some social etiquette, as he passes flatulence right when writer is asking him questions, and does not apologize for it.  He is observed to be staring blankly at the walls, but will not state what he is staring at.  Today, writer asked him psychosis related questions multiple different ways, if he is having auditory/visual hallucinations, and he denied it each time.    Nursing staff reports that last night, he verbalized that he was seeing animals.  During  assessment with Clinical research associate, he denied SI, denies HI, denied AVH, denies paranoia, reported feeling safe on the unit.  Current presentation is an improvement from the presentation that patient exhibited at the time of admission.  He is however continuing to exhibit poverty of speech, which includes flight of ideas, tangentiality, and it is uncertain if this is patient's baseline of functioning, as patient has some intellectual disability at baseline.  Attempts to call patient's mother to verify if this is his baseline failed since she would not answer her phone, and has not returned calls to staff.  Writer again attempted to call mother today twice, and there was no answer.    CSW will be asked tomorrow during the morning treatment huddle if it would be appropriate to file a DSS report, since there are concerns that mother might be trying to abandon patient at the hospital.  As per chart review, patient's mother has taken patient to the hospital in the past more than once, asking for patient to be placed in a group home.  Patient is currently on Haldol solution and Abilify, which seem to be effective so far.  Patient denies medication related side effects currently.No TD/EPS type symptoms found on assessment, and pt denies any feelings of stiffness. AIMS: 0.  We are continuing current medications with no changes at this time.  We will continue to monitor.  Mood is remaining depressed, and affect is flat and congruent.  Patient reports that he is eating well, appetite is good, denies issues with bowel movements, states that sleep is good.  Labs reviewed, since patient  is on 2 antipsychotic medications, we are repeating EKG for QTc trending, since last one was on 11/29.  Continuing all medications at this time with no changes.  Principal Problem: Schizophrenia, paranoid type (HCC) Diagnosis: Principal Problem:   Schizophrenia, paranoid type (HCC) Active Problems:   Cannabis use disorder, severe, dependence  (HCC)   Borderline intellectual functioning   Cocaine use disorder (HCC)  Total Time spent with patient: 20 minutes  Past Psychiatric History:  Previous Psych Diagnoses: P. Schizophrenia  Prior inpatient treatment: Multiple times at this hospital on the C/A unit: 10/26/2023, 11/25/2014, 01/30/2025, 08/07/2015, 08/17/2015, 09/28/2015, and on the adult unit at this hospital on 03/23/2016, 03/22/2017. Current/prior outpatient treatment: [facility] Mixer, [facility].  Prior rehab hx: Denies  Psychotherapy hx: [facility] History of suicide attempt: Denies  History of homicide or aggression: As per collateral information obtained by the [facility], pt has aggressive behaviors at home  Psychiatric medication history: Abilify LAI in the past, Ambien, Depakote, Cogentin, Haldol, Ambien in the past as per chart review. Psychiatric medication compliance history: non compliant as per chart review Neuromodulation history: Denies  Current Psychiatrist:[facility] Current therapist: [facility]er    Past Medical History:  Past Medical History:  Diagnosis Date   Schizophrenia, acute (HCC)    History reviewed. No pertinent surgical history. Family History:  Family History  Problem Relation Age of Onset   Mental illness Cousin    Family Psychiatric  History: See H&P  Social History:  Social History   Substance and Sexual Activity  Alcohol Use Yes   Comment: a little      Social History   Substance and Sexual Activity  Drug Use No   Comment: denies    Social History   Socioeconomic History   Marital status: Single    Spouse name: Not on file   Number of children: Not on file   Years of education: Not on file   Highest education level: Not on file  Occupational History   Not on file  Tobacco Use   Smoking status: Every Day    Current packs/day: 0.00    Types: Cigarettes   Smokeless tobacco: Never  Substance and Sexual Activity   Alcohol use: Yes    Comment: a little    Drug use: No     Comment: denies   Sexual activity: Not Currently  Other Topics Concern   Not on file  Social History Narrative   Not on file   Social Determinants of Health   Financial Resource Strain: Not on file  Food Insecurity: Food Insecurity Present (02/14/2023)   Hunger Vital Sign    Worried About Running Out of Food in the Last Year: Sometimes true    Ran Out of Food in the Last Year: Sometimes true  Transportation Needs: Unmet Transportation Needs (02/14/2023)   PRAPARE - Administrator, Civil Service (Medical): Yes    Lack of Transportation (Non-Medical): Yes  Physical Activity: Not on file  Stress: Not on file  Social Connections: Not on file  Current Medications: Current Facility-Administered Medications  Medication Dose Route Frequency Provider Last Rate Last Admin   acetaminophen (TYLENOL) tablet 650 mg  650 mg Oral Q6H PRN Tomie China, MD   650 mg at 02/20/23 7253   alum & mag hydroxide-simeth (MAALOX/MYLANTA) 200-200-20 MG/5ML suspension 30 mL  30 mL Oral Q4H PRN Tomie China, MD       ARIPiprazole (ABILIFY) tablet 10 mg  10 mg Oral Daily Massengill, Harrold Donath, MD  10 mg at 02/22/23 0626   benztropine (COGENTIN) tablet 1 mg  1 mg Oral BID Massengill, Harrold Donath, MD   1 mg at 02/22/23 9485   haloperidol (HALDOL) tablet 5 mg  5 mg Oral TID PRN Phineas Inches, MD       And   diphenhydrAMINE (BENADRYL) capsule 50 mg  50 mg Oral TID PRN Phineas Inches, MD       haloperidol lactate (HALDOL) injection 5 mg  5 mg Intramuscular TID PRN Massengill, Harrold Donath, MD       And   diphenhydrAMINE (BENADRYL) injection 50 mg  50 mg Intramuscular TID PRN Massengill, Harrold Donath, MD       And   LORazepam (ATIVAN) injection 2 mg  2 mg Intramuscular TID PRN Massengill, Harrold Donath, MD       haloperidol lactate (HALDOL) injection 10 mg  10 mg Intramuscular TID PRN Massengill, Harrold Donath, MD       And   diphenhydrAMINE (BENADRYL) injection 50 mg  50 mg Intramuscular TID PRN Massengill,  Harrold Donath, MD       And   LORazepam (ATIVAN) injection 2 mg  2 mg Intramuscular TID PRN Massengill, Harrold Donath, MD       divalproex (DEPAKOTE ER) 24 hr tablet 750 mg  750 mg Oral QHS Massengill, Harrold Donath, MD   750 mg at 02/21/23 2044   haloperidol (HALDOL) 2 MG/ML solution 15 mg  15 mg Oral QHS Massengill, Harrold Donath, MD   15 mg at 02/21/23 2043   haloperidol (HALDOL) 2 MG/ML solution 5 mg  5 mg Oral Q0600 Massengill, Harrold Donath, MD   5 mg at 02/22/23 0809   hydrOXYzine (ATARAX) tablet 25 mg  25 mg Oral TID PRN Tomie China, MD   25 mg at 02/21/23 2045   magnesium hydroxide (MILK OF MAGNESIA) suspension 30 mL  30 mL Oral Daily PRN Tomie China, MD       traZODone (DESYREL) tablet 50 mg  50 mg Oral QHS PRN Tomie China, MD   50 mg at 02/21/23 2044   Vitamin D (Ergocalciferol) (DRISDOL) 1.25 MG (50000 UNIT) capsule 50,000 Units  50,000 Units Oral Q7 days Starleen Blue, NP   50,000 Units at 02/16/23 2126   white petrolatum (VASELINE) gel   Topical PRN Starleen Blue, NP        Lab Results:  No results found for this or any previous visit (from the past 48 hour(s)).   Blood Alcohol level:  Lab Results  Component Value Date   ETH <10 02/11/2023   ETH <10 02/06/2023    Metabolic Disorder Labs: Lab Results  Component Value Date   HGBA1C 5.3 02/11/2023   MPG 105.41 02/11/2023   MPG 117 11/27/2014   Lab Results  Component Value Date   PROLACTIN 57.5 (H) 08/20/2015   PROLACTIN 23.2 (H) 02/04/2015   Lab Results  Component Value Date   CHOL 202 (H) 02/11/2023   TRIG 46 02/11/2023   HDL 83 02/11/2023   CHOLHDL 2.4 02/11/2023   VLDL 9 02/11/2023   LDLCALC 110 (H) 02/11/2023   LDLCALC 152 (H) 07/15/2015    Physical Findings: AIMS:  , ,  ,  ,    CIWA:    COWS:     + pill-rolling tremor left upper extremity at rest No other appreciable tremor, rigidity, or cogwheeling appreciated on exam on 12/8  Musculoskeletal: Strength & Muscle Tone: within normal limits Gait & Station:  normal Patient leans: N/A  Psychiatric Specialty Exam:  Presentation  General Appearance:  Appropriate for Environment; Casual  Eye Contact: Minimal  Speech: Slow  Speech Volume: Decreased  Handedness: Right   Mood and Affect  Mood: Depressed  Affect: Congruent   Thought Process  Thought Processes: Disorganized  Descriptions of Associations:Tangential  Orientation:Partial  Thought Content:Illogical; Perseveration; Paranoid Ideation  History of Schizophrenia/Schizoaffective disorder:Yes  Duration of Psychotic Symptoms:Greater than six months  Hallucinations:Hallucinations: None   Ideas of Reference:Paranoia  Suicidal Thoughts:Suicidal Thoughts: No   Homicidal Thoughts:Homicidal Thoughts: No  Sensorium  Memory: Immediate Poor; Remote Poor  Judgment: Poor  Insight: Poor   Executive Functions  Concentration: Poor  Attention Span: Poor  Recall: Poor  Fund of Knowledge: Poor  Language: Poor   Psychomotor Activity  Psychomotor Activity: Psychomotor Activity: Normal    Assets  Assets: Resilience   Sleep  Sleep: Sleep: Good     Physical Exam: Physical Exam Vitals reviewed.  Constitutional:      General: He is not in acute distress.    Appearance: He is normal weight. He is not toxic-appearing.  Pulmonary:     Effort: Pulmonary effort is normal. No respiratory distress.  Neurological:     Mental Status: He is alert.     Motor: No weakness.     Gait: Gait normal.    Review of Systems  Constitutional:  Negative for chills and fever.  Cardiovascular:  Negative for chest pain and palpitations.  Neurological:  Negative for dizziness, tingling, tremors and headaches.  Psychiatric/Behavioral:  Positive for hallucinations and substance abuse. Negative for depression, memory loss and suicidal ideas. The patient is nervous/anxious. The patient does not have insomnia.   All other systems reviewed and are  negative.  Blood pressure 130/79, pulse 90, temperature 98 F (36.7 C), temperature source Oral, resp. rate 20, height 5\' 8"  (1.727 m), weight 74 kg, SpO2 100%. Body mass index is 24.81 kg/m.   Treatment Plan Summary: Daily contact with patient to assess and evaluate symptoms and progress in treatment and Medication management   ASSESSMENT:  Diagnoses / Active Problems: Schizophrenia, paranoid type (HCC)   Cannabis use disorder, severe, dependence (HCC)   Borderline intellectual functioning   Cocaine use disorder (HCC)  PLAN: Safety and Monitoring:  --  Voluntary admission to inpatient psychiatric unit for safety, stabilization and treatment  -- Daily contact with patient to assess and evaluate symptoms and progress in treatment  -- Patient's case to be discussed in multi-disciplinary team meeting  -- Observation Level : q15 minute checks  -- Vital signs:  q12 hours  -- Precautions: suicide, elopement, and assault  2. Psychiatric Diagnoses and Treatment:    Continue liquid Haldol 5 mg in the morning and 15 mg at bedtime for schizophrenia   Continue Cogentin 1 mg twice for pill-rolling tremor.    Continue Abilify 10 mg once daily for psychosis that has not responded to Haldol.  Patient reports that he has previous responded well to both Haldol and Abilify in the past.  Continue vitamin D supplementation  Continue hydroxyzine as needed for anxiety and trazodone as needed and insomnia  --  The risks/benefits/side-effects/alternatives to this medication were discussed in detail with the patient and time was given for questions. The patient consents to medication trial.      3. Medical Issues Being Addressed:     4. Discharge Planning:   -- Social work and case management to assist with discharge planning and identification of hospital follow-up needs prior to discharge  -- Estimated LOS: 5-7 more days  -- Discharge  Concerns: Need to establish a safety plan; Medication  compliance and effectiveness  -- Discharge Goals: Return home with outpatient referrals for mental health follow-up including medication management/psychotherapy   Starleen Blue, NP 02/22/2023, 3:33 PM  Total Time Spent in Direct Patient Care:  I personally spent 35 minutes on the unit in direct patient care. The direct patient care time included face-to-face time with the patient, reviewing the patient's chart, communicating with other professionals, and coordinating care. Greater than 50% of this time was spent in counseling or coordinating care with the patient regarding goals of hospitalization, psycho-education, and discharge planning needs.   Patient ID: Merrilyn Puma, male   DOB: 1995-06-07, 27 y.o.   MRN: [id number] Patient ID: Merrilyn Puma, male   DOB: 1995-10-03, 27 y.o.   MRN: [id number]

## 2023-02-22 NOTE — [facility] Group Notes (Signed)
[facility] Group Notes:  (Nursing/MHT/Case Management/Adjunct)  Date:  02/22/2023  Time:  1:01 PM Adult Psychoeducational Group Note  Date:  02/22/2023 Time:  1:01 PM  Group Topic/Focus:  Goals Group:   The focus of this group is to help patients establish daily goals to achieve during treatment and discuss how the patient can incorporate goal setting into their daily lives to aide in recovery.  Participation Level:  Active  Participation Quality:  Appropriate  Affect:  Appropriate  Cognitive:  Disorganized  Insight: Lacking  Engagement in Group:  Developing/Improving  Modes of Intervention:  Activity and Education  Additional Comments:  Pt did participate in group.  Marcelene Weidemann Alen Blew 02/22/2023, 1:01 PM

## 2023-02-22 NOTE — Plan of Care (Signed)

## 2023-02-22 NOTE — Group Note (Signed)
Recreation Therapy Group Note   Group Topic:Stress Management  Group Date: 02/22/2023 Start Time: 1045 End Time: 1110 Facilitators: Javaun Dimperio-McCall, LRT,CTRS Location: [address] Dayroom   Group Topic: Stress Management  Goal Area(s) Addresses:  Patient will identify positive stress management techniques. Patient will identify benefits of using stress management post d/c.  Intervention:   Group Description; LRT and patients discussed the importance of stress management. Patients sat and when through meditation. Patients listened to a meditation that focused on reducing stress, anxiety and worry. During the meditation, patients were guided through various breathing practices. Patience were to use the breathing patterns to engage in peace.  Education:  Stress Management, Discharge Planning.   Education Outcome: Acknowledges Education   Affect/Mood: Appropriate   Participation Level: Active   Participation Quality: Independent   Behavior: Attentive    Speech/Thought Process: Focused   Insight: Fair   Judgement: Fair    Modes of Intervention: Meditation   Patient Response to Interventions:  Attentive   Education Outcome:  In group clarification offered    Clinical Observations/Individualized Feedback: Pt came in late to group. Pt sat quietly and attentive during meditation. Pt was also focused during the recording. Pt stated he felt good after meditation was completed.     Plan: Continue to engage patient in RT group sessions 2-3x/week.   Feliciana Narayan-McCall, LRT,CTRS 02/22/2023 1:57 PM

## 2023-02-23 DIAGNOSIS — F200 Paranoid schizophrenia: Secondary | ICD-10-CM | POA: Diagnosis not present

## 2023-02-23 NOTE — [facility] Group Notes (Signed)
[facility] Group Notes:  (Nursing/MHT/Case Management/Adjunct)  Date:  02/23/2023  Time:  2000  Type of Therapy:   wrap up group  Participation Level:  Active  Participation Quality:  Appropriate  Affect:  Appropriate  Cognitive:  Alert and Appropriate  Insight:  Appropriate  Engagement in Group:  Engaged  Modes of Intervention:  Discussion  Summary of Progress/Problems: Pt. Stated his day were a 6 and that he were just ready to be discharge.  Fay Records 02/23/2023, 8:42 PM

## 2023-02-23 NOTE — Plan of Care (Signed)
  Problem: Coping: Goal: Ability to verbalize frustrations and anger appropriately will improve Outcome: Progressing   Problem: Physical Regulation: Goal: Ability to maintain clinical measurements within normal limits will improve Outcome: Progressing   Problem: Safety: Goal: Periods of time without injury will increase Outcome: Progressing

## 2023-02-23 NOTE — Progress Notes (Signed)
   02/23/23 0612  15 Minute Checks  Location Dayroom  Visual Appearance Calm  Behavior Composed  Sleep (Behavioral Health Patients Only)  Calculate sleep? (Click Yes once per 24 hr at 0600 safety check) Yes  Documented sleep last 24 hours 7.75

## 2023-02-23 NOTE — Progress Notes (Addendum)
Patient ID: [name] Love, male   DOB: [birth year], 27 y.o.   MRN: [id number] [facility] MD Progress Note  02/23/2023 3:16 PM [name] Love  MRN:  [id number]  Subjective:   Pt is a 27 yo African American male with prior mental health diagnoses of P. Schizophrenia, Borderline Intellectual functioning, Cannabis use disorder who was transferred & admitted voluntarily to this [facility] on 12/02 after initially presenting to the [facility] [address] on 11/29 with psychosis; Presented with disorganized thoughts, and making illogical statements, accompanied by his family members. As per documentation from the [facility]: "Pt states that he gets killed every time he states what's wrong. Pt states that his body talks to him, such as his butt. Pt denies SI, HI, AH and alcohol use at this present time. Pt endorses VH, stating that he sees letters and smoking synthetic herbs. Pt states that we can Luallen help him by providing him with liquids and blood work."    24-hour chart review: Vitals continue to be within normal limits, patient is compliant with medications, no behavioral episodes in the past 24 hours, PRN medications last night were trazodone & hydroxyzine. Staff continues to have difficulty reaching pt's mother so as to obtain collateral information.  Patient assessment: Today, there are no changes to assessment completed yesterday.  Patient denies SI, denies HI, denies AVH.  He states that the last time he had auditory hallucinations was yesterday, when asked what he heard he states that he heard voices singing happy birthday to him.  He goes on to state that he is birthday was on [birth year], which is accurate, and states that on the day he went to [employer]" with his mother who purchased pizza for him.  He denies visual hallucinations, denies paranoia, states that he feels safe on the unit.  He reports a good appetite, reports a good sleep quality, denies being in any physical pain, reports that last bowel movement was yesterday.  He is however  continuing to respond at times nonlinear lead to questions being asked and sometimes goes on a tangent in his responses to questions.  Patient is asked about the nature of the voices that he hears, and if they are inside of his head or outside of his head, and response "I be playing sports, then I called my mom."  Patient has some intellectual disabilities at baseline, and might be approaching his baseline, but we are unable to get in touch with mother to confirm this.  We will safety plan when we are able to get in touch with mother, and make discharge arrangements.  Continuing all medications as ordered, because it seems to be helping patient, since current presentation is an improvement from where he works at time of hospitalization.  EKG completed yesterday with QTc 413, we will continue to monitor.  It was ordered for QTc tracing due to patient being on 2 antipsychotic medications since last one was on 1129.  Addendum: CSW able to get in touch with patient's mother this afternoon, after CSW initiated safety check on her, due to her not ensuring forms from this hospital.  Please see CSW notes for details.  Patient also verbalized frustration at his mother not being able to visit him at the hospital.He told me that he asked for mother to come visit and that he loves her and she stated: "I'll see you when I see you. I gotta work"  Principal Problem: Schizophrenia, paranoid type (HCC) Diagnosis: Principal Problem:   Schizophrenia, paranoid type (HCC) Active Problems:  Cannabis use disorder, severe, dependence (HCC)   Borderline intellectual functioning   Cocaine use disorder (HCC)  Total Time spent with patient: 20 minutes  Past Psychiatric History:  Previous Psych Diagnoses: P. Schizophrenia  Prior inpatient treatment: Multiple times at this hospital on the C/A unit: 10/26/2023, 11/25/2014, 01/30/2025, 08/07/2015, 08/17/2015, 09/28/2015, and on the adult unit at this hospital on 03/23/2016,  03/22/2017. Current/prior outpatient treatment: [facility] Mixer, [facility].  Prior rehab hx: Denies  Psychotherapy hx: [facility] History of suicide attempt: Denies  History of homicide or aggression: As per collateral information obtained by the [facility], pt has aggressive behaviors at home  Psychiatric medication history: Abilify LAI in the past, Ambien, Depakote, Cogentin, Haldol, Ambien in the past as per chart review. Psychiatric medication compliance history: non compliant as per chart review Neuromodulation history: Denies  Current Psychiatrist:[facility] Current therapist: [facility]    Past Medical History:  Past Medical History:  Diagnosis Date   Schizophrenia, acute (HCC)    History reviewed. No pertinent surgical history. Family History:  Family History  Problem Relation Age of Onset   Mental illness Cousin    Family Psychiatric  History: See H&P  Social History:  Social History   Substance and Sexual Activity  Alcohol Use Yes   Comment: a little      Social History   Substance and Sexual Activity  Drug Use No   Comment: denies    Social History   Socioeconomic History   Marital status: Single    Spouse name: Not on file   Number of children: Not on file   Years of education: Not on file   Highest education level: Not on file  Occupational History   Not on file  Tobacco Use   Smoking status: Every Day    Current packs/day: 0.00    Types: Cigarettes   Smokeless tobacco: Never  Substance and Sexual Activity   Alcohol use: Yes    Comment: a little    Drug use: No    Comment: denies   Sexual activity: Not Currently  Other Topics Concern   Not on file  Social History Narrative   Not on file   Social Determinants of Health   Financial Resource Strain: Not on file  Food Insecurity: Food Insecurity Present (02/14/2023)   Hunger Vital Sign    Worried About Running Out of Food in the Last Year: Sometimes true    Ran Out of Food in the Last Year:  Sometimes true  Transportation Needs: Unmet Transportation Needs (02/14/2023)   PRAPARE - Administrator, Civil Service (Medical): Yes    Lack of Transportation (Non-Medical): Yes  Physical Activity: Not on file  Stress: Not on file  Social Connections: Not on file  Current Medications: Current Facility-Administered Medications  Medication Dose Route Frequency Provider Last Rate Last Admin   acetaminophen (TYLENOL) tablet 650 mg  650 mg Oral Q6H PRN Tomie China, MD   650 mg at 02/20/23 7829   alum & mag hydroxide-simeth (MAALOX/MYLANTA) 200-200-20 MG/5ML suspension 30 mL  30 mL Oral Q4H PRN Tomie China, MD       ARIPiprazole (ABILIFY) tablet 10 mg  10 mg Oral Daily Massengill, Nathan, MD   10 mg at 02/23/23 0827   benztropine (COGENTIN) tablet 1 mg  1 mg Oral BID Massengill, Harrold Donath, MD   1 mg at 02/23/23 5621   haloperidol (HALDOL) tablet 5 mg  5 mg Oral TID PRN Phineas Inches, MD  And   diphenhydrAMINE (BENADRYL) capsule 50 mg  50 mg Oral TID PRN Massengill, Harrold Donath, MD       haloperidol lactate (HALDOL) injection 5 mg  5 mg Intramuscular TID PRN Massengill, Harrold Donath, MD       And   diphenhydrAMINE (BENADRYL) injection 50 mg  50 mg Intramuscular TID PRN Massengill, Harrold Donath, MD       And   LORazepam (ATIVAN) injection 2 mg  2 mg Intramuscular TID PRN Massengill, Harrold Donath, MD       haloperidol lactate (HALDOL) injection 10 mg  10 mg Intramuscular TID PRN Massengill, Harrold Donath, MD       And   diphenhydrAMINE (BENADRYL) injection 50 mg  50 mg Intramuscular TID PRN Massengill, Harrold Donath, MD       And   LORazepam (ATIVAN) injection 2 mg  2 mg Intramuscular TID PRN Massengill, Harrold Donath, MD       divalproex (DEPAKOTE ER) 24 hr tablet 750 mg  750 mg Oral QHS Massengill, Harrold Donath, MD   750 mg at 02/22/23 2042   haloperidol (HALDOL) 2 MG/ML solution 15 mg  15 mg Oral QHS Massengill, Harrold Donath, MD   15 mg at 02/22/23 2042   haloperidol (HALDOL) 2 MG/ML solution 5 mg  5 mg Oral Q0600  Massengill, Harrold Donath, MD   5 mg at 02/23/23 0827   hydrOXYzine (ATARAX) tablet 25 mg  25 mg Oral TID PRN Tomie China, MD   25 mg at 02/22/23 2042   magnesium hydroxide (MILK OF MAGNESIA) suspension 30 mL  30 mL Oral Daily PRN Tomie China, MD       traZODone (DESYREL) tablet 50 mg  50 mg Oral QHS PRN Tomie China, MD   50 mg at 02/22/23 2042   Vitamin D (Ergocalciferol) (DRISDOL) 1.25 MG (50000 UNIT) capsule 50,000 Units  50,000 Units Oral Q7 days Starleen Blue, NP   50,000 Units at 02/16/23 2126   white petrolatum (VASELINE) gel   Topical PRN Starleen Blue, NP        Lab Results:  No results found for this or any previous visit (from the past 48 hour(s)).   Blood Alcohol level:  Lab Results  Component Value Date   ETH <10 02/11/2023   ETH <10 02/06/2023    Metabolic Disorder Labs: Lab Results  Component Value Date   HGBA1C 5.3 02/11/2023   MPG 105.41 02/11/2023   MPG 117 11/27/2014   Lab Results  Component Value Date   PROLACTIN 57.5 (H) 08/20/2015   PROLACTIN 23.2 (H) 02/04/2015   Lab Results  Component Value Date   CHOL 202 (H) 02/11/2023   TRIG 46 02/11/2023   HDL 83 02/11/2023   CHOLHDL 2.4 02/11/2023   VLDL 9 02/11/2023   LDLCALC 110 (H) 02/11/2023   LDLCALC 152 (H) 07/15/2015    Physical Findings: AIMS:  , ,  ,  ,    CIWA:    COWS:     + pill-rolling tremor left upper extremity at rest No other appreciable tremor, rigidity, or cogwheeling appreciated on exam on 12/8  Musculoskeletal: Strength & Muscle Tone: within normal limits Gait & Station: normal Patient leans: N/A  Psychiatric Specialty Exam:  Presentation  General Appearance:  Casual  Eye Contact: Fair  Speech: Blocked  Speech Volume: Decreased  Handedness: Right   Mood and Affect  Mood: Depressed  Affect: Blunt   Thought Process  Thought Processes: Disorganized  Descriptions of Associations:Tangential  Orientation:Partial  Thought  Content:Tangential  History of Schizophrenia/Schizoaffective disorder:Yes  Duration of Psychotic Symptoms:Greater than six months  Hallucinations:Hallucinations: Auditory   Ideas of Reference:None  Suicidal Thoughts:Suicidal Thoughts: No   Homicidal Thoughts:Homicidal Thoughts: No  Sensorium  Memory: Recent Poor  Judgment: Poor  Insight: Poor   Executive Functions  Concentration: Poor  Attention Span: Poor  Recall: Poor  Fund of Knowledge: Poor  Language: Poor   Psychomotor Activity  Psychomotor Activity: Psychomotor Activity: Normal    Assets  Assets: Resilience   Sleep  Sleep: Sleep: Good     Physical Exam: Physical Exam Vitals reviewed.  Constitutional:      General: He is not in acute distress.    Appearance: He is normal weight. He is not toxic-appearing.  Pulmonary:     Effort: Pulmonary effort is normal. No respiratory distress.  Neurological:     Mental Status: He is alert.     Motor: No weakness.     Gait: Gait normal.    Review of Systems  Constitutional:  Negative for chills and fever.  Cardiovascular:  Negative for chest pain and palpitations.  Neurological:  Negative for dizziness, tingling, tremors and headaches.  Psychiatric/Behavioral:  Positive for hallucinations and substance abuse. Negative for depression, memory loss and suicidal ideas. The patient is nervous/anxious. The patient does not have insomnia.   All other systems reviewed and are negative.  Blood pressure 120/76, pulse 90, temperature 98.2 F (36.8 C), temperature source Oral, resp. rate 20, height 5\' 8"  (1.727 m), weight 74 kg, SpO2 100%. Body mass index is 24.81 kg/m.   Treatment Plan Summary: Daily contact with patient to assess and evaluate symptoms and progress in treatment and Medication management   ASSESSMENT:  Diagnoses / Active Problems: Schizophrenia, paranoid type (HCC)   Cannabis use disorder, severe, dependence (HCC)    Borderline intellectual functioning   Cocaine use disorder (HCC)  PLAN: Safety and Monitoring:  --  Voluntary admission to inpatient psychiatric unit for safety, stabilization and treatment  -- Daily contact with patient to assess and evaluate symptoms and progress in treatment  -- Patient's case to be discussed in multi-disciplinary team meeting  -- Observation Level : q15 minute checks  -- Vital signs:  q12 hours  -- Precautions: suicide, elopement, and assault  2. Psychiatric Diagnoses and Treatment:    Continue liquid Haldol 5 mg in the morning and 15 mg at bedtime for schizophrenia   Continue Cogentin 1 mg twice for pill-rolling tremor.    Continue Abilify 10 mg once daily for psychosis that has not responded to Haldol.  Patient reports that he has previous responded well to both Haldol and Abilify in the past.  Continue vitamin D supplementation  Continue hydroxyzine as needed for anxiety and trazodone as needed and insomnia  --  The risks/benefits/side-effects/alternatives to this medication were discussed in detail with the patient and time was given for questions. The patient consents to medication trial.      3. Medical Issues Being Addressed:     4. Discharge Planning:   -- Social work and case management to assist with discharge planning and identification of hospital follow-up needs prior to discharge  -- Estimated LOS: 5-7 more days  -- Discharge Concerns: Need to establish a safety plan; Medication compliance and effectiveness  -- Discharge Goals: Return home with outpatient referrals for mental health follow-up including medication management/psychotherapy   Starleen Blue, NP 02/23/2023, 3:16 PM  Total Time Spent in Direct Patient Care:  I personally spent 45 minutes on the unit in direct patient care.  The direct patient care time included face-to-face time with the patient, reviewing the patient's chart, communicating with other professionals, and  coordinating care. Greater than 50% of this time was spent in counseling or coordinating care with the patient regarding goals of hospitalization, psycho-education, and discharge planning needs.   Patient ID: [name] Love, male   DOB: Jun 22, 1995, 27 y.o.   MRN: [id number] Patient ID: [name] Love, male   DOB: [birth year], 27 y.o.   MRN: [id number]

## 2023-02-23 NOTE — Progress Notes (Addendum)
Pt visible in dayroom / milieu majority of this shift. Observed to be guarded, minimal, paranoid, suspicious with blunted affect and but fair eye contact when engaged. Denies SI, HI, AVH and pain when assessed. 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 I will take care of all of y'all. Now I hurt my finger but my heart and my head hurts from that too. I need to get in the car to go out there now because I need pizza and fudge rains". However, pt has been cooperative with care and remains medication compliant with mouth checks. Off unit for meals and gym, attended scheduled groups without issues. Attended to his ADLs this shift. Safety checks maintained at Q 15 minutes intervals without issues. Support, encouragement and reassurance offered.

## 2023-02-23 NOTE — Progress Notes (Addendum)
CSW  requested a Wellness Check from [place] [employer] due [facility] Staff not being able to contact mother, [name], [address] [place], [place], [place] [address], awaiting call back.    02/23/23 at 2:54 Officer Daphine Deutscher with [place] police not able to reach pt's mother. Will make another attempt.

## 2023-02-23 NOTE — Progress Notes (Signed)
   02/23/23 2100  Psych Admission Type (Psych Patients Only)  Admission Status Voluntary  Psychosocial Assessment  Patient Complaints None  Eye Contact Brief  Facial Expression Flat  Affect Blunted  Speech Logical/coherent  Interaction Minimal  Motor Activity Slow  Appearance/Hygiene Unremarkable  Behavior Characteristics Cooperative  Mood Pleasant;Suspicious  Thought Process  Coherency Disorganized;Loose associations  Content Preoccupation;Delusions  Delusions None reported or observed  Perception Hallucinations  Hallucination Auditory  Judgment Impaired  Confusion Mild  Danger to Self  Current suicidal ideation? Denies  Self-Injurious Behavior No self-injurious ideation or behavior indicators observed or expressed   Agreement Not to Harm Self Yes  Description of Agreement Verbal  Danger to Others  Danger to Others None reported or observed

## 2023-02-23 NOTE — [facility] Suicide Risk Assessment (Signed)
[facility] INPATIENT:  Family/Significant Other Suicide Prevention Education  Suicide Prevention Education:  Family/Significant Other Refusal to Support Patient after Discharge:  Suicide Prevention Education Not Provided:  Patient has identified home of family/significant other as the place the patient will be residing after discharge.  With written consent of the patient, two attempts were made to provide Suicide Prevention Education to [employer] (mom) [phone] , (name of family member/significant other).  This person indicates he/she will not be responsible for the patient after discharge.  Was able to get in contact with mother who reports that patient has an ACTT team through [place] and is working with [employer]. Mother is also working with [facility] case manager to find patient a group home to discharge to. Mom reports that ideally patient will not discharge back to her home.   Mother is coming to visit this evening to see if patient is back at baseline. Per moms report, she states that she has talked to patient on the phone today and says that based on that conversation he is not at baseline.   Kathi Der 02/23/2023,3:01 PM

## 2023-02-23 NOTE — [facility] Group Notes (Signed)
Adult Psychoeducational Group Note  Date:  02/23/2023 Time:  7:49 PM  Group Topic/Focus:  Goals Group:   The focus of this group is to help patients establish daily goals to achieve during treatment and discuss how the patient can incorporate goal setting into their daily lives to aide in recovery. Orientation:   The focus of this group is to educate the patient on the purpose and policies of crisis stabilization and provide a format to answer questions about their admission.  The group details unit policies and expectations of patients while admitted.  Participation Level:  Active  Participation Quality:  Appropriate  Affect:  Appropriate  Cognitive:  Appropriate  Insight: Appropriate  Engagement in Group:  Engaged  Modes of Intervention:  Discussion  Additional Comments:  Pt attended the goals group and remained appropriate and engaged throughout the duration of the group.   Sheran Lawless 02/23/2023, 7:49 PM

## 2023-02-23 NOTE — Group Note (Signed)
Recreation Therapy Group Note   Group Topic:Leisure Education  Group Date: 02/23/2023 Start Time: 1023 End Time: 1043 Facilitators: Ersa Delaney-McCall, LRT,CTRS Location: [address] Dayroom   Group Topic: Leisure Education  Goal Area(s) Addresses:  Patient will identify positive leisure activities for use post discharge. Patient will identify at least one positive benefit of participation in leisure activities.   Intervention: Music, [employer]   Group Description: [employer] It [employer]or. Patients were placed in a circle and given a beach ball. Patients were to hit the ball to each other without the ball coming to a complete stop on the floor. LRT would time the patients to see how long they could keep the ball in play. If the ball came to a stop, the time would start over.  Education: Leisure Scientist, physiological, Special educational needs teacher, Teamwork, Discharge Planning  Education Outcome: Acknowledges education/In group clarification offered/Needs additional education.    Affect/Mood: Appropriate   Participation Level: Engaged   Participation Quality: Independent   Behavior: Appropriate and Cooperative   Speech/Thought Process: Focused   Insight: Fair   Judgement: Fair    Modes of Intervention: Cooperative Play   Patient Response to Interventions:  Engaged   Education Outcome:  In group clarification offered    Clinical Observations/Individualized Feedback: Pt was appropriate and able to focus on the activity. Pt was engaged and interactive with peers in completing the activity. Pt appeared to be having a good time and enjoying himself. Pt expressed liking to dance in his leisure. Pt explain he goes about it by "cut the tv off, plug it back in and go eat".    Plan: Continue to engage patient in RT group sessions 2-3x/week.   Advik Weatherspoon-McCall, LRT,CTRS  02/23/2023 12:28 PM

## 2023-02-24 DIAGNOSIS — F200 Paranoid schizophrenia: Secondary | ICD-10-CM | POA: Diagnosis not present

## 2023-02-24 MED ORDER — DIVALPROEX SODIUM 250 MG PO DR TAB
750.0000 mg | DELAYED_RELEASE_TABLET | Freq: Once | ORAL | Status: AC
  Administered 2023-02-24: 750 mg via ORAL
  Filled 2023-02-24: qty 3

## 2023-02-24 MED ORDER — DIVALPROEX SODIUM ER 250 MG PO TB24
750.0000 mg | ORAL_TABLET | Freq: Two times a day (BID) | ORAL | Status: DC
  Administered 2023-02-25 – 2023-03-03 (×13): 750 mg via ORAL
  Filled 2023-02-24: qty 42
  Filled 2023-02-24: qty 3
  Filled 2023-02-24: qty 42
  Filled 2023-02-24 (×16): qty 3

## 2023-02-24 MED ORDER — ARIPIPRAZOLE 15 MG PO TABS
15.0000 mg | ORAL_TABLET | Freq: Every day | ORAL | Status: DC
  Administered 2023-02-25 – 2023-02-27 (×3): 15 mg via ORAL
  Filled 2023-02-24 (×4): qty 1

## 2023-02-24 NOTE — Progress Notes (Signed)
   02/24/23 2100  Psych Admission Type (Psych Patients Only)  Admission Status Voluntary  Psychosocial Assessment  Patient Complaints None  Eye Contact Fair  Facial Expression Flat  Affect Preoccupied;Blunted  Speech Logical/coherent  Interaction Minimal  Motor Activity Slow  Appearance/Hygiene Unremarkable  Behavior Characteristics Cooperative;Appropriate to situation  Mood Anxious;Preoccupied  Thought Process  Coherency Loose associations  Content Preoccupation;Obsessions  Delusions Paranoid  Perception Hallucinations  Hallucination Auditory  Judgment Impaired  Confusion None  Danger to Self  Current suicidal ideation? Denies  Self-Injurious Behavior No self-injurious ideation or behavior indicators observed or expressed   Agreement Not to Harm Self Yes  Description of Agreement verbal  Danger to Others  Danger to Others None reported or observed

## 2023-02-24 NOTE — Progress Notes (Signed)
Patient ID: [name] Love, male   DOB: November 06, 1995, 27 y.o.   MRN: [id number] [facility] MD Progress Note  02/24/2023 2:48 PM [name] Love  MRN:  [id number]  Subjective:   Pt is a 27 yo African American male with prior mental health diagnoses of P. Schizophrenia, Borderline Intellectual functioning, Cannabis use disorder who was transferred & admitted voluntarily to this [facility] on 12/02 after initially presenting to the [facility] on 11/29 with psychosis; Presented with disorganized thoughts, and making illogical statements, accompanied by his family members. As per documentation from the [facility]: "Pt states that he gets killed every time he states what's wrong. Pt states that his body talks to him, such as his butt. Pt denies SI, HI, AH and alcohol use at this present time. Pt endorses VH, stating that he sees letters and smoking synthetic herbs. Pt states that we can [place] help him by providing him with liquids and blood work."    Today's assessment notes: On assessment today, patient was cooperative initially.  He reports his mood is better however, report high anxiety and rates as #10/10, with 10 being high severity.  When inquired about his anxiety patient report, it is my own anxiety and I cannot share it with anyone else.  Patient became very tangential from now on, reporting he is from [place], that he does not smoke, and that he wants to be discharged.  He reports he plays basketball and he is very good at making a lot of money.  Reports his goal today is to watch TV and requesting this provider to call his mother to find out how happy she is at the hospital.  Abilify increased from 10 mg p.o. daily to 15 mg p.o. daily starting tomorrow 02/25/2023.  Depakote DR increased from 750 mg p.o. daily to twice daily starting tomorrow 02/25/2023.  Depakote DR 750 mg p.o. initiated today x 1 dose only.  Last VPA level was 39 on 02/19/2023.  VPA level to be obtained on Monday, 02/28/2023. Sleep is improved, nursing staff  report patient sleeping over 8 hours last night Appetite is good Concentration is poor Energy level is adequate Denies suicidal thoughts.  Denies suicidal intent and plan.  Denies having any HI.  Denies having psychotic symptoms.   Denies having side effects to current psychiatric medications.   We discussed changes to current medication regimen, including increasing Depakote 750 mg p.o. daily to 750 mg p.o. twice daily for mood stabilization starting 02/25/2023.  And Abilify increased from 10 mg p.o. daily to 15 mg p.o. daily starting tomorrow 02/25/2023.  Patient in agreement with medication adjustment.  Addendum: Patient reports that his mother visited him last night, and they had a pleasant visit.   Principal Problem: Schizophrenia, paranoid type (HCC) Diagnosis: Principal Problem:   Schizophrenia, paranoid type (HCC) Active Problems:   Cannabis use disorder, severe, dependence (HCC)   Borderline intellectual functioning   Cocaine use disorder (HCC)  Total Time spent with patient: 45 minutes  Past Psychiatric History:  Previous Psych Diagnoses: P. Schizophrenia  Prior inpatient treatment: Multiple times at this hospital on the C/A unit: 10/26/2023, 11/25/2014, 01/30/2025, 08/07/2015, 08/17/2015, 09/28/2015, and on the adult unit at this hospital on 03/23/2016, 03/22/2017. Current/prior outpatient treatment: [facility] Mixer, [facility].  Prior rehab hx: Denies  Psychotherapy hx: [facility] History of suicide attempt: Denies  History of homicide or aggression: As per collateral information obtained by the [facility], pt has aggressive behaviors at home  Psychiatric medication history: Abilify LAI in  the past, Ambien, Depakote, Cogentin, Haldol, Ambien in the past as per chart review. Psychiatric medication compliance history: non compliant as per chart review Neuromodulation history: Denies  Current Psychiatrist:[facility] Current therapist: [facility] Mixer   Past Medical History:   Past Medical History:  Diagnosis Date   Schizophrenia, acute (HCC)    History reviewed. No pertinent surgical history. Family History:  Family History  Problem Relation Age of Onset   Mental illness Cousin    Family Psychiatric  History: See H&P  Social History:  Social History   Substance and Sexual Activity  Alcohol Use Yes   Comment: a little      Social History   Substance and Sexual Activity  Drug Use No   Comment: denies    Social History   Socioeconomic History   Marital status: Single    Spouse name: Not on file   Number of children: Not on file   Years of education: Not on file   Highest education level: Not on file  Occupational History   Not on file  Tobacco Use   Smoking status: Every Day    Current packs/day: 0.00    Types: Cigarettes   Smokeless tobacco: Never  Substance and Sexual Activity   Alcohol use: Yes    Comment: a little    Drug use: No    Comment: denies   Sexual activity: Not Currently  Other Topics Concern   Not on file  Social History Narrative   Not on file   Social Drivers of Health   Financial Resource Strain: Not on file  Food Insecurity: Food Insecurity Present (02/14/2023)   Hunger Vital Sign    Worried About Running Out of Food in the Last Year: Sometimes true    Ran Out of Food in the Last Year: Sometimes true  Transportation Needs: Unmet Transportation Needs (02/14/2023)   PRAPARE - Administrator, Civil Service (Medical): Yes    Lack of Transportation (Non-Medical): Yes  Physical Activity: Not on file  Stress: Not on file  Social Connections: Not on file  Current Medications: Current Facility-Administered Medications  Medication Dose Route Frequency Provider Last Rate Last Admin   acetaminophen (TYLENOL) tablet 650 mg  650 mg Oral Q6H PRN Tomie China, MD   650 mg at 02/24/23 1244   alum & mag hydroxide-simeth (MAALOX/MYLANTA) 200-200-20 MG/5ML suspension 30 mL  30 mL Oral Q4H PRN Tomie China, MD       [START ON 02/25/2023] ARIPiprazole (ABILIFY) tablet 15 mg  15 mg Oral Daily Rocklyn Mayberry, Jesusita Oka, FNP       benztropine (COGENTIN) tablet 1 mg  1 mg Oral BID Massengill, Harrold Donath, MD   1 mg at 02/24/23 0759   haloperidol (HALDOL) tablet 5 mg  5 mg Oral TID PRN Phineas Inches, MD       And   diphenhydrAMINE (BENADRYL) capsule 50 mg  50 mg Oral TID PRN Massengill, Harrold Donath, MD       haloperidol lactate (HALDOL) injection 5 mg  5 mg Intramuscular TID PRN Massengill, Harrold Donath, MD       And   diphenhydrAMINE (BENADRYL) injection 50 mg  50 mg Intramuscular TID PRN Massengill, Harrold Donath, MD       And   LORazepam (ATIVAN) injection 2 mg  2 mg Intramuscular TID PRN Massengill, Harrold Donath, MD       haloperidol lactate (HALDOL) injection 10 mg  10 mg Intramuscular TID PRN Phineas Inches, MD       And  diphenhydrAMINE (BENADRYL) injection 50 mg  50 mg Intramuscular TID PRN Massengill, Harrold Donath, MD       And   LORazepam (ATIVAN) injection 2 mg  2 mg Intramuscular TID PRN Phineas Inches, MD       Melene Muller ON 02/25/2023] divalproex (DEPAKOTE ER) 24 hr tablet 750 mg  750 mg Oral Q12H Orlene Salmons C, FNP       divalproex (DEPAKOTE) DR tablet 750 mg  750 mg Oral Once Dillie Burandt C, FNP       haloperidol (HALDOL) 2 MG/ML solution 15 mg  15 mg Oral QHS Massengill, Harrold Donath, MD   15 mg at 02/23/23 2024   haloperidol (HALDOL) 2 MG/ML solution 5 mg  5 mg Oral Q0600 Massengill, Harrold Donath, MD   5 mg at 02/24/23 0759   hydrOXYzine (ATARAX) tablet 25 mg  25 mg Oral TID PRN Tomie China, MD   25 mg at 02/24/23 1244   magnesium hydroxide (MILK OF MAGNESIA) suspension 30 mL  30 mL Oral Daily PRN Tomie China, MD       traZODone (DESYREL) tablet 50 mg  50 mg Oral QHS PRN Tomie China, MD   50 mg at 02/23/23 2024   Vitamin D (Ergocalciferol) (DRISDOL) 1.25 MG (50000 UNIT) capsule 50,000 Units  50,000 Units Oral Q7 days Starleen Blue, NP   50,000 Units at 02/23/23 2025   white petrolatum (VASELINE) gel    Topical PRN Starleen Blue, NP       Lab Results:  No results found for this or any previous visit (from the past 48 hours).  Blood Alcohol level:  Lab Results  Component Value Date   ETH <10 02/11/2023   ETH <10 02/06/2023   Metabolic Disorder Labs: Lab Results  Component Value Date   HGBA1C 5.3 02/11/2023   MPG 105.41 02/11/2023   MPG 117 11/27/2014   Lab Results  Component Value Date   PROLACTIN 57.5 (H) 08/20/2015   PROLACTIN 23.2 (H) 02/04/2015   Lab Results  Component Value Date   CHOL 202 (H) 02/11/2023   TRIG 46 02/11/2023   HDL 83 02/11/2023   CHOLHDL 2.4 02/11/2023   VLDL 9 02/11/2023   LDLCALC 110 (H) 02/11/2023   LDLCALC 152 (H) 07/15/2015    Physical Findings: AIMS:  , ,  ,  ,    CIWA:    COWS:     + pill-rolling tremor left upper extremity at rest No other appreciable tremor, rigidity, or cogwheeling appreciated on exam on 12/8  Musculoskeletal: Strength & Muscle Tone: within normal limits Gait & Station: normal Patient leans: N/A  Psychiatric Specialty Exam:  Presentation  General Appearance:  Appropriate for Environment; Casual; Fairly Groomed  Eye Contact: Fair  Speech: Clear and Coherent  Speech Volume: Normal  Handedness: Right  Mood and Affect  Mood: Anxious; Depressed  Affect: Blunt  Thought Process  Thought Processes: Disorganized  Descriptions of Associations:Tangential  Orientation:Partial  Thought Content:Tangential  History of Schizophrenia/Schizoaffective disorder:Yes  Duration of Psychotic Symptoms:Greater than six months  Hallucinations:Hallucinations: None Description of Auditory Hallucinations: Denies hearing voices today  Ideas of Reference:None  Suicidal Thoughts:Suicidal Thoughts: No  Homicidal Thoughts:Homicidal Thoughts: No  Sensorium  Memory: Immediate Poor; Recent Poor  Judgment: Poor  Insight: Poor  Executive Functions  Concentration: Fair  Attention  Span: Poor  Recall: Poor  Fund of Knowledge: Poor  Language: Fair  Psychomotor Activity  Psychomotor Activity: Psychomotor Activity: Normal  Assets  Assets: Resilience; Physical Health  Sleep  Sleep: Sleep: Good  Number of Hours of Sleep: 8  Physical Exam: Physical Exam Vitals reviewed.  Constitutional:      General: He is not in acute distress.    Appearance: He is normal weight. He is not toxic-appearing.  HENT:     Mouth/Throat:     Mouth: Mucous membranes are moist.  Cardiovascular:     Rate and Rhythm: Normal rate.     Pulses: Normal pulses.  Pulmonary:     Effort: Pulmonary effort is normal. No respiratory distress.  Abdominal:     Comments: Deferred  Genitourinary:    Comments: Deferred Musculoskeletal:        General: Normal range of motion.     Cervical back: Normal range of motion.  Neurological:     Mental Status: He is alert.     Motor: No weakness.     Gait: Gait normal.  Psychiatric:     Comments: Restless and tangential    Review of Systems  Constitutional:  Negative for chills and fever.  HENT:  Negative for sore throat.   Eyes:  Negative for blurred vision.  Respiratory:  Negative for cough, sputum production, shortness of breath and wheezing.   Cardiovascular:  Negative for chest pain and palpitations.  Gastrointestinal:  Negative for heartburn and nausea.  Genitourinary:  Negative for dysuria, frequency and urgency.  Musculoskeletal:  Negative for myalgias.  Neurological:  Negative for dizziness, tingling, tremors and headaches.  Endo/Heme/Allergies:        See allergy listing  Psychiatric/Behavioral:  Negative for depression, hallucinations, memory loss, substance abuse and suicidal ideas. The patient is nervous/anxious. The patient does not have insomnia.   All other systems reviewed and are negative.  Blood pressure (!) 125/91, pulse 89, temperature 98.2 F (36.8 C), temperature source Oral, resp. rate 20, height 5\' 8"  (1.727  m), weight 74 kg, SpO2 100%. Body mass index is 24.81 kg/m.  Treatment Plan Summary: Daily contact with patient to assess and evaluate symptoms and progress in treatment and Medication management  ASSESSMENT:  Diagnoses / Active Problems: Schizophrenia, paranoid type (HCC)   Cannabis use disorder, severe, dependence (HCC)   Borderline intellectual functioning   Cocaine use disorder (HCC)  PLAN: Safety and Monitoring:  --  Voluntary admission to inpatient psychiatric unit for safety, stabilization and treatment  -- Daily contact with patient to assess and evaluate symptoms and progress in treatment  -- Patient's case to be discussed in multi-disciplinary team meeting  -- Observation Level : q15 minute checks  -- Vital signs:  q12 hours  -- Precautions: suicide, elopement, and assault  2. Psychiatric Diagnoses and Treatment:   Increase Depakote from 750 mg p.o. daily to twice daily starting 02/25/2023  Initiate Depakote 750 mg p.o. once today 02/24/2023 for VPA level of 39.  Obtain VPA level on Monday, 02/28/2023   Continue liquid Haldol 5 mg in the morning and 15 mg at bedtime for schizophrenia   Continue Cogentin 1 mg twice for pill-rolling tremor.    Increase Abilify from 10 mg to 15 mg p.o. once daily for psychosis that has not responded to Haldol.  Patient reports that he has previous responded well to both Haldol and Abilify in the past.  Continue vitamin D supplementation  Continue hydroxyzine as needed for anxiety and trazodone as needed and insomnia  --  The risks/benefits/side-effects/alternatives to this medication were discussed in detail with the patient and time was given for questions. The patient consents to medication trial.    3. Medical Issues  Being Addressed:    4. Discharge Planning:   -- Social work and case management to assist with discharge planning and identification of hospital follow-up needs prior to discharge  -- Estimated LOS: 5-7 more  days  -- Discharge Concerns: Need to establish a safety plan; Medication compliance and effectiveness  -- Discharge Goals: Return home with outpatient referrals for mental health follow-up including medication management/psychotherapy  Cecilie Lowers, FNP 02/24/2023, 2:48 PM  Patient ID: [name] Love, male   DOB: 1996-02-07, 27 y.o.   MRN: [id number] Patient ID: [name] Love, male   DOB: July 12, 1995, 27 y.o.   MRN: [id number] Patient ID: [name] Love, male   DOB: [birth year], 27 y.o.   MRN: [id number]

## 2023-02-24 NOTE — Progress Notes (Signed)
The patient has been making random comments this evening, ie. He asked this Thereasa Parkin if he preferred "fishing" or a "favorite number". He also began to talk about the newest [place] S[place] in [place] and then how beautiful it is in [place]. The nurse has been notified.

## 2023-02-24 NOTE — [facility] Group Notes (Signed)
[facility] Group Notes:  (Nursing/MHT/Case Management/Adjunct)  Date:  02/24/2023  Time:  8:26 PM  Type of Therapy:  Psychoeducational Skills  Participation Level:  Active  Participation Quality:  Appropriate  Affect:  Appropriate  Cognitive:  Lacking  Insight:  Limited  Engagement in Group:  Limited  Modes of Intervention:  Education  Summary of Progress/Problems: The patient rated his day as a 9 out of 10. He stated that his day started out cold and ended up "warm". He then stated that he felt warm like "penguins" cuddling up together for warmth (?). His goal for tomorrow is to get more rest and to get discharged.   Hazle Coca S 02/24/2023, 8:26 PM

## 2023-02-24 NOTE — Group Note (Signed)
Occupational Therapy Group Note  Group Topic:Coping Skills  Group Date: 02/24/2023 Start Time: 1430 End Time: 1500 Facilitators: Ted Mcalpine, OT   Group Description: Group encouraged increased engagement and participation through discussion and activity focused on "Coping Ahead." Patients were split up into teams and selected a card from a stack of positive coping strategies. Patients were instructed to act out/charade the coping skill for other peers to guess and receive points for their team. Discussion followed with a focus on identifying additional positive coping strategies and patients shared how they were going to cope ahead over the weekend while continuing hospitalization stay.  Therapeutic Goal(s): Identify positive vs negative coping strategies. Identify coping skills to be used during hospitalization vs coping skills outside of hospital/at home Increase participation in therapeutic group environment and promote engagement in treatment   Participation Level: Did not attend                              Plan: Continue to engage patient in OT groups 2 - 3x/week.  02/24/2023  Ted Mcalpine, OT  Kerrin Champagne, OT

## 2023-02-24 NOTE — Progress Notes (Signed)
   02/24/23 0604  15 Minute Checks  Location Bedroom  Visual Appearance Calm  Behavior Sleeping  Sleep (Behavioral Health Patients Only)  Calculate sleep? (Click Yes once per 24 hr at 0600 safety check) Yes  Documented sleep last 24 hours 8.25

## 2023-02-24 NOTE — Group Note (Signed)
Recreation Therapy Group Note   Group Topic:Emotion Expression  Group Date: 02/24/2023 Start Time: 1007 End Time: 1045 Facilitators: Kenzey Birkland-McCall, LRT,CTRS Location: [address] Dayroom   Group Topic/Focus: Emotion Expression   Goal Area(s) Addresses:  Patient will be able to identify a variety of emotions.  Patient will use music as a way to express emotions.  Intervention: Music  Group Description: LRT explained the rules of the group to patients. Patients were given the opportunity to use music by choosing songs that were influential to them. Songs patients picked had to be clean and appropriate for group session. Patients could interact with the music as they chose.   Education: Communication, Discharge Planning  Educational Outcome: Acknowledges education   Affect/Mood: Appropriate   Participation Level: Moderate   Participation Quality: Independent   Behavior: Attentive    Speech/Thought Process: Focused   Insight: Fair   Judgement: Fair    Modes of Intervention: Music   Patient Response to Interventions:  Attentive   Education Outcome:  In group clarification offered    Clinical Observations/Individualized Feedback: Pt was appropriate during group. Pt sat attentive to the music for the most part. Pt eventually requested Irreplaceable by Edwyna Ready' and sang along with some of the lyrics.      Plan: Continue to engage patient in RT group sessions 2-3x/week.   Aayush Gelpi-McCall, LRT,CTRS  02/24/2023 12:11 PM

## 2023-02-24 NOTE — Plan of Care (Signed)
  Problem: Activity: Goal: Interest or engagement in activities will improve Outcome: Progressing

## 2023-02-25 ENCOUNTER — Encounter (HOSPITAL_COMMUNITY): Payer: Self-pay

## 2023-02-25 DIAGNOSIS — F200 Paranoid schizophrenia: Secondary | ICD-10-CM | POA: Diagnosis not present

## 2023-02-25 MED ORDER — WHITE PETROLATUM EX OINT
TOPICAL_OINTMENT | CUTANEOUS | Status: AC
  Administered 2023-02-25: 1 via TOPICAL
  Filled 2023-02-25: qty 5

## 2023-02-25 NOTE — Plan of Care (Signed)
  Problem: Education: Goal: Knowledge of [facility] Education information/materials will improve Outcome: Progressing   Problem: Activity: Goal: Interest or engagement in activities will improve Outcome: Progressing   Problem: Coping: Goal: Ability to verbalize frustrations and anger appropriately will improve Outcome: Progressing   Problem: Health Behavior/Discharge Planning: Goal: Identification of resources available to assist in meeting health care needs will improve Outcome: Progressing

## 2023-02-25 NOTE — Plan of Care (Signed)
   Problem: Coping: Goal: Ability to verbalize frustrations and anger appropriately will improve Outcome: Progressing   Problem: Safety: Goal: Periods of time without injury will increase Outcome: Progressing   Problem: Activity: Goal: Interest or engagement in activities will improve Outcome: Progressing

## 2023-02-25 NOTE — Plan of Care (Signed)
  Problem: Activity: Goal: Interest or engagement in activities will improve Outcome: Progressing   Problem: Safety: Goal: Periods of time without injury will increase Outcome: Progressing   

## 2023-02-25 NOTE — Progress Notes (Signed)
CSW received call from [facility] Lead, Darcus Pester [phone] to determine disposition. CSW shared pt will discharging home with mother. CSW shared that [facility] is a short term stabilization hospital and we will discharge pt. [facility] reported that they will speak with mother and follow up with CSW.

## 2023-02-25 NOTE — Progress Notes (Signed)
Patient ID: [name] Love, male   DOB: [birth year], 27 y.o.   MRN: [id number] [facility] MD Progress Note  02/25/2023 12:54 PM [name] Love  MRN:  [id number]  Subjective:   Pt is a 27 yo African American male with prior mental health diagnoses of P. Schizophrenia, Borderline Intellectual functioning, Cannabis use disorder who was transferred & admitted voluntarily to this [facility] on 12/02 after initially presenting to the [facility] on 11/29 with psychosis; Presented with disorganized thoughts, and making illogical statements, accompanied by his family members. As per documentation from the [facility]: "Pt states that he gets killed every time he states what's wrong. Pt states that his body talks to him, such as his butt. Pt denies SI, HI, AH and alcohol use at this present time. Pt endorses VH, stating that he sees letters and smoking synthetic herbs. Pt states that we can Ngu help him by providing him with liquids and blood work."     Per staff patient is much [place] it is still continues to be occasionally disorganized but without any agitation or grandiosity today.  Per [facility] patient is compliant with scheduled medications including Abilify Cogentin Depakote and Haldol, as needed Atarax for anxiety being used average twice daily.  No as needed medication for agitation or aggression needed or used.  Today's assessment notes: On assessment today, patient presents cooperative answering questions in a concrete manner occasionally disorganized for example when asking him about any medication side effect he responds "I just took my medicine it has not been cooked yet" he denies SI HI or AVH he reports good sleep and good appetite.  When asking him if he feels paranoid that people are trying to get him he responds "they do not want to talk to me" "the people here in the hospital" but he is unable to elaborate any further.  He does not appear sleepy and does not display any sign consistent with EPS or TD.   He tells me his  mother visited couple of days ago.   Principal Problem: Schizophrenia, paranoid type (HCC) Diagnosis: Principal Problem:   Schizophrenia, paranoid type (HCC) Active Problems:   Cannabis use disorder, severe, dependence (HCC)   Borderline intellectual functioning   Cocaine use disorder (HCC)  Total Time spent with patient: 35 minutes  Past Psychiatric History:  Previous Psych Diagnoses: P. Schizophrenia  Prior inpatient treatment: Multiple times at this hospital on the C/A unit: 10/26/2023, 11/25/2014, 01/30/2025, 08/07/2015, 08/17/2015, 09/28/2015, and on the adult unit at this hospital on 03/23/2016, 03/22/2017. Current/prior outpatient treatment: [facility] Mixer, [facility].  Prior rehab hx: Denies  Psychotherapy hx: [facility] History of suicide attempt: Denies  History of homicide or aggression: As per collateral information obtained by the [facility], pt has aggressive behaviors at home  Psychiatric medication history: Abilify LAI in the past, Ambien, Depakote, Cogentin, Haldol, Ambien in the past as per chart review. Psychiatric medication compliance history: non compliant as per chart review Neuromodulation history: Denies  Current Psychiatrist:[facility] Current therapist: [facility]   Past Medical History:  Past Medical History:  Diagnosis Date   Schizophrenia, acute (HCC)    History reviewed. No pertinent surgical history. Family History:  Family History  Problem Relation Age of Onset   Mental illness Cousin    Family Psychiatric  History: See H&P  Social History:  Social History   Substance and Sexual Activity  Alcohol Use Yes   Comment: a little      Social History   Substance and Sexual Activity  Drug Use No   Comment: denies    Social History   Socioeconomic History   Marital status: Single    Spouse name: Not on file   Number of children: Not on file   Years of education: Not on file   Highest education level: Not on file  Occupational History    Not on file  Tobacco Use   Smoking status: Every Day    Current packs/day: 0.00    Types: Cigarettes   Smokeless tobacco: Never  Substance and Sexual Activity   Alcohol use: Yes    Comment: a little    Drug use: No    Comment: denies   Sexual activity: Not Currently  Other Topics Concern   Not on file  Social History Narrative   Not on file   Social Drivers of Health   Financial Resource Strain: Not on file  Food Insecurity: Food Insecurity Present (02/14/2023)   Hunger Vital Sign    Worried About Running Out of Food in the Last Year: Sometimes true    Ran Out of Food in the Last Year: Sometimes true  Transportation Needs: Unmet Transportation Needs (02/14/2023)   PRAPARE - Administrator, Civil Service (Medical): Yes    Lack of Transportation (Non-Medical): Yes  Physical Activity: Not on file  Stress: Not on file  Social Connections: Not on file  Current Medications: Current Facility-Administered Medications  Medication Dose Route Frequency Provider Last Rate Last Admin   acetaminophen (TYLENOL) tablet 650 mg  650 mg Oral Q6H PRN Tomie China, MD   650 mg at 02/25/23 1144   alum & mag hydroxide-simeth (MAALOX/MYLANTA) 200-200-20 MG/5ML suspension 30 mL  30 mL Oral Q4H PRN Tomie China, MD       ARIPiprazole (ABILIFY) tablet 15 mg  15 mg Oral Daily Ntuen, Jesusita Oka, FNP   15 mg at 02/25/23 0810   benztropine (COGENTIN) tablet 1 mg  1 mg Oral BID Massengill, Harrold Donath, MD   1 mg at 02/25/23 9528   haloperidol (HALDOL) tablet 5 mg  5 mg Oral TID PRN Phineas Inches, MD       And   diphenhydrAMINE (BENADRYL) capsule 50 mg  50 mg Oral TID PRN Massengill, Harrold Donath, MD       haloperidol lactate (HALDOL) injection 5 mg  5 mg Intramuscular TID PRN Massengill, Harrold Donath, MD       And   diphenhydrAMINE (BENADRYL) injection 50 mg  50 mg Intramuscular TID PRN Massengill, Harrold Donath, MD       And   LORazepam (ATIVAN) injection 2 mg  2 mg Intramuscular TID PRN Massengill,  Harrold Donath, MD       haloperidol lactate (HALDOL) injection 10 mg  10 mg Intramuscular TID PRN Massengill, Harrold Donath, MD       And   diphenhydrAMINE (BENADRYL) injection 50 mg  50 mg Intramuscular TID PRN Massengill, Harrold Donath, MD       And   LORazepam (ATIVAN) injection 2 mg  2 mg Intramuscular TID PRN Massengill, Harrold Donath, MD       divalproex (DEPAKOTE ER) 24 hr tablet 750 mg  750 mg Oral Q12H Ntuen, Tina C, FNP   750 mg at 02/25/23 0810   haloperidol (HALDOL) 2 MG/ML solution 15 mg  15 mg Oral QHS Massengill, Harrold Donath, MD   15 mg at 02/24/23 2032   haloperidol (HALDOL) 2 MG/ML solution 5 mg  5 mg Oral Q0600 Massengill, Harrold Donath, MD   5 mg at 02/25/23 [phone]  hydrOXYzine (ATARAX) tablet 25 mg  25 mg Oral TID PRN Tomie China, MD   25 mg at 02/24/23 2035   magnesium hydroxide (MILK OF MAGNESIA) suspension 30 mL  30 mL Oral Daily PRN Tomie China, MD       traZODone (DESYREL) tablet 50 mg  50 mg Oral QHS PRN Tomie China, MD   50 mg at 02/24/23 2033   Vitamin D (Ergocalciferol) (DRISDOL) 1.25 MG (50000 UNIT) capsule 50,000 Units  50,000 Units Oral Q7 days Starleen Blue, NP   50,000 Units at 02/23/23 2025   white petrolatum (VASELINE) gel   Topical PRN Starleen Blue, NP   1 Application at 02/25/23 5621   Lab Results:  No results found for this or any previous visit (from the past 48 hours).  Blood Alcohol level:  Lab Results  Component Value Date   ETH <10 02/11/2023   ETH <10 02/06/2023   Metabolic Disorder Labs: Lab Results  Component Value Date   HGBA1C 5.3 02/11/2023   MPG 105.41 02/11/2023   MPG 117 11/27/2014   Lab Results  Component Value Date   PROLACTIN 57.5 (H) 08/20/2015   PROLACTIN 23.2 (H) 02/04/2015   Lab Results  Component Value Date   CHOL 202 (H) 02/11/2023   TRIG 46 02/11/2023   HDL 83 02/11/2023   CHOLHDL 2.4 02/11/2023   VLDL 9 02/11/2023   LDLCALC 110 (H) 02/11/2023   LDLCALC 152 (H) 07/15/2015    Physical Findings: AIMS: Facial and Oral  Movements Muscles of Facial Expression: None Lips and Perioral Area: None Jaw: None Tongue: None,Extremity Movements Upper (arms, wrists, hands, fingers): None Lower (legs, knees, ankles, toes): None, Trunk Movements Neck, shoulders, hips: None, Global Judgements Severity of abnormal movements overall : None Incapacitation due to abnormal movements: None Patient's awareness of abnormal movements: No Awareness, Dental Status Current problems with teeth and/or dentures?: No Does patient usually wear dentures?: No Edentia?: No  CIWA:    COWS:     + pill-rolling tremor left upper extremity at rest No other appreciable tremor, rigidity, or cogwheeling appreciated on exam on 12/8  Musculoskeletal: Strength & Muscle Tone: within normal limits Gait & Station: normal Patient leans: N/A  Psychiatric Specialty Exam:  Presentation  General Appearance:  Appropriate for Environment; Casual; Fairly Groomed  Eye Contact: Limited  Speech: Decreased amount but normal tone and volume  Speech Volume: Normal  Handedness: Right  Mood and Affect  Mood: Anxious; Depressed  Affect: Blunt  Thought Process  Thought Processes: Disorganized  Descriptions of Associations: Concrete Orientation:Partial  Thought Content: Disorganized at times  History of Schizophrenia/Schizoaffective disorder:Yes  Duration of Psychotic Symptoms:Greater than six months  Hallucinations:Hallucinations: None Description of Auditory Hallucinations: Denies hearing voices today  Ideas of Reference:None  Suicidal Thoughts:Suicidal Thoughts: No Denies passive or active SI intention or plan Homicidal Thoughts:Homicidal Thoughts: No Denies HI Sensorium  Memory: Immediate Poor; Recent Poor  Judgment: Poor  Insight: Poor  Executive Functions  Concentration: Fair  Attention Span: Poor  Recall: Poor  Fund of Knowledge: Poor  Language: Fair  Psychomotor Activity  Psychomotor  Activity: Psychomotor Activity: Normal  Assets  Assets: Resilience; Physical Health  Sleep  Sleep: Sleep: Good Number of Hours of Sleep: 8  Physical Exam: Physical Exam Vitals reviewed.  Constitutional:      General: He is not in acute distress.    Appearance: He is normal weight. He is not toxic-appearing.  HENT:     Mouth/Throat:     Mouth: Mucous membranes  are moist.  Cardiovascular:     Rate and Rhythm: Normal rate.     Pulses: Normal pulses.  Pulmonary:     Effort: Pulmonary effort is normal. No respiratory distress.  Abdominal:     Comments: Deferred  Genitourinary:    Comments: Deferred Musculoskeletal:        General: Normal range of motion.     Cervical back: Normal range of motion.  Neurological:     Mental Status: He is alert.     Motor: No weakness.     Gait: Gait normal.  Psychiatric:     Comments: Restless and tangential    Review of Systems  Constitutional:  Negative for chills and fever.  HENT:  Negative for sore throat.   Eyes:  Negative for blurred vision.  Respiratory:  Negative for cough, sputum production, shortness of breath and wheezing.   Cardiovascular:  Negative for chest pain and palpitations.  Gastrointestinal:  Negative for heartburn and nausea.  Genitourinary:  Negative for dysuria, frequency and urgency.  Musculoskeletal:  Negative for myalgias.  Neurological:  Negative for dizziness, tingling, tremors and headaches.  Endo/Heme/Allergies:        See allergy listing  Psychiatric/Behavioral:  Negative for depression, hallucinations, memory loss, substance abuse and suicidal ideas. The patient is nervous/anxious. The patient does not have insomnia.   All other systems reviewed and are negative.  Blood pressure 139/86, pulse 87, temperature 98.2 F (36.8 C), temperature source Oral, resp. rate 20, height 5\' 8"  (1.727 m), weight 74 kg, SpO2 100%. Body mass index is 24.81 kg/m.  Treatment Plan Summary: Daily contact with patient to  assess and evaluate symptoms and progress in treatment and Medication management  ASSESSMENT:  Diagnoses / Active Problems: Schizophrenia, paranoid type (HCC)   Cannabis use disorder, severe, dependence (HCC)   Borderline intellectual functioning   Cocaine use disorder (HCC)  PLAN: Safety and Monitoring:  --  Voluntary admission to inpatient psychiatric unit for safety, stabilization and treatment  -- Daily contact with patient to assess and evaluate symptoms and progress in treatment  -- Patient's case to be discussed in multi-disciplinary team meeting  -- Observation Level : q15 minute checks  -- Vital signs:  q12 hours  -- Precautions: suicide, elopement, and assault  2. Psychiatric Diagnoses and Treatment:   Continue Depakote 750 mg twice daily   Continue liquid Haldol 5 mg in the morning and 15 mg at bedtime for schizophrenia   Continue Cogentin 1 mg twice for pill-rolling tremor.    Continue Abilify 15 mg p.o. once daily for psychosis .  Patient psychosis have been refractory requiring using 2 antipsychotics with positive response noted.   Continue vitamin D supplementation  Continue hydroxyzine as needed for anxiety and trazodone as needed and insomnia  --  The risks/benefits/side-effects/alternatives to this medication were discussed in detail with the patient and time was given for questions. The patient consents to medication trial.    3. Medical Issues Being Addressed: None at this time   4. Discharge Planning:   -- Social work and case management to assist with discharge planning and identification of hospital follow-up needs prior to discharge  -- Estimated LOS: 5-7 more days  -- Discharge Concerns: Need to establish a safety plan; Medication compliance and effectiveness  -- Discharge Goals: Return home with outpatient referrals for mental health follow-up including medication management/psychotherapy   Total Time Spent in Direct Patient Care:  I personally  spent 35 minutes on the unit in direct patient  care. The direct patient care time included face-to-face time with the patient, reviewing the patient's chart, communicating with other professionals, and coordinating care. Greater than 50% of this time was spent in counseling or coordinating care with the patient regarding goals of hospitalization, psycho-education, and discharge planning needs.  Sarita Bottom, MD 02/25/2023, 12:54 PM  Patient ID: [name] Love, male   DOB: June 29, 1995, 29 y.o.   MRN: [id number] Patient ID: [name] Love, male   DOB: 1995-06-28, 27 y.o.   MRN: [id number] Patient ID: [name] Love, male   DOB: [birth year], 27 y.o.   MRN: [id number]

## 2023-02-25 NOTE — BH IP Treatment Plan (Signed)
Interdisciplinary Treatment and Diagnostic Plan Update  02/25/2023 Time of Session: 3:30 pm- [name] Love MRN: [id number]  Principal Diagnosis: Schizophrenia, paranoid type (HCC)  Secondary Diagnoses: Principal Problem:   Schizophrenia, paranoid type (HCC) Active Problems:   Cannabis use disorder, severe, dependence (HCC)   Borderline intellectual functioning   Cocaine use disorder (HCC)   Current Medications:  Current Facility-Administered Medications  Medication Dose Route Frequency Provider Last Rate Last Admin   acetaminophen (TYLENOL) tablet 650 mg  650 mg Oral Q6H PRN Tomie China, MD   650 mg at 02/25/23 1144   alum & mag hydroxide-simeth (MAALOX/MYLANTA) 200-200-20 MG/5ML suspension 30 mL  30 mL Oral Q4H PRN Tomie China, MD       ARIPiprazole (ABILIFY) tablet 15 mg  15 mg Oral Daily Ntuen, Jesusita Oka, FNP   15 mg at 02/25/23 0810   benztropine (COGENTIN) tablet 1 mg  1 mg Oral BID Massengill, Harrold Donath, MD   1 mg at 02/25/23 4098   haloperidol (HALDOL) tablet 5 mg  5 mg Oral TID PRN Phineas Inches, MD       And   diphenhydrAMINE (BENADRYL) capsule 50 mg  50 mg Oral TID PRN Massengill, Harrold Donath, MD       haloperidol lactate (HALDOL) injection 5 mg  5 mg Intramuscular TID PRN Massengill, Harrold Donath, MD       And   diphenhydrAMINE (BENADRYL) injection 50 mg  50 mg Intramuscular TID PRN Massengill, Harrold Donath, MD       And   LORazepam (ATIVAN) injection 2 mg  2 mg Intramuscular TID PRN Massengill, Harrold Donath, MD       haloperidol lactate (HALDOL) injection 10 mg  10 mg Intramuscular TID PRN Massengill, Harrold Donath, MD       And   diphenhydrAMINE (BENADRYL) injection 50 mg  50 mg Intramuscular TID PRN Massengill, Harrold Donath, MD       And   LORazepam (ATIVAN) injection 2 mg  2 mg Intramuscular TID PRN Massengill, Harrold Donath, MD       divalproex (DEPAKOTE ER) 24 hr tablet 750 mg  750 mg Oral Q12H Ntuen, Tina C, FNP   750 mg at 02/25/23 0810   haloperidol (HALDOL) 2 MG/ML solution 15 mg  15  mg Oral QHS Massengill, Harrold Donath, MD   15 mg at 02/24/23 2032   haloperidol (HALDOL) 2 MG/ML solution 5 mg  5 mg Oral Q0600 Massengill, Harrold Donath, MD   5 mg at 02/25/23 0809   hydrOXYzine (ATARAX) tablet 25 mg  25 mg Oral TID PRN Tomie China, MD   25 mg at 02/24/23 2035   magnesium hydroxide (MILK OF MAGNESIA) suspension 30 mL  30 mL Oral Daily PRN Tomie China, MD       traZODone (DESYREL) tablet 50 mg  50 mg Oral QHS PRN Tomie China, MD   50 mg at 02/24/23 2033   Vitamin D (Ergocalciferol) (DRISDOL) 1.25 MG (50000 UNIT) capsule 50,000 Units  50,000 Units Oral Q7 days Starleen Blue, NP   50,000 Units at 02/23/23 2025   white petrolatum (VASELINE) gel   Topical PRN Starleen Blue, NP   1 Application at 02/25/23 1191   PTA Medications: Medications Prior to Admission  Medication Sig Dispense Refill Last Dose/Taking   divalproex (DEPAKOTE) 500 MG DR tablet Take 1 tablet (500 mg total) by mouth at bedtime.       Patient Stressors:    Patient Strengths: Supportive family/friends   Treatment Modalities: Medication Management, Group therapy, Case management,  1 to 1  session with clinician, Psychoeducation, Recreational therapy.   Physician Treatment Plan for Primary Diagnosis: Schizophrenia, paranoid type (HCC) Long Term Goal(s): Improvement in symptoms so as ready for discharge   Short Term Goals: Ability to identify changes in lifestyle to reduce recurrence of condition will improve Ability to verbalize feelings will improve Ability to disclose and discuss suicidal ideas Ability to demonstrate self-control will improve Ability to identify and develop effective coping behaviors will improve Ability to maintain clinical measurements within normal limits will improve Compliance with prescribed medications will improve Ability to identify triggers associated with substance abuse/mental health issues will improve  Medication Management: Evaluate patient's response, side  effects, and tolerance of medication regimen.  Therapeutic Interventions: 1 to 1 sessions, Unit Group sessions and Medication administration.  Evaluation of Outcomes: Not Progressing  Physician Treatment Plan for Secondary Diagnosis: Principal Problem:   Schizophrenia, paranoid type (HCC) Active Problems:   Cannabis use disorder, severe, dependence (HCC)   Borderline intellectual functioning   Cocaine use disorder (HCC)  Long Term Goal(s): Improvement in symptoms so as ready for discharge   Short Term Goals: Ability to identify changes in lifestyle to reduce recurrence of condition will improve Ability to verbalize feelings will improve Ability to disclose and discuss suicidal ideas Ability to demonstrate self-control will improve Ability to identify and develop effective coping behaviors will improve Ability to maintain clinical measurements within normal limits will improve Compliance with prescribed medications will improve Ability to identify triggers associated with substance abuse/mental health issues will improve     Medication Management: Evaluate patient's response, side effects, and tolerance of medication regimen.  Therapeutic Interventions: 1 to 1 sessions, Unit Group sessions and Medication administration.  Evaluation of Outcomes: Not Progressing   RN Treatment Plan for Primary Diagnosis: Schizophrenia, paranoid type (HCC) Long Term Goal(s): Knowledge of disease and therapeutic regimen to maintain health will improve  Short Term Goals: Ability to remain free from injury will improve, Ability to verbalize frustration and anger appropriately will improve, Ability to demonstrate self-control, Ability to disclose and discuss suicidal ideas, and Ability to identify and develop effective coping behaviors will improve  Medication Management: RN will administer medications as ordered by provider, will assess and evaluate patient's response and provide education to patient for  prescribed medication. RN will report any adverse and/or side effects to prescribing provider.  Therapeutic Interventions: 1 on 1 counseling sessions, Psychoeducation, Medication administration, Evaluate responses to treatment, Monitor vital signs and CBGs as ordered, Perform/monitor CIWA, COWS, AIMS and Fall Risk screenings as ordered, Perform wound care treatments as ordered.  Evaluation of Outcomes: Progressing   LCSW Treatment Plan for Primary Diagnosis: Schizophrenia, paranoid type (HCC) Long Term Goal(s): Safe transition to appropriate next level of care at discharge, Engage patient in therapeutic group addressing interpersonal concerns.  Short Term Goals: Engage patient in aftercare planning with referrals and resources, Increase social support, Increase emotional regulation, Facilitate patient progression through stages of change regarding substance use diagnoses and concerns, and Increase skills for wellness and recovery  Therapeutic Interventions: Assess for all discharge needs, 1 to 1 time with Social worker, Explore available resources and support systems, Assess for adequacy in community support network, Educate family and significant other(s) on suicide prevention, Complete Psychosocial Assessment, Interpersonal group therapy.  Evaluation of Outcomes: Not Progressing   Progress in Treatment: Attending groups: Yes. Participating in groups: Yes. Taking medication as prescribed: Yes. Toleration medication: Yes. Family/Significant other contact made: Yes, individual(s) contacted:  [name], mother [phone] Patient understands diagnosis: Yes.  Discussing patient identified problems/goals with staff: Yes. Medical problems stabilized or resolved: Yes. Denies suicidal/homicidal ideation: Yes. Issues/concerns per patient self-inventory: No. Other: na   New problem(s) identified: No, Describe:  none reported   New Short Term/Long Term Goal(s): medication stabilization,  elimination of SI thoughts, development of comprehensive mental wellness plan.      Patient Goals:  " I would like to stay here for a long time and improve my behavior- I need a safe a place for a long time"   Discharge Plan or Barriers:  Patient recently admitted. CSW will continue to follow and assess for appropriate referrals and possible discharge planning.      Reason for Continuation of Hospitalization: Aggression Delusions  Hallucinations Suicidal ideation   Estimated Length of Stay: 5-7 days  Last 3 [place] Suicide Severity Risk Score: Flowsheet Row [facility] (Current[facility]12[facility]20[facility] from [facility]11/2023 in [facility]  C-SSRS RISK CATEGORY No Risk No Risk No Risk       Last [facility] 2/9 Scores:     No data to display          Scribe for Treatment Team: Lauren, [place] 02/25/2023 4:05 PM

## 2023-02-25 NOTE — Progress Notes (Signed)
   02/25/23 2024  Psych Admission Type (Psych Patients Only)  Admission Status Voluntary  Psychosocial Assessment  Patient Complaints None  Eye Contact Fair  Facial Expression Flat  Affect Preoccupied  Speech Logical/coherent  Interaction Assertive  Motor Activity Slow  Appearance/Hygiene Unremarkable  Behavior Characteristics Cooperative  Mood Anxious;Preoccupied  Thought Process  Coherency Loose associations  Content Preoccupation  Delusions Paranoid  Perception Hallucinations  Hallucination Auditory  Judgment Impaired  Confusion None  Danger to Self  Current suicidal ideation? Denies  Self-Injurious Behavior No self-injurious ideation or behavior indicators observed or expressed   Agreement Not to Harm Self Yes  Description of Agreement verbal  Danger to Others  Danger to Others None reported or observed

## 2023-02-25 NOTE — Group Note (Signed)
Recreation Therapy Group Note   Group Topic:Anger Management  Group Date: 02/25/2023 Start Time: 1035 End Time: 1100 Facilitators: Kei Langhorst-McCall, LRT,CTRS Location: [address] Dayroom   Group Topic: Anger Management   Goal Area(s) Addresses:  Patient will identify emotions anger covers.  Patient will identify coping skills to deal with anger.   Intervention: Conversation with group, and worksheets   Group Description: Patient discussed anger, what makes them angry, and what other emotions come with anger. Patients identified what anger and the way they specifically react when angry. Patients were given an anger umbrella worksheet where they had to identify the emotions they feel when angry and write them inside of the umbrella. Patients then identified coping skills used to deal with anger and write them on the outside of the umbrella.      Education: Anger Management, Discharge Planning    Education Outcome: Acknowledges education/In group clarification offered/Needs additional education.    Affect/Mood: Appropriate   Participation Level: Moderate   Participation Quality: Independent   Behavior: Cooperative   Speech/Thought Process: Delusional and Irrational   Insight: Lacking   Judgement: Lacking    Modes of Intervention: Worksheet   Patient Response to Interventions:  Receptive   Education Outcome:  In group clarification offered    Clinical Observations/Individualized Feedback: Pt didn't understand the concept of the activity. Pt provided random answers emotions of anger and coping skills. Pt gave answers such as eat slow, mentors, burger, pizza, chips, snickers, braids, locks, nacho cheese and king.     Plan: Continue to engage patient in RT group sessions 2-3x/week.   Vinay Ertl-McCall, LRT,CTRS  02/25/2023 12:56 PM

## 2023-02-25 NOTE — Progress Notes (Signed)
Patient is compliant, and corporative with routine regimen. Patient denies SI/HI/AVH, patient presents with thought blocking, and flat affect. Patient seems to be confused at time, he comes to the window and just stares without saying anything. When nurse asks what he wants, patient states "I don't even know what I want." Patient took all scheduled medication with no complaint and no side effect noted. No acute distress noted at this time. Safety observation in place. Staff will continue to provide support to patient.   02/25/23 0800  Psych Admission Type (Psych Patients Only)  Admission Status Voluntary  Psychosocial Assessment  Patient Complaints None  Eye Contact Fair  Facial Expression Flat  Affect Preoccupied;Appropriate to circumstance  Speech Logical/coherent  Interaction Minimal  Motor Activity Slow  Appearance/Hygiene Unremarkable  Behavior Characteristics Cooperative;Appropriate to situation  Mood Anxious;Preoccupied  Thought Process  Coherency Loose associations  Content Preoccupation  Delusions Paranoid  Perception Hallucinations  Hallucination Auditory  Judgment Impaired  Confusion None  Danger to Self  Current suicidal ideation? Denies  Self-Injurious Behavior No self-injurious ideation or behavior indicators observed or expressed   Agreement Not to Harm Self Yes  Description of Agreement Verbal  Danger to Others  Danger to Others None reported or observed

## 2023-02-25 NOTE — BHH Group Notes (Signed)
Spirituality group facilitated by Kathleen Argue, BCC.  Group Description: Group focused on topic of hope. Patients participated in facilitated discussion around topic, connecting with one another around experiences and definitions for hope. Group members engaged with visual explorer photos, reflecting on what hope looks like for them today. Group engaged in discussion around how their definitions of hope are present today in hospital.  Modalities: Psycho-social ed, Adlerian, Narrative, MI  Patient Progress: Orell attended group and engaged in group conversation when called upon.  Some of his comments were confusing to follow, but others were on topic and appropriate. He was in good spirits and contributed positively to the group conversation.

## 2023-02-26 DIAGNOSIS — F200 Paranoid schizophrenia: Secondary | ICD-10-CM | POA: Diagnosis not present

## 2023-02-26 NOTE — Progress Notes (Signed)
   02/26/23 0600  15 Minute Checks  Location Bedroom  Visual Appearance Calm  Behavior Sleeping  Sleep (Behavioral Health Patients Only)  Calculate sleep? (Click Yes once per 24 hr at 0600 safety check) Yes  Documented sleep last 24 hours 8

## 2023-02-26 NOTE — Progress Notes (Signed)
   02/26/23 2020  Psych Admission Type (Psych Patients Only)  Admission Status Voluntary  Psychosocial Assessment  Patient Complaints None  Eye Contact Fair  Facial Expression Flat  Affect Preoccupied  Speech Tangential;Soft  Interaction Assertive  Motor Activity Slow  Appearance/Hygiene Unremarkable  Behavior Characteristics Cooperative  Mood Anxious;Preoccupied  Thought Process  Coherency Loose associations  Content Preoccupation  Delusions Paranoid  Perception Hallucinations  Hallucination Visual  Judgment Impaired  Confusion None  Danger to Self  Current suicidal ideation? Denies  Self-Injurious Behavior No self-injurious ideation or behavior indicators observed or expressed   Agreement Not to Harm Self Yes  Description of Agreement verbal  Danger to Others  Danger to Others None reported or observed

## 2023-02-26 NOTE — BHH Group Notes (Signed)
Adult Psychoeducational Group Note  Date:  02/26/2023 Time:  10:22 AM  Group Topic/Focus:  Goals Group:   The focus of this group is to help patients establish daily goals to achieve during treatment and discuss how the patient can incorporate goal setting into their daily lives to aide in recovery. Orientation:   The focus of this group is to educate the patient on the purpose and policies of crisis stabilization and provide a format to answer questions about their admission.  The group details unit policies and expectations of patients while admitted.  Participation Level:  Active  Participation Quality:  Appropriate  Affect:  Appropriate  Cognitive:  Confused  Insight: Limited  Engagement in Group:  Engaged  Modes of Intervention:  Discussion  Additional Comments:  Pt attended the goals group and remained appropriate and engaged throughout the duration of the group.   Fara Olden O 02/26/2023, 10:22 AM

## 2023-02-26 NOTE — BHH Group Notes (Signed)
Adult Psychoeducational Group Note  Date:  02/26/2023 Time:  8:52 PM  Group Topic/Focus:  Wrap-Up Group:   The focus of this group is to help patients review their daily goal of treatment and discuss progress on daily workbooks.  Participation Level:  Minimal  Participation Quality:  Appropriate  Affect:  Appropriate  Cognitive:  Confused  Insight: Good  Engagement in Group:  Engaged  Modes of Intervention:  Discussion  Additional Comments:  Patient had a 3 out of 10 day.  Patient was easily distracted and was confused at times when explaining how his day went  [facility] 02/26/2023, 8:52 PM

## 2023-02-26 NOTE — Progress Notes (Addendum)
Patient pleasant and cooperative in the milieu throughout the shift. Patient continues to make illogical statements and is disorganized. Patient rates depression 5/10, hopelessness 6/10 and anxiety 5/10. Patient denies SI, HI and says "not today" with respect to AVH. Patient is adherent to all medications. No adverse effects noted. Patient remains safe Q 15 minute safety checks ongoing.   02/26/23 0800  Psych Admission Type (Psych Patients Only)  Admission Status Voluntary  Psychosocial Assessment  Patient Complaints None  Eye Contact Fair  Facial Expression Flat  Affect Preoccupied  Speech Soft;Tangential  Interaction Assertive  Motor Activity Slow  Appearance/Hygiene Unremarkable  Behavior Characteristics Cooperative;Calm  Mood Apprehensive;Preoccupied  Thought Process  Coherency Loose associations  Content Preoccupation  Delusions Paranoid  Perception Hallucinations  Hallucination Auditory  Judgment Impaired  Confusion None  Danger to Self  Current suicidal ideation? Denies  Self-Injurious Behavior No self-injurious ideation or behavior indicators observed or expressed   Agreement Not to Harm Self Yes  Description of Agreement verbal  Danger to Others  Danger to Others None reported or observed

## 2023-02-26 NOTE — Progress Notes (Signed)
Patient ID: [name] Love, male   DOB: 1995/08/11, 27 y.o.   MRN: [id number] [facility] MD Progress Note  02/26/2023 10:32 AM [name] Love  MRN:  [id number]  Subjective:   Pt is a 27 yo African American male with prior mental health diagnoses of P. Schizophrenia, Borderline Intellectual functioning, Cannabis use disorder who was transferred & admitted voluntarily to this [facility] on 12/02 after initially presenting to the [facility] on 11/29 with psychosis; Presented with disorganized thoughts, and making illogical statements, accompanied by his family members. As per documentation from the [facility]: "Pt states that he gets killed every time he states what's wrong. Pt states that his body talks to him, such as his butt. Pt denies SI, HI, AH and alcohol use at this present time. Pt endorses VH, stating that he sees letters and smoking synthetic herbs. Pt states that we can [place] help him by providing him with liquids and blood work."     Per staff patient is much [place] it is still continues to be occasionally disorganized but without any agitation or grandiosity today, was reported per staff to be occasionally paranoid.  Per [facility] patient is compliant with scheduled medications including Abilify Cogentin Depakote and Haldol, as needed Atarax for anxiety being used average twice daily.  No as needed medication for agitation or aggression needed or used.  Today's assessment notes: On assessment today, patient presents cooperative answering questions in a concrete manner with some disorganized thought process noted nonlinear at times for example when I asked him how is his day he responds "warm my eyes are good" he denies passive or active SI intention or plan "I love myself" he does admit to some paranoia noting that some people are following him but unable to elaborate any further he denies HI or AVH and does not present responding to stimuli, he denies side effect of medications and agrees to comply, he reports itching with  unknown source, I discussed with him using Vistaril as needed, will follow.  Later during evaluation he goes on again in a disorganized manner "I had a sandwich I do not know if it was make up or minding that is making the itch" unable to elaborate any details.  Principal Problem: Schizophrenia, paranoid type (HCC) Diagnosis: Principal Problem:   Schizophrenia, paranoid type (HCC) Active Problems:   Cannabis use disorder, severe, dependence (HCC)   Borderline intellectual functioning   Cocaine use disorder (HCC)  Total Time spent with patient: 35 minutes  Past Psychiatric History:  Previous Psych Diagnoses: P. Schizophrenia  Prior inpatient treatment: Multiple times at this hospital on the C/A unit: 10/26/2023, 11/25/2014, 01/30/2025, 08/07/2015, 08/17/2015, 09/28/2015, and on the adult unit at this hospital on 03/23/2016, 03/22/2017. Current/prior outpatient treatment: [facility], [facility].  Prior rehab hx: Denies  Psychotherapy hx: [facility] History of suicide attempt: Denies  History of homicide or aggression: As per collateral information obtained by the [facility], pt has aggressive behaviors at home  Psychiatric medication history: Abilify LAI in the past, Ambien, Depakote, Cogentin, Haldol, Ambien in the past as per chart review. Psychiatric medication compliance history: non compliant as per chart review Neuromodulation history: Denies  Current Psychiatrist:[facility] Current therapist: [facility]   Past Medical History:  Past Medical History:  Diagnosis Date   Schizophrenia, acute (HCC)    History reviewed. No pertinent surgical history. Family History:  Family History  Problem Relation Age of Onset   Mental illness Cousin    Family Psychiatric  History: See H&P  Social  History:  Social History   Substance and Sexual Activity  Alcohol Use Yes   Comment: a little      Social History   Substance and Sexual Activity  Drug Use No   Comment: denies    Social  History   Socioeconomic History   Marital status: Single    Spouse name: Not on file   Number of children: Not on file   Years of education: Not on file   Highest education level: Not on file  Occupational History   Not on file  Tobacco Use   Smoking status: Every Day    Current packs/day: 0.00    Types: Cigarettes   Smokeless tobacco: Never  Substance and Sexual Activity   Alcohol use: Yes    Comment: a little    Drug use: No    Comment: denies   Sexual activity: Not Currently  Other Topics Concern   Not on file  Social History Narrative   Not on file   Social Drivers of Health   Financial Resource Strain: Not on file  Food Insecurity: Food Insecurity Present (02/14/2023)   Hunger Vital Sign    Worried About Running Out of Food in the Last Year: Sometimes true    Ran Out of Food in the Last Year: Sometimes true  Transportation Needs: Unmet Transportation Needs (02/14/2023)   PRAPARE - Administrator, Civil Service (Medical): Yes    Lack of Transportation (Non-Medical): Yes  Physical Activity: Not on file  Stress: Not on file  Social Connections: Not on file  Current Medications: Current Facility-Administered Medications  Medication Dose Route Frequency Provider Last Rate Last Admin   acetaminophen (TYLENOL) tablet 650 mg  650 mg Oral Q6H PRN Tomie China, MD   650 mg at 02/25/23 1144   alum & mag hydroxide-simeth (MAALOX/MYLANTA) 200-200-20 MG/5ML suspension 30 mL  30 mL Oral Q4H PRN Tomie China, MD       ARIPiprazole (ABILIFY) tablet 15 mg  15 mg Oral Daily Ntuen, Jesusita Oka, FNP   15 mg at 02/26/23 0745   benztropine (COGENTIN) tablet 1 mg  1 mg Oral BID Massengill, Harrold Donath, MD   1 mg at 02/26/23 0745   haloperidol (HALDOL) tablet 5 mg  5 mg Oral TID PRN Phineas Inches, MD       And   diphenhydrAMINE (BENADRYL) capsule 50 mg  50 mg Oral TID PRN Massengill, Harrold Donath, MD       haloperidol lactate (HALDOL) injection 5 mg  5 mg Intramuscular TID PRN  Massengill, Harrold Donath, MD       And   diphenhydrAMINE (BENADRYL) injection 50 mg  50 mg Intramuscular TID PRN Massengill, Harrold Donath, MD       And   LORazepam (ATIVAN) injection 2 mg  2 mg Intramuscular TID PRN Massengill, Harrold Donath, MD       haloperidol lactate (HALDOL) injection 10 mg  10 mg Intramuscular TID PRN Massengill, Harrold Donath, MD       And   diphenhydrAMINE (BENADRYL) injection 50 mg  50 mg Intramuscular TID PRN Massengill, Harrold Donath, MD       And   LORazepam (ATIVAN) injection 2 mg  2 mg Intramuscular TID PRN Massengill, Harrold Donath, MD       divalproex (DEPAKOTE ER) 24 hr tablet 750 mg  750 mg Oral Q12H Ntuen, Tina C, FNP   750 mg at 02/26/23 0745   haloperidol (HALDOL) 2 MG/ML solution 15 mg  15 mg Oral QHS Phineas Inches, MD  15 mg at 02/25/23 2025   haloperidol (HALDOL) 2 MG/ML solution 5 mg  5 mg Oral Q0600 Massengill, Harrold Donath, MD   5 mg at 02/26/23 0747   hydrOXYzine (ATARAX) tablet 25 mg  25 mg Oral TID PRN Tomie China, MD   25 mg at 02/25/23 2024   magnesium hydroxide (MILK OF MAGNESIA) suspension 30 mL  30 mL Oral Daily PRN Tomie China, MD       traZODone (DESYREL) tablet 50 mg  50 mg Oral QHS PRN Tomie China, MD   50 mg at 02/25/23 2024   Vitamin D (Ergocalciferol) (DRISDOL) 1.25 MG (50000 UNIT) capsule 50,000 Units  50,000 Units Oral Q7 days Starleen Blue, NP   50,000 Units at 02/23/23 2025   white petrolatum (VASELINE) gel   Topical PRN Starleen Blue, NP   1 Application at 02/25/23 5409   Lab Results:  No results found for this or any previous visit (from the past 48 hours).  Blood Alcohol level:  Lab Results  Component Value Date   ETH <10 02/11/2023   ETH <10 02/06/2023   Metabolic Disorder Labs: Lab Results  Component Value Date   HGBA1C 5.3 02/11/2023   MPG 105.41 02/11/2023   MPG 117 11/27/2014   Lab Results  Component Value Date   PROLACTIN 57.5 (H) 08/20/2015   PROLACTIN 23.2 (H) 02/04/2015   Lab Results  Component Value Date   CHOL 202  (H) 02/11/2023   TRIG 46 02/11/2023   HDL 83 02/11/2023   CHOLHDL 2.4 02/11/2023   VLDL 9 02/11/2023   LDLCALC 110 (H) 02/11/2023   LDLCALC 152 (H) 07/15/2015    Physical Findings: AIMS: Facial and Oral Movements Muscles of Facial Expression: None Lips and Perioral Area: None Jaw: None Tongue: None,Extremity Movements Upper (arms, wrists, hands, fingers): None Lower (legs, knees, ankles, toes): None, Trunk Movements Neck, shoulders, hips: None, Global Judgements Severity of abnormal movements overall : None Incapacitation due to abnormal movements: None Patient's awareness of abnormal movements: No Awareness, Dental Status Current problems with teeth and/or dentures?: No Does patient usually wear dentures?: No Edentia?: No  CIWA:    COWS:     + pill-rolling tremor left upper extremity at rest No other appreciable tremor, rigidity, or cogwheeling appreciated on exam on 12/8  Musculoskeletal: Strength & Muscle Tone: within normal limits Gait & Station: normal Patient leans: N/A  Psychiatric Specialty Exam:  Presentation  General Appearance:  Appropriate for Environment; Casual; Fairly Groomed  Eye Contact: Limited  Speech: Decreased amount but normal tone and volume  Speech Volume: Normal  Handedness: Right  Mood and Affect  Mood: Anxious; Depressed  Affect: Blunt  Thought Process  Thought Processes: Disorganized  Descriptions of Associations: Concrete Orientation:Partial  Thought Content: Disorganized at times  History of Schizophrenia/Schizoaffective disorder:Yes  Duration of Psychotic Symptoms:Greater than six months  Hallucinations:No data recorded  Ideas of Reference:None  Suicidal Thoughts:No data recorded Denies passive or active SI intention or plan Homicidal Thoughts:No data recorded Denies HI Sensorium  Memory: Immediate Poor; Recent Poor  Judgment: Poor  Insight: Poor  Executive Functions   Concentration: Fair  Attention Span: Poor  Recall: Poor  Fund of Knowledge: Poor  Language: Fair  Psychomotor Activity  Psychomotor Activity: No data recorded  Assets  Assets: Resilience; Physical Health  Sleep  Sleep: No data recorded  Physical Exam: Physical Exam Vitals and nursing note reviewed.  Constitutional:      General: He is not in acute distress.    Appearance:  He is normal weight. He is not toxic-appearing.  HENT:     Mouth/Throat:     Mouth: Mucous membranes are moist.  Cardiovascular:     Rate and Rhythm: Normal rate.     Pulses: Normal pulses.  Pulmonary:     Effort: Pulmonary effort is normal. No respiratory distress.  Abdominal:     Comments: Deferred  Genitourinary:    Comments: Deferred Musculoskeletal:        General: Normal range of motion.     Cervical back: Normal range of motion.  Neurological:     Mental Status: He is alert.     Motor: No weakness.     Gait: Gait normal.  Psychiatric:     Comments: Restless and tangential    Review of Systems  Constitutional:  Negative for chills and fever.  HENT:  Negative for sore throat.   Eyes:  Negative for blurred vision.  Respiratory:  Negative for cough, sputum production, shortness of breath and wheezing.   Cardiovascular:  Negative for chest pain and palpitations.  Gastrointestinal:  Negative for heartburn and nausea.  Genitourinary:  Negative for dysuria, frequency and urgency.  Musculoskeletal:  Negative for myalgias.  Neurological:  Negative for dizziness, tingling, tremors and headaches.  Endo/Heme/Allergies:        See allergy listing  Psychiatric/Behavioral:  Negative for depression, hallucinations, memory loss, substance abuse and suicidal ideas. The patient does not have insomnia.   All other systems reviewed and are negative.  Blood pressure 129/87, pulse 89, temperature 98.5 F (36.9 C), temperature source Oral, resp. rate 20, height 5\' 8"  (1.727 m), weight 74 kg,  SpO2 100%. Body mass index is 24.81 kg/m.  Treatment Plan Summary: Daily contact with patient to assess and evaluate symptoms and progress in treatment and Medication management  ASSESSMENT:  Diagnoses / Active Problems: Schizophrenia, paranoid type (HCC)   Cannabis use disorder, severe, dependence (HCC)   Borderline intellectual functioning   Cocaine use disorder (HCC)  PLAN: Safety and Monitoring:  --  Voluntary admission to inpatient psychiatric unit for safety, stabilization and treatment  -- Daily contact with patient to assess and evaluate symptoms and progress in treatment  -- Patient's case to be discussed in multi-disciplinary team meeting  -- Observation Level : q15 minute checks  -- Vital signs:  q12 hours  -- Precautions: suicide, elopement, and assault  2. Psychiatric Diagnoses and Treatment:   Continue Depakote 750 mg twice daily   Continue liquid Haldol 5 mg in the morning and 15 mg at bedtime for schizophrenia   Continue Cogentin 1 mg twice for pill-rolling tremor.    Continue Abilify 15 mg p.o. once daily for psychosis.  Patient psychosis have been refractory requiring using 2 antipsychotics with positive response noted.   Continue vitamin D supplementation  Continue hydroxyzine as needed for anxiety and trazodone as needed and insomnia  --  The risks/benefits/side-effects/alternatives to this medication were discussed in detail with the patient and time was given for questions. The patient consents to medication trial.    3. Medical Issues Being Addressed: None at this time   4. Discharge Planning:   -- Social work and case management to assist with discharge planning and identification of hospital follow-up needs prior to discharge  -- Estimated LOS: 5-7 more days  -- Discharge Concerns: Need to establish a safety plan; Medication compliance and effectiveness  -- Discharge Goals: Return home with outpatient referrals for mental health follow-up  including medication management/psychotherapy   Total Time  Spent in Direct Patient Care:  I personally spent 35 minutes on the unit in direct patient care. The direct patient care time included face-to-face time with the patient, reviewing the patient's chart, communicating with other professionals, and coordinating care. Greater than 50% of this time was spent in counseling or coordinating care with the patient regarding goals of hospitalization, psycho-education, and discharge planning needs.  Sarita Bottom, MD 02/26/2023, 10:32 AM  Patient ID: [name] Love, male   DOB: December 16, 1995, 27 y.o.   MRN: [id number] Patient ID: [name] Love, male   DOB: September 13, 1995, 27 y.o.   MRN: [id number] Patient ID: [name] Love, male   DOB: 30-Mar-1995, 27 y.o.   MRN: [id number]

## 2023-02-26 NOTE — Plan of Care (Signed)
  Problem: Safety: Goal: Periods of time without injury will increase Outcome: Progressing   

## 2023-02-26 NOTE — Plan of Care (Signed)
  Problem: Education: Goal: Emotional status will improve Outcome: Progressing Goal: Mental status will improve Outcome: Progressing   Problem: Activity: Goal: Interest or engagement in activities will improve Outcome: Progressing   Problem: Coping: Goal: Ability to demonstrate self-control will improve Outcome: Progressing   Problem: Physical Regulation: Goal: Ability to maintain clinical measurements within normal limits will improve Outcome: Progressing

## 2023-02-26 NOTE — Progress Notes (Signed)
   02/26/23 0800  Psych Admission Type (Psych Patients Only)  Admission Status Voluntary  Psychosocial Assessment  Patient Complaints None  Eye Contact Fair  Facial Expression Flat  Affect Preoccupied  Speech Logical/coherent  Interaction Assertive  Motor Activity Slow  Appearance/Hygiene Unremarkable  Behavior Characteristics Cooperative;Calm  Mood Apprehensive;Preoccupied  Thought Process  Coherency Loose associations  Content Preoccupation  Delusions Paranoid  Perception Hallucinations  Hallucination Auditory  Judgment Impaired  Confusion None  Danger to Self  Current suicidal ideation? Denies  Self-Injurious Behavior No self-injurious ideation or behavior indicators observed or expressed   Agreement Not to Harm Self Yes  Description of Agreement verbal  Danger to Others  Danger to Others None reported or observed

## 2023-02-27 DIAGNOSIS — F200 Paranoid schizophrenia: Secondary | ICD-10-CM | POA: Diagnosis not present

## 2023-02-27 MED ORDER — ARIPIPRAZOLE 10 MG PO TABS
20.0000 mg | ORAL_TABLET | Freq: Every day | ORAL | Status: DC
  Administered 2023-02-28 – 2023-03-03 (×4): 20 mg via ORAL
  Filled 2023-02-27 (×6): qty 2

## 2023-02-27 NOTE — [facility] Group Notes (Signed)
Adult Psychoeducational Group Note  Date:  02/27/2023 Time:  8:36 PM  Group Topic/Focus:  Wrap-Up Group:   The focus of this group is to help patients review their daily goal of treatment and discuss progress on daily workbooks.  Participation Level:  Active  Participation Quality:  Attentive and Appropriate   Affect:  Appropriate  Cognitive:  Disorganized and Delusional  Insight: Limited  Engagement in Group:  Engaged  Modes of Intervention:  Discussion  Additional Comments:   Pt states that he had a good day and has been more social today. Pt states he's been hearing people recently and has been seeing food all around him. Pt played basketball outside and attended groups.   Vevelyn Pat 02/27/2023, 8:36 PM

## 2023-02-27 NOTE — Progress Notes (Signed)
   02/27/23 0556  15 Minute Checks  Location Bedroom  Visual Appearance Calm  Behavior Sleeping  Sleep (Behavioral Health Patients Only)  Calculate sleep? (Click Yes once per 24 hr at 0600 safety check) Yes  Documented sleep last 24 hours 7.25

## 2023-02-27 NOTE — Plan of Care (Signed)
°  Problem: Education: °Goal: Emotional status will improve °Outcome: Progressing °Goal: Mental status will improve °Outcome: Progressing °Goal: Verbalization of understanding the information provided will improve °Outcome: Progressing °  °

## 2023-02-27 NOTE — Progress Notes (Signed)
Patient ID: [name] Love, male   DOB: 10-02-1995, 27 y.o.   MRN: [id number] [facility] MD Progress Note  02/27/2023 10:12 AM [name] Love  MRN:  [id number]  Subjective:   Pt is a 27 yo African American male with prior mental health diagnoses of P. Schizophrenia, Borderline Intellectual functioning, Cannabis use disorder who was transferred & admitted voluntarily to this [facility] on 12/02 after initially presenting to the [facility] on 11/29 with psychosis; Presented with disorganized thoughts, and making illogical statements, accompanied by his family members. As per documentation from the [facility]: "Pt states that he gets killed every time he states what's wrong. Pt states that his body talks to him, such as his butt. Pt denies SI, HI, AH and alcohol use at this present time. Pt endorses VH, stating that he sees letters and smoking synthetic herbs. Pt states that we can Spilde help him by providing him with liquids and blood work."     Per staff patient reports that this morning admitted hallucination and suicidal ideation but per staff he is unable to elaborate any further, questionable given patient's IED patient's report is unreliable, to follow  Today's assessment notes: On assessment today, patient presents cooperative and calm with no irritability noted he reports good sleep and good appetite he reports he had a good day yesterday denies any paranoia in the hospital but when asking him about paranoia from people outside the hospital he responds "maybe I dog looking at me trying to bark" When asking him he denies to me any passive or active SI intention or plan and he denies to me as well any hearing voices or seeing things.  He denies HI and does not present irritable.  He denies side effect of medications and does not appear sleepy and does not display any sign consistent with EPS or TD.  Principal Problem: Schizophrenia, paranoid type (HCC) Diagnosis: Principal Problem:   Schizophrenia, paranoid type  (HCC) Active Problems:   Cannabis use disorder, severe, dependence (HCC)   Borderline intellectual functioning   Cocaine use disorder (HCC)  Total Time spent with patient: 35 minutes  Past Psychiatric History:  Previous Psych Diagnoses: P. Schizophrenia  Prior inpatient treatment: Multiple times at this hospital on the C/A unit: 10/26/2023, 11/25/2014, 01/30/2025, 08/07/2015, 08/17/2015, 09/28/2015, and on the adult unit at this hospital on 03/23/2016, 03/22/2017. Current/prior outpatient treatment: [facility]er, [facility].  Prior rehab hx: Denies  Psychotherapy hx: [facility] History of suicide attempt: Denies  History of homicide or aggression: As per collateral information obtained by the [facility], pt has aggressive behaviors at home  Psychiatric medication history: Abilify LAI in the past, Ambien, Depakote, Cogentin, Haldol, Ambien in the past as per chart review. Psychiatric medication compliance history: non compliant as per chart review Neuromodulation history: Denies  Current Psychiatrist:[facility] Current therapist: [facility]   Past Medical History:  Past Medical History:  Diagnosis Date   Schizophrenia, acute (HCC)    History reviewed. No pertinent surgical history. Family History:  Family History  Problem Relation Age of Onset   Mental illness Cousin    Family Psychiatric  History: See H&P  Social History:  Social History   Substance and Sexual Activity  Alcohol Use Yes   Comment: a little      Social History   Substance and Sexual Activity  Drug Use No   Comment: denies    Social History   Socioeconomic History   Marital status: Single    Spouse name: Not on file  Number of children: Not on file   Years of education: Not on file   Highest education level: Not on file  Occupational History   Not on file  Tobacco Use   Smoking status: Every Day    Current packs/day: 0.00    Types: Cigarettes   Smokeless tobacco: Never  Substance and Sexual  Activity   Alcohol use: Yes    Comment: a little    Drug use: No    Comment: denies   Sexual activity: Not Currently  Other Topics Concern   Not on file  Social History Narrative   Not on file   Social Drivers of Health   Financial Resource Strain: Not on file  Food Insecurity: Food Insecurity Present (02/14/2023)   Hunger Vital Sign    Worried About Running Out of Food in the Last Year: Sometimes true    Ran Out of Food in the Last Year: Sometimes true  Transportation Needs: Unmet Transportation Needs (02/14/2023)   PRAPARE - Administrator, Civil Service (Medical): Yes    Lack of Transportation (Non-Medical): Yes  Physical Activity: Not on file  Stress: Not on file  Social Connections: Not on file  Current Medications: Current Facility-Administered Medications  Medication Dose Route Frequency Provider Last Rate Last Admin   acetaminophen (TYLENOL) tablet 650 mg  650 mg Oral Q6H PRN Tomie China, MD   650 mg at 02/25/23 1144   alum & mag hydroxide-simeth (MAALOX/MYLANTA) 200-200-20 MG/5ML suspension 30 mL  30 mL Oral Q4H PRN Tomie China, MD       ARIPiprazole (ABILIFY) tablet 15 mg  15 mg Oral Daily Ntuen, Jesusita Oka, FNP   15 mg at 02/27/23 0805   benztropine (COGENTIN) tablet 1 mg  1 mg Oral BID Massengill, Harrold Donath, MD   1 mg at 02/27/23 0805   haloperidol (HALDOL) tablet 5 mg  5 mg Oral TID PRN Phineas Inches, MD       And   diphenhydrAMINE (BENADRYL) capsule 50 mg  50 mg Oral TID PRN Massengill, Harrold Donath, MD       haloperidol lactate (HALDOL) injection 5 mg  5 mg Intramuscular TID PRN Massengill, Harrold Donath, MD       And   diphenhydrAMINE (BENADRYL) injection 50 mg  50 mg Intramuscular TID PRN Massengill, Harrold Donath, MD       And   LORazepam (ATIVAN) injection 2 mg  2 mg Intramuscular TID PRN Massengill, Harrold Donath, MD       haloperidol lactate (HALDOL) injection 10 mg  10 mg Intramuscular TID PRN Massengill, Harrold Donath, MD       And   diphenhydrAMINE (BENADRYL)  injection 50 mg  50 mg Intramuscular TID PRN Massengill, Harrold Donath, MD       And   LORazepam (ATIVAN) injection 2 mg  2 mg Intramuscular TID PRN Massengill, Harrold Donath, MD       divalproex (DEPAKOTE ER) 24 hr tablet 750 mg  750 mg Oral Q12H Ntuen, Tina C, FNP   750 mg at 02/27/23 0806   haloperidol (HALDOL) 2 MG/ML solution 15 mg  15 mg Oral QHS Massengill, Nathan, MD   15 mg at 02/26/23 2030   haloperidol (HALDOL) 2 MG/ML solution 5 mg  5 mg Oral Q0600 Massengill, Harrold Donath, MD   5 mg at 02/27/23 0803   hydrOXYzine (ATARAX) tablet 25 mg  25 mg Oral TID PRN Tomie China, MD   25 mg at 02/26/23 2029   magnesium hydroxide (MILK OF MAGNESIA) suspension 30 mL  30 mL Oral Daily PRN Tomie China, MD       traZODone (DESYREL) tablet 50 mg  50 mg Oral QHS PRN Tomie China, MD   50 mg at 02/26/23 2029   Vitamin D (Ergocalciferol) (DRISDOL) 1.25 MG (50000 UNIT) capsule 50,000 Units  50,000 Units Oral Q7 days Starleen Blue, NP   50,000 Units at 02/23/23 2025   white petrolatum (VASELINE) gel   Topical PRN Starleen Blue, NP   1 Application at 02/25/23 2130   Lab Results:  No results found for this or any previous visit (from the past 48 hours).  Blood Alcohol level:  Lab Results  Component Value Date   ETH <10 02/11/2023   ETH <10 02/06/2023   Metabolic Disorder Labs: Lab Results  Component Value Date   HGBA1C 5.3 02/11/2023   MPG 105.41 02/11/2023   MPG 117 11/27/2014   Lab Results  Component Value Date   PROLACTIN 57.5 (H) 08/20/2015   PROLACTIN 23.2 (H) 02/04/2015   Lab Results  Component Value Date   CHOL 202 (H) 02/11/2023   TRIG 46 02/11/2023   HDL 83 02/11/2023   CHOLHDL 2.4 02/11/2023   VLDL 9 02/11/2023   LDLCALC 110 (H) 02/11/2023   LDLCALC 152 (H) 07/15/2015    Physical Findings: AIMS: Facial and Oral Movements Muscles of Facial Expression: None Lips and Perioral Area: None Jaw: None Tongue: None,Extremity Movements Upper (arms, wrists, hands, fingers):  None Lower (legs, knees, ankles, toes): None, Trunk Movements Neck, shoulders, hips: None, Global Judgements Severity of abnormal movements overall : None Incapacitation due to abnormal movements: None Patient's awareness of abnormal movements: No Awareness, Dental Status Current problems with teeth and/or dentures?: No Does patient usually wear dentures?: No Edentia?: No  CIWA:    COWS:     + pill-rolling tremor left upper extremity at rest No other appreciable tremor, rigidity, or cogwheeling appreciated on exam on 12/8  Musculoskeletal: Strength & Muscle Tone: within normal limits Gait & Station: normal Patient leans: N/A  Psychiatric Specialty Exam:  Presentation  General Appearance:  Appropriate for Environment; Casual; Fairly Groomed  Eye Contact: Limited  Speech: Decreased amount but normal tone and volume  Speech Volume: Normal  Handedness: Right  Mood and Affect  Mood: Anxious; Depressed  Affect: Blunt  Thought Process  Thought Processes: Disorganized  Descriptions of Associations: Concrete Orientation:Partial  Thought Content: Disorganized at times  History of Schizophrenia/Schizoaffective disorder:Yes  Duration of Psychotic Symptoms:Greater than six months  Hallucinations:No data recorded  Ideas of Reference:None  Suicidal Thoughts:No data recorded Denies passive or active SI intention or plan Homicidal Thoughts:No data recorded Denies HI Sensorium  Memory: Immediate Poor; Recent Poor  Judgment: Poor  Insight: Poor  Executive Functions  Concentration: Fair  Attention Span: Poor  Recall: Poor  Fund of Knowledge: Poor  Language: Fair  Psychomotor Activity  Psychomotor Activity: No data recorded  Assets  Assets: Resilience; Physical Health  Sleep  Sleep: No data recorded  Physical Exam: Physical Exam Vitals and nursing note reviewed.  Constitutional:      General: He is not in acute distress.     Appearance: He is normal weight. He is not toxic-appearing.  HENT:     Mouth/Throat:     Mouth: Mucous membranes are moist.  Cardiovascular:     Rate and Rhythm: Normal rate.     Pulses: Normal pulses.  Pulmonary:     Effort: Pulmonary effort is normal. No respiratory distress.  Abdominal:     Comments:  Deferred  Genitourinary:    Comments: Deferred Musculoskeletal:        General: Normal range of motion.     Cervical back: Normal range of motion.  Neurological:     Mental Status: He is alert.     Motor: No weakness.     Gait: Gait normal.  Psychiatric:     Comments: Restless and tangential    Review of Systems  Constitutional:  Negative for chills and fever.  HENT:  Negative for sore throat.   Eyes:  Negative for blurred vision.  Respiratory:  Negative for cough, sputum production, shortness of breath and wheezing.   Cardiovascular:  Negative for chest pain and palpitations.  Gastrointestinal:  Negative for heartburn and nausea.  Genitourinary:  Negative for dysuria, frequency and urgency.  Musculoskeletal:  Negative for myalgias.  Neurological:  Negative for dizziness, tingling, tremors and headaches.  Endo/Heme/Allergies:        See allergy listing  Psychiatric/Behavioral:  Negative for depression, hallucinations, memory loss, substance abuse and suicidal ideas. The patient does not have insomnia.   All other systems reviewed and are negative.  Blood pressure (!) 140/77, pulse 89, temperature 98.1 F (36.7 C), temperature source Oral, resp. rate 20, height 5\' 8"  (1.727 m), weight 74 kg, SpO2 100%. Body mass index is 24.81 kg/m.  Treatment Plan Summary: Daily contact with patient to assess and evaluate symptoms and progress in treatment and Medication management  ASSESSMENT:  Diagnoses / Active Problems: Schizophrenia, paranoid type (HCC)   Cannabis use disorder, severe, dependence (HCC)   Borderline intellectual functioning   Cocaine use disorder  (HCC)  PLAN: Safety and Monitoring:  --  Voluntary admission to inpatient psychiatric unit for safety, stabilization and treatment  -- Daily contact with patient to assess and evaluate symptoms and progress in treatment  -- Patient's case to be discussed in multi-disciplinary team meeting  -- Observation Level : q15 minute checks  -- Vital signs:  q12 hours  -- Precautions: suicide, elopement, and assault  2. Psychiatric Diagnoses and Treatment:   Continue Depakote 750 mg twice daily, Depakote level due on 12/16 to ensure therapeutic level and no toxicity   Continue liquid Haldol 5 mg in the morning and 15 mg at bedtime for schizophrenia   Continue Cogentin 1 mg twice for pill-rolling tremor.    Titrate Abilify from 15 to 20 mg daily to address refractory symptoms of psychosis and monitor.  Patient psychosis have been refractory requiring using 2 antipsychotics with positive response noted.   Continue vitamin D supplementation  Continue hydroxyzine as needed for anxiety and trazodone as needed and insomnia  --  The risks/benefits/side-effects/alternatives to this medication were discussed in detail with the patient and time was given for questions. The patient consents to medication trial.    3. Medical Issues Being Addressed: None at this time   4. Discharge Planning:   -- Social work and case management to assist with discharge planning and identification of hospital follow-up needs prior to discharge  -- Estimated LOS: 5-7 more days  -- Discharge Concerns: Need to establish a safety plan; Medication compliance and effectiveness  -- Discharge Goals: Return home with outpatient referrals for mental health follow-up including medication management/psychotherapy   Total Time Spent in Direct Patient Care:  I personally spent 35 minutes on the unit in direct patient care. The direct patient care time included face-to-face time with the patient, reviewing the patient's chart,  communicating with other professionals, and coordinating care. Greater than 50%  of this time was spent in counseling or coordinating care with the patient regarding goals of hospitalization, psycho-education, and discharge planning needs.  Sarita Bottom, MD 02/27/2023, 10:12 AM  Patient ID: [name] Love, male   DOB: 10/22/1995, 27 y.o.   MRN: [id number] Patient ID: [name] Love, male   DOB: [birth year], 27 y.o.   MRN: [id number] Patient ID: [name] Love, male   DOB: Jul 24, 1995, 27 y.o.   MRN: [id number]

## 2023-02-27 NOTE — Progress Notes (Signed)
   02/27/23 0805  Psych Admission Type (Psych Patients Only)  Admission Status Voluntary  Psychosocial Assessment  Patient Complaints None  Eye Contact Fair  Facial Expression Flat  Affect Preoccupied  Speech Tangential;Soft  Interaction Assertive  Motor Activity Slow  Appearance/Hygiene Unremarkable  Behavior Characteristics Cooperative;Appropriate to situation  Mood Anxious;Preoccupied  Thought Process  Coherency Loose associations  Content Preoccupation  Delusions Paranoid  Perception Hallucinations  Hallucination Auditory  Judgment Impaired  Confusion None  Danger to Self  Current suicidal ideation? Passive  Description of Suicide Plan No plan  Self-Injurious Behavior No self-injurious ideation or behavior indicators observed or expressed   Agreement Not to Harm Self Yes  Description of Agreement Verbal

## 2023-02-28 DIAGNOSIS — F200 Paranoid schizophrenia: Secondary | ICD-10-CM | POA: Diagnosis not present

## 2023-02-28 LAB — VALPROIC ACID LEVEL: Valproic Acid Lvl: 72 ug/mL (ref 50.0–100.0)

## 2023-02-28 NOTE — Group Note (Signed)
Recreation Therapy Group Note   Group Topic:Coping Skills  Group Date: 02/28/2023 Start Time: 1001 End Time: 1040 Facilitators: Orris Perin-McCall, LRT,CTRS Location: [address] Dayroom   Group Topic: Stress Management  Goal Area(s) Addresses:  Patient will identify positive coping techniques. Patient will identify benefits of using positive copings skills post d/c.  Intervention: Worksheet, Group brain storming  Group Description:  Mind Map.  Patient was provided a blank template of a diagram with 32 blank boxes in a tiered system, branching from the center (similar to a bubble chart). LRT directed patients to label the middle of the diagram "Coping Skills" and consider 8 different instances (anger, stress, depression, fear, anxiety, lack of control, finances and time management) in which coping skills would be needed, that would be placed in the 2nd tier boxes. Patients were to then come up with 3 effective coping skills for the instances identified in 2nd tier of boxes. LRT then wrote the responses on the board for patients to fill in any blank spaces on their sheets.  Education: Stress Management, Discharge Planning.   Education Outcome: Acknowledges Education   Affect/Mood: Flat   Participation Level: Minimal   Participation Quality: Independent   Behavior: Attentive    Speech/Thought Process: Delusional   Insight: Limited   Judgement: Limited   Modes of Intervention: Worksheet   Patient Response to Interventions:  Attentive   Education Outcome:  In group clarification offered    Clinical Observations/Individualized Feedback: Pt was attentive but unable to give responses that made sense. Pt defined copings skills as something that holds things. Pt made responses that had nothing to do with the activity. Pt identified "slow eat" as a coping skill. Pt was able to sit through the activity and engage.    Plan: Continue to engage patient in RT group sessions  2-3x/week.   Nalia Honeycutt-McCall, LRT,CTRS  02/28/2023 1:34 PM

## 2023-02-28 NOTE — Progress Notes (Signed)
   02/28/23 0800  Psych Admission Type (Psych Patients Only)  Admission Status Voluntary  Psychosocial Assessment  Patient Complaints None  Eye Contact Fair  Facial Expression Flat  Affect Preoccupied  Speech Tangential;Soft  Interaction Assertive  Motor Activity Slow  Appearance/Hygiene Unremarkable  Behavior Characteristics Cooperative;Appropriate to situation  Mood Anxious;Preoccupied  Thought Process  Coherency Loose associations  Content Preoccupation  Delusions Paranoid  Perception Hallucinations  Hallucination Auditory  Judgment Limited  Confusion None  Danger to Self  Current suicidal ideation? Denies  Self-Injurious Behavior No self-injurious ideation or behavior indicators observed or expressed   Agreement Not to Harm Self Yes  Description of Agreement verbal

## 2023-02-28 NOTE — Progress Notes (Signed)
Patient ID: [name] Love, male   DOB: 17-Jun-1995, 27 y.o.   MRN: [id number] [facility] MD Progress Note  02/28/2023 3:04 PM [name] Love  MRN:  [id number]  Subjective:   Pt is a 27 yo African American male with prior mental health diagnoses of P. Schizophrenia, Borderline Intellectual functioning, Cannabis use disorder who was transferred & admitted voluntarily to this [facility] on 12/02 after initially presenting to the [facility] on 11/29 with psychosis; Presented with disorganized thoughts, and making illogical statements, accompanied by his family members. As per documentation from the [facility]: "Pt states that he gets killed every time he states what's wrong. Pt states that his body talks to him, such as his butt. Pt denies SI, HI, AH and alcohol use at this present time. Pt endorses VH, stating that he sees letters and smoking synthetic herbs. Pt states that we can Hol[place] help him by providing him with liquids and blood work."    Per staff patient reports that this morning admitted hallucination and suicidal ideation but per staff he is unable to elaborate any further, questionable given patient's IED patient's report is unreliable, to follow  Today's assessment notes: Patient presents alert, calm, and oriented to person, place, and time.  No irritability on assessment today. He reports good sleep and good appetite.  He reports anxiety of 2/10, with 10 being high severity and depression of 0/10, with 10 being high severity.  He denies any paranoia while in the hospital.  He further denies SI, HI, or VH.  When asked about auditory hallucination, reports, "I hear my own voice to "keep away from football and this self controlled my thoughts."  Patient denies adverse effects of psychotropic medications.  No signs of EPS or TD of the during this evaluation.  He continues on Depakote 750 mg p.o. twice daily, with last VPA level today at 72.  Continues on Abilify 20 mg p.o. to address refractory symptoms of psychosis,  continues on liquid Haldol 5 mg p.o. in the morning and 15 mg p.o. at bedtime for schizophrenia, and continues on Cogentin 1 mg p.o. twice daily for pill-rolling tremor.  Patient psychosis has been refractory requiring 2 antipsychotics with positive response observed.  Collateral information: Patient's mother [name] at [phone] called and made aware of patient's progress with medication regimen.  Mother reports that she saw patient on Sunday and he was not at his baseline.  Made mother aware that there is great improvement with patient's mood and response to psychotropic medication regimen.  However, she agrees with this provider and added that she is contacting ACT team today to make arrangements for patient discharge.  This provider informed patient's mother of possible discharge the on Wednesday, March 02, 2023.  She added that she would contact this provider again regarding her arrangement and after hearing from ACT team.  Principal Problem: Schizophrenia, paranoid type (HCC) Diagnosis: Principal Problem:   Schizophrenia, paranoid type (HCC) Active Problems:   Cannabis use disorder, severe, dependence (HCC)   Borderline intellectual functioning   Cocaine use disorder (HCC)  Total Time spent with patient: 35 minutes  Past Psychiatric History:  Previous Psych Diagnoses: P. Schizophrenia  Prior inpatient treatment: Multiple times at this hospital on the C/A unit: 10/26/2023, 11/25/2014, 01/30/2025, 08/07/2015, 08/17/2015, 09/28/2015, and on the adult unit at this hospital on 03/23/2016, 03/22/2017. Current/prior outpatient treatment: Vesta Mixer, [facility].  Prior rehab hx: Denies  Psychotherapy hx: [facility] History of suicide attempt: Denies  History of homicide or aggression: As  per collateral information obtained by the [facility], pt has aggressive behaviors at home  Psychiatric medication history: Abilify LAI in the past, Ambien, Depakote, Cogentin, Haldol, Ambien  in the past as per chart review. Psychiatric medication compliance history: non compliant as per chart review Neuromodulation history: Denies  Current Psychiatrist:[facility] Current therapist: [facility] Mixer   Past Medical History:  Past Medical History:  Diagnosis Date   Schizophrenia, acute (HCC)    History reviewed. No pertinent surgical history. Family History:  Family History  Problem Relation Age of Onset   Mental illness Cousin    Family Psychiatric  History: See H&P  Social History:  Social History   Substance and Sexual Activity  Alcohol Use Yes   Comment: a little      Social History   Substance and Sexual Activity  Drug Use No   Comment: denies    Social History   Socioeconomic History   Marital status: Single    Spouse name: Not on file   Number of children: Not on file   Years of education: Not on file   Highest education level: Not on file  Occupational History   Not on file  Tobacco Use   Smoking status: Every Day    Current packs/day: 0.00    Types: Cigarettes   Smokeless tobacco: Never  Substance and Sexual Activity   Alcohol use: Yes    Comment: a little    Drug use: No    Comment: denies   Sexual activity: Not Currently  Other Topics Concern   Not on file  Social History Narrative   Not on file   Social Drivers of Health   Financial Resource Strain: Not on file  Food Insecurity: Food Insecurity Present (02/14/2023)   Hunger Vital Sign    Worried About Running Out of Food in the Last Year: Sometimes true    Ran Out of Food in the Last Year: Sometimes true  Transportation Needs: Unmet Transportation Needs (02/14/2023)   PRAPARE - Administrator, Civil Service (Medical): Yes    Lack of Transportation (Non-Medical): Yes  Physical Activity: Not on file  Stress: Not on file  Social Connections: Not on file  Current Medications: Current Facility-Administered Medications  Medication Dose Route Frequency Provider Last Rate Last Admin    acetaminophen (TYLENOL) tablet 650 mg  650 mg Oral Q6H PRN Tomie China, MD   650 mg at 02/25/23 1144   alum & mag hydroxide-simeth (MAALOX/MYLANTA) 200-200-20 MG/5ML suspension 30 mL  30 mL Oral Q4H PRN Tomie China, MD       ARIPiprazole (ABILIFY) tablet 20 mg  20 mg Oral Daily Attiah, Nadir, MD   20 mg at 02/28/23 0806   benztropine (COGENTIN) tablet 1 mg  1 mg Oral BID Massengill, Harrold Donath, MD   1 mg at 02/28/23 3086   haloperidol (HALDOL) tablet 5 mg  5 mg Oral TID PRN Phineas Inches, MD       And   diphenhydrAMINE (BENADRYL) capsule 50 mg  50 mg Oral TID PRN Massengill, Harrold Donath, MD       haloperidol lactate (HALDOL) injection 5 mg  5 mg Intramuscular TID PRN Massengill, Harrold Donath, MD       And   diphenhydrAMINE (BENADRYL) injection 50 mg  50 mg Intramuscular TID PRN Massengill, Harrold Donath, MD       And   LORazepam (ATIVAN) injection 2 mg  2 mg Intramuscular TID PRN Phineas Inches, MD       haloperidol lactate (HALDOL)  injection 10 mg  10 mg Intramuscular TID PRN Massengill, Harrold Donath, MD       And   diphenhydrAMINE (BENADRYL) injection 50 mg  50 mg Intramuscular TID PRN Massengill, Harrold Donath, MD       And   LORazepam (ATIVAN) injection 2 mg  2 mg Intramuscular TID PRN Massengill, Harrold Donath, MD       divalproex (DEPAKOTE ER) 24 hr tablet 750 mg  750 mg Oral Q12H Sherhonda Gaspar C, FNP   750 mg at 02/28/23 1610   haloperidol (HALDOL) 2 MG/ML solution 15 mg  15 mg Oral QHS Massengill, Harrold Donath, MD   15 mg at 02/27/23 2040   haloperidol (HALDOL) 2 MG/ML solution 5 mg  5 mg Oral Q0600 Massengill, Harrold Donath, MD   5 mg at 02/28/23 9604   hydrOXYzine (ATARAX) tablet 25 mg  25 mg Oral TID PRN Tomie China, MD   25 mg at 02/26/23 2029   magnesium hydroxide (MILK OF MAGNESIA) suspension 30 mL  30 mL Oral Daily PRN Tomie China, MD       traZODone (DESYREL) tablet 50 mg  50 mg Oral QHS PRN Tomie China, MD   50 mg at 02/27/23 2041   Vitamin D (Ergocalciferol) (DRISDOL) 1.25 MG (50000  UNIT) capsule 50,000 Units  50,000 Units Oral Q7 days Starleen Blue, NP   50,000 Units at 02/23/23 2025   white petrolatum (VASELINE) gel   Topical PRN Starleen Blue, NP   1 Application at 02/25/23 5409   Lab Results:  Results for orders placed or performed during the hospital encounter of 02/14/23 (from the past 48 hours)  Valproic acid level     Status: None   Collection Time: 02/28/23  6:28 AM  Result Value Ref Range   Valproic Acid Lvl 72 50.0 - 100.0 ug/mL    Comment: Performed at [facility], [address], [place], [place] [address]   Blood Alcohol level:  Lab Results  Component Value Date   [facility] <10 02/11/2023   ETH <10 02/06/2023   Metabolic Disorder Labs: Lab Results  Component Value Date   HGBA1C 5.3 02/11/2023   MPG 105.41 02/11/2023   MPG 117 11/27/2014   Lab Results  Component Value Date   PROLACTIN 57.5 (H) 08/20/2015   PROLACTIN 23.2 (H) 02/04/2015   Lab Results  Component Value Date   CHOL 202 (H) 02/11/2023   TRIG 46 02/11/2023   HDL 83 02/11/2023   CHOLHDL 2.4 02/11/2023   VLDL 9 02/11/2023   LDLCALC 110 (H) 02/11/2023   LDLCALC 152 (H) 07/15/2015   Physical Findings: AIMS: Facial and Oral Movements Muscles of Facial Expression: None Lips and Perioral Area: None Jaw: None Tongue: None,Extremity Movements Upper (arms, wrists, hands, fingers): None Lower (legs, knees, ankles, toes): None, Trunk Movements Neck, shoulders, hips: None, Global Judgements Severity of abnormal movements overall : None Incapacitation due to abnormal movements: None Patient's awareness of abnormal movements: No Awareness, Dental Status Current problems with teeth and/or dentures?: No Does patient usually wear dentures?: No Edentia?: No  CIWA:    COWS:     + pill-rolling tremor left upper extremity at rest No other appreciable tremor, rigidity, or cogwheeling appreciated on exam on 12/8  Musculoskeletal: Strength & Muscle Tone: within normal  limits Gait & Station: normal Patient leans: N/A  Psychiatric Specialty Exam:  Presentation  General Appearance:  Casual  Eye Contact: Limited  Speech: Decreased amount but normal tone and volume  Speech Volume: Normal  Handedness: Right  Mood and Affect  Mood: Anxious (Improving)  Affect: Congruent  Thought Process  Thought Processes: Disorganized (However improving)  Descriptions of Associations: Concrete Orientation:Partial  Thought Content: Disorganized at times  History of Schizophrenia/Schizoaffective disorder:Yes  Duration of Psychotic Symptoms:Greater than six months  Hallucinations:Hallucinations: None (Patient reports hearing his own voice telling him to keep away from football) Description of Auditory Hallucinations: none  Ideas of Reference:None  Suicidal Thoughts:Suicidal Thoughts: No  Denies passive or active SI intention or plan Homicidal Thoughts:Homicidal Thoughts: No  Denies HI Sensorium  Memory: Immediate Poor; Recent Poor  Judgment: Impaired  Insight: Poor  Executive Functions  Concentration: Fair  Attention Span: Fair  Recall: Poor  Fund of Knowledge: Poor  Language: Fair  Psychomotor Activity  Psychomotor Activity: Psychomotor Activity: Normal  Assets  Assets: Physical Health; Resilience  Sleep  Sleep: Sleep: Good Number of Hours of Sleep: 8.25  Physical Exam: Physical Exam Vitals and nursing note reviewed.  Constitutional:      General: He is not in acute distress.    Appearance: He is normal weight. He is not toxic-appearing.  HENT:     Head: Normocephalic.     Nose: Nose normal.     Mouth/Throat:     Mouth: Mucous membranes are moist.  Cardiovascular:     Rate and Rhythm: Normal rate.     Pulses: Normal pulses.  Pulmonary:     Effort: Pulmonary effort is normal. No respiratory distress.  Abdominal:     Comments: Deferred  Genitourinary:    Comments: Deferred Musculoskeletal:         General: Normal range of motion.     Cervical back: Normal range of motion.  Skin:    General: Skin is warm.  Neurological:     Mental Status: He is alert.     Motor: No weakness.     Gait: Gait normal.  Psychiatric:        Mood and Affect: Mood normal.        Behavior: Behavior normal.     Comments: Restless and tangential    Review of Systems  Constitutional:  Negative for chills and fever.  HENT:  Negative for sore throat.   Eyes:  Negative for blurred vision.  Respiratory:  Negative for cough, sputum production, shortness of breath and wheezing.   Cardiovascular:  Negative for chest pain and palpitations.  Gastrointestinal:  Negative for heartburn and nausea.  Genitourinary:  Negative for dysuria, frequency and urgency.  Musculoskeletal:  Negative for myalgias.  Skin:  Negative for itching and rash.  Neurological:  Negative for dizziness, tingling, tremors and headaches.  Endo/Heme/Allergies:        See allergy listing  Psychiatric/Behavioral:  Negative for depression, hallucinations, memory loss, substance abuse and suicidal ideas. The patient does not have insomnia.   All other systems reviewed and are negative.  Blood pressure 132/80, pulse 83, temperature 98.2 F (36.8 C), temperature source Oral, resp. rate (!) 22, height 5\' 8"  (1.727 m), weight 74 kg, SpO2 100%. Body mass index is 24.81 kg/m.  Treatment Plan Summary: Daily contact with patient to assess and evaluate symptoms and progress in treatment and Medication management  ASSESSMENT:  Diagnoses / Active Problems: Schizophrenia, paranoid type (HCC)   Cannabis use disorder, severe, dependence (HCC)   Borderline intellectual functioning   Cocaine use disorder (HCC)  PLAN: Safety and Monitoring:  --  Voluntary admission to inpatient psychiatric unit for safety, stabilization and treatment  -- Daily contact with patient to assess and  evaluate symptoms and progress in treatment  -- Patient's case to be  discussed in multi-disciplinary team meeting  -- Observation Level : q15 minute checks  -- Vital signs:  q12 hours  -- Precautions: suicide, elopement, and assault  2. Psychiatric Diagnoses and Treatment:   Continue Depakote 750 mg twice daily, Depakote level due on 12/16 to ensure therapeutic level and no toxicity   Continue liquid Haldol 5 mg in the morning and 15 mg at bedtime for schizophrenia   Continue Cogentin 1 mg twice for pill-rolling tremor.    Titrate Abilify from 15 to 20 mg daily to address refractory symptoms of psychosis and monitor.  Patient psychosis have been refractory requiring using 2 antipsychotics with positive response noted.   Continue vitamin D supplementation  Continue hydroxyzine as needed for anxiety and trazodone as needed and insomnia  --  The risks/benefits/side-effects/alternatives to this medication were discussed in detail with the patient and time was given for questions. The patient consents to medication trial.    3. Medical Issues Being Addressed: None at this time   4. Discharge Planning:   -- Social work and case management to assist with discharge planning and identification of hospital follow-up needs prior to discharge  -- Estimated LOS: 5-7 more days  -- Discharge Concerns: Need to establish a safety plan; Medication compliance and effectiveness  -- Discharge Goals: Return home with outpatient referrals for mental health follow-up including medication management/psychotherapy   Cecilie Lowers, FNP 02/28/2023, 3:04 PM  Patient ID: [name] Love, male   DOB: 12-17-1995, 28 y.o.   MRN: [id number] Patient ID: [name] Love, male   DOB: 24-Mar-1995, 27 y.o.   MRN: [id number] Patient ID: [name] Love, male   DOB: 19-May-1995, 27 y.o.   MRN: [id number] Patient ID: [name] Love, male   DOB: [birth year], 27 y.o.   MRN: [id number]

## 2023-02-28 NOTE — Progress Notes (Signed)
   02/28/23 2023  Psych Admission Type (Psych Patients Only)  Admission Status Voluntary  Psychosocial Assessment  Patient Complaints None  Eye Contact Fair  Facial Expression Flat  Affect Preoccupied  Speech Tangential  Interaction Assertive  Motor Activity Slow  Appearance/Hygiene Unremarkable  Behavior Characteristics Cooperative  Mood Preoccupied  Thought Process  Coherency Loose associations  Content Preoccupation  Delusions None reported or observed  Perception Hallucinations  Hallucination Auditory  Judgment Impaired  Confusion None  Danger to Self  Current suicidal ideation? Denies  Self-Injurious Behavior No self-injurious ideation or behavior indicators observed or expressed   Agreement Not to Harm Self Yes  Description of Agreement verbal  Danger to Others  Danger to Others None reported or observed

## 2023-02-28 NOTE — Plan of Care (Signed)
  Problem: Education: Goal: Knowledge of [facility] Education information/materials will improve Outcome: Progressing Goal: Emotional status will improve Outcome: Progressing Goal: Mental status will improve Outcome: Progressing Goal: Verbalization of understanding the information provided will improve Outcome: Progressing   Problem: Activity: Goal: Interest or engagement in activities will improve Outcome: Progressing   Problem: Coping: Goal: Ability to demonstrate self-control will improve Outcome: Progressing   Problem: Safety: Goal: Periods of time without injury will increase Outcome: Progressing

## 2023-02-28 NOTE — [facility] Group Notes (Signed)
Adult Psychoeducational Group Note  Date:  02/28/2023 Time:  8:19 PM  Group Topic/Focus:  Wrap-Up Group:   The focus of this group is to help patients review their daily goal of treatment and discuss progress on daily workbooks.  Participation Level:  Active  Participation Quality:  Appropriate  Affect:  Flat  Cognitive:  Disorganized and Delusional  Insight: Limited  Engagement in Group:  Engaged  Modes of Intervention:  Discussion  Additional Comments:   Pt states he had a good day and has been feeling a lot better. Pt states that he spoke with his mother today who is supposed to visit him tomorrow. Pt denies everything  Vevelyn Pat 02/28/2023, 8:19 PM

## 2023-02-28 NOTE — Group Note (Signed)
LCSW Group Therapy Note   Group Date: 02/28/2023 Start Time: 1315 End Time: 1405   Type of Therapy and Topic:  Group Therapy - Who Am I?  Participation Level:  Active   Description of Group The focus of this group was to aid patients in self-exploration and awareness. Patients were guided in exploring various factors of oneself to include interests, readiness to change, management of emotions, and individual perception of self. Patients were provided with complementary worksheets exploring hidden talents, ease of asking other for help, music/media preferences, understanding and responding to feelings/emotions, and hope for the future. At group closing, patients were encouraged to adhere to discharge plan to assist in continued self-exploration and understanding.  Therapeutic Goals Patients learned that self-exploration and awareness is an ongoing process Patients identified their individual skills, preferences, and abilities Patients explored their openness to establish and confide in supports Patients explored their readiness for change and progression of mental health   Summary of Patient Progress:  Patient actively engaged in introductory check-in. Patient actively engaged in activity of self-exploration and identification, completing complementary worksheet to assist in discussion. Patient identified various factors ranging from hidden talents, favorite music and movies, trusted individuals, accountability, and individual perceptions of self and hope. Pt engaged in processing thoughts and feelings as well as means of reframing thoughts. Pt proved receptive of alternate group members input and feedback from CSW.   BestCandace Cruise, LCSWA 02/28/2023  2:10 PM

## 2023-02-28 NOTE — Progress Notes (Signed)
   02/27/23 2200  Psych Admission Type (Psych Patients Only)  Admission Status Voluntary  Psychosocial Assessment  Patient Complaints None  Eye Contact Fair  Facial Expression Anxious;Worried  Affect Preoccupied  Consulting civil engineer Activity Slow  Appearance/Hygiene Unremarkable  Behavior Characteristics Cooperative;Appropriate to situation  Mood Preoccupied  Thought Process  Coherency Loose associations  Content Preoccupation  Delusions Paranoid  Perception Hallucinations  Hallucination Auditory;Visual  Judgment Poor  Confusion None  Danger to Self  Current suicidal ideation? Denies  Self-Injurious Behavior No self-injurious ideation or behavior indicators observed or expressed   Agreement Not to Harm Self Yes  Description of Agreement verbal  Danger to Others  Danger to Others None reported or observed

## 2023-02-28 NOTE — Plan of Care (Signed)

## 2023-02-28 NOTE — Plan of Care (Signed)
   Problem: Activity: Goal: Interest or engagement in activities will improve Outcome: Progressing Goal: Sleeping patterns will improve Outcome: Progressing   Problem: Safety: Goal: Periods of time without injury will increase Outcome: Progressing

## 2023-03-01 DIAGNOSIS — F200 Paranoid schizophrenia: Secondary | ICD-10-CM | POA: Diagnosis not present

## 2023-03-01 NOTE — Progress Notes (Signed)
   03/01/23 0600  15 Minute Checks  Location Bedroom  Visual Appearance Calm  Behavior Composed  Sleep (Behavioral Health Patients Only)  Calculate sleep? (Click Yes once per 24 hr at 0600 safety check) Yes  Documented sleep last 24 hours 7.75

## 2023-03-01 NOTE — Progress Notes (Signed)
Patient ID: Merrilyn Puma, male   DOB: 02-25-1996, 27 y.o.   MRN: [id number] [facility] MD Progress Note  03/01/2023 4:57 PM Jameon Adel  MRN:  [id number]  Reason for admission:   Pt is a 27 yo African American male with prior mental health diagnoses of P. Schizophrenia, Borderline Intellectual functioning, Cannabis use disorder who was transferred & admitted voluntarily to this [facility] on 12/02 after initially presenting to the [facility] on 11/29 with psychosis; Presented with disorganized thoughts, and making illogical statements, accompanied by his family members. As per documentation from the [facility]: "Pt states that he gets killed every time he states what's wrong. Pt states that his body talks to him, such as his butt. Pt denies SI, HI, AH and alcohol use at this present time. Pt endorses VH, stating that he sees letters and smoking synthetic herbs. Pt states that we can [place] help him by providing him with liquids and blood work."    Per staff patient reports that this morning admitted hallucination and suicidal ideation but per staff he is unable to elaborate any further, questionable given patient's IED patient's report is unreliable, to follow  Today's assessment notes: Patient presents alert, calm, and oriented to person, place, and time.  ACT team in to assess patient, however the team member relates that patient is still in the hospital and could not fully performed the assessment until patient is safely discharged.  ACT team reports that patient's mother does not want patient at home but rather be placed in another facility.  With this message patient becomes upset with depressed mood.  Nursing staff report patient sleeping 7.75 hours last night.  He reports good appetite today.  Speech remains tangential and disorganized.  He reports anxiety of  0/10 and depression of 2/10, with 10 being high severity.  He denies paranoia, and further denies SI, HI, or AVH. No signs of EPS or TD of the during this  evaluation.  He continues on Depakote 750 mg p.o. twice daily, with last VPA level 72.  Continues on Abilify 20 mg p.o. to address refractory symptoms of psychosis, continues on liquid Haldol 5 mg p.o. in the morning and 15 mg p.o. at bedtime for schizophrenia, and continues on Cogentin 1 mg p.o. twice daily for pill-rolling tremor.  Patient continues on 2 antipsychotics with positive response observed for refractory psychosis.  02/28/2023: Collateral information: Patient's mother Whitley,Tasha at [phone] called and made aware of patient's progress with medication regimen.  Mother reports that she saw patient on Sunday and he was not at his baseline.  Made mother aware that there is great improvement with patient's mood and response to psychotropic medication regimen.  However, she agrees with this provider and added that she is contacting ACT team today to make arrangements for patient discharge.  This provider informed patient's mother of possible discharge the on Wednesday, March 02, 2023.  She added that she would contact this provider again regarding her arrangement and after hearing from ACT team.  Principal Problem: Schizophrenia, paranoid type (HCC) Diagnosis: Principal Problem:   Schizophrenia, paranoid type (HCC) Active Problems:   Cannabis use disorder, severe, dependence (HCC)   Borderline intellectual functioning   Cocaine use disorder (HCC)  Total Time spent with patient: 35 minutes  Past Psychiatric History:  Previous Psych Diagnoses: P. Schizophrenia  Prior inpatient treatment: Multiple times at this hospital on the C/A unit: 10/26/2023, 11/25/2014, 01/30/2025, 08/07/2015, 08/17/2015, 09/28/2015, and on the adult unit at this hospital on 03/23/2016, 03/22/2017. Current/prior  outpatient treatment: Vesta Mixer, [facility].  Prior rehab hx: Denies  Psychotherapy hx: [facility] History of suicide attempt: Denies  History of homicide or aggression: As per  collateral information obtained by the [facility], pt has aggressive behaviors at home  Psychiatric medication history: Abilify LAI in the past, Ambien, Depakote, Cogentin, Haldol, Ambien in the past as per chart review. Psychiatric medication compliance history: non compliant as per chart review Neuromodulation history: Denies  Current Psychiatrist:[facility] Current therapist: [facility]er   Past Medical History:  Past Medical History:  Diagnosis Date   Schizophrenia, acute (HCC)    History reviewed. No pertinent surgical history. Family History:  Family History  Problem Relation Age of Onset   Mental illness Cousin    Family Psychiatric  History: See H&P  Social History:  Social History   Substance and Sexual Activity  Alcohol Use Yes   Comment: a little      Social History   Substance and Sexual Activity  Drug Use No   Comment: denies    Social History   Socioeconomic History   Marital status: Single    Spouse name: Not on file   Number of children: Not on file   Years of education: Not on file   Highest education level: Not on file  Occupational History   Not on file  Tobacco Use   Smoking status: Every Day    Current packs/day: 0.00    Types: Cigarettes   Smokeless tobacco: Never  Substance and Sexual Activity   Alcohol use: Yes    Comment: a little    Drug use: No    Comment: denies   Sexual activity: Not Currently  Other Topics Concern   Not on file  Social History Narrative   Not on file   Social Drivers of Health   Financial Resource Strain: Not on file  Food Insecurity: Food Insecurity Present (02/14/2023)   Hunger Vital Sign    Worried About Running Out of Food in the Last Year: Sometimes true    Ran Out of Food in the Last Year: Sometimes true  Transportation Needs: Unmet Transportation Needs (02/14/2023)   PRAPARE - Administrator, Civil Service (Medical): Yes    Lack of Transportation (Non-Medical): Yes  Physical Activity: Not on file   Stress: Not on file  Social Connections: Not on file  Current Medications: Current Facility-Administered Medications  Medication Dose Route Frequency Provider Last Rate Last Admin   acetaminophen (TYLENOL) tablet 650 mg  650 mg Oral Q6H PRN Tomie China, MD   650 mg at 02/25/23 1144   alum & mag hydroxide-simeth (MAALOX/MYLANTA) 200-200-20 MG/5ML suspension 30 mL  30 mL Oral Q4H PRN Tomie China, MD       ARIPiprazole (ABILIFY) tablet 20 mg  20 mg Oral Daily Attiah, Nadir, MD   20 mg at 03/01/23 0829   benztropine (COGENTIN) tablet 1 mg  1 mg Oral BID Massengill, Harrold Donath, MD   1 mg at 03/01/23 6948   haloperidol (HALDOL) tablet 5 mg  5 mg Oral TID PRN Phineas Inches, MD       And   diphenhydrAMINE (BENADRYL) capsule 50 mg  50 mg Oral TID PRN Massengill, Harrold Donath, MD       haloperidol lactate (HALDOL) injection 5 mg  5 mg Intramuscular TID PRN Massengill, Harrold Donath, MD       And   diphenhydrAMINE (BENADRYL) injection 50 mg  50 mg Intramuscular TID PRN Phineas Inches, MD  And   LORazepam (ATIVAN) injection 2 mg  2 mg Intramuscular TID PRN Massengill, Harrold Donath, MD       haloperidol lactate (HALDOL) injection 10 mg  10 mg Intramuscular TID PRN Massengill, Harrold Donath, MD       And   diphenhydrAMINE (BENADRYL) injection 50 mg  50 mg Intramuscular TID PRN Massengill, Harrold Donath, MD       And   LORazepam (ATIVAN) injection 2 mg  2 mg Intramuscular TID PRN Massengill, Harrold Donath, MD       divalproex (DEPAKOTE ER) 24 hr tablet 750 mg  750 mg Oral Q12H Yoav Okane C, FNP   750 mg at 03/01/23 0830   haloperidol (HALDOL) 2 MG/ML solution 15 mg  15 mg Oral QHS Massengill, Harrold Donath, MD   15 mg at 02/28/23 2023   haloperidol (HALDOL) 2 MG/ML solution 5 mg  5 mg Oral Q0600 Massengill, Harrold Donath, MD   5 mg at 03/01/23 8469   hydrOXYzine (ATARAX) tablet 25 mg  25 mg Oral TID PRN Tomie China, MD   25 mg at 02/28/23 2023   magnesium hydroxide (MILK OF MAGNESIA) suspension 30 mL  30 mL Oral Daily PRN  Tomie China, MD       traZODone (DESYREL) tablet 50 mg  50 mg Oral QHS PRN Tomie China, MD   50 mg at 02/28/23 2023   Vitamin D (Ergocalciferol) (DRISDOL) 1.25 MG (50000 UNIT) capsule 50,000 Units  50,000 Units Oral Q7 days Starleen Blue, NP   50,000 Units at 02/23/23 2025   white petrolatum (VASELINE) gel   Topical PRN Starleen Blue, NP   1 Application at 02/25/23 6295   Lab Results:  Results for orders placed or performed during the hospital encounter of 02/14/23 (from the past 48 hours)  Valproic acid level     Status: None   Collection Time: 02/28/23  6:28 AM  Result Value Ref Range   Valproic Acid Lvl 72 50.0 - 100.0 ug/mL    Comment: Performed at [facility], [address] [place], [place] [address]   Blood Alcohol level:  Lab Results  Component Value Date   [facility] <10 02/11/2023   ETH <10 02/06/2023   Metabolic Disorder Labs: Lab Results  Component Value Date   HGBA1C 5.3 02/11/2023   MPG 105.41 02/11/2023   MPG 117 11/27/2014   Lab Results  Component Value Date   PROLACTIN 57.5 (H) 08/20/2015   PROLACTIN 23.2 (H) 02/04/2015   Lab Results  Component Value Date   CHOL 202 (H) 02/11/2023   TRIG 46 02/11/2023   HDL 83 02/11/2023   CHOLHDL 2.4 02/11/2023   VLDL 9 02/11/2023   LDLCALC 110 (H) 02/11/2023   LDLCALC 152 (H) 07/15/2015   Physical Findings: AIMS: Facial and Oral Movements Muscles of Facial Expression: None Lips and Perioral Area: None Jaw: None Tongue: None,Extremity Movements Upper (arms, wrists, hands, fingers): None Lower (legs, knees, ankles, toes): None, Trunk Movements Neck, shoulders, hips: None, Global Judgements Severity of abnormal movements overall : None Incapacitation due to abnormal movements: None Patient's awareness of abnormal movements: No Awareness, Dental Status Current problems with teeth and/or dentures?: No Does patient usually wear dentures?: No Edentia?: No  CIWA:    COWS:     +  pill-rolling tremor left upper extremity at rest No other appreciable tremor, rigidity, or cogwheeling appreciated on exam on 12/8  Musculoskeletal: Strength & Muscle Tone: within normal limits Gait & Station: normal Patient leans: N/A  Psychiatric Specialty Exam:  Presentation  General Appearance:  Casual; Fairly Groomed  Eye Contact: Limited  Speech: Decreased amount but normal tone and volume  Speech Volume: Normal  Handedness: Right  Mood and Affect  Mood: Depressed (However improving)  Affect: Congruent  Thought Process  Thought Processes: Coherent (Slightly disorganized)  Descriptions of Associations: Concrete Orientation:Full (Time, Place and Person)  Thought Content: Disorganized at times  History of Schizophrenia/Schizoaffective disorder:Yes  Duration of Psychotic Symptoms:Greater than six months  Hallucinations:Hallucinations: None Description of Auditory Hallucinations: None  Ideas of Reference:None  Suicidal Thoughts:Suicidal Thoughts: No  Denies passive or active SI intention or plan Homicidal Thoughts:Homicidal Thoughts: No  Denies HI Sensorium  Memory: Immediate Poor; Recent Poor  Judgment: Impaired  Insight: Poor  Executive Functions  Concentration: Fair  Attention Span: Fair  Recall: Poor  Fund of Knowledge: Poor  Language: Fair  Psychomotor Activity  Psychomotor Activity: Psychomotor Activity: Normal  Assets  Assets: Physical Health; Resilience  Sleep  Sleep: Sleep: Good Number of Hours of Sleep: 7.75  Physical Exam: Physical Exam Vitals and nursing note reviewed.  Constitutional:      General: He is not in acute distress.    Appearance: He is normal weight. He is not toxic-appearing.  HENT:     Head: Normocephalic.     Nose: Nose normal.     Mouth/Throat:     Mouth: Mucous membranes are moist.  Cardiovascular:     Rate and Rhythm: Normal rate.     Pulses: Normal pulses.  Pulmonary:      Effort: Pulmonary effort is normal. No respiratory distress.  Abdominal:     Comments: Deferred  Genitourinary:    Comments: Deferred Musculoskeletal:        General: Normal range of motion.     Cervical back: Normal range of motion.  Skin:    General: Skin is warm.  Neurological:     Mental Status: He is alert.     Motor: No weakness.     Gait: Gait normal.  Psychiatric:        Mood and Affect: Mood normal.        Behavior: Behavior normal.     Comments: Restless and tangential    Review of Systems  Constitutional:  Negative for chills and fever.  HENT:  Negative for sore throat.   Eyes:  Negative for blurred vision.  Respiratory:  Negative for cough, sputum production, shortness of breath and wheezing.   Cardiovascular:  Negative for chest pain and palpitations.  Gastrointestinal:  Negative for heartburn and nausea.  Genitourinary:  Negative for dysuria, frequency and urgency.  Musculoskeletal:  Negative for myalgias.  Skin:  Negative for itching and rash.  Neurological:  Negative for dizziness, tingling, tremors and headaches.  Endo/Heme/Allergies:        See allergy listing  Psychiatric/Behavioral:  Negative for depression, hallucinations, memory loss, substance abuse and suicidal ideas. The patient does not have insomnia.   All other systems reviewed and are negative.  Blood pressure 131/71, pulse 86, temperature 98 F (36.7 C), temperature source Oral, resp. rate (!) 22, height 5\' 8"  (1.727 m), weight 74 kg, SpO2 100%. Body mass index is 24.81 kg/m.  Treatment Plan Summary: Daily contact with patient to assess and evaluate symptoms and progress in treatment and Medication management  ASSESSMENT:  Diagnoses / Active Problems: Schizophrenia, paranoid type (HCC)   Cannabis use disorder, severe, dependence (HCC)   Borderline intellectual functioning   Cocaine use disorder (HCC)  PLAN: Safety and Monitoring:  --  Voluntary admission to inpatient psychiatric unit  for safety, stabilization and treatment  -- Daily contact with patient to assess and evaluate symptoms and progress in treatment  -- Patient's case to be discussed in multi-disciplinary team meeting  -- Observation Level : q15 minute checks  -- Vital signs:  q12 hours  -- Precautions: suicide, elopement, and assault  2. Psychiatric Diagnoses and Treatment:   Continue Depakote 750 mg twice daily, Depakote level due on 12/16 to ensure therapeutic level and no toxicity   Continue liquid Haldol 5 mg in the morning and 15 mg at bedtime for schizophrenia   Continue Cogentin 1 mg twice for pill-rolling tremor.    Continue Abilify 20 mg daily to address refractory symptoms of psychosis and monitor.  Patient psychosis have been refractory requiring using 2 antipsychotics with positive response noted.   Continue vitamin D supplementation  Continue hydroxyzine as needed for anxiety and trazodone as needed and insomnia  --  The risks/benefits/side-effects/alternatives to this medication were discussed in detail with the patient and time was given for questions. The patient consents to medication trial.    3. Medical Issues Being Addressed: None at this time   4. Discharge Planning:   -- Social work and case management to assist with discharge planning and identification of hospital follow-up needs prior to discharge  -- Estimated LOS: 5-7 more days  -- Discharge Concerns: Need to establish a safety plan; Medication compliance and effectiveness  -- Discharge Goals: Return home with outpatient referrals for mental health follow-up including medication management/psychotherapy   Cecilie Lowers, FNP 03/01/2023, 4:57 PM  Patient ID: Merrilyn Puma, male   DOB: [birth year], 27 y.o.   MRN: [id number] Patient ID: Merrilyn Puma, male   DOB: [birth year], 27 y.o.   MRN: [id number] Patient ID: Merrilyn Puma, male   DOB: 27-Feb-1996, 27 y.o.   MRN: [id number] Patient ID: Merrilyn Puma, male   DOB: 1996-02-15, 27 y.o.    MRN: [id number] Patient ID: Merrilyn Puma, male   DOB: 1995-03-28, 27 y.o.   MRN: [id number]

## 2023-03-01 NOTE — Plan of Care (Signed)
  Problem: Education: Goal: Emotional status will improve Outcome: Progressing   Problem: Activity: Goal: Interest or engagement in activities will improve Outcome: Progressing   Problem: Safety: Goal: Periods of time without injury will increase Outcome: Progressing

## 2023-03-01 NOTE — Progress Notes (Signed)
   03/01/23 2000  Psych Admission Type (Psych Patients Only)  Admission Status Voluntary  Psychosocial Assessment  Patient Complaints None  Eye Contact Fair  Facial Expression Flat  Affect Preoccupied  Speech Tangential  Interaction Assertive  Motor Activity Slow  Appearance/Hygiene Unremarkable  Behavior Characteristics Cooperative  Mood Anxious;Preoccupied  Thought Process  Coherency Loose associations;Disorganized  Content Preoccupation  Delusions None reported or observed  Perception WDL  Hallucination None reported or observed  Judgment Impaired  Confusion None  Danger to Self  Current suicidal ideation? Denies  Self-Injurious Behavior No self-injurious ideation or behavior indicators observed or expressed   Agreement Not to Harm Self Yes  Description of Agreement verbal  Danger to Others  Danger to Others None reported or observed

## 2023-03-01 NOTE — Group Note (Signed)
Recreation Therapy Group Note   Group Topic:Health and Wellness  Group Date: 03/01/2023 Start Time: 1044 End Time: 1120 Facilitators: Shanda Cadotte-McCall, LRT,CTRS Location: [address] Day[address]   Group Topic: Wellness  Goal Area(s) Addresses:  Patient will define components of whole wellness. Patient will verbalize benefit of whole wellness.  Group Description: Exercise. LRT discussed physical health with patients and the group activity. Patients were instructed they would take turns leading the group in the exercises of their choosing. Patients were instructed to take breaks and get water as needed. LRT provided the music for the wellness activity.    Education: Wellness, Building control surveyor.   Education Outcome: Acknowledges education/In group clarification offered/Needs additional education.    Affect/Mood: Appropriate   Participation Level: Active   Participation Quality: Independent   Behavior: Attentive    Speech/Thought Process: Irrational   Insight: Limited   Judgement: Limited   Modes of Intervention: Music   Patient Response to Interventions:  Attentive   Education Outcome:  In group clarification offered    Clinical Observations/Individualized Feedback: Pt was attentive in group. Pt attempted to complete the exercises presented. Pt had moments were he would drift off into his own thoughts. Pt made some comments about his superpowers, which had nothing to do with group. Pt was eventually called out of group to meet with social worker but later returned.     Plan: Continue to engage patient in RT group sessions 2-3x/week.   Matej Sappenfield-McCall, LRT,CTRS  03/01/2023 12:04 PM

## 2023-03-01 NOTE — Progress Notes (Signed)
CSW spoke with pt's ACTT Team Lead Darcus Pester [phone] who reported they will visit pt today either before 1:00 pm or 3:45 pm. Treatment Team made aware. CSW will continue to follow.

## 2023-03-01 NOTE — [facility] Group Notes (Signed)
Adult Psychoeducational Group Note  Date:  03/01/2023 Time:  4:19 PM  Group Topic/Focus:  Goals Group:   The focus of this group is to help patients establish daily goals to achieve during treatment and discuss how the patient can incorporate goal setting into their daily lives to aide in recovery. Orientation:   The focus of this group is to educate the patient on the purpose and policies of crisis stabilization and provide a format to answer questions about their admission.  The group details unit policies and expectations of patients while admitted.  Participation Level:  Active  Participation Quality:  Attentive  Affect:  Appropriate  Cognitive:  Appropriate  Insight: Appropriate  Engagement in Group:  Engaged  Modes of Intervention:  Discussion  Additional Comments:  Pt attended the goals group and remained appropriate and engaged throughout the duration of the group.   Sheran Lawless 03/01/2023, 4:19 PM

## 2023-03-01 NOTE — Group Note (Signed)
Date:  03/01/2023 Time:  9:53 PM  Group Topic/Focus:  Wrap-Up Group:   The focus of this group is to help patients review their daily goal of treatment and discuss progress on daily workbooks.    Participation Level:  Active  Participation Quality:  Appropriate  Affect:  Appropriate  Cognitive:  Appropriate  Insight: Appropriate  Engagement in Group:  Developing/Improving  Modes of Intervention:  Discussion  Additional Comments:  Pt stated his goal for today was to focus on his treatment plan. Pt stated he accomplished his goal today. Pt stated he talked with his doctor and social worker about his care today. Pt rated his overall day a 7 out of 10. Pt stated he was able to contact his parents and his sister today which improved his day. Pt stated he felt better about himself today. Pt stated he was able to attend all meals. Pt stated he took all medications provided today. Pt stated he attend all groups held today. Pt stated his appetite was pretty good today. Pt rated sleep last night was pretty good. Pt stated the goal tonight was to get some rest. Pt stated he had no physical pain tonight. Pt deny visual hallucinations and auditory issues tonight. Pt denies thoughts of harming himself or others. Pt stated he would alert staff if anything change.  Felipa Furnace 03/01/2023, 9:53 PM
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
## 2023-03-01 NOTE — Plan of Care (Signed)
  Problem: Education: Goal: Knowledge of [facility] Education information/materials will improve Outcome: Progressing Goal: Emotional status will improve Outcome: Progressing Goal: Mental status will improve Outcome: Progressing Goal: Verbalization of understanding the information provided will improve Outcome: Progressing   Problem: Activity: Goal: Interest or engagement in activities will improve Outcome: Progressing   Problem: Coping: Goal: Ability to demonstrate self-control will improve Outcome: Progressing   Problem: Safety: Goal: Periods of time without injury will increase Outcome: Progressing

## 2023-03-01 NOTE — Progress Notes (Signed)
   03/01/23 0900  Psych Admission Type (Psych Patients Only)  Admission Status Voluntary  Psychosocial Assessment  Patient Complaints None  Eye Contact Fair  Facial Expression Flat;Anxious  Affect Preoccupied  Speech Tangential  Interaction Assertive  Motor Activity Slow  Appearance/Hygiene Unremarkable  Behavior Characteristics Cooperative  Mood Preoccupied  Thought Process  Coherency Loose associations  Content Preoccupation  Delusions None reported or observed  Perception Hallucinations  Hallucination Auditory  Judgment Impaired  Confusion None  Danger to Self  Current suicidal ideation? Denies  Self-Injurious Behavior No self-injurious ideation or behavior indicators observed or expressed   Agreement Not to Harm Self Yes  Description of Agreement verbal  Danger to Others  Danger to Others None reported or observed

## 2023-03-02 ENCOUNTER — Encounter (HOSPITAL_COMMUNITY): Payer: Self-pay

## 2023-03-02 DIAGNOSIS — F200 Paranoid schizophrenia: Secondary | ICD-10-CM | POA: Diagnosis not present

## 2023-03-02 NOTE — Progress Notes (Signed)
Patient ID: [name] Love, male   DOB: 1995-05-06, 27 y.o.   MRN: [id number] [name] MD Progress Note  03/02/2023 3:26 PM [name] Love  MRN:  [id number]  Reason for admission:   Pt is a 27 yo African American male with prior mental health diagnoses of P. Schizophrenia, Borderline Intellectual functioning, Cannabis use disorder who was transferred & admitted voluntarily to this [facility] on 12/02 after initially presenting to the [facility] on 11/29 with psychosis; Presented with disorganized thoughts, and making illogical statements, accompanied by his family members. As per documentation from the [facility]: "Pt states that he gets killed every time he states what's wrong. Pt states that his body talks to him, such as his butt. Pt denies SI, HI, AH and alcohol use at this present time. Pt endorses VH, stating that he sees letters and smoking synthetic herbs. Pt states that we can [name] Love help him by providing him with liquids and blood work."    Per staff patient reports that this morning admitted hallucination and suicidal ideation but per staff he is unable to elaborate any further, questionable given patient's IED patient's report is unreliable, to follow  Today's assessment notes: On assessment today, the pt reports that his mood is euthymic, improved since admission, and stable. Denies feeling down, depressed, or sad.  Patient remains alert and oriented x 3.  However speech remained tangential.  During this assessment, when asked about how he is doing reports, "I am doing fine.  I love God and I hope Jesus comes back to help me.  I love cheese.  I want to marry my sister.  Mom says get your snack and get in bed.  Not much into ice cream anymore."  This tangentiality appears to be baseline for patient.  No acute discomfort observed, and noted to be attending and participating in therapeutic milieu and unit group activities.  He denies SI, HI, or AVH. Reports that anxiety symptoms are at manageable level.  Sleep is  stable. Appetite is stable.  Concentration is without complaint.  Energy level is adequate. Denies having any suicidal thoughts. Denies having any suicidal intent and plan.  Denies having any HI.  Denies having psychotic symptoms.   Denies having side effects to current psychiatric medications.   Discussed discharge planning: How to identify the signs of impending crisis, use of internal coping strategies, reaching out to friends and family that can help navigate a crisis, and a list of mental health professionals and agencies to call. Further to follow up on her mental health appointments and her PCP appointments.  03/02/2023: Collateral information: Patient's mother [name] Love called at [phone] regarding discharge arrangements for tomorrow 03/03/2023.  Mother said that she is having a car problem, however, that she will find transportation to pick up the patient from [facility] hospital.  02/28/2023: Collateral information: Patient's mother [name] Love at [phone] called and made aware of patient's progress with medication regimen.  Mother reports that she saw patient on Sunday and he was not at his baseline.  Made mother aware that there is great improvement with patient's mood and response to psychotropic medication regimen.  However, she agrees with this provider and added that she is contacting ACT team today to make arrangements for patient discharge.  This provider informed patient's mother of possible discharge the on Wednesday, March 02, 2023.  She added that she would contact this provider again regarding her arrangement and after hearing from ACT team.  Principal Problem: Schizophrenia, paranoid type (HCC) Diagnosis: Principal Problem:  Schizophrenia, paranoid type (HCC) Active Problems:   Cannabis use disorder, severe, dependence (HCC)   Borderline intellectual functioning   Cocaine use disorder (HCC)  Total Time spent with patient: 35 minutes  Past Psychiatric  History:  Previous Psych Diagnoses: P. Schizophrenia  Prior inpatient treatment: Multiple times at this hospital on the C/A unit: 10/26/2023, 11/25/2014, 01/30/2025, 08/07/2015, 08/17/2015, 09/28/2015, and on the adult unit at this hospital on 03/23/2016, 03/22/2017. Current/prior outpatient treatment: [name] Love, [facility].  Prior rehab hx: Denies  Psychotherapy hx: [facility] History of suicide attempt: Denies  History of homicide or aggression: As per collateral information obtained by the [facility], pt has aggressive behaviors at home  Psychiatric medication history: Abilify LAI in the past, Ambien, Depakote, Cogentin, Haldol, Ambien in the past as per chart review. Psychiatric medication compliance history: non compliant as per chart review Neuromodulation history: Denies  Current Psychiatrist:[facility] Current therapist: Vesta Love   Past Medical History:  Past Medical History:  Diagnosis Date   Schizophrenia, acute (HCC)    History reviewed. No pertinent surgical history. Family History:  Family History  Problem Relation Age of Onset   Mental illness Cousin    Family Psychiatric  History: See H&P  Social History:  Social History   Substance and Sexual Activity  Alcohol Use Yes   Comment: a little      Social History   Substance and Sexual Activity  Drug Use No   Comment: denies    Social History   Socioeconomic History   Marital status: Single    Spouse name: Not on file   Number of children: Not on file   Years of education: Not on file   Highest education level: Not on file  Occupational History   Not on file  Tobacco Use   Smoking status: Every Day    Current packs/day: 0.00    Types: Cigarettes   Smokeless tobacco: Never  Substance and Sexual Activity   Alcohol use: Yes    Comment: a little    Drug use: No    Comment: denies   Sexual activity: Not Currently  Other Topics Concern   Not on file  Social History Narrative   Not on file    Social Drivers of Health   Financial Resource Strain: Not on file  Food Insecurity: Food Insecurity Present (02/14/2023)   Hunger Vital Sign    Worried About Running Out of Food in the Last Year: Sometimes true    Ran Out of Food in the Last Year: Sometimes true  Transportation Needs: Unmet Transportation Needs (02/14/2023)   PRAPARE - Administrator, Civil Service (Medical): Yes    Lack of Transportation (Non-Medical): Yes  Physical Activity: Not on file  Stress: Not on file  Social Connections: Not on file  Current Medications: Current Facility-Administered Medications  Medication Dose Route Frequency Provider Last Rate Last Admin   acetaminophen (TYLENOL) tablet 650 mg  650 mg Oral Q6H PRN Tomie China, MD   650 mg at 02/25/23 1144   alum & mag hydroxide-simeth (MAALOX/MYLANTA) 200-200-20 MG/5ML suspension 30 mL  30 mL Oral Q4H PRN Tomie China, MD       ARIPiprazole (ABILIFY) tablet 20 mg  20 mg Oral Daily Attiah, Nadir, MD   20 mg at 03/02/23 0758   benztropine (COGENTIN) tablet 1 mg  1 mg Oral BID Massengill, Harrold Donath, MD   1 mg at 03/02/23 0759   haloperidol (HALDOL) tablet 5 mg  5 mg Oral TID  PRN Phineas Inches, MD       And   diphenhydrAMINE (BENADRYL) capsule 50 mg  50 mg Oral TID PRN Massengill, Harrold Donath, MD       haloperidol lactate (HALDOL) injection 5 mg  5 mg Intramuscular TID PRN Massengill, Harrold Donath, MD       And   diphenhydrAMINE (BENADRYL) injection 50 mg  50 mg Intramuscular TID PRN Massengill, Harrold Donath, MD       And   LORazepam (ATIVAN) injection 2 mg  2 mg Intramuscular TID PRN Massengill, Harrold Donath, MD       haloperidol lactate (HALDOL) injection 10 mg  10 mg Intramuscular TID PRN Massengill, Harrold Donath, MD       And   diphenhydrAMINE (BENADRYL) injection 50 mg  50 mg Intramuscular TID PRN Massengill, Harrold Donath, MD       And   LORazepam (ATIVAN) injection 2 mg  2 mg Intramuscular TID PRN Massengill, Harrold Donath, MD       divalproex (DEPAKOTE ER) 24 hr  tablet 750 mg  750 mg Oral Q12H Jadavion Spoelstra C, FNP   750 mg at 03/02/23 0759   haloperidol (HALDOL) 2 MG/ML solution 15 mg  15 mg Oral QHS Massengill, Harrold Donath, MD   15 mg at 03/01/23 2026   haloperidol (HALDOL) 2 MG/ML solution 5 mg  5 mg Oral Q0600 Massengill, Harrold Donath, MD   5 mg at 03/02/23 0756   hydrOXYzine (ATARAX) tablet 25 mg  25 mg Oral TID PRN Tomie China, MD   25 mg at 03/01/23 2025   magnesium hydroxide (MILK OF MAGNESIA) suspension 30 mL  30 mL Oral Daily PRN Tomie China, MD       traZODone (DESYREL) tablet 50 mg  50 mg Oral QHS PRN Tomie China, MD   50 mg at 03/01/23 2025   Vitamin D (Ergocalciferol) (DRISDOL) 1.25 MG (50000 UNIT) capsule 50,000 Units  50,000 Units Oral Q7 days Starleen Blue, NP   50,000 Units at 02/23/23 2025   white petrolatum (VASELINE) gel   Topical PRN Starleen Blue, NP   1 Application at 02/25/23 4010   Lab Results:  No results found for this or any previous visit (from the past 48 hours).  Blood Alcohol level:  Lab Results  Component Value Date   ETH <10 02/11/2023   ETH <10 02/06/2023   Metabolic Disorder Labs: Lab Results  Component Value Date   HGBA1C 5.3 02/11/2023   MPG 105.41 02/11/2023   MPG 117 11/27/2014   Lab Results  Component Value Date   PROLACTIN 57.5 (H) 08/20/2015   PROLACTIN 23.2 (H) 02/04/2015   Lab Results  Component Value Date   CHOL 202 (H) 02/11/2023   TRIG 46 02/11/2023   HDL 83 02/11/2023   CHOLHDL 2.4 02/11/2023   VLDL 9 02/11/2023   LDLCALC 110 (H) 02/11/2023   LDLCALC 152 (H) 07/15/2015   Physical Findings: AIMS: Facial and Oral Movements Muscles of Facial Expression: None Lips and Perioral Area: None Jaw: None Tongue: None,Extremity Movements Upper (arms, wrists, hands, fingers): None Lower (legs, knees, ankles, toes): None, Trunk Movements Neck, shoulders, hips: None, Global Judgements Severity of abnormal movements overall : None Incapacitation due to abnormal movements:  None Patient's awareness of abnormal movements: No Awareness, Dental Status Current problems with teeth and/or dentures?: No Does patient usually wear dentures?: No Edentia?: No  CIWA:    COWS:     + pill-rolling tremor left upper extremity at rest No other appreciable tremor, rigidity, or cogwheeling appreciated on exam  on 12/8  Musculoskeletal: Strength & Muscle Tone: within normal limits Gait & Station: normal Patient leans: N/A  Psychiatric Specialty Exam:  Presentation  General Appearance:  Appropriate for Environment; Casual; Fairly Groomed  Eye Contact: Limited  Speech: Decreased amount but normal tone and volume  Speech Volume: Normal  Handedness: Right  Mood and Affect  Mood: Euthymic  Affect: Appropriate; Congruent  Thought Process  Thought Processes: Disorganized  Descriptions of Associations: Concrete Orientation:Full (Time, Place and Person)  Thought Content: Disorganized at times  History of Schizophrenia/Schizoaffective disorder:Yes  Duration of Psychotic Symptoms:Greater than six months  Hallucinations:Hallucinations: None Description of Auditory Hallucinations: None  Ideas of Reference:None  Suicidal Thoughts:Suicidal Thoughts: No  Denies passive or active SI intention or plan Homicidal Thoughts:Homicidal Thoughts: No  Denies HI Sensorium  Memory: Immediate Poor; Recent Poor  Judgment: Impaired  Insight: Poor  Executive Functions  Concentration: Fair  Attention Span: Fair  Recall: Fair  Fund of Knowledge: Poor  Language: Fair  Psychomotor Activity  Psychomotor Activity: Psychomotor Activity: Normal  Assets  Assets: Resilience; Physical Health  Sleep  Sleep: Sleep: Good Number of Hours of Sleep: 8.5  Physical Exam: Physical Exam Vitals and nursing note reviewed.  Constitutional:      General: He is not in acute distress.    Appearance: He is normal weight. He is not toxic-appearing.  HENT:      Head: Normocephalic.     Nose: Nose normal.     Mouth/Throat:     Mouth: Mucous membranes are moist.  Cardiovascular:     Rate and Rhythm: Normal rate.     Pulses: Normal pulses.  Pulmonary:     Effort: Pulmonary effort is normal. No respiratory distress.  Abdominal:     Comments: Deferred  Genitourinary:    Comments: Deferred Musculoskeletal:        General: Normal range of motion.     Cervical back: Normal range of motion.  Skin:    General: Skin is warm.  Neurological:     Mental Status: He is alert.     Motor: No weakness.     Gait: Gait normal.  Psychiatric:        Mood and Affect: Mood normal.        Behavior: Behavior normal.     Comments: Restless and tangential    Review of Systems  Constitutional:  Negative for chills and fever.  HENT:  Negative for sore throat.   Eyes:  Negative for blurred vision.  Respiratory:  Negative for cough, sputum production, shortness of breath and wheezing.   Cardiovascular:  Negative for chest pain and palpitations.  Gastrointestinal:  Negative for heartburn and nausea.  Genitourinary:  Negative for dysuria, frequency and urgency.  Musculoskeletal:  Negative for myalgias.  Skin:  Negative for itching and rash.  Neurological:  Negative for dizziness, tingling, tremors and headaches.  Endo/Heme/Allergies:        See allergy listing  Psychiatric/Behavioral:  Negative for depression, hallucinations, memory loss, substance abuse and suicidal ideas. The patient does not have insomnia.   All other systems reviewed and are negative.  Blood pressure 123/74, pulse 78, temperature 97.6 F (36.4 C), temperature source Oral, resp. rate 16, height 5\' 8"  (1.727 m), weight 74 kg, SpO2 100%. Body mass index is 24.81 kg/m.  Treatment Plan Summary: Daily contact with patient to assess and evaluate symptoms and progress in treatment and Medication management  ASSESSMENT:  Diagnoses / Active Problems: Schizophrenia, paranoid type (HCC)    Cannabis  use disorder, severe, dependence (HCC)   Borderline intellectual functioning   Cocaine use disorder (HCC)  PLAN: Safety and Monitoring:  --  Voluntary admission to inpatient psychiatric unit for safety, stabilization and treatment  -- Daily contact with patient to assess and evaluate symptoms and progress in treatment  -- Patient's case to be discussed in multi-disciplinary team meeting  -- Observation Level : q15 minute checks  -- Vital signs:  q12 hours  -- Precautions: suicide, elopement, and assault  2. Psychiatric Diagnoses and Treatment:   Continue Depakote 750 mg twice daily, Depakote level due on 12/16 to ensure therapeutic level and no toxicity   Continue liquid Haldol 5 mg in the morning and 15 mg at bedtime for schizophrenia   Continue Cogentin 1 mg twice for pill-rolling tremor.    Continue Abilify 20 mg daily to address refractory symptoms of psychosis and monitor.  Patient psychosis have been refractory requiring using 2 antipsychotics with positive response noted.   Continue vitamin D supplementation  Continue hydroxyzine as needed for anxiety and trazodone as needed and insomnia  --  The risks/benefits/side-effects/alternatives to this medication were discussed in detail with the patient and time was given for questions. The patient consents to medication trial.    3. Medical Issues Being Addressed: None at this time   4. Discharge Planning:   -- Social work and case management to assist with discharge planning and identification of hospital follow-up needs prior to discharge  -- Estimated LOS: 5-7 more days  -- Discharge Concerns: Need to establish a safety plan; Medication compliance and effectiveness  -- Discharge Goals: Return home with outpatient referrals for mental health follow-up including medication management/psychotherapy   Cecilie Lowers, FNP 03/02/2023, 3:26 PM  Patient ID: [name] Love, male   DOB: [birth year], 27 y.o.   MRN:  [id number] Patient ID: [name] Love, male   DOB: Mar 12, 1996, 27 y.o.   MRN: [id number] Patient ID: [name] Love, male   DOB: 14-Jan-1996, 27 y.o.   MRN: [id number] Patient ID: [name] Love, male   DOB: 1995/10/05, 27 y.o.   MRN: [id number] Patient ID: [name] Love, male   DOB: [birth year], 27 y.o.   MRN: [id number] Patient ID: [name] Love, male   DOB: 1995-06-13, 27 y.o.   MRN: [id number]

## 2023-03-02 NOTE — Progress Notes (Addendum)
Patient denies SI,HI. And endorses AH in the form of voices and "I can't hear myself think." Patient denies VH at first and then endorses them in the form of "scary aliens, hair on people's heads, food you didn't eat." Patient believes that "professionals are holding me back." Patient rates depression 5 and anxiety 7/ 10. Patient is calm and cooperative and present in the milieu throughout the shift. Patient often observed to be intensely staring at someone or something. Patient makes bizarre statements with loose associations. Patient is adherent to medications and no adverse reactions were noted. Safety was maintained throughout shift with q 15 minute safety checks.   03/02/23 1000  Psych Admission Type (Psych Patients Only)  Admission Status Voluntary  Psychosocial Assessment  Patient Complaints None  Eye Contact Fair  Facial Expression Flat  Affect Preoccupied  Speech Tangential  Interaction Assertive  Motor Activity Slow  Appearance/Hygiene Unremarkable  Behavior Characteristics Cooperative  Mood Anxious;Preoccupied  Thought Process  Coherency Loose associations;Disorganized  Content Preoccupation  Delusions None reported or observed  Perception WDL  Hallucination None reported or observed  Judgment Impaired  Confusion None  Danger to Self  Current suicidal ideation? Denies  Self-Injurious Behavior No self-injurious ideation or behavior indicators observed or expressed   Agreement Not to Harm Self Yes  Description of Agreement verbal  Danger to Others  Danger to Others None reported or observed

## 2023-03-02 NOTE — Progress Notes (Signed)
   03/02/23 0554  15 Minute Checks  Location Bedroom  Visual Appearance Calm  Behavior Sleeping  Sleep (Behavioral Health Patients Only)  Calculate sleep? (Click Yes once per 24 hr at 0600 safety check) Yes  Documented sleep last 24 hours 8.5

## 2023-03-02 NOTE — BH IP Treatment Plan (Signed)
Interdisciplinary Treatment and Diagnostic Plan Update  03/02/2023 Time of Session: 11:25AM - OSAZE KLEINFELDT Campi MRN: [id number]  Principal Diagnosis: Schizophrenia, paranoid type (HCC)  Secondary Diagnoses: Principal Problem:   Schizophrenia, paranoid type (HCC) Active Problems:   Cannabis use disorder, severe, dependence (HCC)   Borderline intellectual functioning   Cocaine use disorder (HCC)   Current Medications:  Current Facility-Administered Medications  Medication Dose Route Frequency Provider Last Rate Last Admin   acetaminophen (TYLENOL) tablet 650 mg  650 mg Oral Q6H PRN Tomie China, MD   650 mg at 02/25/23 1144   alum & mag hydroxide-simeth (MAALOX/MYLANTA) 200-200-20 MG/5ML suspension 30 mL  30 mL Oral Q4H PRN Tomie China, MD       ARIPiprazole (ABILIFY) tablet 20 mg  20 mg Oral Daily Abbott Pao, Nadir, MD   20 mg at 03/02/23 0758   benztropine (COGENTIN) tablet 1 mg  1 mg Oral BID Massengill, Harrold Donath, MD   1 mg at 03/02/23 0759   haloperidol (HALDOL) tablet 5 mg  5 mg Oral TID PRN Phineas Inches, MD       And   diphenhydrAMINE (BENADRYL) capsule 50 mg  50 mg Oral TID PRN Massengill, Harrold Donath, MD       haloperidol lactate (HALDOL) injection 5 mg  5 mg Intramuscular TID PRN Massengill, Harrold Donath, MD       And   diphenhydrAMINE (BENADRYL) injection 50 mg  50 mg Intramuscular TID PRN Massengill, Harrold Donath, MD       And   LORazepam (ATIVAN) injection 2 mg  2 mg Intramuscular TID PRN Massengill, Harrold Donath, MD       haloperidol lactate (HALDOL) injection 10 mg  10 mg Intramuscular TID PRN Massengill, Harrold Donath, MD       And   diphenhydrAMINE (BENADRYL) injection 50 mg  50 mg Intramuscular TID PRN Massengill, Harrold Donath, MD       And   LORazepam (ATIVAN) injection 2 mg  2 mg Intramuscular TID PRN Massengill, Harrold Donath, MD       divalproex (DEPAKOTE ER) 24 hr tablet 750 mg  750 mg Oral Q12H Ntuen, Tina C, FNP   750 mg at 03/02/23 0759   haloperidol (HALDOL) 2 MG/ML solution 15 mg  15  mg Oral QHS Massengill, Harrold Donath, MD   15 mg at 03/01/23 2026   haloperidol (HALDOL) 2 MG/ML solution 5 mg  5 mg Oral Q0600 Massengill, Harrold Donath, MD   5 mg at 03/02/23 0756   hydrOXYzine (ATARAX) tablet 25 mg  25 mg Oral TID PRN Tomie China, MD   25 mg at 03/01/23 2025   magnesium hydroxide (MILK OF MAGNESIA) suspension 30 mL  30 mL Oral Daily PRN Tomie China, MD       traZODone (DESYREL) tablet 50 mg  50 mg Oral QHS PRN Tomie China, MD   50 mg at 03/01/23 2025   Vitamin D (Ergocalciferol) (DRISDOL) 1.25 MG (50000 UNIT) capsule 50,000 Units  50,000 Units Oral Q7 days Starleen Blue, NP   50,000 Units at 02/23/23 2025   white petrolatum (VASELINE) gel   Topical PRN Starleen Blue, NP   1 Application at 02/25/23 7829   PTA Medications: Medications Prior to Admission  Medication Sig Dispense Refill Last Dose/Taking   divalproex (DEPAKOTE) 500 MG DR tablet Take 1 tablet (500 mg total) by mouth at bedtime.       Patient Stressors:    Patient Strengths: Supportive family/friends   Treatment Modalities: Medication Management, Group therapy, Case management,  1 to 1 session  with clinician, Psychoeducation, Recreational therapy.   Physician Treatment Plan for Primary Diagnosis: Schizophrenia, paranoid type (HCC) Long Term Goal(s): Improvement in symptoms so as ready for discharge   Short Term Goals: Ability to identify changes in lifestyle to reduce recurrence of condition will improve Ability to verbalize feelings will improve Ability to disclose and discuss suicidal ideas Ability to demonstrate self-control will improve Ability to identify and develop effective coping behaviors will improve Ability to maintain clinical measurements within normal limits will improve Compliance with prescribed medications will improve Ability to identify triggers associated with substance abuse/mental health issues will improve  Medication Management: Evaluate patient's response, side  effects, and tolerance of medication regimen.  Therapeutic Interventions: 1 to 1 sessions, Unit Group sessions and Medication administration.  Evaluation of Outcomes: Progressing  Physician Treatment Plan for Secondary Diagnosis: Principal Problem:   Schizophrenia, paranoid type (HCC) Active Problems:   Cannabis use disorder, severe, dependence (HCC)   Borderline intellectual functioning   Cocaine use disorder (HCC)  Long Term Goal(s): Improvement in symptoms so as ready for discharge   Short Term Goals: Ability to identify changes in lifestyle to reduce recurrence of condition will improve Ability to verbalize feelings will improve Ability to disclose and discuss suicidal ideas Ability to demonstrate self-control will improve Ability to identify and develop effective coping behaviors will improve Ability to maintain clinical measurements within normal limits will improve Compliance with prescribed medications will improve Ability to identify triggers associated with substance abuse/mental health issues will improve     Medication Management: Evaluate patient's response, side effects, and tolerance of medication regimen.  Therapeutic Interventions: 1 to 1 sessions, Unit Group sessions and Medication administration.  Evaluation of Outcomes: Progressing   RN Treatment Plan for Primary Diagnosis: Schizophrenia, paranoid type (HCC) Long Term Goal(s): Knowledge of disease and therapeutic regimen to maintain health will improve  Short Term Goals: Ability to remain free from injury will improve, Ability to verbalize frustration and anger appropriately will improve, Ability to participate in decision making will improve, Ability to verbalize feelings will improve, Ability to identify and develop effective coping behaviors will improve, and Compliance with prescribed medications will improve  Medication Management: RN will administer medications as ordered by provider, will assess and  evaluate patient's response and provide education to patient for prescribed medication. RN will report any adverse and/or side effects to prescribing provider.  Therapeutic Interventions: 1 on 1 counseling sessions, Psychoeducation, Medication administration, Evaluate responses to treatment, Monitor vital signs and CBGs as ordered, Perform/monitor CIWA, COWS, AIMS and Fall Risk screenings as ordered, Perform wound care treatments as ordered.  Evaluation of Outcomes: Progressing   LCSW Treatment Plan for Primary Diagnosis: Schizophrenia, paranoid type (HCC) Long Term Goal(s): Safe transition to appropriate next level of care at discharge, Engage patient in therapeutic group addressing interpersonal concerns.  Short Term Goals: Engage patient in aftercare planning with referrals and resources, Increase social support, Facilitate acceptance of mental health diagnosis and concerns, Facilitate patient progression through stages of change regarding substance use diagnoses and concerns, and Identify triggers associated with mental health/substance abuse issues  Therapeutic Interventions: Assess for all discharge needs, 1 to 1 time with Social worker, Explore available resources and support systems, Assess for adequacy in community support network, Educate family and significant other(s) on suicide prevention, Complete Psychosocial Assessment, Interpersonal group therapy.  Evaluation of Outcomes: Progressing   Progress in Treatment: Attending groups: Yes. Participating in groups: Yes. Taking medication as prescribed: Yes. Toleration medication: Yes. Family/Significant other contact made:  Yes, individual(s) contacted:  [name], mother [phone] Patient understands diagnosis: Yes. Discussing patient identified problems/goals with staff: Yes. Medical problems stabilized or resolved: Yes. Denies suicidal/homicidal ideation: Yes. Issues/concerns per patient self-inventory: No. Other: na    New problem(s) identified: No, Describe:  none reported   New Short Term/Long Term Goal(s): medication stabilization, elimination of SI thoughts, development of comprehensive mental wellness plan.      Patient Goals:  " I would like to stay here for a long time and improve my behavior- I need a safe a place for a long time"   Discharge Plan or Barriers:  Patient recently admitted. CSW will continue to follow and assess for appropriate referrals and possible discharge planning.      Reason for Continuation of Hospitalization: Aggression Delusions  Hallucinations Suicidal ideation   Estimated Length of Stay: 2-3 days  Last 3 [place] Suicide Severity Risk Score: Flowsheet Row Ad[facility] (Current) [facility] from 02/12/2023 in [facility] ED from 02/11/2023 in [facility]  C-SSRS RISK CATEGORY No Risk No Risk No Risk       Last [facility] 2/9 Scores:     No data to display          Scribe for Treatment Team: Kathi Der, Theresia Majors 03/02/2023 12:50 PM

## 2023-03-02 NOTE — Group Note (Signed)
Date:  03/02/2023 Time:  8:45 PM  Group Topic/Focus:  Wrap-Up Group:   The focus of this group is to help patients review their daily goal of treatment and discuss progress on daily workbooks.    Participation Level:  Active  Participation Quality:  Appropriate  Affect:  Appropriate  Cognitive:  Appropriate  Insight: Appropriate  Engagement in Group:  Limited  Modes of Intervention:  Discussion  Additional Comments:  Pt stated his goal for today was to focus on his treatment plan. Pt stated he accomplished his goal today. Pt stated he talked with his doctor and social worker about his care today. Pt rated his overall day a 2 out of 10. Pt stated he was able to contact his mother today which improved his day. Pt stated he felt better about himself today. Pt stated he was able to attend all meals. Pt stated he took all medications provided today. Pt stated he attend all groups held today. Pt stated his appetite was pretty good today. Pt rated sleep last night was pretty good. Pt stated the goal tonight was to get some rest. Pt stated he had no physical pain tonight. Pt deny visual hallucinations and auditory issues tonight. Pt denies thoughts of harming himself or others. Pt stated he would alert staff if anything change.   Felipa Furnace 03/02/2023, 8:45 PM

## 2023-03-02 NOTE — Progress Notes (Signed)
   03/02/23 2137  Psych Admission Type (Psych Patients Only)  Admission Status Voluntary  Psychosocial Assessment  Patient Complaints Suspiciousness  Eye Contact Brief  Facial Expression Sad  Affect Preoccupied  Speech Tangential  Interaction Assertive  Motor Activity Slow  Appearance/Hygiene Disheveled  Behavior Characteristics Cooperative  Mood Preoccupied  Thought Process  Coherency Disorganized  Content Preoccupation  Delusions None reported or observed  Perception Hallucinations  Hallucination Auditory  Judgment Limited  Confusion Mild  Danger to Self  Current suicidal ideation? Denies  Self-Injurious Behavior No self-injurious ideation or behavior indicators observed or expressed   Agreement Not to Harm Self Yes  Description of Agreement verbal contract for safety  Danger to Others  Danger to Others None reported or observed   D: Patient is calm, cooperative and visible on the milieu. Pt reports he had a good. Pt endorses AH but "can not make it out". Pt does appear bizarre in behavior while responding to internal stimuli. A: Medications administered as prescribed. Support and encouragement provided as needed.  R: Patient remains safe on the unit. Plan of care ongoing for safety and stability.

## 2023-03-02 NOTE — Plan of Care (Signed)
  Problem: Education: Goal: Emotional status will improve Outcome: Progressing Goal: Verbalization of understanding the information provided will improve Outcome: Progressing   Problem: Activity: Goal: Sleeping patterns will improve Outcome: Progressing   Problem: Health Behavior/Discharge Planning: Goal: Identification of resources available to assist in meeting health care needs will improve Outcome: Progressing

## 2023-03-02 NOTE — Group Note (Signed)
Recreation Therapy Group Note   Group Topic:Self-Esteem  Group Date: 03/02/2023 Start Time: 1010 End Time: 1055 Facilitators: Genise Strack-McCall, LRT,CTRS Location: [address] Dayroom   Group Topic: Self-esteem  Goal Area(s) Addresses:  Patient will identify and write at least one positive trait about themself. Patient will acknowledge the benefit of healthy self-esteem. Patient will endorse understanding of ways to increase self-esteem.   Intervention: Personalized Plate- printed license plate template, markers or colored pencils   Group Description: LRT began group session with open dialogue asking the patients to define self-esteem and verbally identify positive qualities and traits people may possess. Patients were then instructed to design a personalized license plate, with words and drawings, representing at least 3 positive things about themselves. Pts were encouraged to include favorites, things they are proud of, what they enjoy doing, and dreams for their future. If a patient had a life motto or a meaningful phase that expressed their life values, pt's were asked to incorporate that into their design as well. Patients were given the opportunity to share their completed work with the group.  Education: Healthy self-esteem, Positive character traits, Accepting compliments, Leisure as competence and coping, Support Systems, Discharge planning LRT educated patients on the importance of healthy self-esteem and ways to build self-esteem.   Education Outcome: Acknowledges education/In group clarification offered    Affect/Mood: Appropriate   Participation Level: Moderate   Participation Quality: Minimal Cues   Behavior: Distracted   Speech/Thought Process: Delusional   Insight: Lacking   Judgement: Lacking    Modes of Intervention: Art   Patient Response to Interventions:  Receptive   Education Outcome:  In group clarification offered    Clinical  Observations/Individualized Feedback: Pt began activity by making a paper airplane out of the diagram. Pt was redirected and turned his attention to completing his license plate. Pt had some bizarre things on his license plate. Pt put "[employer]" on his plate and drew a picture he described as a roach headed to [place].     Plan: Continue to engage patient in RT group sessions 2-3x/week.   Kamber Vignola-McCall, LRT,CTRS 03/02/2023 11:30 AM

## 2023-03-03 ENCOUNTER — Telehealth (HOSPITAL_COMMUNITY): Payer: Self-pay

## 2023-03-03 ENCOUNTER — Other Ambulatory Visit (HOSPITAL_COMMUNITY): Payer: Self-pay

## 2023-03-03 DIAGNOSIS — F200 Paranoid schizophrenia: Secondary | ICD-10-CM | POA: Diagnosis not present

## 2023-03-03 MED ORDER — HALOPERIDOL LACTATE 2 MG/ML PO CONC: 5.0000 mg | Freq: Every day | ORAL | 0 refills | Status: DC

## 2023-03-03 MED ORDER — VITAMIN D (ERGOCALCIFEROL) 1.25 MG (50000 UNIT) PO CAPS: 50000.0000 [IU] | ORAL_CAPSULE | ORAL | 0 refills | Status: DC

## 2023-03-03 MED ORDER — ARIPIPRAZOLE ER 400 MG IM SRER: 400.0000 mg | INTRAMUSCULAR | 0 refills | Status: DC

## 2023-03-03 MED ORDER — HYDROXYZINE HCL 25 MG PO TABS: 25.0000 mg | ORAL_TABLET | Freq: Three times a day (TID) | ORAL | 0 refills | Status: DC | PRN

## 2023-03-03 MED ORDER — ARIPIPRAZOLE 20 MG PO TABS: 20.0000 mg | ORAL_TABLET | Freq: Every day | ORAL | 0 refills | Status: DC

## 2023-03-03 MED ORDER — ARIPIPRAZOLE ER 400 MG IM SRER: 400.0000 mg | INTRAMUSCULAR | 0 refills | Status: AC

## 2023-03-03 MED ORDER — VITAMIN D (ERGOCALCIFEROL) 1.25 MG (50000 UNIT) PO CAPS: 50000.0000 [IU] | ORAL_CAPSULE | ORAL | 0 refills | Status: AC

## 2023-03-03 MED ORDER — ARIPIPRAZOLE 10 MG PO TABS: 10.0000 mg | ORAL_TABLET | Freq: Every day | ORAL | 0 refills | Status: AC

## 2023-03-03 MED ORDER — HALOPERIDOL 20 MG PO TABS: 20.0000 mg | ORAL_TABLET | Freq: Every day | ORAL | 0 refills | Status: AC

## 2023-03-03 MED ORDER — ARIPIPRAZOLE ER 400 MG IM SRER
400.0000 mg | INTRAMUSCULAR | Status: DC
  Administered 2023-03-03: 400 mg via INTRAMUSCULAR
  Filled 2023-03-03: qty 2

## 2023-03-03 MED ORDER — TRAZODONE HCL 50 MG PO TABS: 50.0000 mg | ORAL_TABLET | Freq: Every evening | ORAL | 0 refills | Status: AC | PRN

## 2023-03-03 MED ORDER — TRAZODONE HCL 50 MG PO TABS: 50.0000 mg | ORAL_TABLET | Freq: Every evening | ORAL | 0 refills | Status: DC | PRN

## 2023-03-03 MED ORDER — ARIPIPRAZOLE 10 MG PO TABS
10.0000 mg | ORAL_TABLET | Freq: Every day | ORAL | Status: DC
  Filled 2023-03-03 (×2): qty 20
  Filled 2023-03-03: qty 1

## 2023-03-03 MED ORDER — BENZTROPINE MESYLATE 1 MG PO TABS: 1.0000 mg | ORAL_TABLET | Freq: Two times a day (BID) | ORAL | 0 refills | Status: DC

## 2023-03-03 MED ORDER — HALOPERIDOL LACTATE 2 MG/ML PO CONC: 15.0000 mg | Freq: Every day | ORAL | 0 refills | Status: DC

## 2023-03-03 MED ORDER — BENZTROPINE MESYLATE 1 MG PO TABS: 1.0000 mg | ORAL_TABLET | Freq: Two times a day (BID) | ORAL | 0 refills | Status: AC

## 2023-03-03 MED ORDER — HALOPERIDOL 5 MG PO TABS
20.0000 mg | ORAL_TABLET | Freq: Every day | ORAL | Status: DC
  Filled 2023-03-03: qty 28

## 2023-03-03 MED ORDER — HYDROXYZINE HCL 25 MG PO TABS: 25.0000 mg | ORAL_TABLET | Freq: Three times a day (TID) | ORAL | 0 refills | Status: AC | PRN

## 2023-03-03 MED ORDER — DIVALPROEX SODIUM ER 250 MG PO TB24: 750.0000 mg | ORAL_TABLET | Freq: Two times a day (BID) | ORAL | 0 refills | Status: DC

## 2023-03-03 MED ORDER — DIVALPROEX SODIUM ER 250 MG PO TB24: 750.0000 mg | ORAL_TABLET | Freq: Two times a day (BID) | ORAL | 0 refills | Status: AC

## 2023-03-03 NOTE — [facility] Group Notes (Signed)
Pt attended [facility] Group facilitated by Tammy.

## 2023-03-03 NOTE — [facility] Group Notes (Signed)
Adult Psychoeducational Group Note  Date:  03/03/2023 Time:  10:48 AM  Group Topic/Focus:  Goals Group:   The focus of this group is to help patients establish daily goals to achieve during treatment and discuss how the patient can incorporate goal setting into their daily lives to aide in recovery.  Participation Level:  Active  Participation Quality:  Appropriate  Affect:  Appropriate  Cognitive:  Alert  Insight: Appropriate  Engagement in Group:  Engaged  Modes of Intervention:  Orientation  Additional Comments:  Pt goal for today is go home and do well once he's released from the hopital.  Dellia Nims 03/03/2023, 10:48 AM

## 2023-03-03 NOTE — Plan of Care (Signed)
  Problem: Education: Goal: Mental status will improve Outcome: Not Progressing Goal: Verbalization of understanding the information provided will improve Outcome: Progressing

## 2023-03-03 NOTE — Discharge Summary (Signed)
Physician Discharge Summary Note  [name] is an 27 y.o., male MRN:  [id number] DOB:  06/15/1995 Patient phone:  [phone] (home)  Patient address:   [address] [place] [address],  Total Time spent with patient: 30 minutes  Date of Admission:  02/14/2023 Date of Discharge:   03/03/2023  Reason for Admission:   Pt is a 27 yo [place] American male with prior mental health diagnoses of P. Schizophrenia, Borderline Intellectual functioning, Cannabis use disorder who was transferred & admitted voluntarily to this [facility] on 12/02 after initially presenting to the [facility] on 11/29 with psychosis; Presented with disorganized thoughts, and making illogical statements, accompanied by his family members.   Principal Problem: Schizophrenia, paranoid type [facility]) Discharge Diagnoses: Principal Problem:   Schizophrenia, paranoid type (HCC) Active Problems:   Cannabis use disorder, severe, dependence (HCC)   Borderline intellectual functioning   Cocaine use disorder (HCC)  Past Psychiatric History: Previous Psych Diagnoses: P. Schizophrenia  Prior inpatient treatment: Multiple times at this hospital on the C/A unit: 10/26/2023, 11/25/2014, 01/30/2025, 08/07/2015, 08/17/2015, 09/28/2015, and on the adult unit at this hospital on 03/23/2016, 03/22/2017. Current/prior outpatient treatment: Vesta Mixer, [facility].  Prior rehab hx: Denies  Psychotherapy hx: [facility] History of suicide attempt: Denies  History of homicide or aggression: As per collateral information obtained by the [facility], pt has aggressive behaviors at home  Psychiatric medication history: Abilify LAI in the past, Ambien, Depakote, Cogentin, Haldol, Ambien in the past as per chart review. Psychiatric medication compliance history: non compliant as per chart review Neuromodulation history: Denies  Current Psychiatrist:[facility] Current therapist: Vesta Mixer  Past Medical History:  Past Medical  History:  Diagnosis Date   Schizophrenia, acute (HCC)    History reviewed. No pertinent surgical history. Family History:  Family History  Problem Relation Age of Onset   Mental illness Cousin    Family Psychiatric  History: See H&P Social History:  Social History   Substance and Sexual Activity  Alcohol Use Yes   Comment: a little      Social History   Substance and Sexual Activity  Drug Use No   Comment: denies    Social History   Socioeconomic History   Marital status: Single    Spouse name: Not on file   Number of children: Not on file   Years of education: Not on file   Highest education level: Not on file  Occupational History   Not on file  Tobacco Use   Smoking status: Every Day    Current packs/day: 0.00    Types: Cigarettes   Smokeless tobacco: Never  Substance and Sexual Activity   Alcohol use: Yes    Comment: a little    Drug use: No    Comment: denies   Sexual activity: Not Currently  Other Topics Concern   Not on file  Social History Narrative   Not on file   Social Drivers of Health   Financial Resource Strain: Not on file  Food Insecurity: Food Insecurity Present (02/14/2023)   Hunger Vital Sign    Worried About Running Out of Food in the Last Year: Sometimes true    Ran Out of Food in the Last Year: Sometimes true  Transportation Needs: Unmet Transportation Needs (02/14/2023)   PRAPARE - Administrator, Civil Service (Medical): Yes    Lack of Transportation (Non-Medical): Yes  Physical Activity: Not on file  Stress: Not on file  Social Connections: Not  on file   Hospital Course:  During the patient's hospitalization, patient had extensive initial psychiatric evaluation, and follow-up psychiatric evaluations every day.  Psychiatric diagnoses provided upon initial assessment:  Diagnosis:  Principal Problem:   Schizophrenia, paranoid type (HCC) Active Problems:   Cannabis use disorder, severe, dependence (HCC)   Borderline  intellectual functioning   Cocaine use disorder (HCC)  Patient's psychiatric medications were adjusted on admission:  -Start Haldol 5 mg BID for psychosis  -Change Depakote to 750 mg nightly for mood stabilization -Start Cogentin 0.5 mg BID for EPS prophylaxis  During the hospitalization, other adjustments were made to the patient's psychiatric medication regimen:  Haldol was changed to liquid form 2 mg/mL 50 mg nightly 5 mg every morning for psychosis Depakote was increased To 750 mg p.o. every 12 hours for mood stabilization Cogentin was increased to 1 mg p.o. twice daily for EPS prevention  Patient's care was discussed during the interdisciplinary team meeting every day during the hospitalization.  The patient denies having side effects to prescribed psychiatric medication.  Gradually, patient started adjusting to milieu. The patient was evaluated each day by a clinical provider to ascertain response to treatment. Improvement was noted by the patient's report of decreasing symptoms, improved sleep and appetite, affect, medication tolerance, behavior, and participation in unit programming.  Patient was asked each day to complete a self inventory noting mood, mental status, pain, new symptoms, anxiety and concerns.    Symptoms were reported as significantly decreased or resolved completely by discharge.   On day of discharge, the patient reports that their mood is stable. The patient denied having suicidal thoughts for more than 48 hours prior to discharge.  Patient denies having homicidal thoughts.  Patient denies having auditory hallucinations.  Patient denies any visual hallucinations or other symptoms of psychosis. The patient was motivated to continue taking medication with a goal of continued improvement in mental health.   The patient reports their target psychiatric symptoms of psychosis responded well to the psychiatric medications, and the patient reports overall benefit other  psychiatric hospitalization. Supportive psychotherapy was provided to the patient. The patient also participated in regular group therapy while hospitalized. Coping skills, problem solving as well as relaxation therapies were also part of the unit programming.  Labs were reviewed with the patient, and abnormal results were discussed with the patient.  The patient is able to verbalize their individual safety plan to this provider.  # It is recommended to the patient to continue psychiatric medications as prescribed, after discharge from the hospital.    # It is recommended to the patient to follow up with your outpatient psychiatric provider and PCP.  # It was discussed with the patient, the impact of alcohol, drugs, tobacco have been there overall psychiatric and medical wellbeing, and total abstinence from substance use was recommended the patient.ed.  # Prescriptions provided or sent directly to preferred pharmacy at discharge. Patient agreeable to plan. Given opportunity to ask questions. Appears to feel comfortable with discharge.    # In the event of worsening symptoms, the patient is instructed to call the crisis hotline, 911 and or go to the nearest ED for appropriate evaluation and treatment of symptoms. To follow-up with primary care provider for other medical issues, concerns and or health care needs  # Patient was discharged to home with a plan to follow up as noted below.   Addendum: Patient is discharged on 2 antipsychotic medications of Abilify maintena 400 mg q. 28 days and  Haldol 15 mg nightly and 5 mg every morning due to refractory psychosis  Physical Findings: AIMS: Facial and Oral Movements Muscles of Facial Expression: None Lips and Perioral Area: None Jaw: None Tongue: None,Extremity Movements Upper (arms, wrists, hands, fingers): None Lower (legs, knees, ankles, toes): None, Trunk Movements Neck, shoulders, hips: None, Global Judgements Severity of abnormal  movements overall : None Incapacitation due to abnormal movements: None Patient's awareness of abnormal movements: No Awareness, Dental Status Current problems with teeth and/or dentures?: No Does patient usually wear dentures?: No Edentia?: No  CIWA:    COWS:     Musculoskeletal: Strength & Muscle Tone: within normal limits Gait & Station: normal Patient leans: N/A  Psychiatric Specialty Exam:  Presentation  General Appearance:  Appropriate for Environment; Casual; Fairly Groomed  Eye Contact: Fair  Speech: Clear and Coherent  Speech Volume: Normal  Handedness: Right  Mood and Affect  Mood: Euthymic  Affect: Appropriate; Congruent  Thought Process  Thought Processes: Disorganized  Descriptions of Associations:Tangential  Orientation:Full (Time, Place and Person)  Thought Content:Tangential  History of Schizophrenia/Schizoaffective disorder:Yes  Duration of Psychotic Symptoms:Greater than six months  Hallucinations:Hallucinations: None Description of Auditory Hallucinations: None  Ideas of Reference:None  Suicidal Thoughts:Suicidal Thoughts: No  Homicidal Thoughts:Homicidal Thoughts: No  Sensorium  Memory: Immediate Poor; Recent Poor  Judgment: Impaired  Insight: Poor  Executive Functions  Concentration: Fair  Attention Span: Fair  Recall: Fair  Fund of Knowledge: Poor  Language: Fair  Psychomotor Activity  Psychomotor Activity: Psychomotor Activity: Normal  Assets  Assets: Resilience; Physical Health  Sleep  Sleep: Sleep: Good Number of Hours of Sleep: 8.5  Physical Exam: Physical Exam Vitals and nursing note reviewed.  HENT:     Head: Normocephalic.     Nose: Nose normal.     Mouth/Throat:     Mouth: Mucous membranes are moist.     Pharynx: Oropharynx is clear.  Eyes:     Extraocular Movements: Extraocular movements intact.  Cardiovascular:     Rate and Rhythm: Normal rate.     Pulses: Normal pulses.   Pulmonary:     Effort: Pulmonary effort is normal.  Abdominal:     Comments: Deferred  Genitourinary:    Comments: Deferred Musculoskeletal:        General: Normal range of motion.  Skin:    General: Skin is warm.  Neurological:     General: No focal deficit present.     Mental Status: He is alert and oriented to person, place, and time. Mental status is at baseline.  Psychiatric:        Mood and Affect: Mood normal.        Behavior: Behavior normal.    Review of Systems  Constitutional:  Negative for chills and fever.  HENT:  Negative for sore throat.   Eyes:  Negative for blurred vision.  Respiratory:  Negative for cough, sputum production, shortness of breath and wheezing.   Cardiovascular:  Negative for chest pain and palpitations.  Gastrointestinal:  Negative for abdominal pain, constipation, diarrhea, heartburn, nausea and vomiting.  Genitourinary:  Negative for dysuria, frequency and urgency.  Musculoskeletal:  Negative for falls.  Skin:  Negative for itching and rash.  Neurological:  Negative for dizziness, tingling, tremors and headaches.  Endo/Heme/Allergies:        See allergy listing  Psychiatric/Behavioral:  Negative for depression, hallucinations and suicidal ideas. The patient is not nervous/anxious and does not have insomnia.    Blood pressure (!) 140/86, pulse 86,  temperature 97.6 F (36.4 C), temperature source Oral, resp. rate 16, height 5\' 8"  (1.727 m), weight 74 kg, SpO2 100%. Body mass index is 24.81 kg/m.   Social History   Tobacco Use  Smoking Status Every Day   Current packs/day: 0.00   Types: Cigarettes  Smokeless Tobacco Never   Tobacco Cessation:  N/A, patient does not currently use tobacco products   Blood Alcohol level:  Lab Results  Component Value Date   ETH <10 02/11/2023   ETH <10 02/06/2023    Metabolic Disorder Labs:  Lab Results  Component Value Date   HGBA1C 5.3 02/11/2023   MPG 105.41 02/11/2023   MPG 117 11/27/2014    Lab Results  Component Value Date   PROLACTIN 57.5 (H) 08/20/2015   PROLACTIN 23.2 (H) 02/04/2015   Lab Results  Component Value Date   CHOL 202 (H) 02/11/2023   TRIG 46 02/11/2023   HDL 83 02/11/2023   CHOLHDL 2.4 02/11/2023   VLDL 9 02/11/2023   LDLCALC 110 (H) 02/11/2023   LDLCALC 152 (H) 07/15/2015    See Psychiatric Specialty Exam and Suicide Risk Assessment completed by Attending Physician prior to discharge.  Discharge destination:  Home  Is patient on multiple antipsychotic therapies at discharge:  Yes,   Do you recommend tapering to monotherapy for antipsychotics?  Yes   Has Patient had three or more failed trials of antipsychotic monotherapy by history:  No  Recommended Plan for Multiple Antipsychotic Therapies: Patient is discharged on 2 antipsychotic medications of Abilify maintena 400 mg q. 28 days and Haldol 15 mg nightly and 5 mg every morning due to refractory psychosis   Follow-up Information     [facility]. Call.   Why: You may call this provider for a hospital follow up appointment, to obtain therapy and medication management services. Contact information: [address]  [address] [place] [address] [phone]         [facility], [facility]. Call.   Why: Please follow up with this provider for ACTT services. Contact information: [address] [place] [address] [phone]                Follow-up recommendations:   Discharge Recommendations:  The patient is being discharged to home. Patient is to take his discharge medications as ordered.  See follow up above. We recommend that he participates in individual therapy to target uncontrollable agitation and substance abuse.  We recommend that he participates in therapy to target personal conflict, to improve communication skills and conflict resolution skills. Patient is to initiate/implement a contingency based behavioral model to address his behavior. We recommend  that he gets AIMS scale, height, weight, blood pressure, fasting lipid panel, fasting blood sugar in three months from discharge if he's on atypical antipsychotics.  Patient will benefit from monitoring of recurrent suicidal ideation since patient is on antidepressant medication. The patient should abstain from all illicit substances and alcohol. If the patient's symptoms worsen or do not continue to improve or if the patient becomes actively suicidal or homicidal then it is recommended that the patient return to the closest hospital emergency room or call 911 for further evaluation and treatment. National Suicide Prevention Lifeline 1800-SUICIDE or 438 407 1488. Please follow up with your primary medical doctor for all other medical needs.  The patient has been educated on the possible side effects to medications and she/her guardian is to contact a medical professional and inform outpatient provider of any new side effects of  medication. He is to take regular diet and activity as tolerated.  Will benefit from moderate daily exercise. Patient and Family was educated about removing/locking any firearms, medications or dangerous products from the home.  Activity:  As tolerated Diet:  Regular Diet   Signed: Cecilie Lowers, FNP 03/03/2023, 10:48 AM

## 2023-03-03 NOTE — Transportation (Signed)
03/03/2023  [name] Love DOB: [birth year] MRN: [id number]   RIDER WAIVER AND RELEASE OF LIABILITY  For the purposes of helping with transportation needs, [facility] partners with outside transportation providers (taxi companies, [place], Catering manager.) to give [employer] patients or other approved people the choice of on-demand rides [employer]") to our buildings for non-emergency visits.  By using [employer], I, the person signing this document, on behalf of myself and/or any legal minors (in my care using the [employer]), agree:  Science writer given to me are supplied by independent, outside transportation providers who do not work for, or have any affiliation with, [employer]. [place] is not a transportation company. [facility] has no control over the quality or safety of the rides I get using [employer]. [place] has no control over whether any outside ride will happen on time or not. [place] [facility] gives no guarantee on the reliability, quality, safety, or availability on any rides, or that no mistakes will happen. I know and accept that traveling by vehicle (car, truck, SVU, Zenaida Niece, bus, taxi, etc.) has risks of serious injuries such as disability, being paralyzed, and death. I know and agree the risk of using [employer] is mine alone, and not [employer]. Transport Services are provided "as is" and as are available. The transportation providers are in charge for all inspections and care of the vehicles used to provide these rides. I agree not to take legal action against [facility], its agents, employees, officers, directors, representatives, insurers, attorneys, assigns, successors, subsidiaries, and affiliates at any time for any reasons related directly or indirectly to using [employer]. I also agree not to take legal action against [place] or its affiliates for any injury, death, or damage to property caused by or related to using  [employer]. I have read this Waiver and Release of Liability, and I understand the terms used in it and their legal meaning. This Waiver is freely and voluntarily given with the understanding that my right (or any legal minors) to legal action against [place] relating to [employer] is knowingly given up to use these services.   I attest that I read the Ride Waiver and Release of Liability to [name], gave [name] the opportunity to ask questions and answered the questions asked (if any). I affirm that [name] then provided consent for assistance with transportation.

## 2023-03-03 NOTE — Discharge Instructions (Addendum)
-  Follow-up with your outpatient psychiatric provider -instructions on appointment date, time, and address (location) are provided to you in discharge paperwork.  -Take your psychiatric medications as prescribed at discharge - instructions are provided to you in the discharge paperwork As we discussed, we will provide printed prescriptions at discharge - take these to the pharmacy of your choosing to have your medications filled.  You received abilify maintena 400 mg IM LAI on 03-03-2027. Your next dose of the LAI is due on 03-31-2023. Pickup this prescription from your pharmacy and take it to your outpatient appointment to be administerred on or around 03-31-2023. Now that you are on the LAI, your dose of abilify was reduced from 20 gm to 10 mg once daily, and you will continue to take this for 10 more days, then stop the oral abilify medication. Your depakote level was 72 on 02-28-2024. Your outpatient doctor will monitor this medication and level.   -Follow-up with outpatient primary care doctor and other specialists -for management of preventative medicine and any chronic medical disease.  -Recommend abstinence from alcohol, tobacco, and other illicit drug use at discharge.   -If your psychiatric symptoms recur, worsen, or if you have side effects to your psychiatric medications, call your outpatient psychiatric provider, 911, [phone] or go to the nearest emergency department.  -If suicidal thoughts occur, call your outpatient psychiatric provider, 911, [phone] or go to the nearest emergency department.  Naloxone (Narcan) can help reverse an overdose when given to the victim quickly.  [facility] offers free naloxone kits and instructions/training on its use.  Add naloxone to your first aid kit and you can help save a life.   Pick up your free kit at the following locations:   [place]:  [facility], [address] [place] [address]  [phone]) [facility] [address] [place] [address] [phone]) [facility] [address] [place] [address]  [place]: [facility] of [facility] [address] [place] [address] ([phone]) [facility] [address] [place] [address] [phone])

## 2023-03-03 NOTE — [facility] Group Notes (Signed)
Pt attended nutrition group. 
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
## 2023-03-03 NOTE — BHH Suicide Risk Assessment (Signed)
Suicide Risk Assessment  Discharge Assessment    [facility] Discharge Suicide Risk Assessment   Principal Problem: Schizophrenia, paranoid type [facility]) Discharge Diagnoses: Principal Problem:   Schizophrenia, paranoid type (HCC) Active Problems:   Cannabis use disorder, severe, dependence (HCC)   Borderline intellectual functioning   Cocaine use disorder (HCC)  Reason for admission:  Pt is a 27 yo [place] American male with prior mental health diagnoses of P. Schizophrenia, Borderline Intellectual functioning, Cannabis use disorder who was transferred & admitted voluntarily to this [facility] on 12/02 after initially presenting to the [facility] on 11/29 with psychosis; Presented with disorganized thoughts, and making illogical statements, accompanied by his family members.   Total Time spent with patient: 30 minutes  Musculoskeletal: Strength & Muscle Tone: within normal limits Gait & Station: normal Patient leans: N/A  Psychiatric Specialty Exam  Presentation  General Appearance:  Appropriate for Environment; Casual; Fairly Groomed  Eye Contact: Fair  Speech: Clear and Coherent  Speech Volume: Normal  Handedness: Right  Mood and Affect  Mood: Euthymic  Duration of Depression Symptoms: Greater than two weeks  Affect: Appropriate; Congruent  Thought Process  Thought Processes: Disorganized  Descriptions of Associations:Tangential  Orientation:Full (Time, Place and Person)  Thought Content:Tangential  History of Schizophrenia/Schizoaffective disorder:Yes  Duration of Psychotic Symptoms:Greater than six months  Hallucinations:Hallucinations: None Description of Auditory Hallucinations: None  Ideas of Reference:None  Suicidal Thoughts:Suicidal Thoughts: No  Homicidal Thoughts:Homicidal Thoughts: No  Sensorium  Memory: Immediate Poor; Recent Poor  Judgment: Impaired  Insight: Poor  Executive Functions  Concentration: Fair  Attention  Span: Fair  Recall: Fair  Fund of Knowledge: Poor  Language: Fair  Psychomotor Activity  Psychomotor Activity: Psychomotor Activity: Normal  Assets  Assets: Resilience; Physical Health  Sleep  Sleep: Sleep: Good Number of Hours of Sleep: 8.5  Physical Exam: Physical Exam Vitals and nursing note reviewed.  HENT:     Head: Normocephalic.     Nose: Nose normal.     Mouth/Throat:     Mouth: Mucous membranes are moist.     Pharynx: Oropharynx is clear.  Eyes:     Extraocular Movements: Extraocular movements intact.  Cardiovascular:     Rate and Rhythm: Normal rate.     Pulses: Normal pulses.  Pulmonary:     Effort: Pulmonary effort is normal.  Abdominal:     Comments: Deferred  Genitourinary:    Comments: Deferred Musculoskeletal:        General: Normal range of motion.  Skin:    General: Skin is warm.  Neurological:     General: No focal deficit present.     Mental Status: He is alert and oriented to person, place, and time.  Psychiatric:        Mood and Affect: Mood normal.        Behavior: Behavior normal.    Review of Systems  Constitutional:  Negative for chills and fever.  HENT:  Negative for sore throat.   Eyes:  Negative for blurred vision.  Respiratory:  Negative for cough, sputum production, shortness of breath and wheezing.   Cardiovascular:  Negative for chest pain and palpitations.  Gastrointestinal:  Negative for abdominal pain, constipation, diarrhea, heartburn, nausea and vomiting.  Genitourinary:  Negative for dysuria, frequency and urgency.  Musculoskeletal:  Negative for back pain, falls, joint pain, myalgias and neck pain.  Skin:  Negative for itching and rash.  Neurological:  Negative for dizziness, tingling, tremors and headaches.  Endo/Heme/Allergies:  See allergy listing  Psychiatric/Behavioral:  Negative for depression, memory loss and suicidal ideas. The patient is not nervous/anxious and does not have insomnia.     Blood pressure (!) 140/86, pulse 86, temperature 97.6 F (36.4 C), temperature source Oral, resp. rate 16, height 5\' 8"  (1.727 m), weight 74 kg, SpO2 100%. Body mass index is 24.81 kg/m.  Mental Status Per Nursing Assessment::   On Admission:  NA  Demographic Factors:  Adolescent or young adult, Low socioeconomic status, and Unemployed  Loss Factors: Decrease in vocational status and Financial problems/change in socioeconomic status  Historical Factors: Impulsivity  Risk Reduction Factors:   Living with another person, especially a relative, Positive social support, Positive therapeutic relationship, and Positive coping skills or problem solving skills  Continued Clinical Symptoms:  Alcohol/Substance Abuse/Dependencies Schizophrenia:   Paranoid or undifferentiated type Personality Disorders:   Comorbid depression More than one psychiatric diagnosis Unstable or Poor Therapeutic Relationship Previous Psychiatric Diagnoses and Treatments  Cognitive Features That Contribute To Risk:  None    Suicide Risk:  Mild: There are no identifiable plans, no associated intent, mild dysphoria and related symptoms, good self-control (both objective and subjective assessment), few other risk factors, and identifiable protective factors, including available and accessible social support.   Follow-up Information     [facility]. Call.   Why: You may call this provider for a hospital follow up appointment, to obtain therapy and medication management services. Contact information: [address]  [address] [place] [address] [phone]         [facility]. Call.   Why: Please follow up with this provider for ACTT services. Contact information: [address] [place] [address] [phone]                Plan Of Care/Follow-up recommendations:  Discharge Recommendations:  The patient is being discharged to home. Patient is to take his discharge  medications as ordered.  See follow up above. We recommend that he participates in individual therapy to target uncontrollable agitation and substance abuse.  We recommend that he participates in therapy to target personal conflict, to improve communication skills and conflict resolution skills. Patient is to initiate/implement a contingency based behavioral model to address his behavior. We recommend that he gets AIMS scale, height, weight, blood pressure, fasting lipid panel, fasting blood sugar in three months from discharge if he's on atypical antipsychotics.  Patient will benefit from monitoring of recurrent suicidal ideation since patient is on antidepressant medication. The patient should abstain from all illicit substances and alcohol. If the patient's symptoms worsen or do not continue to improve or if the patient becomes actively suicidal or homicidal then it is recommended that the patient return to the closest hospital emergency room or call 911 for further evaluation and treatment. National Suicide Prevention Lifeline 1800-SUICIDE or 336-533-3811. Please follow up with your primary medical doctor for all other medical needs.  The patient has been educated on the possible side effects to medications and she/her guardian is to contact a medical professional and inform outpatient provider of any new side effects of medication. He is to take regular diet and activity as tolerated.  Will benefit from moderate daily exercise. Patient and Family was educated about removing/locking any firearms, medications or dangerous products from the home.  Activity:  As tolerated Diet:  Regular Diet   Cecilie Lowers, FNP 03/03/2023, 10:32 AM

## 2023-03-03 NOTE — Telephone Encounter (Signed)
Pharmacy Patient Advocate Encounter  Received notification from [facility] that Prior Authorization for Abilify Roderic Palau has been APPROVED from 03/03/2023 to 03/02/2024. Ran test claim, Copay is $4.00. This test claim was processed through [facility]- copay amounts may vary at other pharmacies due to pharmacy/plan contracts, or as the patient moves through the different stages of their insurance plan.   PA #/Case ID/Reference #: NA

## 2023-03-03 NOTE — Progress Notes (Signed)
Pt discharged to lobby. Pt was stable and appreciative at that time. All papers, samples and prescriptions were given and valuables returned. Verbal understanding expressed. Denies SI/HI and A/VH. Pt given opportunity to express concerns and ask questions.  

## 2023-03-03 NOTE — Group Note (Signed)
Recreation Therapy Group Note   Group Topic:Leisure Education  Group Date: 03/03/2023 Start Time: 1005 End Time: 1035 Facilitators: Everardo Voris-McCall, LRT,CTRS Location: [address] Day[address]    Group Topic: Leisure Education   Goal Area(s) Addresses:  Patient will successfully demonstrate knowledge of leisure and recreation interests. Patient will successfully identify benefits of leisure participation.  Patient will verbalize appropriate recreation activities to use post discharge.   Intervention: Music Trivia   Group Description: LRT facilitated a competitive group game of 90s/2000s music trivia. Patients were asked to answer trivia questions about music artist and lyrics. The person with the most points at the end of the game won.   Education:  Leisure Programme researcher, broadcasting/film/video, Building control surveyor   Education Outcome: Acknowledges education    Affect/Mood: Flat   Participation Level: Moderate   Participation Quality: Moderate Cues   Behavior: Nonchalant   Speech/Thought Process: Delusional   Insight: Lacking   Judgement: Lacking    Modes of Intervention: Competitive Play   Patient Response to Interventions:  Receptive   Education Outcome:  In group clarification offered    Clinical Observations/Individualized Feedback: Pt was quiet and focused on other things in the beginning. Pt then began to get more involved with the activity. Pt gave random answers such as being horny or smoking weed, which had nothing to do who the questions asked during group. Pt had to be redirected numerous times due to his answers.    Plan: Continue to engage patient in RT group sessions 2-3x/week.   [name], LRT,CTRS  03/03/2023 12:09 PM

## 2023-03-03 NOTE — Telephone Encounter (Signed)
Pharmacy Patient Advocate Encounter   Received notification from Inpatient request that prior authorization for Abilify Maintena 400MG  syringes is required/requested.   Insurance verification completed.   The patient is insured through [facility] .   Per test claim: PA required; PA submitted to above mentioned insurance via CoverMyMeds Key/confirmation #/EOC [facility] Status is pending

## 2023-03-03 NOTE — Progress Notes (Signed)
  [facility] Adult Case Management Discharge Plan :  Will you be returning to the same living situation after discharge:  Yes,  pt will be returning home with mother [name] [phone] At discharge, do you have transportation home?: Yes,  pt will be transported by [facility] Do you have the ability to pay for your medications: Yes,  pt has active medical coverage  Release of information consent forms completed and in the chart;  Patient's signature needed at discharge.  Patient to Follow up at:  Follow-up Information     [facility]. Call.   Why: You may call this provider for a hospital follow up appointment, to obtain therapy and medication management services. Contact information: [address]  [address] [place] [address] [phone]         [facility]. Call.   Why: Please follow up with this provider for ACTT services. Contact information: [address] [place] [address] [phone]                 Next level of care provider has access to [facility] Link:no  Safety Planning and Suicide Prevention discussed: Yes,  Safety planning completed with mother     Has patient been referred to the Quitline?: Patient refused referral for treatment  Patient has been referred for addiction treatment: Patient refused referral for treatment.  Rishik Tubby, Candace Cruise, LCSWA 03/03/2023, 2:48 PM

## 2023-03-31 ENCOUNTER — Encounter (HOSPITAL_COMMUNITY): Payer: Self-pay | Admitting: Emergency Medicine

## 2023-03-31 ENCOUNTER — Other Ambulatory Visit: Payer: Self-pay

## 2023-03-31 ENCOUNTER — Emergency Department (HOSPITAL_COMMUNITY)
Admission: EM | Admit: 2023-03-31 | Discharge: 2023-04-01 | Disposition: A | Discharge status: Other Acute Inpatient Hospital | Payer: MEDICAID | Attending: Emergency Medicine | Admitting: Emergency Medicine

## 2023-03-31 DIAGNOSIS — F29 Unspecified psychosis not due to a substance or known physiological condition: Secondary | ICD-10-CM | POA: Insufficient documentation

## 2023-03-31 DIAGNOSIS — Z046 Encounter for general psychiatric examination, requested by authority: Secondary | ICD-10-CM

## 2023-03-31 DIAGNOSIS — Z91010 Allergy to peanuts: Secondary | ICD-10-CM | POA: Diagnosis not present

## 2023-03-31 DIAGNOSIS — R451 Restlessness and agitation: Secondary | ICD-10-CM | POA: Insufficient documentation

## 2023-03-31 DIAGNOSIS — Z79899 Other long term (current) drug therapy: Secondary | ICD-10-CM | POA: Diagnosis not present

## 2023-03-31 DIAGNOSIS — F23 Brief psychotic disorder: Secondary | ICD-10-CM | POA: Diagnosis not present

## 2023-03-31 DIAGNOSIS — R456 Violent behavior: Secondary | ICD-10-CM | POA: Diagnosis not present

## 2023-03-31 DIAGNOSIS — Z91040 Latex allergy status: Secondary | ICD-10-CM | POA: Diagnosis not present

## 2023-03-31 LAB — CBC WITH DIFFERENTIAL/PLATELET
Abs Immature Granulocytes: 0.02 10*3/uL (ref 0.00–0.07)
Basophils Absolute: 0 10*3/uL (ref 0.0–0.1)
Basophils Relative: 0 %
Eosinophils Absolute: 0.5 10*3/uL (ref 0.0–0.5)
Eosinophils Relative: 7 %
HCT: 41 % (ref 39.0–52.0)
Hemoglobin: 13.1 g/dL (ref 13.0–17.0)
Immature Granulocytes: 0 %
Lymphocytes Relative: 36 %
Lymphs Abs: 2.5 10*3/uL (ref 0.7–4.0)
MCH: 29.9 pg (ref 26.0–34.0)
MCHC: 32 g/dL (ref 30.0–36.0)
MCV: 93.6 fL (ref 80.0–100.0)
Monocytes Absolute: 0.5 10*3/uL (ref 0.1–1.0)
Monocytes Relative: 7 %
Neutro Abs: 3.5 10*3/uL (ref 1.7–7.7)
Neutrophils Relative %: 50 %
Platelets: 200 10*3/uL (ref 150–400)
RBC: 4.38 MIL/uL (ref 4.22–5.81)
RDW: 13 % (ref 11.5–15.5)
WBC: 7 10*3/uL (ref 4.0–10.5)
nRBC: 0 % (ref 0.0–0.2)

## 2023-03-31 LAB — COMPREHENSIVE METABOLIC PANEL
ALT: 12 U/L (ref 0–44)
AST: 21 U/L (ref 15–41)
Albumin: 3.8 g/dL (ref 3.5–5.0)
Alkaline Phosphatase: 55 U/L (ref 38–126)
Anion gap: 6 (ref 5–15)
BUN: 11 mg/dL (ref 6–20)
CO2: 26 mmol/L (ref 22–32)
Calcium: 8.8 mg/dL — ABNORMAL LOW (ref 8.9–10.3)
Chloride: 107 mmol/L (ref 98–111)
Creatinine, Ser: 1.11 mg/dL (ref 0.61–1.24)
GFR, Estimated: >60 mL/min (ref >60)
Glucose, Bld: 110 mg/dL — ABNORMAL HIGH (ref 70–99)
Potassium: 4.3 mmol/L (ref 3.5–5.1)
Sodium: 139 mmol/L (ref 135–145)
Total Bilirubin: 0.4 mg/dL (ref 0.0–1.2)
Total Protein: 6.9 g/dL (ref 6.5–8.1)

## 2023-03-31 LAB — RAPID URINE DRUG SCREEN, HOSP PERFORMED
Amphetamines: NOT DETECTED
Barbiturates: NOT DETECTED
Benzodiazepines: NOT DETECTED
Cocaine: NOT DETECTED
Opiates: NOT DETECTED
Tetrahydrocannabinol: POSITIVE — AB

## 2023-03-31 LAB — ETHANOL: Alcohol, Ethyl (B): <10 mg/dL (ref <10)

## 2023-03-31 LAB — VALPROIC ACID LEVEL: Valproic Acid Lvl: 53 ug/mL (ref 50.0–100.0)

## 2023-03-31 NOTE — ED Notes (Addendum)
Pt with flight of ideas during triage, easily distracted, states he is here for "better medication", reports feeling paranoid, denies HI/SI

## 2023-03-31 NOTE — ED Triage Notes (Signed)
Pt arrives w/ [employer] w/ IVC papers from [employer]. IVC papers state pt is schizophrenic, having disorganized thoughts, vandalizing the home.

## 2023-03-31 NOTE — BH Assessment (Signed)
Clinician spoke to with IRIS to complete pt's TTS assessment. Clinician provided pt's name, MRN, location, age, room number and provider's name Secure message completed.    Iris coordinator to update secure chat when assessment time and provider are assigned.   Redmond Pulling, MS, [facility], [facility] Triage Specialist [phone]

## 2023-03-31 NOTE — ED Provider Notes (Signed)
[place] EMERGENCY DEPARTMENT AT [facility] Provider Note   CSN: [id number] Arrival date & time: 03/31/23  1926     History  Chief Complaint  Patient presents with   IVC    [name] Love is a 28 y.o. male.  HPI Patient presents to the ED with law enforcement today complaining of a 1 day history of agitation.  While enforcement state IVC paperwork has already been filled out by counselor.  Stating that he has become agitated and aggressive and medication not appropriately treating current condition.  Law enforcement also says that he has not seemed agitated with them and has not even needed handcuffs.   Patient presents with tangential thinking and is difficult to answer questions.  Home Medications Prior to Admission medications   Medication Sig Start Date End Date Taking? Authorizing Provider  ARIPiprazole (ABILIFY) 10 MG tablet Take 1 tablet (10 mg total) by mouth daily for 10 days. 03/04/23 03/14/23  Love, [name] Donath, MD  ARIPiprazole ER (ABILIFY MAINTENA) 400 MG SRER injection Inject 2 mLs (400 mg total) into the muscle every 28 (twenty-eight) days. Next dose is due to be administered on 03-31-2023. 03/31/23   [name] Inches, MD  benztropine (COGENTIN) 1 MG tablet Take 1 tablet (1 mg total) by mouth 2 (two) times daily. 03/03/23   Love, [name] Donath, MD  divalproex (DEPAKOTE ER) 250 MG 24 hr tablet Take 3 tablets (750 mg total) by mouth every 12 (twelve) hours. 03/03/23   Love, [name] Donath, MD  haloperidol (HALDOL) 20 MG tablet Take 1 tablet (20 mg total) by mouth at bedtime. 03/04/23 04/03/23  Love, [name] Donath, MD  hydrOXYzine (ATARAX) 25 MG tablet Take 1 tablet (25 mg total) by mouth 3 (three) times daily as needed for anxiety. 03/03/23   Love, [name] Donath, MD  traZODone (DESYREL) 50 MG tablet Take 1 tablet (50 mg total) by mouth at bedtime as needed for sleep. 03/03/23   Love, [name] Donath, MD  Vitamin D, Ergocalciferol, (DRISDOL) 1.25 MG (50000 UNIT) CAPS  capsule Take 1 capsule (50,000 Units total) by mouth every 7 (seven) days. 03/09/23   Love, [name] Donath, MD      Allergies    Chocolate, Latex, Other, Peanuts [peanut oil], and Tape    Review of Systems   Review of Systems  Psychiatric/Behavioral:  Negative for agitation, self-injury and suicidal ideas.   All other systems reviewed and are negative.   Physical Exam Updated Vital Signs BP (!) 141/97 (BP Location: Left Arm)   Pulse 83   Temp 98.6 F (37 C) (Oral)   Resp 17   Ht 5\' 11"  (1.803 m)   Wt 74.8 kg   SpO2 100%   BMI 23.01 kg/m  Physical Exam Vitals and nursing note reviewed.  Constitutional:      General: He is not in acute distress.    Appearance: Normal appearance. He is ill-appearing.  HENT:     Head: Normocephalic and atraumatic.     Nose: Nose normal.     Mouth/Throat:     Mouth: Mucous membranes are moist.     Pharynx: Oropharynx is clear. No oropharyngeal exudate or posterior oropharyngeal erythema.  Eyes:     Extraocular Movements: Extraocular movements intact.     Conjunctiva/sclera: Conjunctivae normal.  Cardiovascular:     Rate and Rhythm: Normal rate and regular rhythm.     Pulses: Normal pulses.     Heart sounds: Normal heart sounds. No murmur heard.    No friction rub. No gallop.  Pulmonary:  Effort: Pulmonary effort is normal. No respiratory distress.     Breath sounds: Normal breath sounds.  Abdominal:     General: Abdomen is flat.     Palpations: Abdomen is soft.     Tenderness: There is no abdominal tenderness.  Musculoskeletal:     Cervical back: Normal range of motion. No rigidity or tenderness.  Skin:    General: Skin is warm and dry.     Coloration: Skin is not jaundiced or pale.     Findings: No erythema.  Neurological:     General: No focal deficit present.     Mental Status: He is alert. Mental status is at baseline.     Motor: No weakness.     Coordination: Coordination normal.     Gait: Gait normal.  Psychiatric:         Mood and Affect: Mood normal.     ED Results / Procedures / Treatments   Labs (all labs ordered are listed, but only abnormal results are displayed) Labs Reviewed  COMPREHENSIVE METABOLIC PANEL - Abnormal; Notable for the following components:      Result Value   Glucose, Bld 110 (*)    Calcium 8.8 (*)    All other components within normal limits  ETHANOL  CBC WITH DIFFERENTIAL/PLATELET  VALPROIC ACID LEVEL  RAPID URINE DRUG SCREEN, HOSP PERFORMED    EKG None  Radiology No results found.  Procedures Procedures    Medications Ordered in ED Medications - No data to display  ED Course/ Medical Decision Making/ A&P   {                               Medical Decision Making  This patient is a 28 year old male who presents to the ED for concern of IVC-.   Differential diagnoses prior to evaluation: The emergent differential diagnosis includes, but is not limited to, acute psychosis, drug-induced intoxication, metabolite abnormality. This is not an exhaustive differential.   Past Medical History / Co-morbidities / Social History: Schizophrenia, paranoid, aggression, cocaine use disorder, borderline intellectual functioning, hyperprolactinemia.  Additional history: Chart reviewed. Pertinent results include: Recently seen on 02/14/2023 for same condition.  He was discharged with Haldol, Abilify, Invega  Lab Tests/Imaging studies: I personally interpreted labs/imaging and the pertinent results include:   CMP unremarkable Ethanol unremarkable CBC unremarkable Urine drug screen pending Valproic acid 53   Medications: No medications needed for this visit.  I have reviewed the patients home medicines and have made adjustments as needed.  ED Course:    Patient is a 28 year old male presents to the ED today with law enforcement for IVC.  Patient is at [facility].  He had previously been at [facility] where he was undergoing treatment where counselor states that he became  agitated believing that medication had not been appropriately treating his condition.  For this reason counselor filled out IVC due to risk to others.  Patient appeared to to be tangential and thinking this was difficult to get any further questioning of him.  However he was calm and did not seem agitated.  He was able to answer questions but became tangential thinking.  Labs were then done and showed to be unremarkable.  Vital signs are also stable and have remained stable throughout his time in the ER.  I believe this patient is medically cleared to be evaluated by TTC.  Disposition: After consideration of the diagnostic results and the patients  response to treatment, I feel that the patient would benefit from being evaluated by TTC  Final Clinical Impression(s) / ED Diagnoses Final diagnoses:  Involuntary commitment    Rx / DC Orders ED Discharge Orders     None         Lavonia Drafts 03/31/23 2151    Benjiman Core, MD 03/31/23 2315

## 2023-03-31 NOTE — ED Notes (Signed)
Pt dressed out in burgundy scrubs and anti-slip socks. Pt belongings placed in bag with label behind triage nurses station.

## 2023-04-01 DIAGNOSIS — F23 Brief psychotic disorder: Secondary | ICD-10-CM

## 2023-04-01 MED ORDER — HALOPERIDOL 5 MG PO TABS: 5.0000 mg | ORAL_TABLET | Freq: Two times a day (BID) | ORAL | Status: DC

## 2023-04-01 MED ORDER — DIVALPROEX SODIUM 500 MG PO DR TAB: 750.0000 mg | DELAYED_RELEASE_TABLET | Freq: Every day | ORAL | Status: DC

## 2023-04-01 MED ORDER — BENZTROPINE MESYLATE 0.5 MG PO TABS: 0.5000 mg | ORAL_TABLET | Freq: Two times a day (BID) | ORAL | Status: DC | PRN

## 2023-04-01 NOTE — ED Notes (Signed)
[facility] called and asked about this patient    she said she would be in contact about acceptance

## 2023-04-01 NOTE — Progress Notes (Addendum)
LCSW Progress Note  [id number]   [name] Love  04/01/2023  1:23 PM  Description:   Inpatient Psychiatric Referral  Patient was recommended inpatient per [facility], PMHNP  There are no available beds at [facility], per [facility] Rosey Bath RN. Patient was referred to the following out of network facilities:   Destination  Service Provider Address Phone Fax  [facility] [address] [place] [address] [phone]  [facility] [address] [place] [address] [phone]  [facility] [address] [place], [place] [address] [phone]  [facility] [address] [place] [address] [phone]  [facility] [address] [place] [address] [phone]  [facility] [address] [place] [address] [phone]  [facility] [address] [place] [address] [phone]  [facility] [address] [place] [address] [phone]  [facility] [address], [place] [address] [phone]  [facility] [address] [place] [address] [phone]  [facility] [address] [place] [address] [phone]  [facility] EFAX [address] [place] [phone]      Situation ongoing, CSW to continue following and update chart as more information becomes available.      [name] Love, MSW, LCSW  04/01/2023 1:23 PM

## 2023-04-01 NOTE — ED Notes (Signed)
Breakfast tray given. °

## 2023-04-01 NOTE — ED Provider Notes (Signed)
Emergency Medicine Observation Re-evaluation Note  [name] Love is a 28 y.o. male, seen on rounds today.  Pt initially presented to the ED for complaints of IVC Currently, the patient is resting.  Physical Exam  BP 133/75 (BP Location: Left Arm)   Pulse 76   Temp 97.7 F (36.5 C) (Oral)   Resp 18   Ht 1.803 m (5\' 11" )   Wt 74.8 kg   SpO2 100%   BMI 23.01 kg/m  Physical Exam General: No acute distress Cardiac: Rate Lungs: Normal effort Psych: Deferred  ED Course / MDM  EKG:EKG Interpretation Date/Time:  Thursday March 31 2023 22:11:37 EST Ventricular Rate:  63 PR Interval:  145 QRS Duration:  88 QT Interval:  374 QTC Calculation: 383 R Axis:   74  Text Interpretation: Sinus rhythm Anteroseptal infarct, old Confirmed by Benjiman Core [phone]) on 03/31/2023 11:14:51 PM  I have reviewed the labs performed to date as well as medications administered while in observation.  Recent changes in the last 24 hours include initial ED screening..  Plan  Current plan is for psychiatric evaluation that is currently still pending.    Linwood Dibbles, MD 04/01/23 [phone]

## 2023-04-01 NOTE — Progress Notes (Signed)
Pt has been accepted to [facility] on 04/01/2023 Bed assignment: 700 Unit   Pt meets inpatient criteria per: Alona Bene, PMHNP   Attending Physician will be: Dr. Sherrian Divers MD   Report can be called to: [phone]  Pt can arrive ASAP   Care Team Notified: Alona Bene, PMHNP, [facility] paramedic, Jacquelynn Cree NT,   [place] Alferd Obryant LCSW-A   04/01/2023 1:32 PM

## 2023-04-01 NOTE — ED Notes (Signed)
Sheriffs dept called for transport to [facility]
